# Patient Record
Sex: Male | Born: 1950
Health system: Southern US, Community
[De-identification: ages and names within clinical notes are randomized; demographics above are authoritative.]

## PROBLEM LIST (undated history)

## (undated) DIAGNOSIS — D72819 Decreased white blood cell count, unspecified: Secondary | ICD-10-CM

## (undated) DIAGNOSIS — K279 Peptic ulcer, site unspecified, unspecified as acute or chronic, without hemorrhage or perforation: Secondary | ICD-10-CM

## (undated) DIAGNOSIS — M1990 Unspecified osteoarthritis, unspecified site: Secondary | ICD-10-CM

## (undated) DIAGNOSIS — J40 Bronchitis, not specified as acute or chronic: Secondary | ICD-10-CM

## (undated) DIAGNOSIS — K589 Irritable bowel syndrome without diarrhea: Secondary | ICD-10-CM

## (undated) DIAGNOSIS — N200 Calculus of kidney: Secondary | ICD-10-CM

## (undated) DIAGNOSIS — G4730 Sleep apnea, unspecified: Secondary | ICD-10-CM

## (undated) DIAGNOSIS — Z87891 Personal history of nicotine dependence: Secondary | ICD-10-CM

## (undated) HISTORY — PX: CYSTOGRAPHY: SUR363

## (undated) HISTORY — DX: Personal history of nicotine dependence: Z87.891

## (undated) HISTORY — PX: ROTATOR CUFF REPAIR: SHX139

## (undated) HISTORY — DX: Calculus of kidney: N20.0

## (undated) HISTORY — PX: REFRACTIVE SURGERY: SHX103

## (undated) HISTORY — PX: APPENDECTOMY: SHX54

## (undated) HISTORY — DX: Decreased white blood cell count, unspecified: D72.819

## (undated) HISTORY — DX: Irritable bowel syndrome, unspecified: K58.9

---

## 2004-02-15 ENCOUNTER — Encounter: Admission: RE | Admit: 2004-02-15 | Discharge: 2004-02-15 | Payer: Self-pay | Admitting: Orthopedic Surgery

## 2004-03-08 ENCOUNTER — Encounter: Admission: RE | Admit: 2004-03-08 | Discharge: 2004-03-08 | Payer: Self-pay | Admitting: Orthopedic Surgery

## 2004-03-22 ENCOUNTER — Encounter: Admission: RE | Admit: 2004-03-22 | Discharge: 2004-03-22 | Payer: Self-pay | Admitting: Orthopedic Surgery

## 2004-10-15 ENCOUNTER — Encounter: Admission: RE | Admit: 2004-10-15 | Discharge: 2004-10-15 | Payer: Self-pay | Admitting: Neurosurgery

## 2004-12-04 ENCOUNTER — Ambulatory Visit: Payer: Self-pay | Admitting: Internal Medicine

## 2004-12-04 ENCOUNTER — Ambulatory Visit (HOSPITAL_COMMUNITY): Admission: RE | Admit: 2004-12-04 | Discharge: 2004-12-04 | Payer: Self-pay | Admitting: Internal Medicine

## 2005-06-02 ENCOUNTER — Ambulatory Visit (HOSPITAL_COMMUNITY): Admission: RE | Admit: 2005-06-02 | Discharge: 2005-06-02 | Payer: Self-pay | Admitting: Family Medicine

## 2006-01-30 ENCOUNTER — Emergency Department (HOSPITAL_COMMUNITY): Admission: EM | Admit: 2006-01-30 | Discharge: 2006-01-30 | Payer: Self-pay | Admitting: Emergency Medicine

## 2006-06-02 ENCOUNTER — Ambulatory Visit (HOSPITAL_COMMUNITY): Admission: RE | Admit: 2006-06-02 | Discharge: 2006-06-02 | Payer: Self-pay | Admitting: Family Medicine

## 2006-09-17 ENCOUNTER — Ambulatory Visit (HOSPITAL_COMMUNITY): Admission: RE | Admit: 2006-09-17 | Discharge: 2006-09-17 | Payer: Self-pay | Admitting: Family Medicine

## 2007-10-19 ENCOUNTER — Ambulatory Visit (HOSPITAL_COMMUNITY): Admission: RE | Admit: 2007-10-19 | Discharge: 2007-10-19 | Payer: Self-pay | Admitting: Family Medicine

## 2007-11-03 ENCOUNTER — Ambulatory Visit: Payer: Self-pay | Admitting: Cardiovascular Disease

## 2007-11-09 ENCOUNTER — Ambulatory Visit (HOSPITAL_COMMUNITY): Admission: RE | Admit: 2007-11-09 | Discharge: 2007-11-09 | Payer: Self-pay | Admitting: Cardiovascular Disease

## 2007-11-09 ENCOUNTER — Encounter: Payer: Self-pay | Admitting: Cardiovascular Disease

## 2007-11-09 ENCOUNTER — Ambulatory Visit: Payer: Self-pay | Admitting: Cardiology

## 2009-03-01 DIAGNOSIS — R079 Chest pain, unspecified: Secondary | ICD-10-CM | POA: Insufficient documentation

## 2009-09-07 ENCOUNTER — Encounter (HOSPITAL_COMMUNITY): Admission: RE | Admit: 2009-09-07 | Discharge: 2009-10-07 | Payer: Self-pay | Admitting: Oncology

## 2009-09-07 ENCOUNTER — Ambulatory Visit (HOSPITAL_COMMUNITY): Payer: Self-pay | Admitting: Oncology

## 2009-11-27 ENCOUNTER — Ambulatory Visit (HOSPITAL_COMMUNITY): Admission: RE | Admit: 2009-11-27 | Discharge: 2009-11-27 | Payer: Self-pay | Admitting: Internal Medicine

## 2009-12-06 ENCOUNTER — Ambulatory Visit (HOSPITAL_COMMUNITY): Payer: Self-pay | Admitting: Oncology

## 2009-12-06 ENCOUNTER — Encounter (HOSPITAL_COMMUNITY): Admission: RE | Admit: 2009-12-06 | Discharge: 2010-01-05 | Payer: Self-pay | Admitting: Oncology

## 2010-03-07 ENCOUNTER — Encounter (HOSPITAL_COMMUNITY): Admission: RE | Admit: 2010-03-07 | Discharge: 2010-03-29 | Payer: Self-pay | Admitting: Oncology

## 2010-03-07 ENCOUNTER — Ambulatory Visit (HOSPITAL_COMMUNITY): Payer: Self-pay | Admitting: Oncology

## 2010-09-12 LAB — DIFFERENTIAL
Basophils Relative: 1 % (ref 0–1)
Lymphs Abs: 0.8 10*3/uL (ref 0.7–4.0)
Monocytes Absolute: 0.4 10*3/uL (ref 0.1–1.0)
Monocytes Relative: 10 % (ref 3–12)
Neutro Abs: 2.4 10*3/uL (ref 1.7–7.7)

## 2010-09-12 LAB — CBC
HCT: 42.6 % (ref 39.0–52.0)
Hemoglobin: 14.9 g/dL (ref 13.0–17.0)
MCH: 33.8 pg (ref 26.0–34.0)
MCHC: 34.8 g/dL (ref 30.0–36.0)
RBC: 4.39 MIL/uL (ref 4.22–5.81)

## 2010-09-16 LAB — CBC
Hemoglobin: 15.9 g/dL (ref 13.0–17.0)
MCHC: 34.1 g/dL (ref 30.0–36.0)
MCV: 95.1 fL (ref 78.0–100.0)
RDW: 12.8 % (ref 11.5–15.5)

## 2010-09-16 LAB — DIFFERENTIAL
Basophils Absolute: 0.1 10*3/uL (ref 0.0–0.1)
Basophils Relative: 1 % (ref 0–1)
Eosinophils Absolute: 0.1 10*3/uL (ref 0.0–0.7)
Eosinophils Relative: 3 % (ref 0–5)
Monocytes Absolute: 0.3 10*3/uL (ref 0.1–1.0)
Monocytes Relative: 8 % (ref 3–12)
Neutro Abs: 2.8 10*3/uL (ref 1.7–7.7)

## 2010-09-22 LAB — CBC
MCHC: 34.6 g/dL (ref 30.0–36.0)
MCV: 98.4 fL (ref 78.0–100.0)
Platelets: 220 10*3/uL (ref 150–400)
RDW: 12.5 % (ref 11.5–15.5)

## 2010-09-22 LAB — DIFFERENTIAL
Neutro Abs: 3.7 10*3/uL (ref 1.7–7.7)
Neutrophils Relative %: 79 % — ABNORMAL HIGH (ref 43–77)

## 2010-09-22 LAB — HIV ANTIBODY (ROUTINE TESTING W REFLEX): HIV: NONREACTIVE

## 2010-10-18 ENCOUNTER — Other Ambulatory Visit (HOSPITAL_COMMUNITY): Payer: Self-pay

## 2010-10-21 ENCOUNTER — Ambulatory Visit (HOSPITAL_COMMUNITY): Payer: Self-pay

## 2010-10-22 ENCOUNTER — Encounter (HOSPITAL_COMMUNITY): Payer: Managed Care, Other (non HMO) | Attending: Oncology

## 2010-10-22 DIAGNOSIS — Z79899 Other long term (current) drug therapy: Secondary | ICD-10-CM | POA: Insufficient documentation

## 2010-10-22 DIAGNOSIS — D72819 Decreased white blood cell count, unspecified: Secondary | ICD-10-CM

## 2010-10-22 DIAGNOSIS — F411 Generalized anxiety disorder: Secondary | ICD-10-CM | POA: Insufficient documentation

## 2010-10-22 DIAGNOSIS — E291 Testicular hypofunction: Secondary | ICD-10-CM | POA: Insufficient documentation

## 2010-10-24 ENCOUNTER — Encounter (HOSPITAL_COMMUNITY): Payer: Managed Care, Other (non HMO)

## 2010-10-24 DIAGNOSIS — D72819 Decreased white blood cell count, unspecified: Secondary | ICD-10-CM

## 2010-11-06 ENCOUNTER — Emergency Department (HOSPITAL_COMMUNITY): Payer: Managed Care, Other (non HMO)

## 2010-11-06 ENCOUNTER — Emergency Department (HOSPITAL_COMMUNITY)
Admission: EM | Admit: 2010-11-06 | Discharge: 2010-11-06 | Disposition: A | Discharge status: Home / Self Care | Payer: Managed Care, Other (non HMO) | Attending: Emergency Medicine | Admitting: Emergency Medicine

## 2010-11-06 DIAGNOSIS — IMO0002 Reserved for concepts with insufficient information to code with codable children: Secondary | ICD-10-CM | POA: Insufficient documentation

## 2010-11-06 DIAGNOSIS — Y998 Other external cause status: Secondary | ICD-10-CM | POA: Insufficient documentation

## 2010-11-06 DIAGNOSIS — X58XXXA Exposure to other specified factors, initial encounter: Secondary | ICD-10-CM | POA: Insufficient documentation

## 2010-11-06 DIAGNOSIS — H409 Unspecified glaucoma: Secondary | ICD-10-CM | POA: Insufficient documentation

## 2010-11-06 DIAGNOSIS — Y9317 Activity, water skiing and wake boarding: Secondary | ICD-10-CM | POA: Insufficient documentation

## 2010-11-12 NOTE — Procedures (Signed)
Joel Greene, Joel Greene                 ACCOUNT NO.:  [id number]   MEDICAL RECORD NO.:  [id number]          PATIENT TYPE:  OUT   LOCATION:  RAD                           FACILITY:  [facility]   PHYSICIAN:  Gerrit Friends. Dietrich Pates, MD, FACCDATE OF BIRTH:  10/09/1950   DATE OF PROCEDURE:  11/09/2007  DATE OF DISCHARGE:  11/09/2007                                ECHOCARDIOGRAM   CLINICAL DATA:  This is a 60 year old gentleman with chest pain.   1. Treadmill exercise performed to a workload of 12.8 METS and a heart      rate of 171, 106% of age - predicted maximum.  Exercise      discontinued due to dyspnea and fatigue; no chest pain reported.  2. Blood pressure increased from a resting value of 140/85 to 180/80      during exercise and 190/80 in recovery, a normal response.  3. No arrhythmias noted.  4. EKG:  Normal sinus rhythm; slightly delayed R wave progression;      otherwise normal.  5. Stress EKG:  No significant change.  6. Baseline echocardiogram:  Normal left atrial size; normal right      ventricular size and function; normal left ventricular size and      function - no LVH.  Normal mitral valve with mild annular      calcification; normal aortic valve and aortic root.  7. Stress echocardiogram:  Marked decrease in cavity size with an      increase in contractility in all myocardial segments.   IMPRESSION:  Negative and adequate stress echocardiogram revealing  neither electrocardiographic nor echocardiographic evidence for  myocardial infarction or ischemia.  Other findings are as noted.      Gerrit Friends. Dietrich Pates, MD, [facility]  Electronically Signed     RMR/MEDQ  D:  11/10/2007  T:  11/11/2007  Job:  [id number]

## 2010-11-12 NOTE — Assessment & Plan Note (Signed)
[facility]                       [place] CARDIOLOGY OFFICE NOTE   Joel Greene, Joel Greene                        MRN:          [id number]  DATE:11/03/2007                            DOB:          08-12-1950    Joel Greene is a pleasant 60 year old patient referred by Joel Greene and Dr. Phillips Odor for chest pain.  The patient has no  previously-documented coronary history.  About a month ago he was  lifting about a 60-pound package over his head at [employer].  He  had severe chest pain.  He saw his physician.  He initially was treated  with over-the-counter medicines and about 2 weeks ago started Mobic.  The pain was clearly musculoskeletal.  It hurt almost continuously for 4  weeks.  The Mobic helped greatly and he is currently asymptomatic.   The patient has not had exertional chest pain outside of this.  He is a  fairly avid hunter and active and does not get exertional pain.  There  have been no palpitations, syncope.  He has not had a pleuritic  component.  There is been no history of PE or DVT.   He has not had a previous stress test but was referred here for one.   His cardiac risk factors are negative for smoking, diabetes, family  history.  I do not have a cholesterol status on him .  he is on no blood  pressure medicines.   The patient's review of systems is otherwise negative.   His past medical history is benign.  He has had bilateral rotator cuff  surgery by Dr. Thurston Hole.  He has had a previous appendectomy and  colonoscopy.   He has no known allergies.   He does not smoke or drink.   He takes no medicines outside of Xanax for sleep.   REVIEW OF SYSTEMS:  Otherwise remarkable for wearing glasses.  He has  seen Dr. Victorino Dike in the past for what sounds like some reflux but  has not seen him recently.   The patient is divorced.  He has an older daughter.  He is a grandfather  of a 18 year old.  He enjoys  hunting and water skiing.  He works at  [employer] as a Pensions consultant.   FAMILY HISTORY:  Remarkable for father dying of complications of knee  surgery, mother still being alive.   He denies any allergies.   EXAM:  Remarkable for a jovial, middle-aged white male in no distress.  He looks younger than his stated age.  Blood pressure is 130/80, weight 196, pulse 73 and regular, respiratory  rate 14.  HEENT:  Unremarkable.  Carotids normal without bruit.  No lymphadenopathy, thyromegaly or JVP  elevation.  LUNGS:  Clear with good diaphragmatic motion.  No wheezing.  S1 with a systolic ejection murmur.  PMI normal.  No pain to palpation  in the chest.  ABDOMEN:  Benign.  Bowel sounds positive.  No bruit.  No tenderness.  No  hepatosplenomegaly or hepatojugular reflux.  Distal pulses are intact.  No edema.  NEUROLOGIC: Nonfocal.  SKIN:  Warm and dry.  No muscular weakness.  He is status post bilateral rotator cuff surgery  and seems to have had some stitches in the left antecubital fossa.   IMPRESSION:  1. Chest pain, likely musculoskeletal.  Follow up stress echo.  I did      review his EKG today.  it was normal except for some minor lateral      T-wave changes.  2. Murmur.  Since the patient has had chest pain, I thought the best      test for him would be a stress echo.  Resting images will allow [place]      to interrogate the etiology of his systolic murmur.  3. Anxiety and sleep disorder.  Continue p.r.n. Xanax per Dr. Phillips Odor  4. Bilateral rotator cuff problems.  Follow up with Dr. Wyline Mood.      Continue range of motion exercises.   Overall, I think the patient will have a negative stress echo.  I  suspect his murmur is that of aortic sclerosis.  I will see him on a  p.r.n. basis if his stress echo is normal.     Theron Arista C. Eden Emms, MD, [facility]  Electronically Signed    PCN/MedQ  DD: 11/03/2007  DT: 11/03/2007  Job #: [id number]   cc:   [facility]

## 2010-11-15 NOTE — Op Note (Signed)
Joel Greene, Joel Greene                 ACCOUNT NO.:  [id number]   MEDICAL RECORD NO.:  [id number]          PATIENT TYPE:  AMB   LOCATION:  DAY                           FACILITY:  [facility]   PHYSICIAN:  Lionel December, M.D.    DATE OF BIRTH:  May 17, 1951   DATE OF PROCEDURE:  12/04/2004  DATE OF DISCHARGE:                                 OPERATIVE REPORT   PROCEDURE:  Colonoscopy with polypectomy.   INDICATION:  Brae is a 60 year old Caucasian male who is here for screening  colonoscopy. Family history is negative for colorectal carcinoma. Procedure  risks were reviewed with the patient, informed consent was obtained.   PREMEDICATION:  Demerol 50 milligrams IV, Versed 5 milligrams IV in divided  dose.   FINDINGS:  Procedure performed in endoscopy suite.  The patient's vital  signs and O2 sat were monitored during procedure and remained stable. The  patient was placed left lateral position. Rectal examination performed. No  abnormality noted on external or digital exam. Olympus videoscope was placed  in rectum and advanced under vision to sigmoid colon beyond. Preparation was  excellent except the ascending colon where he had thick stool coating the  mucosa but there did not appear to be any underlying lesion. Cecum was  identified by ileocecal valve. Blunt cecum was well seen although the mucosa  was coated with stool, it did not appear to have any polyp or mass.  As the  scope was withdrawn, colonic mucosa was carefully examined. There was a  small polyp at hepatic flexure which was cold snared. Another 7-8 mm polyp  was snared from sigmoid colon.  It had red hemorrhagic surface. This polyp  was retrieved for histologic examination. Mucosa rest of the sigmoid colon  and rectum was normal.  The scope was retroflexed to examine anorectal  junction and he had small hemorrhoids below the dentate line. Endoscope was  straightened and withdrawn. The patient tolerated the procedure well.   FINAL  DIAGNOSIS:  Small polyp was cold snared from hepatic flexure.  Another  7 to 8 mm polyp snared from sigmoid colon.  Small external hemorrhoids.   RECOMMENDATIONS:  Standard instructions given.  I will be contacting the  patient with biopsy results and further recommendations.       NR/MEDQ  D:  12/04/2004  T:  12/04/2004  Job:  [id number]   cc:   Ramon Dredge L. Juanetta Gosling, M.D.  [address]  [place]  [place] [address]  Fax: [phone]

## 2011-08-21 ENCOUNTER — Other Ambulatory Visit (HOSPITAL_COMMUNITY): Payer: Self-pay | Admitting: Urology

## 2011-08-21 DIAGNOSIS — R310 Gross hematuria: Secondary | ICD-10-CM

## 2011-08-26 ENCOUNTER — Ambulatory Visit (HOSPITAL_COMMUNITY)
Admission: RE | Admit: 2011-08-26 | Discharge: 2011-08-26 | Disposition: A | Discharge status: Home / Self Care | Payer: Managed Care, Other (non HMO) | Source: Ambulatory Visit | Attending: Urology | Admitting: Urology

## 2011-08-26 DIAGNOSIS — R310 Gross hematuria: Secondary | ICD-10-CM | POA: Insufficient documentation

## 2011-08-26 DIAGNOSIS — N200 Calculus of kidney: Secondary | ICD-10-CM | POA: Insufficient documentation

## 2011-08-26 MED ORDER — IOHEXOL 300 MG/ML  SOLN
100.0000 mL | Freq: Once | INTRAMUSCULAR | Status: AC | PRN
  Administered 2011-08-26: 100 mL via INTRAVENOUS

## 2011-10-20 ENCOUNTER — Encounter (HOSPITAL_COMMUNITY): Payer: Managed Care, Other (non HMO) | Attending: Oncology

## 2011-10-20 DIAGNOSIS — Z87442 Personal history of urinary calculi: Secondary | ICD-10-CM | POA: Insufficient documentation

## 2011-10-20 DIAGNOSIS — E291 Testicular hypofunction: Secondary | ICD-10-CM | POA: Insufficient documentation

## 2011-10-20 DIAGNOSIS — D72819 Decreased white blood cell count, unspecified: Secondary | ICD-10-CM

## 2011-10-20 DIAGNOSIS — H409 Unspecified glaucoma: Secondary | ICD-10-CM | POA: Insufficient documentation

## 2011-10-20 DIAGNOSIS — K589 Irritable bowel syndrome without diarrhea: Secondary | ICD-10-CM | POA: Insufficient documentation

## 2011-10-20 DIAGNOSIS — F411 Generalized anxiety disorder: Secondary | ICD-10-CM | POA: Insufficient documentation

## 2011-10-20 DIAGNOSIS — Z87891 Personal history of nicotine dependence: Secondary | ICD-10-CM | POA: Insufficient documentation

## 2011-10-20 LAB — DIFFERENTIAL
Eosinophils Absolute: 0.1 10*3/uL (ref 0.0–0.7)
Lymphs Abs: 0.7 10*3/uL (ref 0.7–4.0)
Monocytes Relative: 14 % — ABNORMAL HIGH (ref 3–12)
Neutrophils Relative %: 62 % (ref 43–77)

## 2011-10-20 LAB — CBC
Hemoglobin: 14.4 g/dL (ref 13.0–17.0)
MCH: 29.8 pg (ref 26.0–34.0)
MCV: 92.3 fL (ref 78.0–100.0)
RBC: 4.83 MIL/uL (ref 4.22–5.81)

## 2011-10-20 NOTE — Progress Notes (Signed)
Labs drawn today for cbc/diff 

## 2011-10-23 ENCOUNTER — Encounter (HOSPITAL_BASED_OUTPATIENT_CLINIC_OR_DEPARTMENT_OTHER): Payer: Managed Care, Other (non HMO) | Admitting: Oncology

## 2011-10-23 ENCOUNTER — Encounter (HOSPITAL_COMMUNITY): Payer: Self-pay | Admitting: Oncology

## 2011-10-23 VITALS — BP 139/80 | HR 87 | Temp 98.7°F | Wt 196.7 lb

## 2011-10-23 DIAGNOSIS — Z87891 Personal history of nicotine dependence: Secondary | ICD-10-CM

## 2011-10-23 DIAGNOSIS — D72819 Decreased white blood cell count, unspecified: Secondary | ICD-10-CM

## 2011-10-23 HISTORY — DX: Decreased white blood cell count, unspecified: D72.819

## 2011-10-23 HISTORY — DX: Personal history of nicotine dependence: Z87.891

## 2011-10-23 NOTE — Progress Notes (Signed)
[name]., MD, MD [address] [place] [address]  1. Leukopenia   2. History of tobacco abuse     CURRENT THERAPY: Observation  INTERVAL HISTORY: Joel Greene 60 y.o. male returns for  regular  visit for followup of leukopenia with a decrease in lymphocytes dating back to 1995. This is likely hereditary and will not require any intervention.  We've been following the patient's CBC for a few years. His white count remains very stable. His most recent laboratory work reveals a mild leukopenia with a normal differential. I personally reviewed and went over laboratory results with the patient.  The patient reports he underwent a cystoscopy and required antibiotics prophylactically for that procedure. Otherwise he has not required any antibiotics for any infection in a few years.  The patient denies any complaints. He denies any abdominal pain or early satiety. He denies any fevers, chills, night sweats, weight loss, appetite changes. He is doing very well.  The patient requests to be released from the clinic which I think is a reasonable option. We are simply will observing his laboratory work and no intervention has been required since we met the patient back in 2011. Thus, we'll release the patient from the clinic. He understands that we are available to him if he ever requires our services.   Past Medical History  Diagnosis Date  . IBS (irritable bowel syndrome)   . Renal calculi   . Glaucoma   . Leukopenia 10/23/2011    Decrease in lymphocytes dating back to 1995.  Marland Kitchen History of tobacco abuse 10/23/2011    History of smoking for 10 years, a pack per day, quitting at age 6.    has CHEST PAIN UNSPECIFIED; Leukopenia; and History of tobacco abuse on his problem list.     is allergic to tylenol.  Joel Greene does not currently have medications on file.  Past Surgical History  Procedure Date  . Cystography   . Appendectomy   . Rotator cuff repair       bilateral shoulders  . Refractive surgery     "to relieve glaucoma"    Denies any headaches, dizziness, double vision, fevers, chills, night sweats, nausea, vomiting, diarrhea, constipation, chest pain, heart palpitations, shortness of breath, blood in stool, black tarry stool, urinary pain, urinary burning, urinary frequency, hematuria.   PHYSICAL EXAMINATION  ECOG PERFORMANCE STATUS: 0 - Asymptomatic  Filed Vitals:   10/23/11 0935  BP: 139/80  Pulse: 87  Temp: 98.7 F (37.1 C)    GENERAL:alert, no distress, well nourished, well developed, comfortable, cooperative and smiling SKIN: skin color, texture, turgor are normal, no rashes or significant lesions HEAD: Normocephalic, No masses, lesions, tenderness or abnormalities EYES: normal, EOMI, Conjunctiva are pink and non-injected EARS: External ears normal OROPHARYNX:lips, buccal mucosa, and tongue normal and mucous membranes are moist  NECK: supple, trachea midline LYMPH:  no hepatosplenomegaly BREAST:not examined LUNGS: clear to auscultation and percussion HEART: regular rate & rhythm, no murmurs, no gallops, S1 normal and S2 normal ABDOMEN:abdomen soft, non-tender, normal bowel sounds, no masses or organomegaly and no hepatosplenomegaly BACK: Back symmetric, no curvature., No CVA tenderness EXTREMITIES:less then 2 second capillary refill, no joint deformities, effusion, or inflammation, no edema, no skin discoloration, no clubbing, no cyanosis  NEURO: alert & oriented x 3 with fluent speech, no focal motor/sensory deficits, gait normal  LABORATORY DATA: CBC    Component Value Date/Time   WBC 3.3* 10/20/2011 1057   RBC 4.83 10/20/2011 1057  HGB 14.4 10/20/2011 1057   HCT 44.6 10/20/2011 1057   PLT 265 10/20/2011 1057   MCV 92.3 10/20/2011 1057   MCH 29.8 10/20/2011 1057   MCHC 32.3 10/20/2011 1057   RDW 13.3 10/20/2011 1057   LYMPHSABS 0.7 10/20/2011 1057   MONOABS 0.5 10/20/2011 1057   EOSABS 0.1 10/20/2011 1057    BASOSABS 0.1 10/20/2011 1057    In August of 2011, the patient's white blood cell count was 3.7 with a normal differential. In April 2012, white count 3.1 with a normal differential and most recently, white count of 3.3 with again a normal differential.    ASSESSMENT:  1. Leukopenia with a decrease in lymphocytes dating back to 1995. 2. History of appendectomy in 1966. 3. Hypogonadism on testosterone therapy via implant by Dr. Milinda Cave in [place] 4. Colonoscopy by Dr. Karilyn Cota 2009. 5. Bilateral rotator cuff shoulder repairs by Dr. Hadassah Pais in 2004 and 2005 6. 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 Use of one to 2 glasses of wine 3 to time per week 8. Chronic anxiety on Xanax 9. History of smoking for 10 years, one pack per day, quitting at age 89.   PLAN:  1. I personally reviewed and went over laboratory results with the patient. 2. At this time, the patient be released from the clinic. His leukopenia is very stable and as noted back to at least 1995. He has not required any increased antibiotic usage. He denies any infections. The patient understands that if he ever requires our services in the future we be happy to see him again. I recommend the patient follow up with his primary care physician.   All questions were answered. The patient knows to call the clinic with any problems, questions or concerns. We can certainly see the patient much sooner if necessary.   Nikkol Pai

## 2011-10-23 NOTE — Patient Instructions (Signed)
Joel Greene  [id number] 1950-07-25 Dr. Glenford Peers  [facility]  Discharge Instructions  RECOMMENDATIONS MADE BY THE CONSULTANT AND ANY TEST RESULTS WILL BE SENT TO YOUR REFERRING DOCTOR.   Released from clinic. If you should ever need to see [place] again in the future have you primary care physician contact us for an appointment.   I acknowledge that I have been informed and understand all the instructions given to me and received a copy. I do not have any more questions at this time, but understand that I may call the Specialty Clinic at [facility] at [phone] during business hours should I have any further questions or need assistance in obtaining follow-up care.    __________________________________________  _____________  __________ Signature of Patient or Authorized Representative            Date                   Time    __________________________________________ Nurse's Signature

## 2011-12-27 ENCOUNTER — Encounter (HOSPITAL_COMMUNITY): Payer: Self-pay | Admitting: Emergency Medicine

## 2011-12-27 ENCOUNTER — Emergency Department (HOSPITAL_COMMUNITY)
Admission: EM | Admit: 2011-12-27 | Discharge: 2011-12-27 | Disposition: A | Discharge status: Home / Self Care | Payer: Managed Care, Other (non HMO) | Attending: Emergency Medicine | Admitting: Emergency Medicine

## 2011-12-27 DIAGNOSIS — J029 Acute pharyngitis, unspecified: Secondary | ICD-10-CM

## 2011-12-27 DIAGNOSIS — Z87891 Personal history of nicotine dependence: Secondary | ICD-10-CM | POA: Insufficient documentation

## 2011-12-27 DIAGNOSIS — K589 Irritable bowel syndrome without diarrhea: Secondary | ICD-10-CM | POA: Insufficient documentation

## 2011-12-27 DIAGNOSIS — H409 Unspecified glaucoma: Secondary | ICD-10-CM | POA: Insufficient documentation

## 2011-12-27 DIAGNOSIS — Z79899 Other long term (current) drug therapy: Secondary | ICD-10-CM | POA: Insufficient documentation

## 2011-12-27 DIAGNOSIS — J329 Chronic sinusitis, unspecified: Secondary | ICD-10-CM | POA: Insufficient documentation

## 2011-12-27 HISTORY — DX: Bronchitis, not specified as acute or chronic: J40

## 2011-12-27 MED ORDER — TAPENTADOL HCL 100 MG PO TABS: ORAL_TABLET | ORAL | Status: DC

## 2011-12-27 MED ORDER — PENICILLIN V POTASSIUM 500 MG PO TABS: ORAL_TABLET | ORAL | Status: DC

## 2011-12-27 MED ORDER — FIRST-DUKES MOUTHWASH MT SUSP: OROMUCOSAL | Status: DC

## 2011-12-27 NOTE — ED Provider Notes (Signed)
Medical screening examination/treatment/procedure(s) were performed by non-physician practitioner and as supervising physician I was immediately available for consultation/collaboration.  Devoria Albe, MD, Armando Gang   Ward Givens, MD 12/27/11 [phone]

## 2011-12-27 NOTE — ED Notes (Signed)
Patient c/o headache behind left eye, productive cough, and sore throat. Per patient blister roof of mouth that he "popped' by rubbing tongue across them, now he rep[orts a raw feeling. Patient used duke's magic mouth wash in which he reported some improvement. Patient now out of mouth wash. Sputum thick green per patient.

## 2011-12-27 NOTE — Discharge Instructions (Signed)
Please wash hands frequently. Please increase fluids. Penicillin daily. Please use Sudafed (over-the-counter) for congestion in the nasal passages and sinuses. Please use the mouthwash and pain medicine as prescribed. Please see your physician next week for recheck.

## 2011-12-27 NOTE — ED Notes (Signed)
Pt c/o mouth pain, head pain/pressure, and productive cough with green sputum. Pt states this has been going on a couple of days.

## 2011-12-27 NOTE — ED Provider Notes (Signed)
History     CSN: [id number]  Arrival date & time 12/27/11  0945   First MD Initiated Contact with Patient 12/27/11 1016      Chief Complaint  Patient presents with  . Headache  . Sore Throat  . Cough    (Consider location/radiation/quality/duration/timing/severity/associated sxs/prior treatment) HPI Comments: Patient is history of 4-5 days of sore throat, cough, and headache. The patient describes the headache is being behind his left eye. He can describes it as being a pressure type sensation. He's not had any injury to the head. This was no loss of consciousness. There no vision changes reported, nor any speech changes. The patient complains of sore throat. States that he has had some blisters in the roof of his mouth that popped out with aggravation or irritation from his tongue. He has been using dual is Magic mouthwash with some improvement but ran out of this medication and presents to the emergency department to get some of this medication today. The patient also complains of cough, with productive green phlegm. The patient has not measured her temperature but states he feels he has been feverish. He is not coughing up any blood.  Patient is a 61 y.o. male presenting with headaches, pharyngitis, and cough. The history is provided by the patient.  Headache  Pertinent negatives include no palpitations and no shortness of breath.  Sore Throat Associated symptoms include coughing, headaches and a sore throat. Pertinent negatives include no abdominal pain, arthralgias, chest pain or neck pain.  Cough Associated symptoms include headaches and sore throat. Pertinent negatives include no chest pain, no shortness of breath and no wheezing.    Past Medical History  Diagnosis Date  . IBS (irritable bowel syndrome)   . Renal calculi   . Glaucoma   . Leukopenia 10/23/2011    Decrease in lymphocytes dating back to 1995.  Marland Kitchen History of tobacco abuse 10/23/2011    History of smoking for 10  years, a pack per day, quitting at age 34.  . Bronchitis     Past Surgical History  Procedure Date  . Cystography   . Appendectomy   . Rotator cuff repair     bilateral shoulders  . Refractive surgery     "to relieve glaucoma"    Family History  Problem Relation Age of Onset  . Cancer Mother     History  Substance Use Topics  . Smoking status: Former Smoker -- 2.0 packs/day for 15 years    Types: Cigarettes    Quit date: 07/01/1983  . Smokeless tobacco: Never Used  . Alcohol Use: Yes     occasional      Review of Systems  Constitutional: Negative for activity change.       All ROS Neg except as noted in HPI  HENT: Positive for sore throat and postnasal drip. Negative for nosebleeds and neck pain.   Eyes: Negative for photophobia and discharge.  Respiratory: Positive for cough. Negative for shortness of breath and wheezing.   Cardiovascular: Negative for chest pain and palpitations.  Gastrointestinal: Negative for abdominal pain and blood in stool.  Genitourinary: Negative for dysuria, frequency and hematuria.  Musculoskeletal: Negative for back pain and arthralgias.  Skin: Negative.   Neurological: Positive for headaches. Negative for dizziness, seizures and speech difficulty.  Psychiatric/Behavioral: Negative for hallucinations and confusion.    Allergies  Clams and Tylenol  Home Medications   Current Outpatient Rx  Name Route Sig Dispense Refill  . ALPRAZOLAM 1 MG PO  TABS Oral Take 1 mg by mouth at bedtime as needed.    Marland Kitchen MAGIC MOUTHWASH Oral Take 5 mLs by mouth 4 (four) times daily as needed. For mouth pain    . GOODY HEADACHE PO Oral Take by mouth as needed.    . ADULT MULTIVITAMIN W/MINERALS CH Oral Take 1 tablet by mouth daily.    Marland Kitchen PROMETHAZINE HCL 25 MG PO TABS Oral Take 25 mg by mouth every 6 (six) hours as needed. For nausea    . TAPENTADOL HCL 100 MG PO TABS Oral Take 1 tablet by mouth every 4 (four) hours as needed. For mouth pain    . ZOLPIDEM  TARTRATE 10 MG PO TABS Oral Take 10 mg by mouth at bedtime as needed.    Marland Kitchen FIRST-DUKES MOUTHWASH MT SUSP  Swish and spit 10ml tid 200 mL 0  . PENICILLIN V POTASSIUM 500 MG PO TABS  2 po bid with food 28 tablet 0  . TAPENTADOL HCL 100 MG PO TABS  1 po q6h prn pain 15 tablet 0    BP 128/84  Pulse 121  Temp 99 F (37.2 C) (Oral)  Resp 16  Ht 5\' 10"  (1.778 m)  Wt 195 lb (88.451 kg)  BMI 27.98 kg/m2  SpO2 97%  Physical Exam  Nursing note and vitals reviewed. Constitutional: He is oriented to person, place, and time. He appears well-developed and well-nourished.  Non-toxic appearance.  HENT:  Head: Normocephalic.  Right Ear: Tympanic membrane and external ear normal.  Left Ear: Tympanic membrane and external ear normal.       Tympanic membranes within normal limits bilaterally. Increased redness of the posterior pharynx. Uvula is swollen. Airway is patent. Speech is clear.  Eyes: EOM and lids are normal. Pupils are equal, round, and reactive to light.  Neck: Normal range of motion. Neck supple. Carotid bruit is not present.  Cardiovascular: Regular rhythm, normal heart sounds, intact distal pulses and normal pulses.  Tachycardia present.   Pulmonary/Chest: Breath sounds normal. No respiratory distress.  Abdominal: Soft. Bowel sounds are normal. There is no tenderness. There is no guarding.  Musculoskeletal: Normal range of motion.  Lymphadenopathy:       Head (right side): No submandibular adenopathy present.       Head (left side): No submandibular adenopathy present.    He has no cervical adenopathy.  Neurological: He is alert and oriented to person, place, and time. He has normal strength. A cranial nerve deficit is present. No sensory deficit.  Skin: Skin is warm and dry. No rash noted.  Psychiatric: He has a normal mood and affect. His speech is normal.    ED Course  Procedures (including critical care time)  Labs Reviewed - No data to display No results found.   No  diagnosis found.    MDM  I have reviewed nursing notes, vital signs, and all appropriate lab and imaging results for this patient. Patient's temperature is measured at 99. Pulse rate elevated at 121. Patient has a sinusitis and a pharyngitis. Patient advised to continue his Duke's Magic mouth wash. Prescription for penicillin also given to the patient.Rx for Tapentadol q6h #15 given to the patient.   He is to follow up with his primary physician for recheck.       Kathie Dike, [place] 12/27/11 1114

## 2011-12-28 LAB — STREP A DNA PROBE: Group A Strep Probe: NEGATIVE

## 2012-07-05 ENCOUNTER — Encounter (INDEPENDENT_AMBULATORY_CARE_PROVIDER_SITE_OTHER): Payer: Self-pay | Admitting: *Deleted

## 2012-07-20 ENCOUNTER — Other Ambulatory Visit (INDEPENDENT_AMBULATORY_CARE_PROVIDER_SITE_OTHER): Payer: Self-pay | Admitting: *Deleted

## 2012-07-20 ENCOUNTER — Telehealth (INDEPENDENT_AMBULATORY_CARE_PROVIDER_SITE_OTHER): Payer: Self-pay | Admitting: *Deleted

## 2012-07-20 DIAGNOSIS — Z1211 Encounter for screening for malignant neoplasm of colon: Secondary | ICD-10-CM

## 2012-07-20 DIAGNOSIS — Z86010 Personal history of colonic polyps: Secondary | ICD-10-CM

## 2012-07-20 MED ORDER — PEG-KCL-NACL-NASULF-NA ASC-C 100 G PO SOLR: 1.0000 | Freq: Once | ORAL | Status: DC

## 2012-07-20 NOTE — Telephone Encounter (Signed)
Patient needs movi prep 

## 2012-08-04 ENCOUNTER — Telehealth (INDEPENDENT_AMBULATORY_CARE_PROVIDER_SITE_OTHER): Payer: Self-pay | Admitting: *Deleted

## 2012-08-04 NOTE — Telephone Encounter (Signed)
  Procedure: tcs  Reason/Indication:  Hx polyps  Has patient had this procedure before?  Yes, 2009 (scanned)  If so, when, by whom and where?    Is there a family history of colon cancer?  no  Who?  What age when diagnosed?    Is patient diabetic?   no      Does patient have prosthetic heart valve?  no  Do you have a pacemaker?  no  Has patient had joint replacement within last 12 months?  no  Is patient on Coumadin, Plavix and/or Aspirin? yes  Medications: goody powder prn, xanax 1 mg prn, ambien 10 mg prn, phenergan 25 mg prn, flexeril prn  Allergies: nkda  Medication Adjustment: goody powder 2 days  Procedure date & time: 09/01/12 at 1030

## 2012-08-04 NOTE — Telephone Encounter (Signed)
agree

## 2012-08-16 ENCOUNTER — Encounter (HOSPITAL_COMMUNITY): Payer: Self-pay | Admitting: Pharmacy Technician

## 2012-09-01 ENCOUNTER — Encounter (HOSPITAL_COMMUNITY)
Admission: RE | Disposition: A | Discharge status: Home / Self Care | Payer: Self-pay | Source: Ambulatory Visit | Attending: Internal Medicine

## 2012-09-01 ENCOUNTER — Ambulatory Visit (HOSPITAL_COMMUNITY)
Admission: RE | Admit: 2012-09-01 | Discharge: 2012-09-01 | Disposition: A | Discharge status: Home / Self Care | Payer: Managed Care, Other (non HMO) | Source: Ambulatory Visit | Attending: Internal Medicine | Admitting: Internal Medicine

## 2012-09-01 ENCOUNTER — Encounter (HOSPITAL_COMMUNITY): Payer: Self-pay | Admitting: *Deleted

## 2012-09-01 DIAGNOSIS — Z860100 Personal history of colon polyps, unspecified: Secondary | ICD-10-CM

## 2012-09-01 DIAGNOSIS — K5730 Diverticulosis of large intestine without perforation or abscess without bleeding: Secondary | ICD-10-CM

## 2012-09-01 DIAGNOSIS — D126 Benign neoplasm of colon, unspecified: Secondary | ICD-10-CM | POA: Insufficient documentation

## 2012-09-01 HISTORY — PX: COLONOSCOPY: SHX5424

## 2012-09-01 SURGERY — COLONOSCOPY
Anesthesia: Moderate Sedation

## 2012-09-01 MED ORDER — MIDAZOLAM HCL 5 MG/5ML IJ SOLN
INTRAMUSCULAR | Status: DC | PRN
  Administered 2012-09-01: 2 mg via INTRAVENOUS
  Administered 2012-09-01: 3 mg via INTRAVENOUS
  Administered 2012-09-01: 2 mg via INTRAVENOUS
  Administered 2012-09-01: 3 mg via INTRAVENOUS

## 2012-09-01 MED ORDER — MIDAZOLAM HCL 5 MG/5ML IJ SOLN
INTRAMUSCULAR | Status: AC
  Filled 2012-09-01: qty 10

## 2012-09-01 MED ORDER — STERILE WATER FOR IRRIGATION IR SOLN
Status: DC | PRN
  Administered 2012-09-01: 11:00:00

## 2012-09-01 MED ORDER — SODIUM CHLORIDE 0.45 % IV SOLN
INTRAVENOUS | Status: DC
  Administered 2012-09-01: 1000 mL via INTRAVENOUS

## 2012-09-01 MED ORDER — MEPERIDINE HCL 50 MG/ML IJ SOLN
INTRAMUSCULAR | Status: DC | PRN
  Administered 2012-09-01 (×2): 25 mg via INTRAVENOUS

## 2012-09-01 MED ORDER — MEPERIDINE HCL 50 MG/ML IJ SOLN
INTRAMUSCULAR | Status: AC
  Filled 2012-09-01: qty 1

## 2012-09-01 NOTE — H&P (Signed)
Joel Greene is an 62 y.o. male.   Chief Complaint: Patient is here for colonoscopy. HPI: Patient is 34-year-old Caucasian male with history of colonic adenomas who is here for surveillance colonoscopy. Patient denies abdominal pain rectal bleeding or change in his bowel habits. Family history is negative for colorectal carcinoma.  Past Medical History  Diagnosis Date  . IBS (irritable bowel syndrome)   . Renal calculi   . Glaucoma(365)   . Leukopenia 10/23/2011    Decrease in lymphocytes dating back to 1995.  Marland Kitchen History of tobacco abuse 10/23/2011    History of smoking for 10 years, a pack per day, quitting at age 56.  . Bronchitis     Past Surgical History  Procedure Laterality Date  . Cystography    . Appendectomy    . Rotator cuff repair      bilateral shoulders  . Refractive surgery      "to relieve glaucoma"    Family History  Problem Relation Age of Onset  . Breast cancer Mother   . Kidney Stones Brother    Social History:  reports that he quit smoking about 29 years ago. His smoking use included Cigarettes. He has a 30 pack-year smoking history. He has never used smokeless tobacco. He reports that  drinks alcohol. He reports that he does not use illicit drugs.  Allergies:  Allergies  Allergen Reactions  . Clams (Shellfish Allergy) Nausea And Vomiting  . Tetracyclines & Related Rash  . Tylenol (Acetaminophen) Rash    Medications Prior to Admission  Medication Sig Dispense Refill  . ALPRAZolam (XANAX) 1 MG tablet Take 1 mg by mouth at bedtime as needed for anxiety.       . Aspirin-Acetaminophen-Caffeine (GOODY HEADACHE PO) Take 1 Package by mouth as needed (headache).       . Multiple Vitamin (MULITIVITAMIN WITH MINERALS) TABS Take 1 tablet by mouth daily.      . peg 3350 powder (MOVIPREP) 100 G SOLR Take 1 kit (100 g total) by mouth once.  1 kit  0  . zolpidem (AMBIEN) 10 MG tablet Take 10 mg by mouth at bedtime as needed for sleep.       Marland Kitchen Alum & Mag  Hydroxide-Simeth (MAGIC MOUTHWASH) SOLN Take 5 mLs by mouth 4 (four) times daily as needed. For mouth pain        No results found for this or any previous visit (from the past 48 hour(s)). No results found.  ROS  Blood pressure 160/88, pulse 60, temperature 97.6 F (36.4 C), temperature source Oral, resp. rate 18, height 5\' 10"  (1.778 m), weight 190 lb (86.183 kg), SpO2 94.00%. Physical Exam  Constitutional: He appears well-developed and well-nourished.  HENT:  Mouth/Throat: Oropharynx is clear and moist.  Eyes: Conjunctivae are normal. No scleral icterus.  Neck: No thyromegaly present.  Cardiovascular: Normal rate, regular rhythm and normal heart sounds.   No murmur heard. Respiratory: Effort normal and breath sounds normal.  GI: He exhibits no distension and no mass. There is no tenderness.  Musculoskeletal: He exhibits no edema.  Lymphadenopathy:    He has no cervical adenopathy.  Neurological: He is alert.  Skin: Skin is warm and dry.     Assessment/Plan History of colonic adenomas. Surveillance colonoscopy.  REHMAN,NAJEEB U 09/01/2012, 10:36 AM

## 2012-09-01 NOTE — Op Note (Signed)
COLONOSCOPY PROCEDURE REPORT  PATIENT:  Joel Greene  MR#:  [id number] Birthdate:  [birth year], 62 y.o., male Endoscopist:  Dr. Malissa Hippo, MD Referred By:  Dr. Madelin Rear. Sherwood Gambler, MD Procedure Date: 09/01/2012  Procedure:   Colonoscopy  Indications: Patient is 62 year old Caucasian male with history of colonic adenomas who is undergoing surveillance colonoscopy.  Informed Consent:  The procedure and risks were reviewed with the patient and informed consent was obtained.  Medications:  Demerol 50 mg IV Versed 10 mg IV  Description of procedure:  After a digital rectal exam was performed, that colonoscope was advanced from the anus through the rectum and colon to the area of the cecum, ileocecal valve and appendiceal orifice. The cecum was deeply intubated. These structures were well-seen and photographed for the record. From the level of the cecum and ileocecal valve, the scope was slowly and cautiously withdrawn. The mucosal surfaces were carefully surveyed utilizing scope tip to flexion to facilitate fold flattening as needed. The scope was pulled down into the rectum where a thorough exam including retroflexion was performed.  Findings:   Prep satisfactory. 5 mm polyp cold snared from distal transverse colon. And a small polyp ablated via cold biopsy from distal transverse colon. Both of these polyps were submitted together. Two small diverticula at sigmoid colon. Normal rectal mucosa and unremarkable anorectal junction.  Therapeutic/Diagnostic Maneuvers Performed:  See above  Complications:  None  Cecal Withdrawal Time:  20 minutes  Impression:  Examination performed to cecum. Two small polyps removed from transverse colon and submitted together(one cold snared and the other one removed via  cold biopsy). two small diverticula at sigmoid colon.   Recommendations:  Standard instructions given. I will contact patient with biopsy results and further  recommendations.  REHMAN,NAJEEB U  09/01/2012 11:24 AM  CC: Dr. [name]., MD & Dr. Bonnetta Barry ref. provider found

## 2012-09-06 ENCOUNTER — Encounter (HOSPITAL_COMMUNITY): Payer: Self-pay | Admitting: Internal Medicine

## 2012-09-13 ENCOUNTER — Encounter (INDEPENDENT_AMBULATORY_CARE_PROVIDER_SITE_OTHER): Payer: Self-pay | Admitting: *Deleted

## 2013-08-22 ENCOUNTER — Encounter (INDEPENDENT_AMBULATORY_CARE_PROVIDER_SITE_OTHER): Payer: Self-pay

## 2013-08-22 ENCOUNTER — Encounter: Payer: Self-pay | Admitting: Neurology

## 2013-08-22 ENCOUNTER — Ambulatory Visit (INDEPENDENT_AMBULATORY_CARE_PROVIDER_SITE_OTHER): Payer: BC Managed Care – PPO | Admitting: Neurology

## 2013-08-22 VITALS — BP 156/101 | HR 100 | Temp 97.0°F | Ht 70.0 in | Wt 205.0 lb

## 2013-08-22 DIAGNOSIS — R351 Nocturia: Secondary | ICD-10-CM

## 2013-08-22 DIAGNOSIS — G4734 Idiopathic sleep related nonobstructive alveolar hypoventilation: Secondary | ICD-10-CM

## 2013-08-22 DIAGNOSIS — R0981 Nasal congestion: Secondary | ICD-10-CM

## 2013-08-22 DIAGNOSIS — G4733 Obstructive sleep apnea (adult) (pediatric): Secondary | ICD-10-CM

## 2013-08-22 DIAGNOSIS — R0902 Hypoxemia: Secondary | ICD-10-CM

## 2013-08-22 DIAGNOSIS — J3489 Other specified disorders of nose and nasal sinuses: Secondary | ICD-10-CM

## 2013-08-22 MED ORDER — FLUTICASONE PROPIONATE 50 MCG/ACT NA SUSP: 1.0000 | Freq: Every day | NASAL | Status: DC

## 2013-08-22 NOTE — Patient Instructions (Addendum)
Based on your symptoms and your exam I believe you are at risk for obstructive sleep apnea or OSA, and I think we should proceed with a sleep study to determine whether you do or do not have OSA and how severe it is. If you have more than mild OSA, I want you to consider treatment with CPAP. Please remember, the risks and ramifications of moderate to severe obstructive sleep apnea or OSA are: Cardiovascular disease, including congestive heart failure, stroke, difficult to control hypertension, arrhythmias, and even type 2 diabetes has been linked to untreated OSA. Sleep apnea causes disruption of sleep and sleep deprivation in most cases, which, in turn, can cause recurrent headaches, problems with memory, mood, concentration, focus, and vigilance. Most people with untreated sleep apnea report excessive daytime sleepiness, which can affect their ability to drive. Please do not drive if you feel sleepy.  I will see you back after your sleep study to go over the test results and where to go from there. We will call you after your sleep study and to set up an appointment at the time.   Please stop using Afrin or use it no more than 3 days in a row. We will try you on Flonase and use the [employer].   Please remember to try to maintain good sleep hygiene, which means: Keep a regular sleep and wake schedule, try not to exercise or have a meal within 2 hours of your bedtime, try to keep your bedroom conducive for sleep, that is, cool and dark, without light distractors such as an illuminated alarm clock, and refrain from watching TV right before sleep or in the middle of the night and do not keep the TV or radio on during the night. Also, try not to use or play on electronic devices at bedtime, such as your cell phone, tablet PC or laptop. If you like to read at bedtime on an electronic device, try to dim the background light as much as possible. Do not eat in the middle of the night.   You can bring your Ambien.

## 2013-08-22 NOTE — Progress Notes (Signed)
Subjective:    Patient ID: Joel Greene is a 63 y.o. male.  HPI    [name], MD, PhD [facility] [address] [place], [place] [address]  Dear Dr. Gerarda Fraction,   I saw your patient, Joel Greene, upon your kind request in my neurologic clinic today for initial consultation of a sleep disorder, in particular concern for obstructive sleep apnea. The patient is unaccompanied today. As you know, [name] is a very pleasant 63 year old right-handed gentleman with an underlying medical history of anxiety, recurrent UTIs, remote Hx of smoking, mild obesity, and nephrolithiasis, who recently had an abnormal overnight pulse oximetry test which showed periods of recurrent desaturations as low as 62%. He complains of snoring, apneic pauses while asleep and waking up with a choking or gasping sensation. He also is a restless sleeper with complaint of non-restorative sleep as well as difficulty with sleep maintenance. I reviewed his overnight oximetry test result from 08/05/2013. His mean oxygen saturation was 88.45%. His lowest oxygen saturation was 62%, time below 88% saturation was 5 hours and 53 minutes. He has been started on an oxygen concentrator some 3 days ago and has been sleeping much better since then. He has a Hx of prolonged bronchitis some 4 years ago (after he took the flu shot, he says), and was treated with ABx and steroids. He did not take the flu shot after that. He has been on Ambien or Xanax at night. His urologist has recommended a sleep study. He is retired.  He has nasal congestion, and uses OTC afrin.   His typical bedtime is reported to be around 10 PM and usual wake time is around 7 AM. Sleep onset typically occurs within a few minutes, but he has had trouble with sleep maintenance. He reports feeling marginally rested upon awakening. He wakes up on an average 4 to 5 times in the middle of the night and has to go to the bathroom 4 times on a  typical night. He reports occasional morning headaches.  He reports excessive daytime somnolence (EDS) and His Epworth Sleepiness Score (ESS) is 19/24 today. He has not fallen asleep while driving, but has come close. He has a CDL and drives busses part-time. The patient has been taking a planned nap, which is usually after lunch, and 2 hours long.  She has been known to snore for the past many years. Snoring is reportedly marked, and associated with choking sounds and witnessed apneas. The patient admits to a sense of choking or strangling feeling. There is no report of nighttime reflux, with rare nighttime cough experienced. There is no family history of RLS or OSA. His mother had a sleep study. He is a restless sleeper and in the morning, the bed is quite disheveled.   He denies cataplexy, sleep paralysis, hypnagogic or hypnopompic hallucinations, or sleep attacks. He does not report any vivid dreams, nightmares, dream enactments, or parasomnias, such as sleep talking or sleep walking. The patient has not had a sleep study or a home sleep test.  He consumes very little caffeine.   His bedroom is usually dark and cool. There is a TV in the bedroom and usually it is on a 45 min timer.   His Past Medical History Is Significant For: Past Medical History  Diagnosis Date  . IBS (irritable bowel syndrome)   . Renal calculi   . Glaucoma   . Leukopenia 10/23/2011    Decrease in lymphocytes dating back  to 1995.  Marland Kitchen History of tobacco abuse 10/23/2011    History of smoking for 10 years, a pack per day, quitting at age 44.  . Bronchitis     His Past Surgical History Is Significant For: Past Surgical History  Procedure Laterality Date  . Cystography    . Appendectomy    . Rotator cuff repair      bilateral shoulders  . Refractive surgery      "to relieve glaucoma"  . Colonoscopy N/A 09/01/2012    Procedure: COLONOSCOPY;  Surgeon: [name], MD;  Location: [facility];  Service: Endoscopy;   Laterality: N/A;  1030    His Family History Is Significant For: Family History  Problem Relation Age of Onset  . Breast cancer Mother   . Kidney Stones Brother     His Social History Is Significant For: History   Social History  . Marital Status: Divorced    Spouse Name: N/A    Number of Children: N/A  . Years of Education: N/A   Social History Main Topics  . Smoking status: Former Smoker -- 2.00 packs/day for 15 years    Types: Cigarettes    Quit date: 07/01/1983  . Smokeless tobacco: Never Used  . Alcohol Use: Yes     Comment: occasional  . Drug Use: No  . Sexual Activity: None   Other Topics Concern  . None   Social History Narrative  . None    His Allergies Are:  Allergies  Allergen Reactions  . Clams [Shellfish Allergy] Nausea And Vomiting  . Tetracyclines & Related Rash  . Tylenol [Acetaminophen] Rash  :   His Current Medications Are:  Outpatient Encounter Prescriptions as of 08/22/2013  Medication Sig  . ALPRAZolam (XANAX) 1 MG tablet Take 1 mg by mouth at bedtime as needed for anxiety.   . Alum & Mag Hydroxide-Simeth (MAGIC MOUTHWASH) SOLN Take 5 mLs by mouth 4 (four) times daily as needed. For mouth pain  . Aspirin-Acetaminophen-Caffeine (GOODY HEADACHE PO) Take 1 Package by mouth as needed (headache).   . Multiple Vitamin (MULITIVITAMIN WITH MINERALS) TABS Take 1 tablet by mouth daily.  . tamsulosin (FLOMAX) 0.4 MG CAPS capsule Take 1 capsule by mouth daily.  Marland Kitchen zolpidem (AMBIEN) 10 MG tablet Take 10 mg by mouth at bedtime as needed for sleep.    Review of Systems:  Out of a complete 14 point review of systems, all are reviewed and negative with the exception of these symptoms as listed below:  Review of Systems  Constitutional: Positive for activity change (disinterest) and fatigue.  Eyes: Negative.   Respiratory: Positive for cough.        Snoring  Cardiovascular: Negative.   Gastrointestinal: Negative.   Endocrine:       Flushing   Genitourinary: Positive for hematuria and difficulty urinating.  Musculoskeletal: Positive for myalgias.  Skin: Negative.   Allergic/Immunologic: Negative.   Neurological: Positive for headaches.  Hematological: Negative.   Psychiatric/Behavioral: Positive for sleep disturbance (insomnia, snoring, restless leg, e.d.s.), dysphoric mood and decreased concentration. The patient is nervous/anxious.     Objective:  Neurologic Exam  Physical Exam Physical Examination:   Filed Vitals:   08/22/13 1144  BP: 156/101  Pulse: 100  Temp: 97 F (36.1 C)   General Examination: The patient is a very pleasant 63 y.o. male in no acute distress. He appears well-developed and well-nourished and adequately groomed.   HEENT: Normocephalic, atraumatic, pupils are equal, round and reactive to light  and accommodation. Funduscopic exam is normal with sharp disc margins noted. Extraocular tracking is good without limitation to gaze excursion or nystagmus noted. Normal smooth pursuit is noted. Hearing is grossly intact. Tympanic membranes are clear bilaterally. Face is symmetric with normal facial animation and normal facial sensation. Speech is clear with no dysarthria noted. There is no hypophonia. There is no lip, neck/head, jaw or voice tremor. Neck is supple with full range of passive and active motion. There are no carotid bruits on auscultation. Oropharynx exam reveals: moderate mouth dryness, adequate dental hygiene and moderate airway crowding, due to thick soft palate and swollen uvula. Mallampati is class II. Tongue protrudes centrally and palate elevates symmetrically. Tonsils are 1+ in size. Neck size is 16 inches.   Chest: Clear to auscultation without wheezing, rhonchi or crackles noted.  Heart: S1+S2+0, regular and normal without murmurs, rubs or gallops noted.   Abdomen: Soft, non-tender and non-distended with normal bowel sounds appreciated on auscultation.  Extremities: There is no pitting  edema in the distal lower extremities bilaterally. Pedal pulses are intact.  Skin: Warm and dry without trophic changes noted. There are no varicose veins.  Musculoskeletal: exam reveals no obvious joint deformities, tenderness or joint swelling or erythema.   Neurologically:  Mental status: The patient is awake, alert and oriented in all 4 spheres. His immediate and remote memory, attention, language skills and fund of knowledge are appropriate. There is no evidence of aphasia, agnosia, apraxia or anomia. Speech is clear with normal prosody and enunciation. Thought process is linear. Mood is normal and affect is normal.  Cranial nerves II - XII are as described above under HEENT exam. In addition: shoulder shrug is normal with equal shoulder height noted. Motor exam: Normal bulk, strength and tone is noted. There is no drift, tremor or rebound. Romberg is negative. Reflexes are 2+ throughout. Babinski: Toes are flexor bilaterally. Fine motor skills and coordination: intact with normal finger taps, normal hand movements, normal rapid alternating patting, normal foot taps and normal foot agility.  Cerebellar testing: No dysmetria or intention tremor on finger to nose testing. Heel to shin is unremarkable bilaterally. There is no truncal or gait ataxia.  Sensory exam: intact to light touch, pinprick, vibration, temperature sense and proprioception in the upper and lower extremities.  Gait, station and balance: He stands easily. No veering to one side is noted. No leaning to one side is noted. Posture is age-appropriate and stance is narrow based. Gait shows normal stride length and normal pace. No problems turning are noted. He turns en bloc. Tandem walk is unremarkable. Intact toe and heel stance is noted.               Assessment and Plan:  In summary, ISHMAIL MCMANAMON is a very pleasant 63 y.o.-year old male with a history and physical exam concerning for obstructive sleep apnea (OSA). He has been on  supplemental O2 since his abnormal ONO recently. He has nocturia and nocturnal hypoxemia. He has had some elevated BP values.  I had a long chat with the patient about my findings and the diagnosis, its prognosis and treatment options. We talked about medical treatments and non-pharmacological approaches. I explained in particular the risks and ramifications of untreated moderate to severe OSA, especially with respect to developing cardiovascular disease down the Road, including congestive heart failure, difficult to treat hypertension, cardiac arrhythmias, or stroke. Even type 2 diabetes has in part been linked to untreated OSA. We talked about trying to  maintain a healthy lifestyle in general, as well as the importance of weight control. I encouraged the patient to eat healthy, exercise daily and keep well hydrated, to keep a scheduled bedtime and wake time routine, to not skip any meals and eat healthy snacks in between meals.  I recommended the following at this time: sleep study with potential positive airway pressure titration.  I explained the sleep test procedure to the patient and also outlined possible surgical and non-surgical treatment options of OSA, including the use of a custom-made dental device, upper airway surgical options, such as pillar implants, radiofrequency surgery, tongue base surgery, and UPPP. I also explained the CPAP treatment option to the patient, who indicated that he would be willing to try CPAP if the need arises. He may need supplemental O2. He has a Hx of bronchitis. I explained the importance of being compliant with PAP treatment, not only for insurance purposes but primarily to improve His symptoms, and for the patient's long term health benefit, including to reduce His cardiovascular risks. I answered all his questions today and the patient was in agreement. I would like to see him back after the sleep study is completed and encouraged him to call with any interim  questions, concerns, problems or updates.    Thank you very much for allowing me to participate in the care of this nice patient. If I can be of any further assistance to you please do not hesitate to call me at [phone].  Sincerely,   [name], MD, PhD

## 2013-08-29 ENCOUNTER — Ambulatory Visit (INDEPENDENT_AMBULATORY_CARE_PROVIDER_SITE_OTHER): Payer: BC Managed Care – PPO

## 2013-08-29 DIAGNOSIS — G4733 Obstructive sleep apnea (adult) (pediatric): Secondary | ICD-10-CM

## 2013-08-29 DIAGNOSIS — G479 Sleep disorder, unspecified: Secondary | ICD-10-CM

## 2013-08-29 DIAGNOSIS — G4730 Sleep apnea, unspecified: Secondary | ICD-10-CM

## 2013-08-29 DIAGNOSIS — G4710 Hypersomnia, unspecified: Secondary | ICD-10-CM

## 2013-09-02 ENCOUNTER — Telehealth: Payer: Self-pay | Admitting: Neurology

## 2013-09-02 DIAGNOSIS — G4733 Obstructive sleep apnea (adult) (pediatric): Secondary | ICD-10-CM

## 2013-09-02 NOTE — Telephone Encounter (Signed)
Please call and notify patient that the recent sleep study confirmed the diagnosis of OSA, which was indeed severe, as projected. He did very well with CPAP during the study with significant improvement of the respiratory events. Therefore, I would like start the patient on CPAP at home. I placed the order in the chart.   Arrange for CPAP set up at home through a DME company of patient's choice and fax/route report to PCP and referring MD (if other than PCP).   The patient will also need a follow up appointment with me in 6-8 weeks post set up that has to be scheduled; help the patient schedule this (in a follow-up slot).   Please re-enforce the importance of compliance with treatment and the need for [place] to monitor compliance data.   Once you have spoken to the patient and scheduled the return appointment, you may close this encounter, thanks,   [name], MD, PhD [facility] ([place]

## 2013-09-05 ENCOUNTER — Other Ambulatory Visit: Payer: Self-pay | Admitting: *Deleted

## 2013-09-05 ENCOUNTER — Encounter: Payer: Self-pay | Admitting: *Deleted

## 2013-09-05 DIAGNOSIS — R351 Nocturia: Secondary | ICD-10-CM

## 2013-09-05 DIAGNOSIS — G4733 Obstructive sleep apnea (adult) (pediatric): Secondary | ICD-10-CM

## 2013-09-05 DIAGNOSIS — G4734 Idiopathic sleep related nonobstructive alveolar hypoventilation: Secondary | ICD-10-CM

## 2013-09-05 DIAGNOSIS — R0981 Nasal congestion: Secondary | ICD-10-CM

## 2013-09-05 NOTE — Telephone Encounter (Signed)
I called and spoke with the patient about his recent sleep study results. I informed the patient that the study confirmed the diagnosis of severe obstructive sleep apnea and that he did well on CPAP during the night of his study with significant improvement of respiratory events. I also informed the patient that Dr. Rexene Alberts recommends CPAP at therapy at home. Patient stated he would like for his order to be sent to [facility] in [place]. I will fax a copy of the report to Dr. Purcell Nails Fusco's office and mail the report to the patient along with a follow up instruction letter.

## 2013-10-07 ENCOUNTER — Encounter: Payer: Self-pay | Admitting: Neurology

## 2013-10-12 ENCOUNTER — Encounter: Payer: Self-pay | Admitting: Neurology

## 2013-10-12 ENCOUNTER — Ambulatory Visit (INDEPENDENT_AMBULATORY_CARE_PROVIDER_SITE_OTHER): Payer: BC Managed Care – PPO | Admitting: Neurology

## 2013-10-12 ENCOUNTER — Encounter (INDEPENDENT_AMBULATORY_CARE_PROVIDER_SITE_OTHER): Payer: Self-pay

## 2013-10-12 VITALS — BP 133/75 | HR 66 | Temp 97.0°F | Ht 70.0 in | Wt 205.0 lb

## 2013-10-12 DIAGNOSIS — G4733 Obstructive sleep apnea (adult) (pediatric): Secondary | ICD-10-CM

## 2013-10-12 DIAGNOSIS — Z9989 Dependence on other enabling machines and devices: Principal | ICD-10-CM

## 2013-10-12 DIAGNOSIS — J3489 Other specified disorders of nose and nasal sinuses: Secondary | ICD-10-CM

## 2013-10-12 DIAGNOSIS — R0981 Nasal congestion: Secondary | ICD-10-CM

## 2013-10-12 MED ORDER — FLUTICASONE PROPIONATE 50 MCG/ACT NA SUSP: 1.0000 | Freq: Every day | NASAL | Status: DC

## 2013-10-12 NOTE — Patient Instructions (Signed)

## 2013-10-12 NOTE — Progress Notes (Addendum)
Subjective:    Patient ID: Joel Greene is a 63 y.o. male.  HPI     Interim history:   Joel Greene is a 63 year old right-handed gentleman with an underlying medical history of anxiety, recurrent UTIs, remote Hx of smoking, mild obesity, and nephrolithiasis, who presents for followup consultation of his obstructive sleep apnea. He is unaccompanied today. I first met him on 08/22/2013, at which time he reported a recent abnormal overnight pulse oximetry test, which showed periods of recurrent desaturations as low as 62%. He reported snoring and apneic pauses while asleep and waking up with a choking or gasping sensation. He was started on oxygen and reported sleeping much better since then. He has a Hx of prolonged bronchitis some 4 years ago (after he took the flu shot, he says), and was treated with ABx and steroids. He did not take the flu shot after that. He has been on Ambien or Xanax at night. His urologist has recommended a sleep study. He has nasal congestion, and uses OTC afrin. I requested that he return for a sleep study. He is a split-night sleep study in on 08/29/13 and I went over his test results with him in detail today. His baseline sleep efficiency was reduced at 80.6% with a latency to sleep of 16 minutes and wake after sleep onset of 14 minutes with moderate sleep fragmentation noted. He had an increased percentage of light stage sleep and absence of slow-wave and REM sleep. He had moderate to loud snoring. His total AHI was 60.7 per hour. Baseline oxygen saturation was only 89% with a nadir of 76%. Time below 88% saturation was 50 minutes and 24 seconds. He was then started on CPAP. Sleep efficiency was markedly increased at 96.7%. He had an improved arousal index. He had a normal percentage of light stage sleep, absence of slow-wave sleep and an increased percentage of REM sleep at 36.5% with a very low REM latency. He was titrated using a medium nasal pillows mask starting at 5 cm with  a final pressure of 8 cm and reduction of his AHI to 1.3 events per hour at a pressure with supine REM sleep achieved. The average oxygen saturation with CPAP was 92% with a nadir of 82%. Time below 88% saturation with CPAP was 2 minutes and 19 seconds. I placed him on CPAP therapy after that.  Today, I reviewed his compliance data from 09/06/2013 through 10/11/2013 which is the last 36 days during which time he uses CPAP every night with a percent used days greater than 4 hours of 94%, indicating excellent compliance, average usage was 8 hours and 7 minutes, residual AHI at 2.6 with a very low leak. Pressure is 8 with EPR of 1.   Today, he reports that he sleeps much better, is much better rested, less sleepy during the day. He had some soreness in the L nostril, but this is better. He has no or very little residual snoring, per girlfriend. His mother passed away in 08-04-22 and he has to take care of the estate. He has not been using his oxygen at night, which Dr. Gerarda Fraction had prescribed after the abnormal ONO. He has used the O2 on a few occasions, but did not notice a difference, except for maybe more mouth dryness. He has not used his afrin except for one time. He has no further nocturia.  His Past Medical History Is Significant For: Past Medical History  Diagnosis Date  . IBS (irritable bowel syndrome)   .  Renal calculi   . Glaucoma   . Leukopenia 10/23/2011    Decrease in lymphocytes dating back to 1995.  Marland Kitchen History of tobacco abuse 10/23/2011    History of smoking for 10 years, a pack per day, quitting at age 74.  . Bronchitis     His Past Surgical History Is Significant For: Past Surgical History  Procedure Laterality Date  . Cystography    . Appendectomy    . Rotator cuff repair      bilateral shoulders  . Refractive surgery      "to relieve glaucoma"  . Colonoscopy N/A 09/01/2012    Procedure: COLONOSCOPY;  Surgeon: [name], MD;  Location: [facility];  Service: Endoscopy;   Laterality: N/A;  1030    His Family History Is Significant For: Family History  Problem Relation Age of Onset  . Breast cancer Mother   . Kidney Stones Brother     His Social History Is Significant For: History   Social History  . Marital Status: Divorced    Spouse Name: N/A    Number of Children: N/A  . Years of Education: N/A   Social History Main Topics  . Smoking status: Former Smoker -- 2.00 packs/day for 15 years    Types: Cigarettes    Quit date: 07/01/1983  . Smokeless tobacco: Never Used  . Alcohol Use: Yes     Comment: occasional  . Drug Use: No  . Sexual Activity: None   Other Topics Concern  . None   Social History Narrative  . None    His Allergies Are:  Allergies  Allergen Reactions  . Clams [Shellfish Allergy] Nausea And Vomiting  . Tetracyclines & Related Rash  . Tylenol [Acetaminophen] Rash  :   His Current Medications Are:  Outpatient Encounter Prescriptions as of 10/12/2013  Medication Sig  . ALPRAZolam (XANAX) 1 MG tablet Take 1 mg by mouth at bedtime as needed for anxiety.   . Alum & Mag Hydroxide-Simeth (MAGIC MOUTHWASH) SOLN Take 5 mLs by mouth 4 (four) times daily as needed. For mouth pain  . aspirin 81 MG tablet Take 81 mg by mouth daily.  . Aspirin-Acetaminophen-Caffeine (GOODY HEADACHE PO) Take 1 Package by mouth as needed (headache).   . fluticasone (FLONASE) 50 MCG/ACT nasal spray Place 1 spray into both nostrils daily.  . Multiple Vitamin (MULITIVITAMIN WITH MINERALS) TABS Take 1 tablet by mouth daily.  . tamsulosin (FLOMAX) 0.4 MG CAPS capsule Take 1 capsule by mouth daily.  Marland Kitchen zolpidem (AMBIEN) 10 MG tablet Take 10 mg by mouth at bedtime as needed for sleep.   . meloxicam (MOBIC) 15 MG tablet Take 1 tablet by mouth daily.  :  Review of Systems:  Out of a complete 14 point review of systems, all are reviewed and negative with the exception of these symptoms as listed below:  Review of Systems  Constitutional: Negative.    HENT: Negative.   Eyes: Negative.   Respiratory: Negative.   Cardiovascular: Negative.   Gastrointestinal: Negative.   Endocrine: Positive for heat intolerance.       Flushing  Genitourinary: Positive for frequency.  Musculoskeletal: Positive for arthralgias.  Skin: Negative.   Allergic/Immunologic: Negative.   Neurological: Negative.   Hematological: Negative.   Psychiatric/Behavioral: Positive for sleep disturbance (e.d.s., sleep talking) and dysphoric mood. The patient is nervous/anxious.     Objective:  Neurologic Exam  Physical Exam Physical Examination:   Filed Vitals:   10/12/13 1026  BP: 133/75  Pulse: 66  Temp: 97 F (36.1 C)    General Examination: The patient is a very pleasant 63 y.o. male in no acute distress. He appears well-developed and well-nourished and adequately groomed.   HEENT: Normocephalic, atraumatic, pupils are equal, round and reactive to light and accommodation. Funduscopic exam is normal with sharp disc margins noted. Extraocular tracking is good without limitation to gaze excursion or nystagmus noted. Normal smooth pursuit is noted. Hearing is grossly intact. Tympanic membranes are clear bilaterally. Face is symmetric with normal facial animation and normal facial sensation. Speech is clear with no dysarthria noted. There is no hypophonia. There is no lip, neck/head, jaw or voice tremor. Neck is supple with full range of passive and active motion. There are no carotid bruits on auscultation. Oropharynx exam reveals: moderate mouth dryness, adequate dental hygiene and moderate airway crowding, due to thick soft palate and swollen uvula. Mallampati is class II. Tongue protrudes centrally and palate elevates symmetrically. Tonsils are 1+ in size. Neck size is 16 inches. He has mild redness in his R nostril.  Chest: Clear to auscultation without wheezing, rhonchi or crackles noted.  Heart: S1+S2+0, regular and normal without murmurs, rubs or gallops  noted.   Abdomen: Soft, non-tender and non-distended with normal bowel sounds appreciated on auscultation.  Extremities: There is no pitting edema in the distal lower extremities bilaterally. Pedal pulses are intact.  Skin: Warm and dry without trophic changes noted. There are no varicose veins.  Musculoskeletal: exam reveals no obvious joint deformities, tenderness or joint swelling or erythema.   Neurologically:  Mental status: The patient is awake, alert and oriented in all 4 spheres. His immediate and remote memory, attention, language skills and fund of knowledge are appropriate. There is no evidence of aphasia, agnosia, apraxia or anomia. Speech is clear with normal prosody and enunciation. Thought process is linear. Mood is normal and affect is normal.  Cranial nerves II - XII are as described above under HEENT exam. In addition: shoulder shrug is normal with equal shoulder height noted. Motor exam: Normal bulk, strength and tone is noted. There is no drift, tremor or rebound. Romberg is negative. Reflexes are 2+ throughout. Babinski: Toes are flexor bilaterally. Fine motor skills and coordination: intact with normal finger taps, normal hand movements, normal rapid alternating patting, normal foot taps and normal foot agility.  Cerebellar testing: No dysmetria or intention tremor on finger to nose testing. Heel to shin is unremarkable bilaterally. There is no truncal or gait ataxia.  Sensory exam: intact to light touch, pinprick, vibration, temperature sense in the upper and lower extremities.  Gait, station and balance: He stands easily. No veering to one side is noted. No leaning to one side is noted. Posture is age-appropriate and stance is narrow based. Gait shows normal stride length and normal pace. No problems turning are noted. He turns en bloc. Tandem walk is unremarkable. Intact toe and heel stance is noted.                       Assessment and Plan:   In summary, Joel Greene  is a very pleasant 63 y.o.-year old male with an underlying medical history of anxiety, recurrent UTIs, remote Hx of smoking, mild obesity, and nephrolithiasis, who has been confirmed to have severe OSA, now on treatment at a pressure of 8 cwp. His physical exam is stable and He indicates good results with the use of CPAP, and good tolerance of the pressure and  mask. I reviewed the split night sleep study results in detail as well as the compliance data with the patient. I commended him for his great treatment adherence, and that he was able to come off the Afrin. We will use the Flonase for a few months longer. I encouraged to continue to use CPAP regularly to help reduce cardiovascular risk. According to the sleep test results he did well without supplemental oxygen. We may be able to discontinue his oxygen. He is advised that the discontinuation order can come from his primary care physician. However, at this point to make completely sure that he is doing well with the oxygen level I would like to repeat his overnight pulse oximetry test while he is using CPAP in place an order for that and we will fax this to his DME company. He's willing to undergo another pulse oximetry test off supplemental O2, and we will call him with the test results.  We also talked about trying to maintaining a healthy lifestyle in general. I encouraged the patient to eat healthy, exercise daily and keep well hydrated, to keep a scheduled bedtime and wake time routine, to not skip any meals and eat healthy snacks in between meals and to have protein with every meal. I stressed the importance of regular exercise.   I answered all his questions today and the patient was in agreement with the above outlined plan. I would like to see the patient back in 6 months, sooner if the need arises and encouraged him to call with any interim questions, concerns, problems or updates and refill requests.  Most of my 25 minute visit today was spent in  counseling and coordination of care, reviewing test results and reviewing medication.

## 2013-10-28 ENCOUNTER — Encounter: Payer: Self-pay | Admitting: Neurology

## 2013-11-09 NOTE — Progress Notes (Signed)
Quick Note:  Please call and advise patient that his pulse oximetry test was fine. It said it was on room air but did not mention whether he was using his CPAP. I'm assuming he was using CPAP at night? No supplemental O2 is needed.  [name], MD, PhD [facility] ([facility]  ______

## 2013-11-18 ENCOUNTER — Emergency Department (HOSPITAL_COMMUNITY): Payer: BC Managed Care – PPO

## 2013-11-18 ENCOUNTER — Emergency Department (HOSPITAL_COMMUNITY)
Admission: EM | Admit: 2013-11-18 | Discharge: 2013-11-18 | Disposition: A | Discharge status: Home / Self Care | Payer: BC Managed Care – PPO | Attending: Emergency Medicine | Admitting: Emergency Medicine

## 2013-11-18 ENCOUNTER — Encounter (HOSPITAL_COMMUNITY): Payer: Self-pay | Admitting: Emergency Medicine

## 2013-11-18 DIAGNOSIS — Z8719 Personal history of other diseases of the digestive system: Secondary | ICD-10-CM | POA: Insufficient documentation

## 2013-11-18 DIAGNOSIS — Z79899 Other long term (current) drug therapy: Secondary | ICD-10-CM | POA: Insufficient documentation

## 2013-11-18 DIAGNOSIS — S3130XA Unspecified open wound of scrotum and testes, initial encounter: Secondary | ICD-10-CM | POA: Insufficient documentation

## 2013-11-18 DIAGNOSIS — S3131XA Laceration without foreign body of scrotum and testes, initial encounter: Secondary | ICD-10-CM

## 2013-11-18 DIAGNOSIS — Z8709 Personal history of other diseases of the respiratory system: Secondary | ICD-10-CM | POA: Insufficient documentation

## 2013-11-18 DIAGNOSIS — Y929 Unspecified place or not applicable: Secondary | ICD-10-CM | POA: Insufficient documentation

## 2013-11-18 DIAGNOSIS — Z8669 Personal history of other diseases of the nervous system and sense organs: Secondary | ICD-10-CM | POA: Insufficient documentation

## 2013-11-18 DIAGNOSIS — Y9389 Activity, other specified: Secondary | ICD-10-CM | POA: Insufficient documentation

## 2013-11-18 DIAGNOSIS — S20229A Contusion of unspecified back wall of thorax, initial encounter: Secondary | ICD-10-CM | POA: Insufficient documentation

## 2013-11-18 DIAGNOSIS — Z23 Encounter for immunization: Secondary | ICD-10-CM | POA: Insufficient documentation

## 2013-11-18 DIAGNOSIS — Z87891 Personal history of nicotine dependence: Secondary | ICD-10-CM | POA: Insufficient documentation

## 2013-11-18 DIAGNOSIS — W11XXXA Fall on and from ladder, initial encounter: Secondary | ICD-10-CM | POA: Insufficient documentation

## 2013-11-18 DIAGNOSIS — Z862 Personal history of diseases of the blood and blood-forming organs and certain disorders involving the immune mechanism: Secondary | ICD-10-CM | POA: Insufficient documentation

## 2013-11-18 DIAGNOSIS — Z7982 Long term (current) use of aspirin: Secondary | ICD-10-CM | POA: Insufficient documentation

## 2013-11-18 MED ORDER — CEPHALEXIN 500 MG PO CAPS: 500.0000 mg | ORAL_CAPSULE | Freq: Four times a day (QID) | ORAL | Status: DC

## 2013-11-18 MED ORDER — LIDOCAINE-EPINEPHRINE (PF) 1 %-1:200000 IJ SOLN: 10.0000 mL | Freq: Once | INTRAMUSCULAR | Status: DC

## 2013-11-18 MED ORDER — TETANUS-DIPHTH-ACELL PERTUSSIS 5-2.5-18.5 LF-MCG/0.5 IM SUSP
0.5000 mL | Freq: Once | INTRAMUSCULAR | Status: AC
  Administered 2013-11-18: 0.5 mL via INTRAMUSCULAR
  Filled 2013-11-18: qty 0.5

## 2013-11-18 MED ORDER — OXYCODONE HCL 5 MG PO TABS: 5.0000 mg | ORAL_TABLET | ORAL | Status: DC | PRN

## 2013-11-18 MED ORDER — CEPHALEXIN 500 MG PO CAPS
1000.0000 mg | ORAL_CAPSULE | Freq: Once | ORAL | Status: AC
  Administered 2013-11-18: 1000 mg via ORAL
  Filled 2013-11-18: qty 2

## 2013-11-18 MED ORDER — OXYCODONE HCL 5 MG PO TABS
5.0000 mg | ORAL_TABLET | Freq: Once | ORAL | Status: AC
  Administered 2013-11-18: 5 mg via ORAL
  Filled 2013-11-18: qty 1

## 2013-11-18 NOTE — ED Notes (Signed)
Patient reports was cutting trees and fell 8 feet off of ladder yesterday. Complaining of neck and back pain since yesterday. Also reports laceration to scrotum.

## 2013-11-18 NOTE — Discharge Instructions (Signed)
Absorbable Suture Repair Absorbable sutures (stitches) hold skin together so you can heal. Keep skin wounds clean and dry for the next 2 to 3 days. Then, you may gently wash your wound and dress it with an antibiotic ointment as recommended. As your wound begins to heal, the sutures are no longer needed, and they typically begin to fall off. This will take 7 to 10 days. After 10 days, if your sutures are loose, you can remove them by wiping with a clean gauze pad or a cotton ball. Do not pull your sutures out. They should wipe away easily. If after 10 days they do not easily wipe away, have your caregiver take them out. Absorbable sutures may be used deep in a wound to help hold it together. If these stitches are below the skin, the body will absorb them completely in 3 to 4 weeks.  You may need a tetanus shot if:  You cannot remember when you had your last tetanus shot.  You have never had a tetanus shot. If you get a tetanus shot, your arm may swell, get red, and feel warm to the touch. This is common and not a problem. If you need a tetanus shot and you choose not to have one, there is a rare chance of getting tetanus. Sickness from tetanus can be serious. SEEK IMMEDIATE MEDICAL CARE IF:  You have redness in the wound area.  The wound area feels hot to the touch.  You develop swelling in the wound area.  You develop pain.  There is fluid drainage from the wound. Document Released: 07/24/2004 Document Revised: 09/08/2011 Document Reviewed: 11/05/2010 [facility] Patient Information 2014 [place], [place].   Contusion A contusion is a deep bruise. Contusions are the result of an injury that caused bleeding under the skin. The contusion may turn blue, purple, or yellow. Minor injuries will give you a painless contusion, but more severe contusions may stay painful and swollen for a few weeks.  CAUSES  A contusion is usually caused by a blow, trauma, or direct force to an area of the  body. SYMPTOMS   Swelling and redness of the injured area.  Bruising of the injured area.  Tenderness and soreness of the injured area.  Pain. DIAGNOSIS  The diagnosis can be made by taking a history and physical exam. An X-ray, CT scan, or MRI may be needed to determine if there were any associated injuries, such as fractures. TREATMENT  Specific treatment will depend on what area of the body was injured. In general, the best treatment for a contusion is resting, icing, elevating, and applying cold compresses to the injured area. Over-the-counter medicines may also be recommended for pain control. Ask your caregiver what the best treatment is for your contusion. HOME CARE INSTRUCTIONS   Put ice on the injured area.  Put ice in a plastic bag.  Place a towel between your skin and the bag.  Leave the ice on for 15-20 minutes, 03-04 times a day.  Only take over-the-counter or prescription medicines for pain, discomfort, or fever as directed by your caregiver. Your caregiver may recommend avoiding anti-inflammatory medicines (aspirin, ibuprofen, and naproxen) for 48 hours because these medicines may increase bruising.  Rest the injured area.  If possible, elevate the injured area to reduce swelling. SEEK IMMEDIATE MEDICAL CARE IF:   You have increased bruising or swelling.  You have pain that is getting worse.  Your swelling or pain is not relieved with medicines. MAKE SURE YOU:  Understand these instructions.  Will watch your condition.  Will get help right away if you are not doing well or get worse. Document Released: 03/26/2005 Document Revised: 09/08/2011 Document Reviewed: 04/21/2011 [facility] Patient Information 2014 [place], [place].  Oxycodone tablets or capsules What is this medicine? OXYCODONE (ox i KOE done) is a pain reliever. It is used to treat moderate to severe pain. This medicine may be used for other purposes; ask your health care provider or pharmacist  if you have questions. COMMON BRAND NAME(S): Dazidox , Endocodone , OXECTA, OxyIR, Percolone, Roxicodone What should I tell my health care provider before I take this medicine? They need to know if you have any of these conditions: -Addison's disease -brain tumor -drug abuse or addiction -head injury -heart disease -if you frequently drink alcohol containing drinks -kidney disease or problems going to the bathroom -liver disease -lung disease, asthma, or breathing problems -mental problems -an unusual or allergic reaction to oxycodone, codeine, hydrocodone, morphine, other medicines, foods, dyes, or preservatives -pregnant or trying to get pregnant -breast-feeding How should I use this medicine? Take this medicine by mouth with a glass of water. Follow the directions on the prescription label. You can take it with or without food. If it upsets your stomach, take it with food. Take your medicine at regular intervals. Do not take it more often than directed. Do not stop taking except on your doctor's advice. Some brands of this medicine, like Oxecta, have special instructions. Ask your doctor or pharmacist if these directions are for you: Do not cut, crush or chew this medicine. Swallow only one tablet at a time. Do not wet, soak, or lick the tablet before you take it. Talk to your pediatrician regarding the use of this medicine in children. Special care may be needed. Overdosage: If you think you have taken too much of this medicine contact a poison control center or emergency room at once. NOTE: This medicine is only for you. Do not share this medicine with others. What if I miss a dose? If you miss a dose, take it as soon as you can. If it is almost time for your next dose, take only that dose. Do not take double or extra doses. What may interact with this medicine? -alcohol -antihistamines -certain medicines used for nausea like chlorpromazine,  droperidol -erythromycin -ketoconazole -medicines for depression, anxiety, or psychotic disturbances -medicines for sleep -muscle relaxants -naloxone -naltrexone -narcotic medicines (opiates) for pain -nilotinib -phenobarbital -phenytoin -rifampin -ritonavir -voriconazole This list may not describe all possible interactions. Give your health care provider a list of all the medicines, herbs, non-prescription drugs, or dietary supplements you use. Also tell them if you smoke, drink alcohol, or use illegal drugs. Some items may interact with your medicine. What should I watch for while using this medicine? Tell your doctor or health care professional if your pain does not go away, if it gets worse, or if you have new or a different type of pain. You may develop tolerance to the medicine. Tolerance means that you will need a higher dose of the medicine for pain relief. Tolerance is normal and is expected if you take this medicine for a long time. Do not suddenly stop taking your medicine because you may develop a severe reaction. Your body becomes used to the medicine. This does NOT mean you are addicted. Addiction is a behavior related to getting and using a drug for a non-medical reason. If you have pain, you have a medical reason to  take pain medicine. Your doctor will tell you how much medicine to take. If your doctor wants you to stop the medicine, the dose will be slowly lowered over time to avoid any side effects. You may get drowsy or dizzy when you first start taking this medicine or change doses. Do not drive, use machinery, or do anything that may be dangerous until you know how the medicine affects you. Stand or sit up slowly. There are different types of narcotic medicines (opiates) for pain. If you take more than one type at the same time, you may have more side effects. Give your health care provider a list of all medicines you use. Your doctor will tell you how much medicine to take.  Do not take more medicine than directed. Call emergency for help if you have problems breathing. This medicine will cause constipation. Try to have a bowel movement at least every 2 to 3 days. If you do not have a bowel movement for 3 days, call your doctor or health care professional. Your mouth may get dry. Drinking water, chewing sugarless gum, or sucking on hard candy may help. See your dentist every 6 months. What side effects may I notice from receiving this medicine? Side effects that you should report to your doctor or health care professional as soon as possible: -allergic reactions like skin rash, itching or hives, swelling of the face, lips, or tongue -breathing problems -confusion -feeling faint or lightheaded, falls -trouble passing urine or change in the amount of urine -unusually weak or tired Side effects that usually do not require medical attention (report to your doctor or health care professional if they continue or are bothersome): -constipation -dry mouth -itching -nausea, vomiting -upset stomach This list may not describe all possible side effects. Call your doctor for medical advice about side effects. You may report side effects to FDA at 1-800-FDA-1088. Where should I keep my medicine? Keep out of the reach of children. This medicine can be abused. Keep your medicine in a safe place to protect it from theft. Do not share this medicine with anyone. Selling or giving away this medicine is dangerous and against the law. Store at room temperature between 15 and 30 degrees C (59 and 86 degrees F). Protect from light. Keep container tightly closed. This medicine may cause accidental overdose and death if it is taken by other adults, children, or pets. Flush any unused medicine down the toilet to reduce the chance of harm. Do not use the medicine after the expiration date. NOTE: This sheet is a summary. It may not cover all possible information. If you have questions about  this medicine, talk to your doctor, pharmacist, or health care provider.  2014, [employer]. (2013-02-24 13:43:33)  Cephalexin tablets or capsules What is this medicine? CEPHALEXIN (sef a LEX in) is a cephalosporin antibiotic. It is used to treat certain kinds of bacterial infections It will not work for colds, flu, or other viral infections. This medicine may be used for other purposes; ask your health care provider or pharmacist if you have questions. COMMON BRAND NAME(S): Biocef, Keflex, Keftab What should I tell my health care provider before I take this medicine? They need to know if you have any of these conditions: -kidney disease -stomach or intestine problems, especially colitis -an unusual or allergic reaction to cephalexin, other cephalosporins, penicillins, other antibiotics, medicines, foods, dyes or preservatives -pregnant or trying to get pregnant -breast-feeding How should I use this medicine? Take this medicine by  mouth with a full glass of water. Follow the directions on the prescription label. This medicine can be taken with or without food. Take your medicine at regular intervals. Do not take your medicine more often than directed. Take all of your medicine as directed even if you think you are better. Do not skip doses or stop your medicine early. Talk to your pediatrician regarding the use of this medicine in children. While this drug may be prescribed for selected conditions, precautions do apply. Overdosage: If you think you have taken too much of this medicine contact a poison control center or emergency room at once. NOTE: This medicine is only for you. Do not share this medicine with others. What if I miss a dose? If you miss a dose, take it as soon as you can. If it is almost time for your next dose, take only that dose. Do not take double or extra doses. There should be at least 4 to 6 hours between doses. What may interact with this  medicine? -probenecid -some other antibiotics This list may not describe all possible interactions. Give your health care provider a list of all the medicines, herbs, non-prescription drugs, or dietary supplements you use. Also tell them if you smoke, drink alcohol, or use illegal drugs. Some items may interact with your medicine. What should I watch for while using this medicine? Tell your doctor or health care professional if your symptoms do not begin to improve in a few days. Do not treat diarrhea with over the counter products. Contact your doctor if you have diarrhea that lasts more than 2 days or if it is severe and watery. If you have diabetes, you may get a false-positive result for sugar in your urine. Check with your doctor or health care professional. What side effects may I notice from receiving this medicine? Side effects that you should report to your doctor or health care professional as soon as possible: -allergic reactions like skin rash, itching or hives, swelling of the face, lips, or tongue -breathing problems -pain or trouble passing urine -redness, blistering, peeling or loosening of the skin, including inside the mouth -severe or watery diarrhea -unusually weak or tired -yellowing of the eyes, skin Side effects that usually do not require medical attention (report to your doctor or health care professional if they continue or are bothersome): -gas or heartburn -genital or anal irritation -headache -joint or muscle pain -nausea, vomiting This list may not describe all possible side effects. Call your doctor for medical advice about side effects. You may report side effects to FDA at 1-800-FDA-1088. Where should I keep my medicine? Keep out of the reach of children. Store at room temperature between 59 and 86 degrees F (15 and 30 degrees C). Throw away any unused medicine after the expiration date. NOTE: This sheet is a summary. It may not cover all possible  information. If you have questions about this medicine, talk to your doctor, pharmacist, or health care provider.  2014, [employer]. (2007-09-20 17:09:13)

## 2013-11-18 NOTE — ED Provider Notes (Signed)
CSN: [id number]     Arrival date & time 11/18/13  0557 History   First MD Initiated Contact with Patient 11/18/13 [phone]     Chief Complaint  Patient presents with  . Fall  . Laceration     (Consider location/radiation/quality/duration/timing/severity/associated sxs/prior Treatment) Patient is a 63 y.o. male presenting with fall and skin laceration. The history is provided by the patient.  Fall  Laceration He was about 8 feet up a ladder when he fell. He states that he landed in some bushes. He has noted pain in his mid and lower back and has noted that it hurts to turn his head to the right and to the left. He thinks there may have been brief loss of consciousness and he was dazed. This occurred yesterday. Following the fall, he took a shower. This morning, when he woke up, he noticed that there was blood from an apparent laceration on his scrotum. He he was not aware of a laceration initially. He states his pain is severe and he rates it at 10/10. There is no nausea or vomiting and no weakness or numbness or tingling. He does not know when his last tetanus immunization was.  Past Medical History  Diagnosis Date  . IBS (irritable bowel syndrome)   . Renal calculi   . Glaucoma   . Leukopenia 10/23/2011    Decrease in lymphocytes dating back to 1995.  Marland Kitchen History of tobacco abuse 10/23/2011    History of smoking for 10 years, a pack per day, quitting at age 77.  . Bronchitis    Past Surgical History  Procedure Laterality Date  . Cystography    . Appendectomy    . Rotator cuff repair      bilateral shoulders  . Refractive surgery      "to relieve glaucoma"  . Colonoscopy N/A 09/01/2012    Procedure: COLONOSCOPY;  Surgeon: [name], MD;  Location: [facility];  Service: Endoscopy;  Laterality: N/A;  1030   Family History  Problem Relation Age of Onset  . Breast cancer Mother   . Kidney Stones Brother    History  Substance Use Topics  . Smoking status: Former Smoker -- 2.00  packs/day for 15 years    Types: Cigarettes    Quit date: 07/01/1983  . Smokeless tobacco: Never Used  . Alcohol Use: Yes     Comment: occasional    Review of Systems  All other systems reviewed and are negative.     Allergies  Clams; Tetracyclines & related; and Tylenol  Home Medications   Prior to Admission medications   Medication Sig Start Date End Date Taking? Authorizing Provider  ALPRAZolam Duanne Moron) 1 MG tablet Take 1 mg by mouth at bedtime as needed for anxiety.     Historical Provider, MD  Alum & Mag Hydroxide-Simeth (MAGIC MOUTHWASH) SOLN Take 5 mLs by mouth 4 (four) times daily as needed. For mouth pain    Historical Provider, MD  aspirin 81 MG tablet Take 81 mg by mouth daily.    Historical Provider, MD  Aspirin-Acetaminophen-Caffeine (GOODY HEADACHE PO) Take 1 Package by mouth as needed (headache).     Historical Provider, MD  fluticasone (FLONASE) 50 MCG/ACT nasal spray Place 1 spray into both nostrils daily. 10/12/13   [name], MD  meloxicam (MOBIC) 15 MG tablet Take 1 tablet by mouth daily. 10/06/13   Historical Provider, MD  Multiple Vitamin (MULITIVITAMIN WITH MINERALS) TABS Take 1 tablet by mouth daily.  Historical Provider, MD  tamsulosin (FLOMAX) 0.4 MG CAPS capsule Take 1 capsule by mouth daily. 07/13/13   Historical Provider, MD  zolpidem (AMBIEN) 10 MG tablet Take 10 mg by mouth at bedtime as needed for sleep.     Historical Provider, MD   BP 142/91  Pulse 96  Temp(Src) 98.6 F (37 C) (Oral)  Resp 18  Ht 5\' 10"  (1.778 m)  Wt 200 lb (90.719 kg)  BMI 28.70 kg/m2  SpO2 96% Physical Exam  Nursing note and vitals reviewed.  63 year old male, resting comfortably and in no acute distress. Vital signs are significant for mild hypertension with blood pressure 142/91. Oxygen saturation is 96%, which is normal. Head is normocephalic and atraumatic. PERRLA, EOMI. Oropharynx is clear. Neck is nontender without adenopathy or JVD. Back is moderately tender in  the mid and lower thoracic area and the entire lumbar area. Lungs are clear without rales, wheezes, or rhonchi. Chest is nontender. Heart has regular rate and rhythm without murmur. Abdomen is soft, flat, nontender without masses or hepatosplenomegaly and peristalsis is normoactive. Pelvis is nontender and stable. Genitalia: Uncircumcised penis, testes descended. There is a laceration of the left side of the scrotum with a flap, but it is not full thickness. Ecchymosis and tenderness over the symphysis pubis. Extremities have no cyanosis or edema, full range of motion is present. Skin is warm and dry without rash. Neurologic: Mental status is normal, cranial nerves are intact, there are no motor or sensory deficits. He does have perseveration of speech.  ED Course  Procedures (including critical care time) LACERATION REPAIR Performed by: Delora Fuel Authorized by: Delora Fuel Consent: Verbal consent obtained. Risks and benefits: risks, benefits and alternatives were discussed Consent given by: patient Patient identity confirmed: provided demographic data Prepped and Draped in normal sterile fashion Wound explored  Laceration Location: scrotum  Laceration Length: 10 cm  No Foreign Bodies seen or palpated  Anesthesia: local infiltration  Local anesthetic: lidocaine 1% with epinephrine  Anesthetic total: 4 ml  Amount of cleaning: standard  Skin closure: close  Technique: running suture with 4-0 chromic gut  Patient tolerance: Patient tolerated the procedure well with no immediate complications.  Imaging Review Dg Thoracic Spine W/swimmers  11/18/2013   CLINICAL DATA:  Fall, back pain  EXAM: THORACIC SPINE - 2 VIEW + SWIMMERS  COMPARISON:  CT UROGRAM dated 01/30/2013; DG CHEST PORTABLE dated 01/30/2013; DG CHEST 2 VIEW dated 10/19/2007; DG CHEST 2 VIEW dated 09/17/2006  FINDINGS: There is no evidence of thoracic spine fracture. Alignment is normal. No other significant bone  abnormalities are identified. Mild disk degenerative change noted at C5-C6.  IMPRESSION: Negative.   Electronically Signed   By: Conchita Paris M.D.   On: 11/18/2013 07:43   Dg Lumbar Spine Complete  11/18/2013   CLINICAL DATA:  Fall, back pain  EXAM: LUMBAR SPINE - COMPLETE 4+ VIEW  COMPARISON:  CT UROGRAM dated 01/30/2013  FINDINGS: 5 non rib-bearing lumbar type vertebral bodies are identified. Mild disk degenerative change noted most prominent at L5-S1. Minimal apparent superior endplate depression of L3 is stable. Vertebral body heights are otherwise maintained. Normal alignment. Right upper renal pole calculi reidentified.  IMPRESSION: No acute osseous abnormality of the lumbar spine.   Electronically Signed   By: Conchita Paris M.D.   On: 11/18/2013 07:39   Dg Pelvis 1-2 Views  11/18/2013   CLINICAL DATA:  Fall, pelvic pain  EXAM: PELVIS - 1-2 VIEW  COMPARISON:  DG LUMBAR  SPINE COMPLETE dated 11/18/2013  FINDINGS: There is no evidence of pelvic fracture or diastasis. No other pelvic bone lesions are seen. Mild multilevel disk degenerative change reidentified. Clips from presumed vasectomy noted.  IMPRESSION: Negative.   Electronically Signed   By: Conchita Paris M.D.   On: 11/18/2013 07:49   Ct Head Wo Contrast  11/18/2013   CLINICAL DATA:  8 foot Fall from a ladder.  Neck pain.  Laceration.  EXAM: CT HEAD WITHOUT CONTRAST  CT CERVICAL SPINE WITHOUT CONTRAST  TECHNIQUE: Multidetector CT imaging of the head and cervical spine was performed following the standard protocol without intravenous contrast. Multiplanar CT image reconstructions of the cervical spine were also generated.  COMPARISON:  08/13/2004 MRI cervical spine.  FINDINGS: CT HEAD FINDINGS  A small hypodensity along the right external capsule may represent a remote lacunar infarct. Otherwise, the brainstem, cerebellum, cerebral peduncles, thalamus, basal ganglia, basilar cisterns, and ventricular system appear within normal limits. No  intracranial hemorrhage, mass lesion, or acute CVA. Minimal chronic left maxillary and ethmoid sinusitis.  CT CERVICAL SPINE FINDINGS  There is degenerative fusion of the facet joints at C2-3 and on the right at C4-5. Multilevel facet and uncinate spurring is observed in the cervical spine.  No vertebral subluxation is observed. Mild loss of disc height at the C5-6 level with vacuum disc phenomenon at C5-6 and C6-7.  No cervical spine fracture identified.  IMPRESSION: 1. No acute intracranial or cervical spine findings. 2. Cervical spondylosis. 3. Small hypodensity in the right external capsule may represent a small remote lacunar infarct. 4. Minimal chronic left maxillary and ethmoid sinusitis.   Electronically Signed   By: Sherryl Barters M.D.   On: 11/18/2013 07:40   Ct Cervical Spine Wo Contrast  11/18/2013   CLINICAL DATA:  8 foot Fall from a ladder.  Neck pain.  Laceration.  EXAM: CT HEAD WITHOUT CONTRAST  CT CERVICAL SPINE WITHOUT CONTRAST  TECHNIQUE: Multidetector CT imaging of the head and cervical spine was performed following the standard protocol without intravenous contrast. Multiplanar CT image reconstructions of the cervical spine were also generated.  COMPARISON:  08/13/2004 MRI cervical spine.  FINDINGS: CT HEAD FINDINGS  A small hypodensity along the right external capsule may represent a remote lacunar infarct. Otherwise, the brainstem, cerebellum, cerebral peduncles, thalamus, basal ganglia, basilar cisterns, and ventricular system appear within normal limits. No intracranial hemorrhage, mass lesion, or acute CVA. Minimal chronic left maxillary and ethmoid sinusitis.  CT CERVICAL SPINE FINDINGS  There is degenerative fusion of the facet joints at C2-3 and on the right at C4-5. Multilevel facet and uncinate spurring is observed in the cervical spine.  No vertebral subluxation is observed. Mild loss of disc height at the C5-6 level with vacuum disc phenomenon at C5-6 and C6-7.  No cervical  spine fracture identified.  IMPRESSION: 1. No acute intracranial or cervical spine findings. 2. Cervical spondylosis. 3. Small hypodensity in the right external capsule may represent a small remote lacunar infarct. 4. Minimal chronic left maxillary and ethmoid sinusitis.   Electronically Signed   By: Sherryl Barters M.D.   On: 11/18/2013 07:40   MDM   Final diagnoses:  Fall from ladder  Contusion, back  Laceration of scrotum    Fall with back and neck pain and laceration of scrotum. He is given TDaP booster. He is sent for CT of the head and cervical spine as well as plain films of lumbar and thoracic spine. Laceration will need to be repaired.  Because of length of time from when the injury occurred, he will be placed on prophylactic antibiotics.  X-rays and CT scans and no evidence of acute injury. He is discharged with prescription for oxycodone for pain and also cephalexin for 5 days. He is to followup with his PCP for wound check in 3 days.  Delora Fuel, MD 97/98/92 [id number]

## 2014-01-16 NOTE — Progress Notes (Signed)
Quick Note:  I reviewed the patient's CPAP compliance data from 12/07/2013 to 01/05/2014, which is a total of 30 days, during which time the patient used CPAP every day. The average usage for all days was 8 hours and 50 minutes. The percent used days greater than 4 hours was 100 %, indicating superb compliance. The residual AHI was 2.3 per hour, indicating an adequate treatment pressure of 8 cwp with EPR of 1. Air leak from the mask was low at 13.4 L per minute at the 95th percentile. I will review this data with the patient at the next office visit, which is currently routinely scheduled for 04/11/2014 at 11:30 AM, provide feedback and additional troubleshooting if need be.  Joel Age, MD, PhD [facility] ([facility]   ______

## 2014-01-20 ENCOUNTER — Encounter: Payer: Self-pay | Admitting: Neurology

## 2014-04-11 ENCOUNTER — Encounter: Payer: Self-pay | Admitting: Neurology

## 2014-04-11 ENCOUNTER — Ambulatory Visit (INDEPENDENT_AMBULATORY_CARE_PROVIDER_SITE_OTHER): Payer: BC Managed Care – PPO | Admitting: Neurology

## 2014-04-11 ENCOUNTER — Encounter (INDEPENDENT_AMBULATORY_CARE_PROVIDER_SITE_OTHER): Payer: Self-pay

## 2014-04-11 VITALS — BP 131/82 | HR 77 | Ht 70.0 in | Wt 207.0 lb

## 2014-04-11 DIAGNOSIS — G4730 Sleep apnea, unspecified: Secondary | ICD-10-CM

## 2014-04-11 DIAGNOSIS — G4733 Obstructive sleep apnea (adult) (pediatric): Secondary | ICD-10-CM

## 2014-04-11 DIAGNOSIS — G4700 Insomnia, unspecified: Secondary | ICD-10-CM

## 2014-04-11 DIAGNOSIS — Z9989 Dependence on other enabling machines and devices: Principal | ICD-10-CM

## 2014-04-11 MED ORDER — ZALEPLON 5 MG PO CAPS: 5.0000 mg | ORAL_CAPSULE | Freq: Every evening | ORAL | Status: DC | PRN

## 2014-04-11 NOTE — Patient Instructions (Signed)

## 2014-04-11 NOTE — Progress Notes (Signed)
Subjective:    Patient ID: Joel Greene is a 63 y.o. male.  HPI    Interim history:   Joel Greene is a 63 year old right-handed gentleman with an underlying medical history of anxiety, recurrent UTIs, remote Hx of smoking, mild obesity, and nephrolithiasis, who presents for followup consultation of his obstructive sleep apnea, treated with CPAP. He is unaccompanied today. I last saw him in 10/12/2013, at which time we talked about his sleep study results from March. We also talked about his compliance data. He reported sleeping much better, feeling much less restless, and less sleepy during the day. Nocturia had improved. He did report some soreness in his left nostril due to the mask. He had been taking care of his mother's estate who passed away in August 18, 2013. He did not have to use his oxygen at night once he was placed on CPAP therapy. In the interim, he presented to the emergency room on 11/18/2013 after falling off of ladder. He fell while cutting limbs from a tree and fell about 8 feet. He hurt his back and also sustained a laceration of the scrotum. I reviewed the emergency room records and his imaging test results from 11/18/2013: Dg Thoracic Spine W/swimmers: Negative.  Dg Lumbar Spine Complete: No acute osseous abnormality of the lumbar spine. Dg Pelvis 1-2 Views: Negative.  Ct Head and cervical spine Wo Contrast: 1. No acute intracranial or cervical spine findings. 2. Cervical spondylosis. 3. Small hypodensity in the right external capsule may represent a small remote lacunar infarct. 4. Minimal chronic left maxillary and ethmoid sinusitis.  I reviewed the patient's CPAP compliance data from 12/07/2013 to 01/05/2014, which is a total of 30 days, during which time the patient used CPAP every day. The average usage for all days was 8 hours and 50 minutes. The percent used days greater than 4 hours was 100 %, indicating superb compliance. The residual AHI was 2.3 per hour, indicating an  adequate treatment pressure of 8 cwp with EPR of 1. Air leak from the mask was low at 13.4 L per minute at the 95th percentile.  Today, reviewed his compliance from 03/10/2014 to 04/08/2014 which is a total of 30 days during which time he used his machine every night with 100% compliance. Average usage of 10 hours and 34 minutes, pressure at 8 cm with EPR of 1. Residual AHI low at 1.6 per hour, leak low 8.6 L per minute for the 95th percentile.   Today, he reports, that going back to sleep can still be an issue. He has been using Ambien as needed but feels that it is not helping as much any longer. Overall, he feels he is doing well. He takes his machine even when he is on vacation. He's going to schedule a followup with his primary care physician. He is also up for an eye check. He healed well after his fall. He has no residual symptoms. He got recent supplies for his CPAP.  I first met him on 08/22/2013, at which time he reported a recent abnormal overnight pulse oximetry test, which showed periods of recurrent desaturations as low as 62%. He reported snoring and apneic pauses while asleep and waking up with a choking or gasping sensation. He was started on oxygen and reported sleeping much better since then. He has a Hx of prolonged bronchitis some 4 years ago (after he took the flu shot, he says), and was treated with ABx and steroids. He did not take the flu shot  after that. He has been on Ambien or Xanax at night. His urologist has recommended a sleep study. He has nasal congestion, and uses OTC afrin. I requested that he return for a sleep study. He had a split-night sleep study in on 08/29/13: His baseline sleep efficiency was reduced at 80.6% with a latency to sleep of 16 minutes and wake after sleep onset of 14 minutes with moderate sleep fragmentation noted. He had an increased percentage of light stage sleep and absence of slow-wave and REM sleep. He had moderate to loud snoring. His total AHI was  60.7 per hour. Baseline oxygen saturation was only 89% with a nadir of 76%. Time below 88% saturation was 50 minutes and 24 seconds. He was then started on CPAP. Sleep efficiency was markedly increased at 96.7%. He had an improved arousal index. He had a normal percentage of light stage sleep, absence of slow-wave sleep and an increased percentage of REM sleep at 36.5% with a very low REM latency. He was titrated using a medium nasal pillows mask starting at 5 cm with a final pressure of 8 cm and reduction of his AHI to 1.3 events per hour at a pressure with supine REM sleep achieved. The average oxygen saturation with CPAP was 92% with a nadir of 82%. Time below 88% saturation with CPAP was 2 minutes and 19 seconds. I placed him on CPAP therapy after that.  I reviewed his compliance data from 09/06/2013 through 10/11/2013 which is the last 36 days during which time he uses CPAP every night with a percent used days greater than 4 hours of 94%, indicating excellent compliance, average usage was 8 hours and 7 minutes, residual AHI at 2.6 with a very low leak. Pressure is 8 with EPR of 1.   His Past Medical History Is Significant For: Past Medical History  Diagnosis Date  . IBS (irritable bowel syndrome)   . Renal calculi   . Glaucoma   . Leukopenia 10/23/2011    Decrease in lymphocytes dating back to 1995.  Marland Kitchen History of tobacco abuse 10/23/2011    History of smoking for 10 years, a pack per day, quitting at age 10.  . Bronchitis     His Past Surgical History Is Significant For: Past Surgical History  Procedure Laterality Date  . Cystography    . Appendectomy    . Rotator cuff repair      bilateral shoulders  . Refractive surgery      "to relieve glaucoma"  . Colonoscopy N/A 09/01/2012    Procedure: COLONOSCOPY;  Surgeon: [name], MD;  Location: [facility];  Service: Endoscopy;  Laterality: N/A;  1030    His Family History Is Significant For: Family History  Problem Relation Age of  Onset  . Breast cancer Mother   . Kidney Stones Brother     His Social History Is Significant For: History   Social History  . Marital Status: Divorced    Spouse Name: N/A    Number of Children: 1  . Years of Education: college   Occupational History  . retired    Social History Main Topics  . Smoking status: Former Smoker -- 2.00 packs/day for 15 years    Types: Cigarettes    Quit date: 07/01/1983  . Smokeless tobacco: Never Used  . Alcohol Use: Yes     Comment: occasional  . Drug Use: No  . Sexual Activity: None   Other Topics Concern  . None   Social  History Narrative  . None    His Allergies Are:  Allergies  Allergen Reactions  . Clams [Shellfish Allergy] Nausea And Vomiting  . Tetracyclines & Related Rash  . Tylenol [Acetaminophen] Rash  :   His Current Medications Are:  Outpatient Encounter Prescriptions as of 04/11/2014  Medication Sig  . ALPRAZolam (XANAX) 1 MG tablet Take 1 mg by mouth at bedtime as needed for anxiety.   Marland Kitchen aspirin 81 MG tablet Take 81 mg by mouth daily.  . Aspirin-Acetaminophen-Caffeine (GOODY HEADACHE PO) Take 1 Package by mouth as needed (headache).   . fluticasone (FLONASE) 50 MCG/ACT nasal spray Place 1 spray into both nostrils daily.  . meloxicam (MOBIC) 15 MG tablet Take 1 tablet by mouth daily.  . Multiple Vitamin (MULITIVITAMIN WITH MINERALS) TABS Take 1 tablet by mouth daily.  . tamsulosin (FLOMAX) 0.4 MG CAPS capsule Take 1 capsule by mouth daily.  Marland Kitchen zolpidem (AMBIEN) 10 MG tablet Take 10 mg by mouth at bedtime as needed for sleep.   . [DISCONTINUED] Alum & Mag Hydroxide-Simeth (MAGIC MOUTHWASH) SOLN Take 5 mLs by mouth 4 (four) times daily as needed. For mouth pain  . [DISCONTINUED] cephALEXin (KEFLEX) 500 MG capsule Take 1 capsule (500 mg total) by mouth 4 (four) times daily.  . [DISCONTINUED] oxyCODONE (OXY IR/ROXICODONE) 5 MG immediate release tablet Take 1 tablet (5 mg total) by mouth every 4 (four) hours as needed for  severe pain.  :  Review of Systems:  Out of a complete 14 point review of systems, all are reviewed and negative with the exception of these symptoms as listed below:   Review of Systems  Genitourinary: Positive for urgency.       Frequency of urination  Neurological:       Restless leg Insomnia Apnea  Daytime sleepiness Snoring     Objective:  Neurologic Exam  Physical Exam Physical Examination:   Filed Vitals:   04/11/14 1109  BP: 131/82  Pulse: 77    General Examination: The patient is a very pleasant 63 y.o. male in no acute distress. He appears well-developed and well-nourished and adequately groomed.   HEENT: Normocephalic, atraumatic, pupils are equal, round and reactive to light and accommodation. He has bilateral cataracts. Funduscopic exam is normal with sharp disc margins noted. Extraocular tracking is good without limitation to gaze excursion or nystagmus noted. Normal smooth pursuit is noted. Hearing is grossly intact. Face is symmetric with normal facial animation and normal facial sensation. Speech is clear with no dysarthria noted. There is no hypophonia. There is no lip, neck/head, jaw or voice tremor. Neck is supple with full range of passive and active motion. There are no carotid bruits on auscultation. Oropharynx exam reveals: Mild mouth dryness, adequate dental hygiene and moderate airway crowding, due to thick soft palate and swollen uvula. He has mild pharyngeal erythema. Of note he had a recent upper respiratory infection he states. Mallampati is class II. Tongue protrudes centrally and palate elevates symmetrically. Tonsils are 1+ in size.   Chest: Clear to auscultation without wheezing, rhonchi or crackles noted.  Heart: S1+S2+0, regular and normal without murmurs, rubs or gallops noted.   Abdomen: Soft, non-tender and non-distended with normal bowel sounds appreciated on auscultation.  Extremities: There is no pitting edema in the distal lower  extremities bilaterally. Pedal pulses are intact.  Skin: Warm and dry without trophic changes noted. There are no varicose veins.  Musculoskeletal: exam reveals no obvious joint deformities, tenderness or joint swelling or erythema.  Neurologically:  Mental status: The patient is awake, alert and oriented in all 4 spheres. His immediate and remote memory, attention, language skills and fund of knowledge are appropriate. There is no evidence of aphasia, agnosia, apraxia or anomia. Speech is clear with normal prosody and enunciation. Thought process is linear. Mood is normal and affect is normal.  Cranial nerves II - XII are as described above under HEENT exam. In addition: shoulder shrug is normal with equal shoulder height noted. Motor exam: Normal bulk, strength and tone is noted. There is no drift, tremor or rebound. Romberg is negative. Reflexes are 2+ throughout. Babinski: Toes are flexor bilaterally. Fine motor skills and coordination: intact with normal finger taps, normal hand movements, normal rapid alternating patting, normal foot taps and normal foot agility.  Cerebellar testing: No dysmetria or intention tremor on finger to nose testing. Heel to shin is unremarkable bilaterally. There is no truncal or gait ataxia.  Sensory exam: intact to light touch, pinprick, vibration, temperature sense in the upper and lower extremities.  Gait, station and balance: He stands easily. No veering to one side is noted. No leaning to one side is noted. Posture is age-appropriate and stance is narrow based. Gait shows normal stride length and normal pace. No problems turning are noted. He turns en bloc. Tandem walk is unremarkable. Intact toe and heel stance is noted.                       Assessment and Plan:   In summary, ALYN JURNEY is a very pleasant 63 year old male with an underlying medical history of anxiety, recurrent UTIs, remote Hx of smoking, mild obesity, and nephrolithiasis, who presents for  followup consultation of his severe obstructive sleep apnea, treated well with CPAP at a pressure of 8 cwp with full compliance. We reviewed his sleep test results again today as well as his compliance data. He continues to endorse better sleep with CPAP, more restorative sleep and less daytime somnolence. He is congratulated on his superb compliance. Nevertheless, he still has issues with sleep maintenance especially around 1 or 2 AM when he goes to the bathroom and has trouble going back to sleep. He has been trying Ambien off and on her primary care provider but it does not help as much any longer. He has never been on [name] from what he recalls. I would like for him to try it at 5 mg strength in the middle of the night when he has typically trouble going back to sleep. He is advised not to combine it with Ambien and not to take it after 3 and to avoid daytime drowsiness or residual sleepiness from it. He is encouraged to continue using his CPAP regularly. We also talked about trying to maintaining a healthy lifestyle in general. I encouraged the patient to eat healthy, exercise daily and keep well hydrated, to keep a scheduled bedtime and wake time routine, to not skip any meals and eat healthy snacks in between meals and to have protein with every meal. I stressed the importance of regular exercise.   I answered all his questions today and the patient was in agreement with the above outlined plan. I would like to see the patient back in 6 months, sooner if the need arises and encouraged him to call with any interim questions, concerns, problems or updates and refill requests.  Most of my 25 minute visit today was spent in counseling and coordination of care, reviewing  test results and reviewing medication.

## 2014-04-13 ENCOUNTER — Encounter: Payer: Self-pay | Admitting: Neurology

## 2014-05-19 NOTE — Progress Notes (Signed)
Quick Note:  I reviewed the patient's CPAP compliance data from 03/14/2014 to 04/12/2014, which is a total of 30 days, during which time the patient used CPAP every day. The average usage for all days was 10 hours and 7 minutes. The percent used days greater than 4 hours was 93 %, indicating excellent compliance. The residual AHI was 1.5 per hour, indicating an adequate treatment pressure of 8 cwp. Air leak from the mask was low. I will review this data with the patient at the next office visit, which is currently routinely scheduled for 10/11/2014 at 9 AM, provide feedback and additional troubleshooting if need be.  [name], MD, PhD [facility] ([facility]   ______

## 2014-05-30 ENCOUNTER — Other Ambulatory Visit: Payer: Self-pay

## 2014-05-30 DIAGNOSIS — Z9989 Dependence on other enabling machines and devices: Secondary | ICD-10-CM

## 2014-05-30 DIAGNOSIS — N401 Enlarged prostate with lower urinary tract symptoms: Secondary | ICD-10-CM | POA: Diagnosis not present

## 2014-05-30 DIAGNOSIS — N138 Other obstructive and reflux uropathy: Secondary | ICD-10-CM | POA: Diagnosis not present

## 2014-05-30 DIAGNOSIS — R0981 Nasal congestion: Secondary | ICD-10-CM

## 2014-05-30 DIAGNOSIS — G4733 Obstructive sleep apnea (adult) (pediatric): Secondary | ICD-10-CM

## 2014-05-30 MED ORDER — FLUTICASONE PROPIONATE 50 MCG/ACT NA SUSP: 1.0000 | Freq: Every day | NASAL | Status: DC

## 2014-06-02 DIAGNOSIS — S46811A Strain of other muscles, fascia and tendons at shoulder and upper arm level, right arm, initial encounter: Secondary | ICD-10-CM | POA: Diagnosis not present

## 2014-06-02 DIAGNOSIS — G8918 Other acute postprocedural pain: Secondary | ICD-10-CM | POA: Diagnosis not present

## 2014-06-02 DIAGNOSIS — S46011A Strain of muscle(s) and tendon(s) of the rotator cuff of right shoulder, initial encounter: Secondary | ICD-10-CM | POA: Diagnosis not present

## 2014-06-02 DIAGNOSIS — M7541 Impingement syndrome of right shoulder: Secondary | ICD-10-CM | POA: Diagnosis not present

## 2014-06-02 DIAGNOSIS — Y929 Unspecified place or not applicable: Secondary | ICD-10-CM | POA: Diagnosis not present

## 2014-06-02 DIAGNOSIS — M7551 Bursitis of right shoulder: Secondary | ICD-10-CM | POA: Diagnosis not present

## 2014-06-02 DIAGNOSIS — X58XXXA Exposure to other specified factors, initial encounter: Secondary | ICD-10-CM | POA: Diagnosis not present

## 2014-06-06 DIAGNOSIS — M75101 Unspecified rotator cuff tear or rupture of right shoulder, not specified as traumatic: Secondary | ICD-10-CM | POA: Diagnosis not present

## 2014-06-06 DIAGNOSIS — M25511 Pain in right shoulder: Secondary | ICD-10-CM | POA: Diagnosis not present

## 2014-06-06 DIAGNOSIS — M6281 Muscle weakness (generalized): Secondary | ICD-10-CM | POA: Diagnosis not present

## 2014-06-09 DIAGNOSIS — M25511 Pain in right shoulder: Secondary | ICD-10-CM | POA: Diagnosis not present

## 2014-06-09 DIAGNOSIS — M6281 Muscle weakness (generalized): Secondary | ICD-10-CM | POA: Diagnosis not present

## 2014-06-09 DIAGNOSIS — M75101 Unspecified rotator cuff tear or rupture of right shoulder, not specified as traumatic: Secondary | ICD-10-CM | POA: Diagnosis not present

## 2014-06-13 DIAGNOSIS — M25511 Pain in right shoulder: Secondary | ICD-10-CM | POA: Diagnosis not present

## 2014-06-13 DIAGNOSIS — M75101 Unspecified rotator cuff tear or rupture of right shoulder, not specified as traumatic: Secondary | ICD-10-CM | POA: Diagnosis not present

## 2014-06-13 DIAGNOSIS — M6281 Muscle weakness (generalized): Secondary | ICD-10-CM | POA: Diagnosis not present

## 2014-06-15 DIAGNOSIS — M75101 Unspecified rotator cuff tear or rupture of right shoulder, not specified as traumatic: Secondary | ICD-10-CM | POA: Diagnosis not present

## 2014-06-15 DIAGNOSIS — M6281 Muscle weakness (generalized): Secondary | ICD-10-CM | POA: Diagnosis not present

## 2014-06-15 DIAGNOSIS — M25511 Pain in right shoulder: Secondary | ICD-10-CM | POA: Diagnosis not present

## 2014-06-20 DIAGNOSIS — M75101 Unspecified rotator cuff tear or rupture of right shoulder, not specified as traumatic: Secondary | ICD-10-CM | POA: Diagnosis not present

## 2014-06-20 DIAGNOSIS — M25511 Pain in right shoulder: Secondary | ICD-10-CM | POA: Diagnosis not present

## 2014-06-20 DIAGNOSIS — M6281 Muscle weakness (generalized): Secondary | ICD-10-CM | POA: Diagnosis not present

## 2014-06-27 DIAGNOSIS — M6281 Muscle weakness (generalized): Secondary | ICD-10-CM | POA: Diagnosis not present

## 2014-06-27 DIAGNOSIS — M75101 Unspecified rotator cuff tear or rupture of right shoulder, not specified as traumatic: Secondary | ICD-10-CM | POA: Diagnosis not present

## 2014-06-27 DIAGNOSIS — M25511 Pain in right shoulder: Secondary | ICD-10-CM | POA: Diagnosis not present

## 2014-06-29 DIAGNOSIS — M25511 Pain in right shoulder: Secondary | ICD-10-CM | POA: Diagnosis not present

## 2014-06-29 DIAGNOSIS — M75101 Unspecified rotator cuff tear or rupture of right shoulder, not specified as traumatic: Secondary | ICD-10-CM | POA: Diagnosis not present

## 2014-06-29 DIAGNOSIS — M6281 Muscle weakness (generalized): Secondary | ICD-10-CM | POA: Diagnosis not present

## 2014-07-04 DIAGNOSIS — M25511 Pain in right shoulder: Secondary | ICD-10-CM | POA: Diagnosis not present

## 2014-07-04 DIAGNOSIS — M75101 Unspecified rotator cuff tear or rupture of right shoulder, not specified as traumatic: Secondary | ICD-10-CM | POA: Diagnosis not present

## 2014-07-04 DIAGNOSIS — M6281 Muscle weakness (generalized): Secondary | ICD-10-CM | POA: Diagnosis not present

## 2014-07-06 DIAGNOSIS — M25511 Pain in right shoulder: Secondary | ICD-10-CM | POA: Diagnosis not present

## 2014-07-06 DIAGNOSIS — M75101 Unspecified rotator cuff tear or rupture of right shoulder, not specified as traumatic: Secondary | ICD-10-CM | POA: Diagnosis not present

## 2014-07-06 DIAGNOSIS — M6281 Muscle weakness (generalized): Secondary | ICD-10-CM | POA: Diagnosis not present

## 2014-07-11 ENCOUNTER — Other Ambulatory Visit: Payer: Self-pay | Admitting: Neurology

## 2014-07-11 DIAGNOSIS — M6281 Muscle weakness (generalized): Secondary | ICD-10-CM | POA: Diagnosis not present

## 2014-07-11 DIAGNOSIS — Z9989 Dependence on other enabling machines and devices: Principal | ICD-10-CM

## 2014-07-11 DIAGNOSIS — M25511 Pain in right shoulder: Secondary | ICD-10-CM | POA: Diagnosis not present

## 2014-07-11 DIAGNOSIS — M75101 Unspecified rotator cuff tear or rupture of right shoulder, not specified as traumatic: Secondary | ICD-10-CM | POA: Diagnosis not present

## 2014-07-11 DIAGNOSIS — G4733 Obstructive sleep apnea (adult) (pediatric): Secondary | ICD-10-CM

## 2014-07-11 NOTE — Progress Notes (Signed)
cpap supplies ordered. Pls fax to [facility]

## 2014-07-13 DIAGNOSIS — M25511 Pain in right shoulder: Secondary | ICD-10-CM | POA: Diagnosis not present

## 2014-07-13 DIAGNOSIS — M6281 Muscle weakness (generalized): Secondary | ICD-10-CM | POA: Diagnosis not present

## 2014-07-13 DIAGNOSIS — M75101 Unspecified rotator cuff tear or rupture of right shoulder, not specified as traumatic: Secondary | ICD-10-CM | POA: Diagnosis not present

## 2014-07-18 DIAGNOSIS — M6281 Muscle weakness (generalized): Secondary | ICD-10-CM | POA: Diagnosis not present

## 2014-07-18 DIAGNOSIS — M75101 Unspecified rotator cuff tear or rupture of right shoulder, not specified as traumatic: Secondary | ICD-10-CM | POA: Diagnosis not present

## 2014-07-18 DIAGNOSIS — M25511 Pain in right shoulder: Secondary | ICD-10-CM | POA: Diagnosis not present

## 2014-07-19 DIAGNOSIS — M6281 Muscle weakness (generalized): Secondary | ICD-10-CM | POA: Diagnosis not present

## 2014-07-19 DIAGNOSIS — M25511 Pain in right shoulder: Secondary | ICD-10-CM | POA: Diagnosis not present

## 2014-07-19 DIAGNOSIS — M75101 Unspecified rotator cuff tear or rupture of right shoulder, not specified as traumatic: Secondary | ICD-10-CM | POA: Diagnosis not present

## 2014-07-20 DIAGNOSIS — M75101 Unspecified rotator cuff tear or rupture of right shoulder, not specified as traumatic: Secondary | ICD-10-CM | POA: Diagnosis not present

## 2014-07-20 DIAGNOSIS — M6281 Muscle weakness (generalized): Secondary | ICD-10-CM | POA: Diagnosis not present

## 2014-07-20 DIAGNOSIS — M25511 Pain in right shoulder: Secondary | ICD-10-CM | POA: Diagnosis not present

## 2014-07-24 DIAGNOSIS — M75101 Unspecified rotator cuff tear or rupture of right shoulder, not specified as traumatic: Secondary | ICD-10-CM | POA: Diagnosis not present

## 2014-07-24 DIAGNOSIS — M6281 Muscle weakness (generalized): Secondary | ICD-10-CM | POA: Diagnosis not present

## 2014-07-24 DIAGNOSIS — M25511 Pain in right shoulder: Secondary | ICD-10-CM | POA: Diagnosis not present

## 2014-07-26 DIAGNOSIS — M75101 Unspecified rotator cuff tear or rupture of right shoulder, not specified as traumatic: Secondary | ICD-10-CM | POA: Diagnosis not present

## 2014-07-26 DIAGNOSIS — M6281 Muscle weakness (generalized): Secondary | ICD-10-CM | POA: Diagnosis not present

## 2014-07-26 DIAGNOSIS — M25511 Pain in right shoulder: Secondary | ICD-10-CM | POA: Diagnosis not present

## 2014-07-28 DIAGNOSIS — M25511 Pain in right shoulder: Secondary | ICD-10-CM | POA: Diagnosis not present

## 2014-07-28 DIAGNOSIS — M75101 Unspecified rotator cuff tear or rupture of right shoulder, not specified as traumatic: Secondary | ICD-10-CM | POA: Diagnosis not present

## 2014-07-28 DIAGNOSIS — M6281 Muscle weakness (generalized): Secondary | ICD-10-CM | POA: Diagnosis not present

## 2014-08-01 DIAGNOSIS — M25511 Pain in right shoulder: Secondary | ICD-10-CM | POA: Diagnosis not present

## 2014-08-01 DIAGNOSIS — M75101 Unspecified rotator cuff tear or rupture of right shoulder, not specified as traumatic: Secondary | ICD-10-CM | POA: Diagnosis not present

## 2014-08-01 DIAGNOSIS — M6281 Muscle weakness (generalized): Secondary | ICD-10-CM | POA: Diagnosis not present

## 2014-08-03 DIAGNOSIS — M6281 Muscle weakness (generalized): Secondary | ICD-10-CM | POA: Diagnosis not present

## 2014-08-03 DIAGNOSIS — M75101 Unspecified rotator cuff tear or rupture of right shoulder, not specified as traumatic: Secondary | ICD-10-CM | POA: Diagnosis not present

## 2014-08-03 DIAGNOSIS — M25511 Pain in right shoulder: Secondary | ICD-10-CM | POA: Diagnosis not present

## 2014-08-09 DIAGNOSIS — M75101 Unspecified rotator cuff tear or rupture of right shoulder, not specified as traumatic: Secondary | ICD-10-CM | POA: Diagnosis not present

## 2014-08-09 DIAGNOSIS — M6281 Muscle weakness (generalized): Secondary | ICD-10-CM | POA: Diagnosis not present

## 2014-08-09 DIAGNOSIS — M25511 Pain in right shoulder: Secondary | ICD-10-CM | POA: Diagnosis not present

## 2014-08-16 DIAGNOSIS — M6281 Muscle weakness (generalized): Secondary | ICD-10-CM | POA: Diagnosis not present

## 2014-08-16 DIAGNOSIS — M25511 Pain in right shoulder: Secondary | ICD-10-CM | POA: Diagnosis not present

## 2014-08-16 DIAGNOSIS — M75101 Unspecified rotator cuff tear or rupture of right shoulder, not specified as traumatic: Secondary | ICD-10-CM | POA: Diagnosis not present

## 2014-08-23 DIAGNOSIS — M75101 Unspecified rotator cuff tear or rupture of right shoulder, not specified as traumatic: Secondary | ICD-10-CM | POA: Diagnosis not present

## 2014-08-23 DIAGNOSIS — M25511 Pain in right shoulder: Secondary | ICD-10-CM | POA: Diagnosis not present

## 2014-08-23 DIAGNOSIS — M6281 Muscle weakness (generalized): Secondary | ICD-10-CM | POA: Diagnosis not present

## 2014-08-29 DIAGNOSIS — M6281 Muscle weakness (generalized): Secondary | ICD-10-CM | POA: Diagnosis not present

## 2014-08-29 DIAGNOSIS — M25511 Pain in right shoulder: Secondary | ICD-10-CM | POA: Diagnosis not present

## 2014-08-29 DIAGNOSIS — M75101 Unspecified rotator cuff tear or rupture of right shoulder, not specified as traumatic: Secondary | ICD-10-CM | POA: Diagnosis not present

## 2014-08-29 DIAGNOSIS — S46011D Strain of muscle(s) and tendon(s) of the rotator cuff of right shoulder, subsequent encounter: Secondary | ICD-10-CM | POA: Diagnosis not present

## 2014-09-15 DIAGNOSIS — M47812 Spondylosis without myelopathy or radiculopathy, cervical region: Secondary | ICD-10-CM | POA: Diagnosis not present

## 2014-09-26 DIAGNOSIS — M9981 Other biomechanical lesions of cervical region: Secondary | ICD-10-CM | POA: Diagnosis not present

## 2014-09-26 DIAGNOSIS — M5030 Other cervical disc degeneration, unspecified cervical region: Secondary | ICD-10-CM | POA: Diagnosis not present

## 2014-09-26 DIAGNOSIS — M47812 Spondylosis without myelopathy or radiculopathy, cervical region: Secondary | ICD-10-CM | POA: Diagnosis not present

## 2014-09-26 DIAGNOSIS — M720 Palmar fascial fibromatosis [Dupuytren]: Secondary | ICD-10-CM | POA: Diagnosis not present

## 2014-10-11 ENCOUNTER — Encounter: Payer: Self-pay | Admitting: Neurology

## 2014-10-11 ENCOUNTER — Ambulatory Visit (INDEPENDENT_AMBULATORY_CARE_PROVIDER_SITE_OTHER): Payer: Medicare Other | Admitting: Neurology

## 2014-10-11 VITALS — BP 137/81 | HR 62 | Resp 16 | Ht 70.0 in | Wt 210.0 lb

## 2014-10-11 DIAGNOSIS — M542 Cervicalgia: Secondary | ICD-10-CM | POA: Diagnosis not present

## 2014-10-11 DIAGNOSIS — Z9889 Other specified postprocedural states: Secondary | ICD-10-CM

## 2014-10-11 DIAGNOSIS — G4700 Insomnia, unspecified: Secondary | ICD-10-CM

## 2014-10-11 DIAGNOSIS — G4730 Sleep apnea, unspecified: Secondary | ICD-10-CM | POA: Diagnosis not present

## 2014-10-11 DIAGNOSIS — M720 Palmar fascial fibromatosis [Dupuytren]: Secondary | ICD-10-CM

## 2014-10-11 DIAGNOSIS — G8929 Other chronic pain: Secondary | ICD-10-CM | POA: Diagnosis not present

## 2014-10-11 DIAGNOSIS — Z9989 Dependence on other enabling machines and devices: Principal | ICD-10-CM

## 2014-10-11 DIAGNOSIS — G4733 Obstructive sleep apnea (adult) (pediatric): Secondary | ICD-10-CM

## 2014-10-11 MED ORDER — ZALEPLON 5 MG PO CAPS: 5.0000 mg | ORAL_CAPSULE | Freq: Every evening | ORAL | Status: DC | PRN

## 2014-10-11 NOTE — Progress Notes (Signed)
Subjective:    Patient ID: Joel Greene is a 64 y.o. male.  HPI     Interim history:   Joel Greene is a 64 year old right-handed gentleman with an underlying medical history of anxiety, recurrent UTIs, remote Hx of smoking, mild obesity, and nephrolithiasis, who presents for followup consultation of his obstructive sleep apnea, treated with CPAP. He is unaccompanied today. I last saw him on 04/11/2014, at which time he reported that sleep maintenance was still an issue from time to time. He was using Ambien as needed. He felt it was not as helpful any longer. He was compliant with treatment. He had no residual symptoms after his recent fall from a ladder.   Today, 10/10/2014: I reviewed his CPAP compliance data from 09/09/2014 through 10/08/2014 which is a total of 30 days during which time he used his machine every night with percent used days greater than 4 hours of 100%, indicating superb compliance with an average usage of 11 hours and 9 minutes and residual AHI low at 1.7 per hour, leak low with the 95th percentile at 1.4 L/m on a pressure of 8 cm without EPR.  Today, 10/10/2014: He reports having R shoulder rotator cuff surgery in December 2015. His scan of the shoulder showed lower cervical spine spondylosis and he saw Dr. Carloyn Manner for this and will see Dr. Francesco Runner for neck injections soon. He also was referred to a hand surgeon for right hand Dupuytren's contracture. As far as the CPAP he is completely compliant with treatment and continues to endorse great results. He says he cannot sleep without it. He still has some issues with sleep initiation and sleep maintenance. We had used [name] as needed and he would like to get a refill. He was using it approximately 4 times a week. He had a change in his insurance since he turned 71. He had to change his retail pharmacy but also his DME provider. He has occasional radiating neck pain to the right.   Previously:   I saw him on 10/12/2013, at which  time we talked about his sleep study results from March. We also talked about his compliance data. He reported sleeping much better, feeling much less restless, and less sleepy during the day. Nocturia had improved. He did report some soreness in his left nostril due to the mask. He had been taking care of his mother's estate who passed away in 09-Aug-2013. He did not have to use his oxygen at night once he was placed on CPAP therapy. In the interim, he presented to the emergency room on 11/18/2013 after falling off of ladder. He fell while cutting limbs from a tree and fell about 8 feet. He hurt his back and also sustained a laceration of the scrotum.  I reviewed the emergency room records and his imaging test results from 11/18/2013: Dg Thoracic Spine W/swimmers: Negative.   Dg Lumbar Spine Complete: No acute osseous abnormality of the lumbar spine. Dg Pelvis 1-2 Views: Negative.   Ct Head and cervical spine Wo Contrast: 1. No acute intracranial or cervical spine findings. 2. Cervical spondylosis. 3. Small hypodensity in the right external capsule may represent a small remote lacunar infarct. 4. Minimal chronic left maxillary and ethmoid sinusitis.    I reviewed the patient's CPAP compliance data from 12/07/2013 to 01/05/2014, which is a total of 30 days, during which time the patient used CPAP every day. The average usage for all days was 8 hours and 50 minutes. The percent used  days greater than 4 hours was 100 %, indicating superb compliance. The residual AHI was 2.3 per hour, indicating an adequate treatment pressure of 8 cwp with EPR of 1. Air leak from the mask was low at 13.4 L per minute at the 95th percentile.  I reviewed his compliance from 03/10/2014 to 04/08/2014 which is a total of 30 days during which time he used his machine every night with 100% compliance. Average usage of 10 hours and 34 minutes, pressure at 8 cm with EPR of 1. Residual AHI low at 1.6 per hour, leak low 8.6 L per minute  for the 95th percentile.    I first met him on 08/22/2013, at which time he reported a recent abnormal overnight pulse oximetry test, which showed periods of recurrent desaturations as low as 62%. He reported snoring and apneic pauses while asleep and waking up with a choking or gasping sensation. He was started on oxygen and reported sleeping much better since then. He has a Hx of prolonged bronchitis some 4 years ago (after he took the flu shot, he says), and was treated with ABx and steroids. He did not take the flu shot after that. He has been on Ambien or Xanax at night. His urologist has recommended a sleep study. He has nasal congestion, and uses OTC afrin. I requested that he return for a sleep study. He had a split-night sleep study in on 08/29/13: His baseline sleep efficiency was reduced at 80.6% with a latency to sleep of 16 minutes and wake after sleep onset of 14 minutes with moderate sleep fragmentation noted. He had an increased percentage of light stage sleep and absence of slow-wave and REM sleep. He had moderate to loud snoring. His total AHI was 60.7 per hour. Baseline oxygen saturation was only 89% with a nadir of 76%. Time below 88% saturation was 50 minutes and 24 seconds. He was then started on CPAP. Sleep efficiency was markedly increased at 96.7%. He had an improved arousal index. He had a normal percentage of light stage sleep, absence of slow-wave sleep and an increased percentage of REM sleep at 36.5% with a very low REM latency. He was titrated using a medium nasal pillows mask starting at 5 cm with a final pressure of 8 cm and reduction of his AHI to 1.3 events per hour at a pressure with supine REM sleep achieved. The average oxygen saturation with CPAP was 92% with a nadir of 82%. Time below 88% saturation with CPAP was 2 minutes and 19 seconds. I placed him on CPAP therapy after that.   I reviewed his compliance data from 09/06/2013 through 10/11/2013 which is the last 36 days  during which time he uses CPAP every night with a percent used days greater than 4 hours of 94%, indicating excellent compliance, average usage was 8 hours and 7 minutes, residual AHI at 2.6 with a very low leak. Pressure is 8 with EPR of 1.   His Past Medical History Is Significant For: Past Medical History  Diagnosis Date  . IBS (irritable bowel syndrome)   . Renal calculi   . Glaucoma   . Leukopenia 10/23/2011    Decrease in lymphocytes dating back to 1995.  Marland Kitchen History of tobacco abuse 10/23/2011    History of smoking for 10 years, a pack per day, quitting at age 69.  . Bronchitis     His Past Surgical History Is Significant For: Past Surgical History  Procedure Laterality Date  . Cystography    .  Appendectomy    . Rotator cuff repair      bilateral shoulders  . Refractive surgery      "to relieve glaucoma"  . Colonoscopy N/A 09/01/2012    Procedure: COLONOSCOPY;  Surgeon: [name], MD;  Location: [facility];  Service: Endoscopy;  Laterality: N/A;  1030    His Family History Is Significant For: Family History  Problem Relation Age of Onset  . Breast cancer Mother   . Kidney Stones Brother     His Social History Is Significant For: History   Social History  . Marital Status: Divorced    Spouse Name: N/A  . Number of Children: 1  . Years of Education: college   Occupational History  . retired    Social History Main Topics  . Smoking status: Former Smoker -- 2.00 packs/day for 15 years    Types: Cigarettes    Quit date: 07/01/1983  . Smokeless tobacco: Never Used  . Alcohol Use: 0.0 oz/week    0 Standard drinks or equivalent per week     Comment: occasional  . Drug Use: No  . Sexual Activity: Not on file   Other Topics Concern  . None   Social History Narrative    His Allergies Are:  Allergies  Allergen Reactions  . Clams [Shellfish Allergy] Nausea And Vomiting  . Tetracyclines & Related Rash  . Tylenol [Acetaminophen] Rash  :   His Current  Medications Are:  Outpatient Encounter Prescriptions as of 10/11/2014  Medication Sig  . ALPRAZolam (XANAX) 1 MG tablet Take 1 mg by mouth at bedtime as needed for anxiety.   Marland Kitchen aspirin 81 MG tablet Take 81 mg by mouth daily.  . Aspirin-Acetaminophen-Caffeine (GOODY HEADACHE PO) Take 1 Package by mouth as needed (headache).   . fluticasone (FLONASE) 50 MCG/ACT nasal spray Place 1 spray into both nostrils daily.  . Multiple Vitamin (MULITIVITAMIN WITH MINERALS) TABS Take 1 tablet by mouth daily.  . tamsulosin (FLOMAX) 0.4 MG CAPS capsule Take 1 capsule by mouth daily.  . zaleplon ([name]) 5 MG capsule Take 1 capsule (5 mg total) by mouth at bedtime as needed for sleep. Take no later than 3 AM to use for sleep maintenance. Do not combine with Ambien!  . zolpidem (AMBIEN) 10 MG tablet Take 10 mg by mouth at bedtime as needed for sleep.   . [DISCONTINUED] zaleplon ([name]) 5 MG capsule Take 1 capsule (5 mg total) by mouth at bedtime as needed for sleep. Take no later than 3 AM to use for sleep maintenance. Do not combine with Ambien!  . [DISCONTINUED] meloxicam (MOBIC) 15 MG tablet Take 1 tablet by mouth daily.  :  Review of Systems:  Out of a complete 14 point review of systems, all are reviewed and negative with the exception of these symptoms as listed below:   Review of Systems  All other systems reviewed and are negative.  Recent shoulder surgery, some radiating neck pain  Objective:  Neurologic Exam  Physical Exam Physical Examination:   Filed Vitals:   10/11/14 0843  BP: 137/81  Pulse: 62  Resp: 16    General Examination: The patient is a very pleasant 64 y.o. male in no acute distress. He appears well-developed and well-nourished and adequately groomed.   HEENT: Normocephalic, atraumatic, pupils are equal, round and reactive to light and accommodation. He has bilateral cataracts. Funduscopic exam is normal with sharp disc margins noted. Extraocular tracking is good without  limitation to gaze excursion  or nystagmus noted. Normal smooth pursuit is noted. Hearing is grossly intact. Face is symmetric with normal facial animation and normal facial sensation. Speech is clear with no dysarthria noted. There is no hypophonia. There is no lip, neck/head, jaw or voice tremor. Neck is supple with full range of passive and active motion. There are no carotid bruits on auscultation. Oropharynx exam reveals: Mild mouth dryness, mild posterior pharynx erythema, adequate dental hygiene and moderate airway crowding, due to thick soft palate and swollen uvula. Mallampati is class II. Tongue protrudes centrally and palate elevates symmetrically. Tonsils are 1+ in size.   Chest: Clear to auscultation without wheezing, rhonchi or crackles noted.  Heart: S1+S2+0, regular and normal without murmurs, rubs or gallops noted.   Abdomen: Soft, non-tender and non-distended with normal bowel sounds appreciated on auscultation.  Extremities: There is no pitting edema in the distal lower extremities bilaterally. Pedal pulses are intact.  Skin: Warm and dry without trophic changes noted. There are no varicose veins.  Musculoskeletal: exam reveals no obvious joint deformities, tenderness or joint swelling or erythema. He has very good range of motion in his right shoulder. He has evidence of bilateral Dupuytren's contractures, right more than left.    Neurologically:  Mental status: The patient is awake, alert and oriented in all 4 spheres. His immediate and remote memory, attention, language skills and fund of knowledge are appropriate. There is no evidence of aphasia, agnosia, apraxia or anomia. Speech is clear with normal prosody and enunciation. Thought process is linear. Mood is normal and affect is normal.  Cranial nerves II - XII are as described above under HEENT exam. In addition: shoulder shrug is normal with equal shoulder height noted. Motor exam: Normal bulk, strength and tone is noted.  There is no drift, tremor or rebound. Romberg is negative. Reflexes are 2+ throughout. Babinski: Toes are flexor bilaterally. Fine motor skills and coordination: intact with normal finger taps, normal hand movements, normal rapid alternating patting, normal foot taps and normal foot agility.  Cerebellar testing: No dysmetria or intention tremor on finger to nose testing. Heel to shin is unremarkable bilaterally. There is no truncal or gait ataxia.  Sensory exam: intact to light touch, pinprick, vibration, temperature sense in the upper and lower extremities.  Gait, station and balance: He stands easily. No veering to one side is noted. No leaning to one side is noted. Posture is age-appropriate and stance is narrow based. Gait shows normal stride length and normal pace. No problems turning are noted. He turns en bloc. Tandem walk is unremarkable.   Assessment and Plan:   In summary, KEGAN MCKEITHAN is a very pleasant 64 year old male with an underlying medical history of anxiety, recurrent UTIs, remote Hx of smoking, mild obesity, and nephrolithiasis, who presents for followup consultation of his severe obstructive sleep apnea, treated well with CPAP at a pressure of 8 cwp with superb compliance. In the interim, he has had right shoulder rotator cuff repair under Dr. Noemi Chapel. He has done excellent after that. He is going to get neck injections for neck degenerative spondylosis as detected on a shoulder MRI recently. He had seen neurosurgery, Dr. Carloyn Manner and will be seeing Dr. Francesco Runner for injections. He was also referred to a hand surgeon for his Dupuytren's contracture . As far as his sleep apnea treatment, he continues to endorse good sleep quality, less daytime somnolence, improved nocturia and better sleep consolidation with some residual sleep onset and sleep maintenance insomnia. We had tried [employer]  for this 6 months ago and he is requesting a refill. He is advised to use this as needed and not to combine it  with Ambien. He demonstrated understanding and agreement. He is also encouraged to not exceed [name] usage for more than 3 nights a week if possible. We again reviewed his sleep test results from before and his most recent compliance data today. He is encouraged to continue using his CPAP regularly. We also talked about trying to maintaining a healthy lifestyle in general. I encouraged the patient to eat healthy, exercise daily and keep well hydrated, to keep a scheduled bedtime and wake time routine, to not skip any meals and eat healthy snacks in between meals and to have protein with every meal. I stressed the importance of regular exercise and weight management. Since he is doing well from my end of things I would like to see him back on a yearly basis at this juncture. I answered all his questions today and he was in agreement. He is encouraged to call with any interim questions or concerns or refill requests.   Most of my 25 minute visit today was spent in counseling and coordination of care, reviewing test results and reviewing medications and the Dx of OSA.

## 2014-10-11 NOTE — Patient Instructions (Addendum)
Keep up the good work with your CPAP! I will see you back in a year!  Use sonata 5 mg as needed, try not to exceed 3 nights a week.

## 2014-11-02 DIAGNOSIS — G4700 Insomnia, unspecified: Secondary | ICD-10-CM | POA: Diagnosis not present

## 2014-11-02 DIAGNOSIS — F419 Anxiety disorder, unspecified: Secondary | ICD-10-CM | POA: Diagnosis not present

## 2014-11-02 DIAGNOSIS — E663 Overweight: Secondary | ICD-10-CM | POA: Diagnosis not present

## 2014-11-02 DIAGNOSIS — Z6829 Body mass index (BMI) 29.0-29.9, adult: Secondary | ICD-10-CM | POA: Diagnosis not present

## 2014-11-06 DIAGNOSIS — M720 Palmar fascial fibromatosis [Dupuytren]: Secondary | ICD-10-CM | POA: Diagnosis not present

## 2014-11-22 DIAGNOSIS — Z7951 Long term (current) use of inhaled steroids: Secondary | ICD-10-CM | POA: Diagnosis not present

## 2014-11-22 DIAGNOSIS — N400 Enlarged prostate without lower urinary tract symptoms: Secondary | ICD-10-CM | POA: Diagnosis not present

## 2014-11-22 DIAGNOSIS — M47812 Spondylosis without myelopathy or radiculopathy, cervical region: Secondary | ICD-10-CM | POA: Diagnosis not present

## 2014-11-22 DIAGNOSIS — M5030 Other cervical disc degeneration, unspecified cervical region: Secondary | ICD-10-CM | POA: Diagnosis not present

## 2014-11-22 DIAGNOSIS — M720 Palmar fascial fibromatosis [Dupuytren]: Secondary | ICD-10-CM | POA: Diagnosis not present

## 2014-11-22 DIAGNOSIS — G4700 Insomnia, unspecified: Secondary | ICD-10-CM | POA: Diagnosis not present

## 2014-11-22 DIAGNOSIS — Z886 Allergy status to analgesic agent status: Secondary | ICD-10-CM | POA: Diagnosis not present

## 2014-11-22 DIAGNOSIS — M9981 Other biomechanical lesions of cervical region: Secondary | ICD-10-CM | POA: Diagnosis not present

## 2014-11-22 DIAGNOSIS — Z7982 Long term (current) use of aspirin: Secondary | ICD-10-CM | POA: Diagnosis not present

## 2014-11-22 DIAGNOSIS — Z9889 Other specified postprocedural states: Secondary | ICD-10-CM | POA: Diagnosis not present

## 2014-11-22 DIAGNOSIS — G8929 Other chronic pain: Secondary | ICD-10-CM | POA: Diagnosis not present

## 2014-11-22 DIAGNOSIS — Z79899 Other long term (current) drug therapy: Secondary | ICD-10-CM | POA: Diagnosis not present

## 2014-11-22 DIAGNOSIS — F419 Anxiety disorder, unspecified: Secondary | ICD-10-CM | POA: Diagnosis not present

## 2014-12-20 DIAGNOSIS — M9981 Other biomechanical lesions of cervical region: Secondary | ICD-10-CM | POA: Diagnosis not present

## 2014-12-20 DIAGNOSIS — M5030 Other cervical disc degeneration, unspecified cervical region: Secondary | ICD-10-CM | POA: Diagnosis not present

## 2014-12-20 DIAGNOSIS — M720 Palmar fascial fibromatosis [Dupuytren]: Secondary | ICD-10-CM | POA: Diagnosis not present

## 2014-12-20 DIAGNOSIS — M47812 Spondylosis without myelopathy or radiculopathy, cervical region: Secondary | ICD-10-CM | POA: Diagnosis not present

## 2014-12-27 DIAGNOSIS — H4011X1 Primary open-angle glaucoma, mild stage: Secondary | ICD-10-CM | POA: Diagnosis not present

## 2014-12-27 DIAGNOSIS — H4011X2 Primary open-angle glaucoma, moderate stage: Secondary | ICD-10-CM | POA: Diagnosis not present

## 2015-01-30 ENCOUNTER — Ambulatory Visit (INDEPENDENT_AMBULATORY_CARE_PROVIDER_SITE_OTHER): Payer: Medicare Other | Admitting: Internal Medicine

## 2015-01-30 ENCOUNTER — Encounter (INDEPENDENT_AMBULATORY_CARE_PROVIDER_SITE_OTHER): Payer: Self-pay | Admitting: Internal Medicine

## 2015-01-30 VITALS — BP 128/70 | HR 72 | Temp 98.0°F | Ht 70.0 in | Wt 202.8 lb

## 2015-01-30 DIAGNOSIS — R197 Diarrhea, unspecified: Secondary | ICD-10-CM | POA: Diagnosis not present

## 2015-01-30 DIAGNOSIS — D72819 Decreased white blood cell count, unspecified: Secondary | ICD-10-CM | POA: Diagnosis not present

## 2015-01-30 DIAGNOSIS — K589 Irritable bowel syndrome without diarrhea: Secondary | ICD-10-CM | POA: Diagnosis not present

## 2015-01-30 MED ORDER — DICYCLOMINE HCL 10 MG PO CAPS: 10.0000 mg | ORAL_CAPSULE | Freq: Three times a day (TID) | ORAL | Status: DC

## 2015-01-30 NOTE — Patient Instructions (Signed)
Rx for Dicyclomine. Stool studies

## 2015-01-30 NOTE — Progress Notes (Addendum)
Subjective:    Patient ID: Joel Greene, male    DOB: [birth year], 64 y.o.   MRN: [id number]  HPI Presents today with c/o abdominal pain. He tells me he is alternating between diarrhea and constipation. If he takes an Imodium he will become constipated. He says he is passing mucous.  He says he has hx of this and has taken Dicyclomine in the past.  For the past 3 weeks he has been drinking water. If he eats solids he will have diarrhea. Normally he has 2-3 times a day. Now he will skip a day or two. Imodium will constipate him.  No antibiotics recently.     09/01/2012: Colonoscopy  Indications: Patient is 64 year old Caucasian male with history of colonic adenomas who is undergoing surveillance colonoscopy. Impression:  Examination performed to cecum. Two small polyps removed from transverse colon and submitted together(one cold snared and the other one removed via cold biopsy). two small diverticula at sigmoid colon.  Small polyps are tubular adenomas Next colonoscopy in 5 years   Review of Systems Past Medical History  Diagnosis Date  . IBS (irritable bowel syndrome)   . Renal calculi   . Glaucoma   . Leukopenia 10/23/2011    Decrease in lymphocytes dating back to 1995.  Marland Kitchen History of tobacco abuse 10/23/2011    History of smoking for 10 years, a pack per day, quitting at age 52.  . Bronchitis     Past Surgical History  Procedure Laterality Date  . Cystography    . Appendectomy    . Rotator cuff repair      bilateral shoulders  . Refractive surgery      "to relieve glaucoma"  . Colonoscopy N/A 09/01/2012    Procedure: COLONOSCOPY;  Surgeon: [name], MD;  Location: [facility];  Service: Endoscopy;  Laterality: N/A;  1030    Allergies  Allergen Reactions  . Clams [Shellfish Allergy] Nausea And Vomiting  . Tetracyclines & Related Rash  . Tylenol [Acetaminophen] Rash    Current Outpatient Prescriptions on File Prior to Visit  Medication Sig Dispense  Refill  . ALPRAZolam (XANAX) 1 MG tablet Take 1 mg by mouth at bedtime as needed for anxiety.     . Aspirin-Acetaminophen-Caffeine (GOODY HEADACHE PO) Take 1 Package by mouth as needed (headache).     . fluticasone (FLONASE) 50 MCG/ACT nasal spray Place 1 spray into both nostrils daily. 16 g 3  . zolpidem (AMBIEN) 10 MG tablet Take 10 mg by mouth at bedtime as needed for sleep.     Marland Kitchen aspirin 81 MG tablet Take 81 mg by mouth daily.    . Multiple Vitamin (MULITIVITAMIN WITH MINERALS) TABS Take 1 tablet by mouth daily.    . tamsulosin (FLOMAX) 0.4 MG CAPS capsule Take 1 capsule by mouth daily.    . zaleplon ([name]) 5 MG capsule Take 1 capsule (5 mg total) by mouth at bedtime as needed for sleep. Take no later than 3 AM to use for sleep maintenance. Do not combine with Ambien! (Patient not taking: Reported on 01/30/2015) 30 capsule 2   No current facility-administered medications on file prior to visit.        Objective:   Physical ExamBlood pressure 128/70, pulse 72, temperature 98 F (36.7 C), height 5\' 10"  (1.778 m), weight 202 lb 12.8 oz (91.989 kg). Alert and oriented. Skin warm and dry. Oral mucosa is moist.   . Sclera anicteric, conjunctivae is pink. Thyroid not enlarged. No cervical  lymphadenopathy. Lungs clear. Heart regular rate and rhythm.  Abdomen is soft. Bowel sounds are positive. No hepatomegaly. No abdominal masses felt. No tenderness.  No edema to lower extremities.          Assessment & Plan:   Probably IBS. I am going to get stool studies. I am going to start him on Dicyclomine and see how he does

## 2015-02-01 LAB — GASTROINTESTINAL PATHOGEN PANEL PCR
C. DIFFICILE TOX A/B, PCR: NEGATIVE
CAMPYLOBACTER, PCR: NEGATIVE
CRYPTOSPORIDIUM, PCR: NEGATIVE
E COLI (STEC) STX1/STX2, PCR: NEGATIVE
E coli (ETEC) LT/ST PCR: NEGATIVE
E coli 0157, PCR: NEGATIVE
Giardia lamblia, PCR: NEGATIVE
Norovirus, PCR: NEGATIVE
Rotavirus A, PCR: NEGATIVE
Salmonella, PCR: NEGATIVE
Shigella, PCR: NEGATIVE

## 2015-02-06 ENCOUNTER — Telehealth: Payer: Self-pay | Admitting: Neurology

## 2015-02-06 ENCOUNTER — Emergency Department (HOSPITAL_COMMUNITY): Payer: Medicare Other

## 2015-02-06 ENCOUNTER — Other Ambulatory Visit: Payer: Self-pay

## 2015-02-06 ENCOUNTER — Telehealth: Payer: Self-pay

## 2015-02-06 ENCOUNTER — Emergency Department (HOSPITAL_COMMUNITY)
Admission: EM | Admit: 2015-02-06 | Discharge: 2015-02-06 | Disposition: A | Discharge status: Home / Self Care | Payer: Medicare Other | Attending: Emergency Medicine | Admitting: Emergency Medicine

## 2015-02-06 ENCOUNTER — Encounter (HOSPITAL_COMMUNITY): Payer: Self-pay | Admitting: *Deleted

## 2015-02-06 DIAGNOSIS — Z87891 Personal history of nicotine dependence: Secondary | ICD-10-CM | POA: Insufficient documentation

## 2015-02-06 DIAGNOSIS — R197 Diarrhea, unspecified: Secondary | ICD-10-CM

## 2015-02-06 DIAGNOSIS — J019 Acute sinusitis, unspecified: Secondary | ICD-10-CM | POA: Diagnosis not present

## 2015-02-06 DIAGNOSIS — Z8669 Personal history of other diseases of the nervous system and sense organs: Secondary | ICD-10-CM | POA: Insufficient documentation

## 2015-02-06 DIAGNOSIS — Z87442 Personal history of urinary calculi: Secondary | ICD-10-CM | POA: Insufficient documentation

## 2015-02-06 DIAGNOSIS — R42 Dizziness and giddiness: Secondary | ICD-10-CM | POA: Diagnosis not present

## 2015-02-06 DIAGNOSIS — G4730 Sleep apnea, unspecified: Principal | ICD-10-CM

## 2015-02-06 DIAGNOSIS — Z8719 Personal history of other diseases of the digestive system: Secondary | ICD-10-CM | POA: Insufficient documentation

## 2015-02-06 DIAGNOSIS — Z79899 Other long term (current) drug therapy: Secondary | ICD-10-CM | POA: Insufficient documentation

## 2015-02-06 DIAGNOSIS — Z7982 Long term (current) use of aspirin: Secondary | ICD-10-CM | POA: Diagnosis not present

## 2015-02-06 DIAGNOSIS — Z862 Personal history of diseases of the blood and blood-forming organs and certain disorders involving the immune mechanism: Secondary | ICD-10-CM | POA: Insufficient documentation

## 2015-02-06 DIAGNOSIS — R110 Nausea: Secondary | ICD-10-CM | POA: Diagnosis present

## 2015-02-06 DIAGNOSIS — R55 Syncope and collapse: Secondary | ICD-10-CM | POA: Diagnosis not present

## 2015-02-06 DIAGNOSIS — G4700 Insomnia, unspecified: Secondary | ICD-10-CM

## 2015-02-06 LAB — URINE MICROSCOPIC-ADD ON

## 2015-02-06 LAB — CBC WITH DIFFERENTIAL/PLATELET
Basophils Absolute: 0 10*3/uL (ref 0.0–0.1)
Basophils Relative: 1 % (ref 0–1)
Eosinophils Absolute: 0 10*3/uL (ref 0.0–0.7)
Eosinophils Relative: 0 % (ref 0–5)
HEMATOCRIT: 45.8 % (ref 39.0–52.0)
HEMOGLOBIN: 16.6 g/dL (ref 13.0–17.0)
LYMPHS ABS: 0.7 10*3/uL (ref 0.7–4.0)
LYMPHS PCT: 16 % (ref 12–46)
MCH: 34.3 pg — ABNORMAL HIGH (ref 26.0–34.0)
MCHC: 36.2 g/dL — AB (ref 30.0–36.0)
MCV: 94.6 fL (ref 78.0–100.0)
Monocytes Absolute: 0.4 10*3/uL (ref 0.1–1.0)
Monocytes Relative: 9 % (ref 3–12)
NEUTROS ABS: 3 10*3/uL (ref 1.7–7.7)
Neutrophils Relative %: 74 % (ref 43–77)
PLATELETS: 183 10*3/uL (ref 150–400)
RBC: 4.84 MIL/uL (ref 4.22–5.81)
RDW: 12 % (ref 11.5–15.5)
WBC: 4.1 10*3/uL (ref 4.0–10.5)

## 2015-02-06 LAB — COMPREHENSIVE METABOLIC PANEL
ALT: 35 U/L (ref 17–63)
AST: 23 U/L (ref 15–41)
Albumin: 4.6 g/dL (ref 3.5–5.0)
Alkaline Phosphatase: 54 U/L (ref 38–126)
Anion gap: 8 (ref 5–15)
BUN: 14 mg/dL (ref 6–20)
CHLORIDE: 104 mmol/L (ref 101–111)
CO2: 27 mmol/L (ref 22–32)
Calcium: 9.4 mg/dL (ref 8.9–10.3)
Creatinine, Ser: 1.02 mg/dL (ref 0.61–1.24)
GFR calc non Af Amer: >60 mL/min (ref >60)
Glucose, Bld: 121 mg/dL — ABNORMAL HIGH (ref 65–99)
Potassium: 3.8 mmol/L (ref 3.5–5.1)
Sodium: 139 mmol/L (ref 135–145)
Total Bilirubin: 0.8 mg/dL (ref 0.3–1.2)
Total Protein: 7.1 g/dL (ref 6.5–8.1)

## 2015-02-06 LAB — URINALYSIS, ROUTINE W REFLEX MICROSCOPIC
Bilirubin Urine: NEGATIVE
Glucose, UA: NEGATIVE mg/dL
Ketones, ur: NEGATIVE mg/dL
LEUKOCYTES UA: NEGATIVE
Nitrite: NEGATIVE
Protein, ur: NEGATIVE mg/dL
Specific Gravity, Urine: >1.03 — ABNORMAL HIGH (ref 1.005–1.030)
UROBILINOGEN UA: 0.2 mg/dL (ref 0.0–1.0)
pH: 5.5 (ref 5.0–8.0)

## 2015-02-06 LAB — LIPASE, BLOOD: LIPASE: 28 U/L (ref 22–51)

## 2015-02-06 MED ORDER — AMOXICILLIN-POT CLAVULANATE 875-125 MG PO TABS
1.0000 | ORAL_TABLET | Freq: Once | ORAL | Status: AC
  Administered 2015-02-06: 1 via ORAL
  Filled 2015-02-06: qty 1

## 2015-02-06 MED ORDER — AMOXICILLIN-POT CLAVULANATE 875-125 MG PO TABS: 1.0000 | ORAL_TABLET | Freq: Two times a day (BID) | ORAL | Status: DC

## 2015-02-06 MED ORDER — ZALEPLON 5 MG PO CAPS: 5.0000 mg | ORAL_CAPSULE | Freq: Every evening | ORAL | Status: DC | PRN

## 2015-02-06 NOTE — Telephone Encounter (Signed)
I will fax it to [place]. Thank you.

## 2015-02-06 NOTE — ED Notes (Signed)
Patient reports "room feels like it is spinning".

## 2015-02-06 NOTE — Discharge Instructions (Signed)

## 2015-02-06 NOTE — Telephone Encounter (Signed)
Joel Greene called requesting for [name] to be sent to [name] vs. Picking up in our office. Currently in ED dept at [facility] but will soon be leaving

## 2015-02-06 NOTE — Telephone Encounter (Signed)
Left message stating that we have put his Rx (sonata) at the front desk for pick up.

## 2015-02-06 NOTE — ED Notes (Signed)
Patient reports nausea and sporadic diarrhea x 1 month. Denies vomiting or fevers. Seen Dr. Laural Golden 01/30/15 for same.

## 2015-02-06 NOTE — ED Provider Notes (Signed)
CSN: [id number]     Arrival date & time 02/06/15  1309 History   First MD Initiated Contact with Patient 02/06/15 1324     Chief Complaint  Patient presents with  . Nausea     (Consider location/radiation/quality/duration/timing/severity/associated sxs/prior Treatment) HPI Comments: The patient is a 64 year old male, he has a history of irritable bowel syndrome, leukopenia, recently evaluated by gastroenterology for diarrhea and constipation that up and going back and forth. He states that he has been dizzy for the last month, he feels that this is a feeling of being off balance, foggy, has difficulty ambulating because of his imbalance, this is also intermittent. It does seem to be worse when he is walking. He denies any syncopal episodes chest pain shortness of breath or back pain. He does not have any numbness or weakness, there is no double vision, he denies vertigo. When I ask him if he feels like the room is spinning he states now it is not spinning I just feel dizzy headed. He has not had any labs or imaging ordered for evaluation of these complaints. He was recently placed on dicyclomine to treat his irritable bowel syndrome. He denies blood in his stools. He does report a pain in his teeth as well as a fullness in the front of his face like his "head is heavy"  The history is provided by the patient.    Past Medical History  Diagnosis Date  . IBS (irritable bowel syndrome)   . Glaucoma   . Leukopenia 10/23/2011    Decrease in lymphocytes dating back to 1995.  Marland Kitchen History of tobacco abuse 10/23/2011    History of smoking for 10 years, a pack per day, quitting at age 81.  . Bronchitis   . Renal calculi    Past Surgical History  Procedure Laterality Date  . Cystography    . Appendectomy    . Rotator cuff repair      bilateral shoulders  . Refractive surgery      "to relieve glaucoma"  . Colonoscopy N/A 09/01/2012    Procedure: COLONOSCOPY;  Surgeon: [name], MD;  [facility]  [facility];  Service: Endoscopy;  Laterality: N/A;  1030   Family History  Problem Relation Age of Onset  . Breast cancer Mother   . Kidney Stones Brother    History  Substance Use Topics  . Smoking status: Former Smoker -- 2.00 packs/day for 15 years    Types: Cigarettes    Quit date: 07/01/1983  . Smokeless tobacco: Never Used  . Alcohol Use: 0.0 oz/week    0 Standard drinks or equivalent per week     Comment: occasional    Review of Systems  All other systems reviewed and are negative.     Allergies  Clams; Tetracyclines & related; and Tylenol  Home Medications   Prior to Admission medications   Medication Sig Start Date End Date Taking? Authorizing Provider  ALPRAZolam Duanne Moron) 1 MG tablet Take 1 mg by mouth at bedtime as needed for anxiety.    Yes Historical Provider, MD  aspirin 81 MG tablet Take 81 mg by mouth daily.   Yes Historical Provider, MD  Aspirin-Acetaminophen-Caffeine (GOODY HEADACHE PO) Take 1 Package by mouth as needed (headache).    Yes Historical Provider, MD  dicyclomine (BENTYL) 10 MG capsule Take 1 capsule (10 mg total) by mouth 3 (three) times daily. 01/30/15  Yes Butch Penny, NP  dimenhyDRINATE (DRAMAMINE) 50 MG tablet Take 50 mg by  mouth every 8 (eight) hours as needed.   Yes Historical Provider, MD  Multiple Vitamin (MULITIVITAMIN WITH MINERALS) TABS Take 1 tablet by mouth daily.   Yes Historical Provider, MD  tamsulosin (FLOMAX) 0.4 MG CAPS capsule Take 1 capsule by mouth daily. 07/13/13  Yes Historical Provider, MD  zaleplon ([name]) 5 MG capsule Take 1 capsule (5 mg total) by mouth at bedtime as needed for sleep. Take no later than 3 AM to use for sleep maintenance. Do not combine with Ambien! 02/06/15  Yes [name], MD  zolpidem (AMBIEN) 10 MG tablet Take 10 mg by mouth at bedtime as needed for sleep.    Yes Historical Provider, MD  amoxicillin-clavulanate (AUGMENTIN) 875-125 MG per tablet Take 1 tablet by mouth every 12 (twelve) hours.  02/06/15   Noemi Chapel, MD   BP 140/87 mmHg  Pulse 55  Temp(Src) 97.6 F (36.4 C) (Oral)  Resp 14  Ht 5\' 10"  (1.778 m)  Wt 200 lb (90.719 kg)  BMI 28.70 kg/m2  SpO2 99% Physical Exam  Constitutional: He appears well-developed and well-nourished. No distress.  HENT:  Head: Normocephalic and atraumatic.  Mouth/Throat: Oropharynx is clear and moist. No oropharyngeal exudate.  Normal tympanic membranes, clear oropharynx, nares are patent, turbinates are mildly swollen bilaterally, no discharge. Bilateral sinus tenderness over the frontal but more the maxillary sinuses bilaterally left greater than right  Eyes: Conjunctivae and EOM are normal. Pupils are equal, round, and reactive to light. Right eye exhibits no discharge. Left eye exhibits no discharge. No scleral icterus.  No nystagmus  Neck: Normal range of motion. Neck supple. No JVD present. No thyromegaly present.  No trismus or torticollis  Cardiovascular: Normal rate, regular rhythm, normal heart sounds and intact distal pulses.  Exam reveals no gallop and no friction rub.   No murmur heard. Pulmonary/Chest: Effort normal and breath sounds normal. No respiratory distress. He has no wheezes. He has no rales.  Abdominal: Soft. He exhibits no distension and no mass. There is no tenderness.  Increased bowel sounds, no abdominal tenderness  Musculoskeletal: Normal range of motion. He exhibits no edema or tenderness.  Lymphadenopathy:    He has no cervical adenopathy.  Neurological: He is alert. Coordination normal.  Speech is clear, cranial nerves III through XII are intact, memory is intact, strength is normal in all 4 extremities including grips, sensation is intact to light touch and pinprick in all 4 extremities. Coordination as tested by finger-nose-finger is normal, no limb ataxia. Normal gait, normal reflexes at the patellar tendons bilaterally  Skin: Skin is warm and dry. No rash noted. No erythema.  Psychiatric: He has a normal  mood and affect. His behavior is normal.  Nursing note and vitals reviewed.   ED Course  Procedures (including critical care time) Labs Review Labs Reviewed  CBC WITH DIFFERENTIAL/PLATELET - Abnormal; Notable for the following:    MCH 34.3 (*)    MCHC 36.2 (*)    All other components within normal limits  COMPREHENSIVE METABOLIC PANEL - Abnormal; Notable for the following:    Glucose, Bld 121 (*)    All other components within normal limits  URINALYSIS, ROUTINE W REFLEX MICROSCOPIC (NOT AT [facility]) - Abnormal; Notable for the following:    Specific Gravity, Urine >1.030 (*)    Hgb urine dipstick TRACE (*)    All other components within normal limits  LIPASE, BLOOD  URINE MICROSCOPIC-ADD ON    Imaging Review Mr Brain Wo Contrast  02/06/2015   CLINICAL  DATA:  64 year old male with syncope and weakness for 1 month. Initial encounter.  EXAM: MRI HEAD WITHOUT CONTRAST  TECHNIQUE: Multiplanar, multiecho pulse sequences of the brain and surrounding structures were obtained without intravenous contrast.  COMPARISON:  No comparison brain MR. Comparison cervical spine MR 05/13/2014 comparison head CT 11/18/2013.  FINDINGS: No acute infarct.  No intracranial hemorrhage.  No hydrocephalus.  No intracranial mass lesion noted on this unenhanced exam.  Minimal white matter changes probably related to result of small vessel disease.  Major intracranial vascular structures are patent. Slightly bulbous appearance of the basilar tip (series 9, image 16). Tiny aneurysm not excluded although it is possible this represents origin of a vessel.  Cervical medullary junction, pineal region and orbital structures unremarkable.  Small pituitary gland probably an incidental finding.  IMPRESSION: No acute infarct.  No intracranial hemorrhage.  No hydrocephalus.  No intracranial mass lesion noted on this unenhanced exam.  Minimal white matter changes probably related to result of small vessel disease.  Slightly bulbous  appearance of the basilar tip (series 9, image 16). Tiny aneurysm not excluded although it is possible this represents origin of a vessel.  Small pituitary gland probably an incidental finding.   Electronically Signed   By: Genia Del M.D.   On: 02/06/2015 14:58     EKG Interpretation   Date/Time:  Tuesday February 06 2015 13:49:08 EDT Ventricular Rate:  48 PR Interval:  203 QRS Duration: 98 QT Interval:  436 QTC Calculation: 389 R Axis:   63 Text Interpretation:  Sinus bradycardia ECG OTHERWISE WITHIN NORMAL LIMITS  No old tracing to compare Confirmed by Jacquelynne Guedes  MD, Mackey ([id number]) on  02/06/2015 2:41:21 PM      MDM   Final diagnoses:  Dizziness  Acute sinusitis, recurrence not specified, unspecified location  Diarrhea    Benign Neuro exam - possible sinusitis - would also consider brain pathology - the GI sx are more chronic in nature and will order labs but likely IBS - pt in agreement.  Filed Vitals:   02/06/15 1315  BP: 143/83  Pulse: 61  Temp: 97.6 F (36.4 C)  Resp: 16   MRI normal , labs unremarkalb e- pt given copy of labse - VS normal, stable for d/c.  tx for sinusitis - GI sx will have ongoing tx with probiotic and bentyl  Meds given in ED:  Medications  amoxicillin-clavulanate (AUGMENTIN) 875-125 MG per tablet 1 tablet (1 tablet Oral Given 02/06/15 1517)    New Prescriptions   AMOXICILLIN-CLAVULANATE (AUGMENTIN) 875-125 MG PER TABLET    Take 1 tablet by mouth every 12 (twelve) hours.        Noemi Chapel, MD 02/06/15 [phone]

## 2015-02-28 DIAGNOSIS — R361 Hematospermia: Secondary | ICD-10-CM | POA: Diagnosis not present

## 2015-02-28 DIAGNOSIS — N23 Unspecified renal colic: Secondary | ICD-10-CM | POA: Diagnosis not present

## 2015-02-28 DIAGNOSIS — R3914 Feeling of incomplete bladder emptying: Secondary | ICD-10-CM | POA: Diagnosis not present

## 2015-02-28 DIAGNOSIS — R350 Frequency of micturition: Secondary | ICD-10-CM | POA: Diagnosis not present

## 2015-02-28 DIAGNOSIS — R310 Gross hematuria: Secondary | ICD-10-CM | POA: Diagnosis not present

## 2015-02-28 DIAGNOSIS — R3915 Urgency of urination: Secondary | ICD-10-CM | POA: Diagnosis not present

## 2015-02-28 DIAGNOSIS — Z125 Encounter for screening for malignant neoplasm of prostate: Secondary | ICD-10-CM | POA: Diagnosis not present

## 2015-02-28 DIAGNOSIS — R351 Nocturia: Secondary | ICD-10-CM | POA: Diagnosis not present

## 2015-03-06 DIAGNOSIS — N200 Calculus of kidney: Secondary | ICD-10-CM | POA: Diagnosis not present

## 2015-03-15 DIAGNOSIS — Z0000 Encounter for general adult medical examination without abnormal findings: Secondary | ICD-10-CM | POA: Diagnosis not present

## 2015-03-15 DIAGNOSIS — G4730 Sleep apnea, unspecified: Secondary | ICD-10-CM | POA: Diagnosis not present

## 2015-03-15 DIAGNOSIS — F419 Anxiety disorder, unspecified: Secondary | ICD-10-CM | POA: Diagnosis not present

## 2015-03-15 DIAGNOSIS — E663 Overweight: Secondary | ICD-10-CM | POA: Diagnosis not present

## 2015-03-15 DIAGNOSIS — Z6828 Body mass index (BMI) 28.0-28.9, adult: Secondary | ICD-10-CM | POA: Diagnosis not present

## 2015-03-16 DIAGNOSIS — R310 Gross hematuria: Secondary | ICD-10-CM | POA: Diagnosis not present

## 2015-03-16 DIAGNOSIS — M5489 Other dorsalgia: Secondary | ICD-10-CM | POA: Diagnosis not present

## 2015-03-16 DIAGNOSIS — R319 Hematuria, unspecified: Secondary | ICD-10-CM | POA: Diagnosis not present

## 2015-03-19 ENCOUNTER — Other Ambulatory Visit: Payer: Self-pay | Admitting: Urology

## 2015-03-19 DIAGNOSIS — R310 Gross hematuria: Secondary | ICD-10-CM

## 2015-03-22 ENCOUNTER — Ambulatory Visit
Admission: RE | Admit: 2015-03-22 | Discharge: 2015-03-22 | Disposition: A | Discharge status: Home / Self Care | Payer: Medicare Other | Source: Ambulatory Visit | Attending: Urology | Admitting: Urology

## 2015-03-22 DIAGNOSIS — R310 Gross hematuria: Secondary | ICD-10-CM | POA: Diagnosis not present

## 2015-03-22 DIAGNOSIS — N200 Calculus of kidney: Secondary | ICD-10-CM | POA: Insufficient documentation

## 2015-03-22 MED ORDER — IOHEXOL 300 MG/ML  SOLN
125.0000 mL | Freq: Once | INTRAMUSCULAR | Status: AC | PRN
  Administered 2015-03-22: 125 mL via INTRAVENOUS

## 2015-03-26 DIAGNOSIS — R310 Gross hematuria: Secondary | ICD-10-CM | POA: Diagnosis not present

## 2015-03-26 DIAGNOSIS — N200 Calculus of kidney: Secondary | ICD-10-CM | POA: Diagnosis not present

## 2015-04-10 DIAGNOSIS — R310 Gross hematuria: Secondary | ICD-10-CM | POA: Diagnosis not present

## 2015-04-10 DIAGNOSIS — N200 Calculus of kidney: Secondary | ICD-10-CM | POA: Diagnosis not present

## 2015-04-18 ENCOUNTER — Other Ambulatory Visit: Payer: Self-pay | Admitting: Neurology

## 2015-05-14 DIAGNOSIS — Z125 Encounter for screening for malignant neoplasm of prostate: Secondary | ICD-10-CM | POA: Diagnosis not present

## 2015-05-14 DIAGNOSIS — Z6826 Body mass index (BMI) 26.0-26.9, adult: Secondary | ICD-10-CM | POA: Diagnosis not present

## 2015-05-14 DIAGNOSIS — E785 Hyperlipidemia, unspecified: Secondary | ICD-10-CM | POA: Diagnosis not present

## 2015-05-14 DIAGNOSIS — Z0000 Encounter for general adult medical examination without abnormal findings: Secondary | ICD-10-CM | POA: Diagnosis not present

## 2015-05-14 DIAGNOSIS — J3489 Other specified disorders of nose and nasal sinuses: Secondary | ICD-10-CM | POA: Diagnosis not present

## 2015-05-14 DIAGNOSIS — E663 Overweight: Secondary | ICD-10-CM | POA: Diagnosis not present

## 2015-05-14 DIAGNOSIS — Z1389 Encounter for screening for other disorder: Secondary | ICD-10-CM | POA: Diagnosis not present

## 2015-05-29 DIAGNOSIS — R5383 Other fatigue: Secondary | ICD-10-CM | POA: Diagnosis not present

## 2015-05-29 DIAGNOSIS — Z6826 Body mass index (BMI) 26.0-26.9, adult: Secondary | ICD-10-CM | POA: Diagnosis not present

## 2015-05-29 DIAGNOSIS — F419 Anxiety disorder, unspecified: Secondary | ICD-10-CM | POA: Diagnosis not present

## 2015-05-29 DIAGNOSIS — F329 Major depressive disorder, single episode, unspecified: Secondary | ICD-10-CM | POA: Diagnosis not present

## 2015-05-29 DIAGNOSIS — Z1389 Encounter for screening for other disorder: Secondary | ICD-10-CM | POA: Diagnosis not present

## 2015-05-29 DIAGNOSIS — E559 Vitamin D deficiency, unspecified: Secondary | ICD-10-CM | POA: Diagnosis not present

## 2015-06-06 ENCOUNTER — Emergency Department (HOSPITAL_COMMUNITY): Payer: Medicare Other

## 2015-06-06 ENCOUNTER — Encounter (HOSPITAL_COMMUNITY): Payer: Self-pay

## 2015-06-06 ENCOUNTER — Emergency Department (HOSPITAL_COMMUNITY)
Admission: EM | Admit: 2015-06-06 | Discharge: 2015-06-06 | Disposition: A | Discharge status: Home / Self Care | Payer: Medicare Other | Attending: Emergency Medicine | Admitting: Emergency Medicine

## 2015-06-06 DIAGNOSIS — K589 Irritable bowel syndrome without diarrhea: Secondary | ICD-10-CM | POA: Insufficient documentation

## 2015-06-06 DIAGNOSIS — Z7982 Long term (current) use of aspirin: Secondary | ICD-10-CM | POA: Insufficient documentation

## 2015-06-06 DIAGNOSIS — R1011 Right upper quadrant pain: Secondary | ICD-10-CM | POA: Diagnosis not present

## 2015-06-06 DIAGNOSIS — Z79899 Other long term (current) drug therapy: Secondary | ICD-10-CM | POA: Insufficient documentation

## 2015-06-06 DIAGNOSIS — F419 Anxiety disorder, unspecified: Secondary | ICD-10-CM | POA: Insufficient documentation

## 2015-06-06 DIAGNOSIS — Z7951 Long term (current) use of inhaled steroids: Secondary | ICD-10-CM | POA: Insufficient documentation

## 2015-06-06 DIAGNOSIS — Z8669 Personal history of other diseases of the nervous system and sense organs: Secondary | ICD-10-CM | POA: Diagnosis not present

## 2015-06-06 DIAGNOSIS — N200 Calculus of kidney: Secondary | ICD-10-CM | POA: Diagnosis not present

## 2015-06-06 DIAGNOSIS — R109 Unspecified abdominal pain: Secondary | ICD-10-CM

## 2015-06-06 DIAGNOSIS — R197 Diarrhea, unspecified: Secondary | ICD-10-CM | POA: Diagnosis not present

## 2015-06-06 DIAGNOSIS — Z87442 Personal history of urinary calculi: Secondary | ICD-10-CM | POA: Insufficient documentation

## 2015-06-06 DIAGNOSIS — R110 Nausea: Secondary | ICD-10-CM | POA: Diagnosis not present

## 2015-06-06 DIAGNOSIS — Z8709 Personal history of other diseases of the respiratory system: Secondary | ICD-10-CM | POA: Insufficient documentation

## 2015-06-06 LAB — LIPASE, BLOOD: Lipase: 41 U/L (ref 11–51)

## 2015-06-06 LAB — URINALYSIS, ROUTINE W REFLEX MICROSCOPIC
Bilirubin Urine: NEGATIVE
Glucose, UA: NEGATIVE mg/dL
Hgb urine dipstick: NEGATIVE
Leukocytes, UA: NEGATIVE
NITRITE: NEGATIVE
Protein, ur: NEGATIVE mg/dL
Specific Gravity, Urine: >1.03 — ABNORMAL HIGH (ref 1.005–1.030)
pH: 6 (ref 5.0–8.0)

## 2015-06-06 LAB — COMPREHENSIVE METABOLIC PANEL
ALBUMIN: 4.5 g/dL (ref 3.5–5.0)
ALK PHOS: 63 U/L (ref 38–126)
ALT: 32 U/L (ref 17–63)
ANION GAP: 11 (ref 5–15)
AST: 19 U/L (ref 15–41)
BILIRUBIN TOTAL: 0.9 mg/dL (ref 0.3–1.2)
BUN: 14 mg/dL (ref 6–20)
CALCIUM: 9.8 mg/dL (ref 8.9–10.3)
CO2: 25 mmol/L (ref 22–32)
Chloride: 104 mmol/L (ref 101–111)
Creatinine, Ser: 1.01 mg/dL (ref 0.61–1.24)
GFR calc non Af Amer: >60 mL/min (ref >60)
Glucose, Bld: 110 mg/dL — ABNORMAL HIGH (ref 65–99)
Potassium: 3.6 mmol/L (ref 3.5–5.1)
Sodium: 140 mmol/L (ref 135–145)
TOTAL PROTEIN: 7 g/dL (ref 6.5–8.1)

## 2015-06-06 LAB — CBC WITH DIFFERENTIAL/PLATELET
Basophils Absolute: 0 10*3/uL (ref 0.0–0.1)
Basophils Relative: 1 %
EOS PCT: 1 %
Eosinophils Absolute: 0 10*3/uL (ref 0.0–0.7)
HEMATOCRIT: 44.9 % (ref 39.0–52.0)
HEMOGLOBIN: 16.5 g/dL (ref 13.0–17.0)
LYMPHS ABS: 0.7 10*3/uL (ref 0.7–4.0)
Lymphocytes Relative: 16 %
MCH: 33.4 pg (ref 26.0–34.0)
MCHC: 36.7 g/dL — ABNORMAL HIGH (ref 30.0–36.0)
MCV: 90.9 fL (ref 78.0–100.0)
Monocytes Absolute: 0.4 10*3/uL (ref 0.1–1.0)
Monocytes Relative: 9 %
NEUTROS ABS: 3.1 10*3/uL (ref 1.7–7.7)
NEUTROS PCT: 74 %
Platelets: 199 10*3/uL (ref 150–400)
RBC: 4.94 MIL/uL (ref 4.22–5.81)
RDW: 11.9 % (ref 11.5–15.5)
WBC: 4.3 10*3/uL (ref 4.0–10.5)

## 2015-06-06 MED ORDER — DICYCLOMINE HCL 20 MG PO TABS: 20.0000 mg | ORAL_TABLET | Freq: Four times a day (QID) | ORAL | Status: DC | PRN

## 2015-06-06 MED ORDER — ONDANSETRON HCL 4 MG/2ML IJ SOLN
4.0000 mg | INTRAMUSCULAR | Status: AC | PRN
  Administered 2015-06-06 (×2): 4 mg via INTRAVENOUS
  Filled 2015-06-06 (×2): qty 2

## 2015-06-06 MED ORDER — MORPHINE SULFATE (PF) 4 MG/ML IV SOLN
4.0000 mg | INTRAVENOUS | Status: AC | PRN
  Administered 2015-06-06 (×2): 4 mg via INTRAVENOUS
  Filled 2015-06-06 (×2): qty 1

## 2015-06-06 MED ORDER — DICYCLOMINE HCL 10 MG/ML IM SOLN
20.0000 mg | Freq: Once | INTRAMUSCULAR | Status: AC
  Administered 2015-06-06: 20 mg via INTRAMUSCULAR
  Filled 2015-06-06: qty 2

## 2015-06-06 MED ORDER — SODIUM CHLORIDE 0.9 % IV SOLN
INTRAVENOUS | Status: DC
  Administered 2015-06-06: 18:00:00 via INTRAVENOUS

## 2015-06-06 MED ORDER — ONDANSETRON 4 MG PO TBDP: 4.0000 mg | ORAL_TABLET | Freq: Three times a day (TID) | ORAL | Status: DC | PRN

## 2015-06-06 NOTE — Discharge Instructions (Signed)
°Emergency Department Resource Guide °1) Find a Doctor and Pay Out of Pocket °Although you won't have to find out who is covered by your insurance plan, it is a good idea to ask around and get recommendations. You will then need to call the office and see if the doctor you have chosen will accept you as a new patient and what types of options they offer for patients who are self-pay. Some doctors offer discounts or will set up payment plans for their patients who do not have insurance, but you will need to ask so you aren't surprised when you get to your appointment. ° °2) Contact Your Local Health Department °Not all health departments have doctors that can see patients for sick visits, but many do, so it is worth a call to see if yours does. If you don't know where your local health department is, you can check in your phone book. The CDC also has a tool to help you locate your state's health department, and many state websites also have listings of all of their local health departments. ° °3) Find a Walk-in Clinic °If your illness is not likely to be very severe or complicated, you may want to try a walk in clinic. These are popping up all over the country in pharmacies, drugstores, and shopping centers. They're usually staffed by nurse practitioners or physician assistants that have been trained to treat common illnesses and complaints. They're usually fairly quick and inexpensive. However, if you have serious medical issues or chronic medical problems, these are probably not your best option. ° °No Primary Care Doctor: °- Call [facility] Connect at  832-8000 - they can help you locate a primary care doctor that  accepts your insurance, provides certain services, etc. °- Physician Referral Service- 1-800-533-3463 ° °Chronic Pain Problems: °Organization         Address  Phone   Notes  °[facility]  ([phone]) [phone] Patients need to be referred by their primary care doctor.  ° °Medication  Assistance: °Organization         Address  Phone   Notes  °[place] [facility] [address] °[place], [place] [address] (336) 641-8030 --Must be a resident of [place] °-- Must have NO insurance coverage whatsoever (no Medicaid/ Medicare, etc.) °-- The pt. MUST have a primary care doctor that directs their care regularly and follows them in the community °  °[facility]  (866) 331-1348   °[employer]  (888) 892-1162   ° °Agencies that provide inexpensive medical care: °Organization         Address  Phone   Notes  °[facility]  (336) 832-8035   °[facility]    (336) 832-7272   °[facility] [address] °[place], [place] [address] (336) 832-4777   °[facility] [address], °[place] (336) 271-4999   °[facility]    (336) 373-0678   °[facility]    (336) 272-1050   °[facility] ° [address], [place] Phone:  (336) 832-4444, Fax:  (336) 832-4440 Hours of Operation:  9 am - 6 pm, M-F.  Also accepts Medicaid/Medicare and self-pay.  °[facility] ° [address], [place] Phone: (336) 832-3150, Fax: (336) [phone]. Hours of Operation:  8:30 am - 5:30 pm, M-F.  Also accepts Medicaid and self-pay.  °[facility] [place] [address], [place] Phone: (336) 878-6027   °[facility] [address], [place], [place] (336)723-1848, Ext. 123 Mondays & Thursdays: 7-9 AM.  First 15 patients are seen on a first come, first serve basis. °  ° °Medicaid-accepting [place] Providers: ° °Organization         Address  Phone   Notes  °[facility] [address], [place] (336) 641-2100 Also accepts self-pay patients.  °[facility] [address], [place] ° (336) 856-9996   °[facility] [address], [place]  (336) 288-8857   °[facility] [address] [place] [address], [place] (336) 299-7000   °Veita Bland [address], [place]  ° (336) 373-1557 Only accepts [place] Access Medicaid patients after they have their name applied to their card.  ° °Self-Pay (no insurance) in [place]: ° °Organization         Address  Phone   Notes  °Sickle Cell Patients, [facility] [address], [place] (336) 832-1970   °[facility] [address], [place] (336) 832-4400   °[facility] [place] ° [address], [place] (336) 992-4800   °[facility]/Dr. Osei-Bonsu ° [address] [place] [address], [place] or [address], [place] (336) [phone] Phone number for both [place] and [place] locations is the same.  °[facility] [address], [place] (336) 299-0000   °[facility] [address] [place] [address], [place] or [address] (336) 852-7530 °(336) 878-2260   °[facility] [address], [place] (336) 350-1642, phone; (336) 294-5005, fax Sees patients 1st and 3rd Saturday of every month.  Must not qualify for public or private insurance (i.e. Medicaid, Medicare, [place] Health Choice, Veterans' Benefits) • Household income should be no more than 200% of the poverty level •The clinic cannot treat you if you are pregnant or think you are pregnant • Sexually transmitted diseases are not treated at the clinic.  ° ° °Dental Care: °Organization         Address  Phone  Notes  °[place] [facility] [address], [place] (336) 641-6152 Accepts children up to age 21 who are enrolled in Medicaid or [place] Health Choice; pregnant women with a Medicaid card; and children who have applied for Medicaid or [place] Health Choice, but were declined, whose parents can pay a reduced fee at time of service.  °[place]  [facility] [place]  [address], [place] (336) 641-7733 Accepts children up to age 21 who are enrolled in Medicaid or [place] Health Choice; pregnant women with a Medicaid card; and children who have applied for Medicaid or [place] Health Choice, but were declined, whose parents can pay a reduced fee at time of service.  °[facility] ° [address],  (336) 641-4533 Patients are seen by appointment only. Walk-ins are not accepted. [facility] will see patients 18 years of age and older. °Monday - Tuesday (8am-5pm) °Most Wednesdays (8:30-5pm) °$30 per visit, cash only  °[facility] ° [address], [place] (336) 641-4533 Patients are seen by appointment only. Walk-ins are not accepted. [facility] will see patients 18 years of age and older. °One   Wednesday Evening (Monthly: Volunteer Based).  $30 per visit, cash only  °[facility]  (919) 537-3737 for adults; Children under age 4, call Graduate Pediatric Dentistry at ([phone]) [phone]. Children aged 4-14, please call ([phone]) [phone] to request a pediatric application. ° Dental services are provided in all areas of dental care including fillings, crowns and bridges, complete and partial dentures, implants, gum treatment, root canals, and extractions. Preventive care is also provided. Treatment is provided to both adults and children. °Patients are selected via a lottery and there is often a waiting list. °  °[facility] [address], °[place] ° (336) 763-8833 [email or web address] °  °[facility] [address], [place], [place] (336)723-1848, Ext. 123 Second and Fourth Thursday of each month, opens at 6:30 AM; Clinic ends at 9 AM.  Patients are seen on a first-come first-served basis, and a limited number are seen during each clinic.  ° °[facility] ° [address], [place], [place] (336) [phone]    Eligibility Requirements °You must have lived in [place], [place], or [place] counties for at least the last three months. °  You cannot be eligible for state or federal sponsored healthcare insurance, including Veterans Administration, Medicaid, or Medicare. °  You generally cannot be eligible for healthcare insurance through your employer.  °  How to apply: °Eligibility screenings are held every Tuesday and Wednesday afternoon from 1:00 pm until 4:00 pm. You do not need an appointment for the interview!  °[facility] [address], [place], [place] 336-631-2330   °[facility]  336-342-8273   °[place]  336-703-3100   °[facility]  336-570-6415   ° °Behavioral Health Resources in the Community: °Intensive Outpatient Programs °Organization         Address  Phone  Notes  °[facility] [address], [place], [place] 336-878-6098   °[facility] [address], [place], [place] 336-832-9800   °[facility] [address], [place], [place] ° 336-882-2125   °[facility] [address],  °[place], [place] 1-800-853-5163 or 336-641-4981   °Substance Abuse Resources °Organization         Address  Phone  Notes  °[facility]  336-882-2125   °[facility]  336-784-9470   °[facility]  336-285-9073   °[facility]  336-845-3988   °Residential & Outpatient Substance Abuse Program  1-800-659-3381   °Psychological Services °Organization         Address  Phone  Notes  °[facility]  336- 832-9600   °[facility]  336- 378-7881   °[facility] [address], [place] 1-800-853-5163 or 336-641-4981   ° °Mobile Crisis Teams °Organization         Address  Phone  Notes  °[facility], Mobile Crisis Care Unit  1-877-626-1772   °Assertive °[facility] ° [address]  [place], [place] 336-834-9664   °Sharon DeEsch [address] °[place]  336-554-5454   ° °Self-Help/Support Groups °Organization         Address  Phone             Notes  °[facility]  - variety of support groups  336- 373-1402 Call for more information  °Narcotics Anonymous (NA), [facility] [address], °[place]   2 meetings at this location  ° °  Residential Treatment Programs Organization         Address  Phone  Notes  [facility] [address],    [place]  1-(347)105-3271   [facility]  [address] [place] [id number], [place], [place]   [facility], [place] 678-732-9296 Admissions: 8am-3pm M-F  [facility] [place] [address]    [place], [place] X4321937   [facility] [address] [place], [place], [place]   [facility] [address],  [place], [place]   [facility] Intensive Outpatient [address], [place], [place]   [facility] ([place] [address]  [place], [place] 1-820-024-8784 or 331-751-1972   [facility] ([facility] [address] [place], [place] Accepts Medicaid  [facility] [address],  [place] 1-254 687 0414 Substance Abuse/Addiction Treatment   [facility] Organization         Address  Phone  Notes  [facility]  941-262-1616   Domenic Schwab, PhD [address] [place], [place]   (713)797-7197 or 854-139-5196   [facility] [place] [place], [place] [phone]   [facility] [address] [place], [place] [phone] Insurance/Medicaid/sponsorship through [facility] [address]                                    [place], [place] [phone] [place] [address] [place], [place] [phone]    Dr. Adele Schilder  [phone]   [facility] Dept. 1) [address] [place] 2) [place] 3)  [address], [place] 567-541-0849 [phone]  250-734-5818   [place] (250)351-9507 or (631)[phone] (After Hours)      Take the prescriptions as directed.  Increase your fluid intake (ie:  Gatoraide) for the next few days. Keep your appointment with your GI doctor tomorrow as previously scheduled. Return to the Emergency Department immediately if not improving (or even worsening) despite taking the medicines as prescribed, any black or bloody stool or vomit, if you develop a fever over "101," or for any other concerns.

## 2015-06-06 NOTE — ED Notes (Signed)
Pt reports thinks is having a sever gall bladder attack.  Reports severe pain in ruq.  Pt says has had RUQ pain since August but just got severe today.  Reports severe pain and nausea.  No vomiting.  Pt says he doesn't throw up.  Reports intermittent diarrhea.  Daughter says pt has had everything checked except for his gall bladder.

## 2015-06-06 NOTE — ED Provider Notes (Signed)
CSN: [id number]     Arrival date & time 06/06/15  1448 History   First MD Initiated Contact with Patient 06/06/15 1642     Chief Complaint  Patient presents with  . Abdominal Pain      HPI Pt was seen at 1655.   Per pt, c/o gradual onset and persistence of intermittent RUQ abd "pain" for the past 4+ months, worse over the past 4 days.  Has been associated with nausea and multiple intermittent episodes of diarrhea.  Describes the abd pain as "bloating." Symptoms worsen with eating. Denies vomiting, no fevers, no back pain, no rash, no CP/SOB, no black or blood in stools.      GI: Rehman Past Medical History  Diagnosis Date  . IBS (irritable bowel syndrome)   . Glaucoma   . Leukopenia 10/23/2011    Decrease in lymphocytes dating back to 1995.  Marland Kitchen History of tobacco abuse 10/23/2011    History of smoking for 10 years, a pack per day, quitting at age 45.  . Bronchitis   . Renal calculi    Past Surgical History  Procedure Laterality Date  . Cystography    . Appendectomy    . Rotator cuff repair      bilateral shoulders  . Refractive surgery      "to relieve glaucoma"  . Colonoscopy N/A 09/01/2012    Procedure: COLONOSCOPY;  Surgeon: [name], MD;  Location: [facility];  Service: Endoscopy;  Laterality: N/A;  1030   Family History  Problem Relation Age of Onset  . Breast cancer Mother   . Kidney Stones Brother    Social History  Substance Use Topics  . Smoking status: Former Smoker -- 2.00 packs/day for 15 years    Types: Cigarettes    Quit date: 07/01/1983  . Smokeless tobacco: Never Used  . Alcohol Use: 0.0 oz/week    0 Standard drinks or equivalent per week     Comment: occasional    Review of Systems ROS: Statement: All systems negative except as marked or noted in the HPI; Constitutional: Negative for fever and chills. ; ; Eyes: Negative for eye pain, redness and discharge. ; ; ENMT: Negative for ear pain, hoarseness, nasal congestion, sinus pressure and sore  throat. ; ; Cardiovascular: Negative for chest pain, palpitations, diaphoresis, dyspnea and peripheral edema. ; ; Respiratory: Negative for cough, wheezing and stridor. ; ; Gastrointestinal: +nausea, diarrhea, abd pain. Negative for vomiting, blood in stool, hematemesis, jaundice and rectal bleeding. . ; ; Genitourinary: Negative for dysuria, flank pain and hematuria. ; ; Musculoskeletal: Negative for back pain and neck pain. Negative for swelling and trauma.; ; Skin: Negative for pruritus, rash, abrasions, blisters, bruising and skin lesion.; ; Neuro: Negative for headache, lightheadedness and neck stiffness. Negative for weakness, altered level of consciousness , altered mental status, extremity weakness, paresthesias, involuntary movement, seizure and syncope.      Allergies  Clams; Tetracyclines & related; and Tylenol  Home Medications   Prior to Admission medications   Medication Sig Start Date End Date Taking? Authorizing Provider  ALPRAZolam Duanne Moron) 1 MG tablet Take 1 mg by mouth at bedtime as needed for anxiety.     Historical Provider, MD  amoxicillin-clavulanate (AUGMENTIN) 875-125 MG per tablet Take 1 tablet by mouth every 12 (twelve) hours. 02/06/15   Noemi Chapel, MD  aspirin 81 MG tablet Take 81 mg by mouth daily.    Historical Provider, MD  Aspirin-Acetaminophen-Caffeine (GOODY HEADACHE PO) Take 1 Package by  mouth as needed (headache).     Historical Provider, MD  dicyclomine (BENTYL) 10 MG capsule Take 1 capsule (10 mg total) by mouth 3 (three) times daily. 01/30/15   Butch Penny, NP  dimenhyDRINATE (DRAMAMINE) 50 MG tablet Take 50 mg by mouth every 8 (eight) hours as needed.    Historical Provider, MD  fluticasone (FLONASE) 50 MCG/ACT nasal spray INHALE 2 SPRAYS IN EACH NOSTRIL ONCE DAILY. 04/18/15   [name], MD  Multiple Vitamin (MULITIVITAMIN WITH MINERALS) TABS Take 1 tablet by mouth daily.    Historical Provider, MD  tamsulosin (FLOMAX) 0.4 MG CAPS capsule Take 1 capsule by  mouth daily. 07/13/13   Historical Provider, MD  zaleplon ([name]) 5 MG capsule Take 1 capsule (5 mg total) by mouth at bedtime as needed for sleep. Take no later than 3 AM to use for sleep maintenance. Do not combine with Ambien! 02/06/15   [name], MD  zolpidem (AMBIEN) 10 MG tablet Take 10 mg by mouth at bedtime as needed for sleep.     Historical Provider, MD   BP 140/79 mmHg  Pulse 65  Temp(Src) 97.6 F (36.4 C) (Oral)  Resp 22  Ht 5\' 10"  (1.778 m)  Wt 185 lb (83.915 kg)  BMI 26.54 kg/m2  SpO2 100% Physical Exam  1700: Physical examination:  Nursing notes reviewed; Vital signs and O2 SAT reviewed;  Constitutional: Well developed, Well nourished, Well hydrated, In no acute distress; Head:  Normocephalic, atraumatic; Eyes: EOMI, PERRL, No scleral icterus; ENMT: Mouth and pharynx normal, Mucous membranes moist; Neck: Supple, Full range of motion, No lymphadenopathy; Cardiovascular: Regular rate and rhythm, No gallop; Respiratory: Breath sounds clear & equal bilaterally, No wheezes.  Speaking full sentences with ease, Normal respiratory effort/excursion; Chest: Nontender, Movement normal; Abdomen: Soft, +RUQ tenderness to palp. Nondistended, Normal bowel sounds; Genitourinary: No CVA tenderness; Extremities: Pulses normal, No tenderness, No edema, No calf edema or asymmetry.; Neuro: AA&Ox3, Major CN grossly intact.  Speech clear. No gross focal motor or sensory deficits in extremities.; Skin: Color normal, Warm, Dry.; Psych:  Anxious.    ED Course  Procedures (including critical care time) Labs Review   Imaging Review  I have personally reviewed and evaluated these images and lab results as part of my medical decision-making.   EKG Interpretation None      MDM  MDM Reviewed: previous chart, nursing note and vitals Reviewed previous: CT scan and labs Interpretation: labs, x-ray and ultrasound   Results for orders placed or performed during the hospital encounter of 06/06/15   CBC with Differential  Result Value Ref Range   WBC 4.3 4.0 - 10.5 K/uL   RBC 4.94 4.22 - 5.81 MIL/uL   Hemoglobin 16.5 13.0 - 17.0 g/dL   HCT 44.9 39.0 - 52.0 %   MCV 90.9 78.0 - 100.0 fL   MCH 33.4 26.0 - 34.0 pg   MCHC 36.7 (H) 30.0 - 36.0 g/dL   RDW 11.9 11.5 - 15.5 %   Platelets 199 150 - 400 K/uL   Neutrophils Relative % 74 %   Neutro Abs 3.1 1.7 - 7.7 K/uL   Lymphocytes Relative 16 %   Lymphs Abs 0.7 0.7 - 4.0 K/uL   Monocytes Relative 9 %   Monocytes Absolute 0.4 0.1 - 1.0 K/uL   Eosinophils Relative 1 %   Eosinophils Absolute 0.0 0.0 - 0.7 K/uL   Basophils Relative 1 %   Basophils Absolute 0.0 0.0 - 0.1 K/uL   RBC Morphology  SPHEROCYTES   Comprehensive metabolic panel  Result Value Ref Range   Sodium 140 135 - 145 mmol/L   Potassium 3.6 3.5 - 5.1 mmol/L   Chloride 104 101 - 111 mmol/L   CO2 25 22 - 32 mmol/L   Glucose, Bld 110 (H) 65 - 99 mg/dL   BUN 14 6 - 20 mg/dL   Creatinine, Ser 1.01 0.61 - 1.24 mg/dL   Calcium 9.8 8.9 - 10.3 mg/dL   Total Protein 7.0 6.5 - 8.1 g/dL   Albumin 4.5 3.5 - 5.0 g/dL   AST 19 15 - 41 U/L   ALT 32 17 - 63 U/L   Alkaline Phosphatase 63 38 - 126 U/L   Total Bilirubin 0.9 0.3 - 1.2 mg/dL   GFR calc non Af Amer >60 >60 mL/min   GFR calc Af Amer >60 >60 mL/min   Anion gap 11 5 - 15  Lipase, blood  Result Value Ref Range   Lipase 41 11 - 51 U/L  Urinalysis, Routine w reflex microscopic (not at [facility])  Result Value Ref Range   Color, Urine YELLOW YELLOW   APPearance CLEAR CLEAR   Specific Gravity, Urine >1.030 (H) 1.005 - 1.030   pH 6.0 5.0 - 8.0   Glucose, UA NEGATIVE NEGATIVE mg/dL   Hgb urine dipstick NEGATIVE NEGATIVE   Bilirubin Urine NEGATIVE NEGATIVE   Ketones, ur TRACE (A) NEGATIVE mg/dL   Protein, ur NEGATIVE NEGATIVE mg/dL   Nitrite NEGATIVE NEGATIVE   Leukocytes, UA NEGATIVE NEGATIVE   US Abdomen Limited 06/06/2015  CLINICAL DATA:  Right upper quadrant abdominal pain and nausea. EXAM: US ABDOMEN LIMITED - RIGHT  UPPER QUADRANT COMPARISON:  CT scan 03/22/2015 FINDINGS: Gallbladder: Mildly contracted gallbladder. No wall thickening. No gallstones or pericholecystic fluid. Equivocal sonographic Murphy sign. Common bile duct: Diameter: 3 mm Liver: There is diffuse increased echogenicity of the liver and decreased through transmission consistent with fatty infiltration. No focal lesions or biliary dilatation. IMPRESSION: 1. No gallstones or findings for acute cholecystitis. Mild gallbladder contraction. 2. Normal caliber common bile duct. 3. Mild diffuse fatty infiltration of the liver. Electronically Signed   By: Marijo Sanes M.D.   On: 06/06/2015 17:40   Dg Abd Acute W/chest 06/06/2015  CLINICAL DATA:  Abdominal pain with nausea and diarrhea. Present for several months but more severe past 2 weeks EXAM: DG ABDOMEN ACUTE W/ 1V CHEST COMPARISON:  Chest radiograph January 30, 2013; CT abdomen and pelvis March 22, 2015 FINDINGS: PA chest: There is no edema or consolidation. The heart size and pulmonary vascularity are normal. No adenopathy. Supine and upright abdomen: There is moderate stool throughout colon. There is no bowel dilatation or air-fluid level suggesting obstruction. No free air. There is a 4 mm calculus in the upper to mid right kidney. There are apparent phleboliths in the pelvis. IMPRESSION: No obstruction or free air. Moderate stool throughout colon. Small calculus right kidney. No lung edema or consolidation. Electronically Signed   By: Lowella Grip III M.D.   On: 06/06/2015 18:40    CLINICAL DATA: Gross hematuria for 6 weeks. Right flank pain. Nephrolithiasis. EXAM:  CT ABDOMEN AND PELVIS WITHOUT AND WITH CONTRAST TECHNIQUE: Multidetector CT imaging of the abdomen and pelvis was performed following the standard protocol before and following the bolus administration of intravenous contrast. CONTRAST: 184mL OMNIPAQUE IOHEXOL 300 MG/ML SOLN COMPARISON: 01/30/2013 and  08/26/2011 FINDINGS: Lower chest: No acute findings.  Hepatobiliary: No masses or other significant abnormality.  Pancreas: No mass, inflammatory  changes, or other significant abnormality.  Spleen: Within normal limits in size and appearance.  Adrenals/Urinary Tract: Normal adrenal glands. Small less than 5 mm calculi are seen in both kidneys, right side greater than left. No evidence of ureteral calculi or hydronephrosis.  Several tiny sub-cm renal cysts are noted bilaterally, however there is no evidence of complex cystic or solid renal masses. No masses or calculi seen involving the lower urinary tract.  Stomach/Bowel: No evidence of obstruction, inflammatory process, or abnormal fluid collections.  Vascular/Lymphatic: No pathologically enlarged lymph nodes. No evidence of abdominal aortic aneurysm. Calcificied atherosclerotic plaque noted involving the aorta.  Reproductive: No mass or other significant abnormality.  Other: None.  Musculoskeletal: No suspicious bone lesions identified.  IMPRESSION: Bilateral nonobstructive nephrolithiasis. No evidence of ureteral calculi or hydronephrosis.  No radiographic evidence of urinary tract neoplasm.  Electronically Signed  By: Earle Gell M.D.  On: 03/22/2015 10:08   1930:  Workup reassuring. No clear indication for admission or surgical intervention. Pt's daughter insistent she "wants more gallbladder tests." Informed HIDA scan is not a test within ED scope; f/u GI MD. Pt states he has an appt tomorrow with his GI MD and will ask him about it tomorrow. Pt states he feels better after meds and wants to go home now. Dx and testing d/w pt and family.  Questions answered.  Verb understanding, agreeable to d/c home with outpt f/u.    Francine Graven, DO 06/12/15 [phone]

## 2015-06-07 ENCOUNTER — Ambulatory Visit (INDEPENDENT_AMBULATORY_CARE_PROVIDER_SITE_OTHER): Payer: Medicare Other | Admitting: Internal Medicine

## 2015-06-07 ENCOUNTER — Encounter (INDEPENDENT_AMBULATORY_CARE_PROVIDER_SITE_OTHER): Payer: Self-pay | Admitting: Internal Medicine

## 2015-06-07 VITALS — BP 120/78 | HR 72 | Temp 97.7°F | Ht 70.0 in | Wt 185.0 lb

## 2015-06-07 DIAGNOSIS — R1011 Right upper quadrant pain: Secondary | ICD-10-CM

## 2015-06-07 DIAGNOSIS — R109 Unspecified abdominal pain: Secondary | ICD-10-CM | POA: Insufficient documentation

## 2015-06-07 NOTE — Progress Notes (Signed)
Subjective:    Patient ID: Joel Greene, male    DOB: Nov 24, 1950, 64 y.o.   MRN: [id number]  HPI He was last seen in August with abdominal pain. He was alternating between diarrhea and constipation. He told me if he took Imodium he would become constipated. He requested Dicyclmine at that [place] visit. He did not call with a PR report.  He tells me as soon as he eats, his rt upper quadrant will throb. It hurts to touch his rt upper quadrant. He thinks it may be his GB. Greasy foods bother him. He says he has been eating chicken and potato soup. He denies any acid reflux. He tells me he is hungry, but he is scared to eat. He has lost 17 pounds since his visit in August.  He is alternating between constipation and diarrhea. He denies any rectal bleeding.  Today he states he went to the ED yesterday. He underwent an [place] which revealed IMPRESSION: 1. No gallstones or findings for acute cholecystitis. Mild gallbladder contraction. 2. Normal caliber common bile duct. 3. Mild diffuse fatty infiltration of the liver. CBC 56mm.  06/06/2015 DG abdomen: Rt upper quadrant pain, diarrhea: IMPRESSION: No obstruction or free air. Moderate stool throughout colon. Small calculus right kidney. No lung edema or consolidation.   06/06/2015 Lipase 41 Hepatic Function Panel     Component Value Date/Time   PROT 7.0 06/06/2015 1704   ALBUMIN 4.5 06/06/2015 1704   AST 19 06/06/2015 1704   ALT 32 06/06/2015 1704   ALKPHOS 63 06/06/2015 1704   BILITOT 0.9 06/06/2015 1704   CBC    Component Value Date/Time   WBC 4.3 06/06/2015 1704   RBC 4.94 06/06/2015 1704   HGB 16.5 06/06/2015 1704   HCT 44.9 06/06/2015 1704   PLT 199 06/06/2015 1704   MCV 90.9 06/06/2015 1704   MCH 33.4 06/06/2015 1704   MCHC 36.7* 06/06/2015 1704   RDW 11.9 06/06/2015 1704   LYMPHSABS 0.7 06/06/2015 1704   MONOABS 0.4 06/06/2015 1704   EOSABS 0.0 06/06/2015 1704   BASOSABS 0.0 06/06/2015 1704     03/22/2015 CT abdomen/pelvis  with CM:  Rt flank pain, hematuria. IMPRESSION: Bilateral nonobstructive nephrolithiasis. No evidence of ureteral calculi or hydronephrosis.  No radiographic evidence of urinary tract neoplasm.    09/01/2012: Colonoscopy  Indications: Patient is 64 year old Caucasian male with history of colonic adenomas who is undergoing surveillance colonoscopy. Impression:  Examination performed to cecum. Two small polyps removed from transverse colon and submitted together(one cold snared and the other one removed via cold biopsy). two small diverticula at sigmoid colon.  Small polyps are tubular adenomas Next colonoscopy in 5 years   Review of Systems Past Medical History  Diagnosis Date  . IBS (irritable bowel syndrome)   . Glaucoma   . Leukopenia 10/23/2011    Decrease in lymphocytes dating back to 1995.  Marland Kitchen History of tobacco abuse 10/23/2011    History of smoking for 10 years, a pack per day, quitting at age 29.  . Bronchitis   . Renal calculi     Past Surgical History  Procedure Laterality Date  . Cystography    . Appendectomy    . Rotator cuff repair      bilateral shoulders  . Refractive surgery      "to relieve glaucoma"  . Colonoscopy N/A 09/01/2012    Procedure: COLONOSCOPY;  Surgeon: [name], MD;  Location: [facility];  Service: Endoscopy;  Laterality: N/A;  1030    Allergies  Allergen Reactions  . Clams [Shellfish Allergy] Nausea And Vomiting  . Tetracyclines & Related Rash  . Tylenol [Acetaminophen] Rash    Current Outpatient Prescriptions on File Prior to Visit  Medication Sig Dispense Refill  . ALPRAZolam (XANAX) 1 MG tablet Take 1 mg by mouth at bedtime as needed for anxiety.     Marland Kitchen aspirin 81 MG tablet Take 81 mg by mouth daily.    . Aspirin-Acetaminophen-Caffeine (GOODY HEADACHE PO) Take 1 Package by mouth as needed (headache).     . dicyclomine (BENTYL) 20 MG tablet Take 1 tablet (20 mg total) by mouth every 6 (six) hours as needed for spasms  (abdominal cramping). 15 tablet 0  . fluticasone (FLONASE) 50 MCG/ACT nasal spray INHALE 2 SPRAYS IN EACH NOSTRIL ONCE DAILY. 16 g 3  . Multiple Vitamin (MULITIVITAMIN WITH MINERALS) TABS Take 1 tablet by mouth daily.    . tamsulosin (FLOMAX) 0.4 MG CAPS capsule Take 1 capsule by mouth daily.    Marland Kitchen zolpidem (AMBIEN) 10 MG tablet Take 10 mg by mouth at bedtime as needed for sleep.      No current facility-administered medications on file prior to visit.        Objective:   Physical Exam Blood pressure 120/78, pulse 72, temperature 97.7 F (36.5 C), height 5\' 10"  (1.778 m), weight 185 lb (83.915 kg). Alert and oriented. Skin warm and dry. Oral mucosa is moist.   . Sclera anicteric, conjunctivae is pink. Thyroid not enlarged. No cervical lymphadenopathy. Lungs clear. Heart regular rate and rhythm.  Abdomen is soft. Bowel sounds are positive. No hepatomegaly. No abdominal masses felt.Rt upper quadrant tenderness.  No edema to lower extremities.         Assessment & Plan:  RT upper quadrant pain. HIDA scan.  I am also going to cover him with Dexilant samples just in case this is PUD. If HIDA scan is normal, may need an EGD. Further recommendation to follow.

## 2015-06-07 NOTE — Patient Instructions (Signed)
HIDA scan. Dexilant samples x 3 boxes given to patient.

## 2015-06-12 ENCOUNTER — Encounter (HOSPITAL_COMMUNITY): Payer: Self-pay

## 2015-06-12 ENCOUNTER — Encounter (HOSPITAL_COMMUNITY)
Admission: RE | Admit: 2015-06-12 | Discharge: 2015-06-12 | Disposition: A | Discharge status: Home / Self Care | Payer: Medicare Other | Source: Ambulatory Visit | Attending: Internal Medicine | Admitting: Internal Medicine

## 2015-06-12 ENCOUNTER — Other Ambulatory Visit (INDEPENDENT_AMBULATORY_CARE_PROVIDER_SITE_OTHER): Payer: Self-pay | Admitting: Internal Medicine

## 2015-06-12 DIAGNOSIS — R1011 Right upper quadrant pain: Secondary | ICD-10-CM | POA: Insufficient documentation

## 2015-06-12 DIAGNOSIS — R1013 Epigastric pain: Secondary | ICD-10-CM

## 2015-06-12 DIAGNOSIS — G8929 Other chronic pain: Secondary | ICD-10-CM

## 2015-06-12 MED ORDER — SINCALIDE 5 MCG IJ SOLR
INTRAMUSCULAR | Status: AC
  Administered 2015-06-12: 1.68 ug
  Filled 2015-06-12: qty 5

## 2015-06-12 MED ORDER — STERILE WATER FOR INJECTION IJ SOLN
INTRAMUSCULAR | Status: AC
  Administered 2015-06-12: 5 mL
  Filled 2015-06-12: qty 10

## 2015-06-12 MED ORDER — TECHNETIUM TC 99M MEBROFENIN IV KIT
5.0000 | PACK | Freq: Once | INTRAVENOUS | Status: AC | PRN
  Administered 2015-06-12: 5.5 via INTRAVENOUS

## 2015-06-12 MED ORDER — SODIUM CHLORIDE 0.9 % IJ SOLN
INTRAMUSCULAR | Status: AC
  Filled 2015-06-12: qty 48

## 2015-06-13 ENCOUNTER — Encounter (INDEPENDENT_AMBULATORY_CARE_PROVIDER_SITE_OTHER): Payer: Self-pay | Admitting: *Deleted

## 2015-07-01 DIAGNOSIS — K279 Peptic ulcer, site unspecified, unspecified as acute or chronic, without hemorrhage or perforation: Secondary | ICD-10-CM

## 2015-07-01 HISTORY — DX: Peptic ulcer, site unspecified, unspecified as acute or chronic, without hemorrhage or perforation: K27.9

## 2015-07-12 ENCOUNTER — Encounter (HOSPITAL_COMMUNITY)
Admission: RE | Disposition: A | Discharge status: Home / Self Care | Payer: Self-pay | Source: Ambulatory Visit | Attending: Internal Medicine

## 2015-07-12 ENCOUNTER — Ambulatory Visit (HOSPITAL_COMMUNITY)
Admission: RE | Admit: 2015-07-12 | Discharge: 2015-07-12 | Disposition: A | Discharge status: Home / Self Care | Payer: Medicare Other | Source: Ambulatory Visit | Attending: Internal Medicine | Admitting: Internal Medicine

## 2015-07-12 ENCOUNTER — Encounter (HOSPITAL_COMMUNITY): Payer: Self-pay | Admitting: *Deleted

## 2015-07-12 DIAGNOSIS — K3189 Other diseases of stomach and duodenum: Secondary | ICD-10-CM | POA: Diagnosis not present

## 2015-07-12 DIAGNOSIS — Z79899 Other long term (current) drug therapy: Secondary | ICD-10-CM | POA: Diagnosis not present

## 2015-07-12 DIAGNOSIS — K5900 Constipation, unspecified: Secondary | ICD-10-CM | POA: Insufficient documentation

## 2015-07-12 DIAGNOSIS — K269 Duodenal ulcer, unspecified as acute or chronic, without hemorrhage or perforation: Secondary | ICD-10-CM | POA: Diagnosis not present

## 2015-07-12 DIAGNOSIS — K319 Disease of stomach and duodenum, unspecified: Secondary | ICD-10-CM | POA: Diagnosis not present

## 2015-07-12 DIAGNOSIS — K259 Gastric ulcer, unspecified as acute or chronic, without hemorrhage or perforation: Secondary | ICD-10-CM | POA: Diagnosis not present

## 2015-07-12 DIAGNOSIS — R197 Diarrhea, unspecified: Secondary | ICD-10-CM | POA: Insufficient documentation

## 2015-07-12 DIAGNOSIS — R1013 Epigastric pain: Secondary | ICD-10-CM | POA: Diagnosis not present

## 2015-07-12 DIAGNOSIS — Z7982 Long term (current) use of aspirin: Secondary | ICD-10-CM | POA: Insufficient documentation

## 2015-07-12 DIAGNOSIS — F419 Anxiety disorder, unspecified: Secondary | ICD-10-CM | POA: Diagnosis not present

## 2015-07-12 DIAGNOSIS — G8929 Other chronic pain: Secondary | ICD-10-CM

## 2015-07-12 DIAGNOSIS — Z87891 Personal history of nicotine dependence: Secondary | ICD-10-CM | POA: Diagnosis not present

## 2015-07-12 DIAGNOSIS — R110 Nausea: Secondary | ICD-10-CM | POA: Diagnosis not present

## 2015-07-12 DIAGNOSIS — Z7951 Long term (current) use of inhaled steroids: Secondary | ICD-10-CM | POA: Diagnosis not present

## 2015-07-12 DIAGNOSIS — K219 Gastro-esophageal reflux disease without esophagitis: Secondary | ICD-10-CM | POA: Diagnosis not present

## 2015-07-12 DIAGNOSIS — K2960 Other gastritis without bleeding: Secondary | ICD-10-CM | POA: Diagnosis not present

## 2015-07-12 DIAGNOSIS — R1011 Right upper quadrant pain: Secondary | ICD-10-CM | POA: Insufficient documentation

## 2015-07-12 DIAGNOSIS — K2990 Gastroduodenitis, unspecified, without bleeding: Secondary | ICD-10-CM | POA: Diagnosis not present

## 2015-07-12 DIAGNOSIS — K208 Other esophagitis: Secondary | ICD-10-CM | POA: Diagnosis not present

## 2015-07-12 DIAGNOSIS — K449 Diaphragmatic hernia without obstruction or gangrene: Secondary | ICD-10-CM | POA: Diagnosis not present

## 2015-07-12 DIAGNOSIS — R634 Abnormal weight loss: Secondary | ICD-10-CM

## 2015-07-12 HISTORY — PX: ESOPHAGOGASTRODUODENOSCOPY: SHX5428

## 2015-07-12 SURGERY — EGD (ESOPHAGOGASTRODUODENOSCOPY)
Anesthesia: Moderate Sedation

## 2015-07-12 MED ORDER — MEPERIDINE HCL 50 MG/ML IJ SOLN
INTRAMUSCULAR | Status: AC
  Filled 2015-07-12: qty 1

## 2015-07-12 MED ORDER — BUTAMBEN-TETRACAINE-BENZOCAINE 2-2-14 % EX AERO
INHALATION_SPRAY | CUTANEOUS | Status: AC
  Filled 2015-07-12: qty 20

## 2015-07-12 MED ORDER — MEPERIDINE HCL 50 MG/ML IJ SOLN
INTRAMUSCULAR | Status: DC | PRN
  Administered 2015-07-12 (×4): 25 mg via INTRAVENOUS

## 2015-07-12 MED ORDER — MIDAZOLAM HCL 5 MG/5ML IJ SOLN
INTRAMUSCULAR | Status: DC | PRN
  Administered 2015-07-12: 3 mg via INTRAVENOUS
  Administered 2015-07-12: 2 mg via INTRAVENOUS
  Administered 2015-07-12: 3 mg via INTRAVENOUS
  Administered 2015-07-12: 2 mg via INTRAVENOUS

## 2015-07-12 MED ORDER — BUTAMBEN-TETRACAINE-BENZOCAINE 2-2-14 % EX AERO
INHALATION_SPRAY | CUTANEOUS | Status: DC | PRN
  Administered 2015-07-12: 2 via TOPICAL

## 2015-07-12 MED ORDER — MIDAZOLAM HCL 5 MG/5ML IJ SOLN
INTRAMUSCULAR | Status: AC
  Filled 2015-07-12: qty 10

## 2015-07-12 MED ORDER — SODIUM CHLORIDE 0.9 % IV SOLN
INTRAVENOUS | Status: DC
  Administered 2015-07-12: 12:00:00 via INTRAVENOUS

## 2015-07-12 MED ORDER — PANTOPRAZOLE SODIUM 40 MG PO TBEC: 40.0000 mg | DELAYED_RELEASE_TABLET | Freq: Two times a day (BID) | ORAL | Status: DC

## 2015-07-12 MED ORDER — MEPERIDINE HCL 50 MG/ML IJ SOLN
INTRAMUSCULAR | Status: DC
Start: 2015-07-12 — End: 2015-07-12
  Filled 2015-07-12: qty 1

## 2015-07-12 MED ORDER — STERILE WATER FOR IRRIGATION IR SOLN
Status: DC | PRN
  Administered 2015-07-12: 13:00:00

## 2015-07-12 MED ORDER — PANTOPRAZOLE SODIUM 40 MG PO TBEC: 40.0000 mg | DELAYED_RELEASE_TABLET | Freq: Every day | ORAL | Status: DC

## 2015-07-12 NOTE — Discharge Instructions (Signed)
Resume usual medications and diet. Anti-reflux measures. Pantoprazole 40 mg by mouth 30 minutes before breakfast and evening meal daily. No driving for 24 hours. Patient will call with biopsy results. Gastrointestinal Endoscopy, Care After Refer to this sheet in the next few weeks. These instructions provide you with information on caring for yourself after your procedure. Your caregiver may also give you more specific instructions. Your treatment has been planned according to current medical practices, but problems sometimes occur. Call your caregiver if you have any problems or questions after your procedure. HOME CARE INSTRUCTIONS  If you were given medicine to help you relax (sedative), do not drive, operate machinery, or sign important documents for 24 hours.  Avoid alcohol and hot or warm beverages for the first 24 hours after the procedure.  Only take over-the-counter or prescription medicines for pain, discomfort, or fever as directed by your caregiver. You may resume taking your normal medicines unless your caregiver tells you otherwise. Ask your caregiver when you may resume taking medicines that may cause bleeding, such as aspirin, clopidogrel, or warfarin.  You may return to your normal diet and activities on the day after your procedure, or as directed by your caregiver. Walking may help to reduce any bloated feeling in your abdomen.  Drink enough fluids to keep your urine clear or pale yellow.  You may gargle with salt water if you have a sore throat. SEEK IMMEDIATE MEDICAL CARE IF:  You have severe nausea or vomiting.  You have severe abdominal pain, abdominal cramps that last longer than 6 hours, or abdominal swelling (distention).  You have severe shoulder or back pain.  You have trouble swallowing.  You have shortness of breath, your breathing is shallow, or you are breathing faster than normal.  You have a fever or a rapid heartbeat.  You vomit blood or material  that looks like coffee grounds.  You have bloody, black, or tarry stools. MAKE SURE YOU:  Understand these instructions.  Will watch your condition.  Will get help right away if you are not doing well or get worse.   This information is not intended to replace advice given to you by your health care provider. Make sure you discuss any questions you have with your health care provider.   Document Released: 01/29/2004 Document Revised: 07/07/2014 Document Reviewed: 09/16/2011 Elsevier Interactive Patient Education 2016 [employer].   Gastroesophageal Reflux Disease, Adult Normally, food travels down the esophagus and stays in the stomach to be digested. However, when a person has gastroesophageal reflux disease (GERD), food and stomach acid move back up into the esophagus. When this happens, the esophagus becomes sore and inflamed. Over time, GERD can create small holes (ulcers) in the lining of the esophagus.  CAUSES This condition is caused by a problem with the muscle between the esophagus and the stomach (lower esophageal sphincter, or LES). Normally, the LES muscle closes after food passes through the esophagus to the stomach. When the LES is weakened or abnormal, it does not close properly, and that allows food and stomach acid to go back up into the esophagus. The LES can be weakened by certain dietary substances, medicines, and medical conditions, including:  Tobacco use.  Pregnancy.  Having a hiatal hernia.  Heavy alcohol use.  Certain foods and beverages, such as coffee, chocolate, onions, and peppermint. RISK FACTORS This condition is more likely to develop in:  People who have an increased body weight.  People who have connective tissue disorders.  People who  use NSAID medicines. SYMPTOMS Symptoms of this condition include:  Heartburn.  Difficult or painful swallowing.  The feeling of having a lump in the throat.  Abitter taste in the mouth.  Bad  breath.  Having a large amount of saliva.  Having an upset or bloated stomach.  Belching.  Chest pain.  Shortness of breath or wheezing.  Ongoing (chronic) cough or a night-time cough.  Wearing away of tooth enamel.  Weight loss. Different conditions can cause chest pain. Make sure to see your health care provider if you experience chest pain. DIAGNOSIS Your health care provider will take a medical history and perform a physical exam. To determine if you have mild or severe GERD, your health care provider may also monitor how you respond to treatment. You may also have other tests, including:  An endoscopy toexamine your stomach and esophagus with a small camera.  A test thatmeasures the acidity level in your esophagus.  A test thatmeasures how much pressure is on your esophagus.  A barium swallow or modified barium swallow to show the shape, size, and functioning of your esophagus. TREATMENT The goal of treatment is to help relieve your symptoms and to prevent complications. Treatment for this condition may vary depending on how severe your symptoms are. Your health care provider may recommend:  Changes to your diet.  Medicine.  Surgery. HOME CARE INSTRUCTIONS Diet  Follow a diet as recommended by your health care provider. This may involve avoiding foods and drinks such as:  Coffee and tea (with or without caffeine).  Drinks that containalcohol.  Energy drinks and sports drinks.  Carbonated drinks or sodas.  Chocolate and cocoa.  Peppermint and mint flavorings.  Garlic and onions.  Horseradish.  Spicy and acidic foods, including peppers, chili powder, curry powder, vinegar, hot sauces, and barbecue sauce.  Citrus fruit juices and citrus fruits, such as oranges, lemons, and limes.  Tomato-based foods, such as red sauce, chili, salsa, and pizza with red sauce.  Fried and fatty foods, such as donuts, french fries, potato chips, and high-fat  dressings.  High-fat meats, such as hot dogs and fatty cuts of red and white meats, such as rib eye steak, sausage, ham, and bacon.  High-fat dairy items, such as whole milk, butter, and cream cheese.  Eat small, frequent meals instead of large meals.  Avoid drinking large amounts of liquid with your meals.  Avoid eating meals during the 2-3 hours before bedtime.  Avoid lying down right after you eat.  Do not exercise right after you eat. General Instructions  Pay attention to any changes in your symptoms.  Take over-the-counter and prescription medicines only as told by your health care provider. Do not take aspirin, ibuprofen, or other NSAIDs unless your health care provider told you to do so.  Do not use any tobacco products, including cigarettes, chewing tobacco, and e-cigarettes. If you need help quitting, ask your health care provider.  Wear loose-fitting clothing. Do not wear anything tight around your waist that causes pressure on your abdomen.  Raise (elevate) the head of your bed 6 inches (15cm).  Try to reduce your stress, such as with yoga or meditation. If you need help reducing stress, ask your health care provider.  If you are overweight, reduce your weight to an amount that is healthy for you. Ask your health care provider for guidance about a safe weight loss goal.  Keep all follow-up visits as told by your health care provider. This is important. SEEK  MEDICAL CARE IF:  You have new symptoms.  You have unexplained weight loss.  You have difficulty swallowing, or it hurts to swallow.  You have wheezing or a persistent cough.  Your symptoms do not improve with treatment.  You have a hoarse voice. SEEK IMMEDIATE MEDICAL CARE IF:  You have pain in your arms, neck, jaw, teeth, or back.  You feel sweaty, dizzy, or light-headed.  You have chest pain or shortness of breath.  You vomit and your vomit looks like blood or coffee grounds.  You  faint.  Your stool is bloody or black.  You cannot swallow, drink, or eat.   This information is not intended to replace advice given to you by your health care provider. Make sure you discuss any questions you have with your health care provider.   Document Released: 03/26/2005 Document Revised: 03/07/2015 Document Reviewed: 10/11/2014 Elsevier Interactive Patient Education [employer].

## 2015-07-12 NOTE — Op Note (Addendum)
EGD PROCEDURE REPORT  PATIENT:  Joel Greene  MR#:  [id number] Birthdate:  1951-02-02, 65 y.o., male Endoscopist:  Dr. [name], MD Referred By:  Dr. Glo Herring, MD  Procedure Date: 07/12/2015  Procedure:   EGD  Indications:  Patient is 65 year old Caucasian male who presents with 5 month history of epigastric and right upper quadrant pain described as throbbing pain after each meal associated with nausea and he has lost 30 pounds disease afraid to eat. He also has intermittent diarrhea and constipation and the symptoms have improved with dicyclomine. He does not take OTC NSAIDs. CBC and comprehensive chemistry panel have been normal. Ultrasound is negative for cholelithiasis and HIDA scan revealed EF of 92%. Abdominal pelvic CTs been unremarkable. He is undergoing diagnostic EGD.           Informed Consent:  The risks, benefits, alternatives & imponderables which include, but are not limited to, bleeding, infection, perforation, drug reaction and potential missed lesion have been reviewed.  The potential for biopsy, lesion removal, esophageal dilation, etc. have also been discussed.  Questions have been answered.  All parties agreeable.  Please see history & physical in medical record for more information.  Medications:  Demerol 100 mg IV Versed 10 mg IV Cetacaine spray topically for oropharyngeal anesthesia  First dose administered at 1:13 PM Last dose administered at 1: 22 PM, Scope Out 1:35 PM  Description of procedure:  The endoscope was introduced through the mouth and advanced to the second portion of the duodenum without difficulty or limitations. The mucosal surfaces were surveyed very carefully during advancement of the scope and upon withdrawal.  Findings:  Esophagus:  Mucosa of the esophagus was normal. Erythema and edema noted to mucosa at GE junction along with single erosion. GE junction is wavy. GEJ:  38 cm Hiatus:  40 cm Stomach:  Stomach was empty and  distended very well with insufflation. Folds in the proximal stomach were normal. Examination of mucosa at gastric body was normal. Few small prepyloric erosions noted. Pyloric channel was patent. Angularis fundus and cardia were examined by retroflex of the scope and were unremarkable. Duodenum:  Patchy erythema and edema noted to bulbar mucosa along with two erosions.  Therapeutic/Diagnostic Maneuvers Performed:  Multiple biopsies taken from GE junction along with biopsy from antral mucosa for routine histology.  Complications:  None  EBL: Minimal  Impression: Single erosion noted at GE junction along with erythema to mucosa and wavy GE junction. Biopsies taken from GE junction. Erosive gastroduodenitis. Antral biopsy taken for routine histology.  Recommendations:  Standard instructions given. Anti-reflux measures. Pantoprazole 40 mg by mouth twice a day. I will be contacting patient with biopsy results and further recommendations.  [name] U  07/12/2015  1:42 PM  CC: Dr. [name], MD & Dr. Rayne Du ref. provider found

## 2015-07-12 NOTE — H&P (Signed)
Joel Greene is an 65 y.o. male.   Chief Complaint: Patient is here for EGD HPI:  Patient is 52 old Caucasian male who presents with 5 month history of epigastric and right upper quadrant pain which gets worse after meals and described as throbbing associated with nausea. He has been emergency room on 2 occasions. He's undergone multiple studies including ultrasound HIDA scan as well as abdominal pelvic CT and routine blood work. He also gives history of diarrhea alternating constipation but denies fever chills melena or rectal bleeding. He says he has lost 30 pounds since his symptoms began. He has good appetite up but afraid to eat. He does not take OTC NSAIDs. He feels some better with dicyclomine. He does not smoke cigarettes or drink alcohol.  Past Medical History  Diagnosis Date  . IBS (irritable bowel syndrome)   . Glaucoma   . Leukopenia 10/23/2011    Decrease in lymphocytes dating back to 1995.  Marland Kitchen History of tobacco abuse 10/23/2011    History of smoking for 10 years, a pack per day, quitting at age 19.  . Bronchitis   . Renal calculi     Past Surgical History  Procedure Laterality Date  . Cystography    . Appendectomy    . Rotator cuff repair      bilateral shoulders  . Refractive surgery      "to relieve glaucoma"  . Colonoscopy N/A 09/01/2012    Procedure: COLONOSCOPY;  Surgeon: Joel Houston, MD;  Location: [facility];  Service: Endoscopy;  Laterality: N/A;  1030    Family History  Problem Relation Age of Onset  . Breast cancer Mother   . Kidney Stones Brother    Social History:  reports that he quit smoking about 32 years ago. His smoking use included Cigarettes. He has a 30 pack-year smoking history. He has never used smokeless tobacco. He reports that he drinks alcohol. He reports that he does not use illicit drugs.  Allergies:  Allergies  Allergen Reactions  . Clams [Shellfish Allergy] Nausea And Vomiting  . Tetracyclines & Related Rash  . Tylenol  [Acetaminophen] Rash    Medications Prior to Admission  Medication Sig Dispense Refill  . ALPRAZolam (XANAX) 1 MG tablet Take 1 mg by mouth at bedtime as needed for anxiety.     Marland Kitchen aspirin 81 MG tablet Take 81 mg by mouth daily.    Marland Kitchen dicyclomine (BENTYL) 20 MG tablet Take 1 tablet (20 mg total) by mouth every 6 (six) hours as needed for spasms (abdominal cramping). 15 tablet 0  . escitalopram (LEXAPRO) 10 MG tablet Take 10 mg by mouth daily.     . fluticasone (FLONASE) 50 MCG/ACT nasal spray INHALE 2 SPRAYS IN EACH NOSTRIL ONCE DAILY. 16 g 3  . Multiple Vitamin (MULITIVITAMIN WITH MINERALS) TABS Take 1 tablet by mouth daily.    . tamsulosin (FLOMAX) 0.4 MG CAPS capsule Take 1 capsule by mouth daily.    . Vitamin D, Ergocalciferol, (DRISDOL) 50000 UNITS CAPS capsule Take 50,000 Units by mouth every 7 (seven) days.    Marland Kitchen zolpidem (AMBIEN) 10 MG tablet Take 10 mg by mouth at bedtime as needed for sleep.       No results found for this or any previous visit (from the past 48 hour(s)). No results found.  ROS  Blood pressure 131/70, pulse 60, temperature 98.1 F (36.7 C), temperature source Oral, resp. rate 18, SpO2 97 %. Physical Exam  Constitutional: He appears well-developed  and well-nourished.  HENT:  Mouth/Throat: Oropharynx is clear and moist.  Eyes: Conjunctivae are normal. No scleral icterus.  Neck: No thyromegaly present.  Cardiovascular: Normal rate, regular rhythm and normal heart sounds.   No murmur heard. Respiratory: Effort normal and breath sounds normal.  GI: Soft. Tenderness:  mild to moderate tenderness at epigastrium and right subcostal region.  Musculoskeletal: He exhibits no edema.  Lymphadenopathy:    He has no cervical adenopathy.  Neurological: He is alert.  Skin: Skin is warm and dry.     Assessment/Plan Epigastric pain nausea and weight loss. Negative workup for gallbladder disease. Negative abdominopelvic CT. Diagnostic EGD.  Joel Greene  U 07/12/2015, 1:05 PM

## 2015-07-16 ENCOUNTER — Encounter (HOSPITAL_COMMUNITY): Payer: Self-pay | Admitting: Internal Medicine

## 2015-07-16 DIAGNOSIS — H401121 Primary open-angle glaucoma, left eye, mild stage: Secondary | ICD-10-CM | POA: Diagnosis not present

## 2015-07-16 DIAGNOSIS — H401112 Primary open-angle glaucoma, right eye, moderate stage: Secondary | ICD-10-CM | POA: Diagnosis not present

## 2015-07-27 ENCOUNTER — Other Ambulatory Visit (INDEPENDENT_AMBULATORY_CARE_PROVIDER_SITE_OTHER): Payer: Self-pay | Admitting: Internal Medicine

## 2015-07-31 ENCOUNTER — Encounter (INDEPENDENT_AMBULATORY_CARE_PROVIDER_SITE_OTHER): Payer: Self-pay | Admitting: Internal Medicine

## 2015-07-31 ENCOUNTER — Ambulatory Visit (INDEPENDENT_AMBULATORY_CARE_PROVIDER_SITE_OTHER): Payer: Medicare Other | Admitting: Internal Medicine

## 2015-07-31 VITALS — BP 126/70 | HR 64 | Temp 97.6°F | Ht 70.0 in | Wt 180.7 lb

## 2015-07-31 DIAGNOSIS — R1031 Right lower quadrant pain: Secondary | ICD-10-CM

## 2015-07-31 DIAGNOSIS — K219 Gastro-esophageal reflux disease without esophagitis: Secondary | ICD-10-CM | POA: Diagnosis not present

## 2015-07-31 NOTE — Progress Notes (Addendum)
Subjective:    Patient ID: Joel Greene, male    DOB: 1950/10/17, 65 y.o.   MRN: [id number]  HPI  Here today for f/u. He tells me he has epigastric pain. He says as soon as he eats he has epigastric pain radiating into his rt abdomen up into his shoulders. Symptoms off and on for 6 months. Worse in the last few weeks.  H has nausea x 3 days.  He rates the pain at a 10.   He says he has had some diarrhea. He has lost 4 pounds since his visit in December. His work up so far has been negative. Appetite is not good.       07/12/2015 EGD   Indications: Patient is 65 year old Caucasian male who presents with 5 month history of epigastric and right upper quadrant pain described as throbbing pain after each meal associated with nausea and he has lost 30 pounds disease afraid to eat. He also has intermittent diarrhea and constipation and the symptoms have improved with dicyclomine. He does not take OTC NSAIDs. CBC and comprehensive chemistry panel have been normal. Ultrasound is negative for cholelithiasis and HIDA scan revealed EF of 92%. Abdominal pelvic CTs been unremarkable. He is undergoing diagnostic EGD. Impression: Single erosion noted at GE junction along with erythema to mucosa and wavy GE junction. Biopsies taken from GE junction. Erosive gastroduodenitis. Antral biopsy taken for routine histology.     06/12/2015 HIDA scan: Rt upper quadrant pain:  IMPRESSION: Study within normal limits.  06/06/2015 US abdomen: abdominal pain, nausea. IMPRESSION: 1. No gallstones or findings for acute cholecystitis. Mild gallbladder contraction. 2. Normal caliber common bile duct. 3. Mild diffuse fatty infiltration of the liver.  09/01/2012: Colonoscopy  Indications: Patient is 65 year old Caucasian male with history of colonic adenomas who is undergoing surveillance colonoscopy. Impression:   Examination performed to cecum. Two small polyps removed from transverse colon and submitted together(one cold snared and the other one removed via cold biopsy). two small diverticula at sigmoid colon.  Small polyps are tubular adenomas Next colonoscopy in 5 years     CBC    Component Value Date/Time   WBC 4.3 06/06/2015 1704   RBC 4.94 06/06/2015 1704   HGB 16.5 06/06/2015 1704   HCT 44.9 06/06/2015 1704   PLT 199 06/06/2015 1704   MCV 90.9 06/06/2015 1704   MCH 33.4 06/06/2015 1704   MCHC 36.7* 06/06/2015 1704   RDW 11.9 06/06/2015 1704   LYMPHSABS 0.7 06/06/2015 1704   MONOABS 0.4 06/06/2015 1704   EOSABS 0.0 06/06/2015 1704   BASOSABS 0.0 06/06/2015 1704   Hepatic Function Panel     Component Value Date/Time   PROT 7.0 06/06/2015 1704   ALBUMIN 4.5 06/06/2015 1704   AST 19 06/06/2015 1704   ALT 32 06/06/2015 1704   ALKPHOS 63 06/06/2015 1704   BILITOT 0.9 06/06/2015 1704     Review of Systems Past Medical History  Diagnosis Date  . IBS (irritable bowel syndrome)   . Glaucoma   . Leukopenia 10/23/2011    Decrease in lymphocytes dating back to 1995.  Marland Kitchen History of tobacco abuse 10/23/2011    History of smoking for 10 years, a pack per day, quitting at age 68.  . Bronchitis   . Renal calculi     Past Surgical History  Procedure Laterality Date  . Cystography    . Appendectomy    . Rotator cuff repair      bilateral shoulders  . Refractive  surgery      "to relieve glaucoma"  . Colonoscopy N/A 09/01/2012    Procedure: COLONOSCOPY;  Surgeon: [name], MD;  Location: [facility];  Service: Endoscopy;  Laterality: N/A;  1030  . Esophagogastroduodenoscopy N/A 07/12/2015    Procedure: ESOPHAGOGASTRODUODENOSCOPY (EGD);  Surgeon: [name], MD;  Location: [facility];  Service: Endoscopy;  Laterality: N/A;  2:50 - moved to 1:00 - Ann to notify    Allergies  Allergen Reactions  . Clams [Shellfish Allergy] Nausea And Vomiting  . Tetracyclines  & Related Rash  . Tylenol [Acetaminophen] Rash    Current Outpatient Prescriptions on File Prior to Visit  Medication Sig Dispense Refill  . ALPRAZolam (XANAX) 1 MG tablet Take 1 mg by mouth at bedtime as needed for anxiety.     Marland Kitchen aspirin 81 MG tablet Take 81 mg by mouth daily.    Marland Kitchen dicyclomine (BENTYL) 10 MG capsule TAKE 1 CAPSULE 3 TIMES DAILY. 90 capsule 0  . dicyclomine (BENTYL) 20 MG tablet Take 1 tablet (20 mg total) by mouth every 6 (six) hours as needed for spasms (abdominal cramping). 15 tablet 0  . escitalopram (LEXAPRO) 10 MG tablet Take 10 mg by mouth daily.     . fluticasone (FLONASE) 50 MCG/ACT nasal spray INHALE 2 SPRAYS IN EACH NOSTRIL ONCE DAILY. 16 g 3  . Multiple Vitamin (MULITIVITAMIN WITH MINERALS) TABS Take 1 tablet by mouth daily.    . pantoprazole (PROTONIX) 40 MG tablet Take 1 tablet (40 mg total) by mouth 2 (two) times daily before a meal. 60 tablet 2  . tamsulosin (FLOMAX) 0.4 MG CAPS capsule Take 1 capsule by mouth daily.    . Vitamin D, Ergocalciferol, (DRISDOL) 50000 UNITS CAPS capsule Take 50,000 Units by mouth every 7 (seven) days.    Marland Kitchen zolpidem (AMBIEN) 10 MG tablet Take 10 mg by mouth at bedtime as needed for sleep.      No current facility-administered medications on file prior to visit.        Objective:   Physical Exam Blood pressure 126/70, pulse 64, temperature 97.6 F (36.4 C), height 5\' 10"  (1.778 m), weight 180 lb 11.2 oz (81.965 kg). Alert and oriented. Skin warm and dry. Oral mucosa is moist.   . Sclera anicteric, conjunctivae is pink. Thyroid not enlarged. No cervical lymphadenopathy. Lungs clear. Heart regular rate and rhythm.  Abdomen is soft. Bowel sounds are positive. No hepatomegaly. No abdominal masses felt. Tenderness to epigastric region and rt upper quadrant.  No edema to lower extremities         Assessment & Plan:  Refractory GERD.  Recent EGD revealed a single erosion.  Rt upper quadrant pain with negative work up. I discussed  with Dr. Laural Golden.  Referral to [facility]: GI.

## 2015-07-31 NOTE — Patient Instructions (Signed)
Referral to [facility]. If pain worsens, go to the ED.

## 2015-08-27 DIAGNOSIS — M5414 Radiculopathy, thoracic region: Secondary | ICD-10-CM | POA: Diagnosis not present

## 2015-08-27 DIAGNOSIS — M9902 Segmental and somatic dysfunction of thoracic region: Secondary | ICD-10-CM | POA: Diagnosis not present

## 2015-08-29 DIAGNOSIS — M9902 Segmental and somatic dysfunction of thoracic region: Secondary | ICD-10-CM | POA: Diagnosis not present

## 2015-08-29 DIAGNOSIS — M5414 Radiculopathy, thoracic region: Secondary | ICD-10-CM | POA: Diagnosis not present

## 2015-08-31 DIAGNOSIS — M5414 Radiculopathy, thoracic region: Secondary | ICD-10-CM | POA: Diagnosis not present

## 2015-08-31 DIAGNOSIS — M9902 Segmental and somatic dysfunction of thoracic region: Secondary | ICD-10-CM | POA: Diagnosis not present

## 2015-09-03 DIAGNOSIS — M5414 Radiculopathy, thoracic region: Secondary | ICD-10-CM | POA: Diagnosis not present

## 2015-09-03 DIAGNOSIS — M9902 Segmental and somatic dysfunction of thoracic region: Secondary | ICD-10-CM | POA: Diagnosis not present

## 2015-09-05 DIAGNOSIS — M5414 Radiculopathy, thoracic region: Secondary | ICD-10-CM | POA: Diagnosis not present

## 2015-09-05 DIAGNOSIS — M9902 Segmental and somatic dysfunction of thoracic region: Secondary | ICD-10-CM | POA: Diagnosis not present

## 2015-09-06 DIAGNOSIS — M5414 Radiculopathy, thoracic region: Secondary | ICD-10-CM | POA: Diagnosis not present

## 2015-09-06 DIAGNOSIS — M9902 Segmental and somatic dysfunction of thoracic region: Secondary | ICD-10-CM | POA: Diagnosis not present

## 2015-09-25 ENCOUNTER — Other Ambulatory Visit (INDEPENDENT_AMBULATORY_CARE_PROVIDER_SITE_OTHER): Payer: Self-pay | Admitting: Internal Medicine

## 2015-10-02 DIAGNOSIS — M5414 Radiculopathy, thoracic region: Secondary | ICD-10-CM | POA: Diagnosis not present

## 2015-10-02 DIAGNOSIS — M47812 Spondylosis without myelopathy or radiculopathy, cervical region: Secondary | ICD-10-CM | POA: Diagnosis not present

## 2015-10-09 DIAGNOSIS — M5124 Other intervertebral disc displacement, thoracic region: Secondary | ICD-10-CM | POA: Diagnosis not present

## 2015-10-09 DIAGNOSIS — M47812 Spondylosis without myelopathy or radiculopathy, cervical region: Secondary | ICD-10-CM | POA: Diagnosis not present

## 2015-10-09 DIAGNOSIS — M419 Scoliosis, unspecified: Secondary | ICD-10-CM | POA: Diagnosis not present

## 2015-10-09 DIAGNOSIS — M47816 Spondylosis without myelopathy or radiculopathy, lumbar region: Secondary | ICD-10-CM | POA: Diagnosis not present

## 2015-10-09 DIAGNOSIS — N200 Calculus of kidney: Secondary | ICD-10-CM | POA: Diagnosis not present

## 2015-10-09 DIAGNOSIS — I700 Atherosclerosis of aorta: Secondary | ICD-10-CM | POA: Diagnosis not present

## 2015-10-09 DIAGNOSIS — M47814 Spondylosis without myelopathy or radiculopathy, thoracic region: Secondary | ICD-10-CM | POA: Diagnosis not present

## 2015-10-09 DIAGNOSIS — M5414 Radiculopathy, thoracic region: Secondary | ICD-10-CM | POA: Diagnosis not present

## 2015-10-09 DIAGNOSIS — M5125 Other intervertebral disc displacement, thoracolumbar region: Secondary | ICD-10-CM | POA: Diagnosis not present

## 2015-10-10 DIAGNOSIS — R3914 Feeling of incomplete bladder emptying: Secondary | ICD-10-CM | POA: Diagnosis not present

## 2015-10-10 DIAGNOSIS — N401 Enlarged prostate with lower urinary tract symptoms: Secondary | ICD-10-CM | POA: Diagnosis not present

## 2015-10-10 DIAGNOSIS — N200 Calculus of kidney: Secondary | ICD-10-CM | POA: Diagnosis not present

## 2015-10-10 DIAGNOSIS — R350 Frequency of micturition: Secondary | ICD-10-CM | POA: Diagnosis not present

## 2015-10-17 ENCOUNTER — Encounter: Payer: Self-pay | Admitting: Neurology

## 2015-10-17 ENCOUNTER — Ambulatory Visit (INDEPENDENT_AMBULATORY_CARE_PROVIDER_SITE_OTHER): Payer: Medicare Other | Admitting: Neurology

## 2015-10-17 VITALS — BP 139/70 | HR 55 | Resp 16 | Ht 70.0 in | Wt 192.0 lb

## 2015-10-17 DIAGNOSIS — G4730 Sleep apnea, unspecified: Secondary | ICD-10-CM | POA: Diagnosis not present

## 2015-10-17 DIAGNOSIS — F329 Major depressive disorder, single episode, unspecified: Secondary | ICD-10-CM

## 2015-10-17 DIAGNOSIS — F32A Depression, unspecified: Secondary | ICD-10-CM

## 2015-10-17 DIAGNOSIS — G4700 Insomnia, unspecified: Secondary | ICD-10-CM

## 2015-10-17 DIAGNOSIS — F419 Anxiety disorder, unspecified: Secondary | ICD-10-CM | POA: Diagnosis not present

## 2015-10-17 DIAGNOSIS — G4733 Obstructive sleep apnea (adult) (pediatric): Secondary | ICD-10-CM | POA: Diagnosis not present

## 2015-10-17 DIAGNOSIS — Z9989 Dependence on other enabling machines and devices: Secondary | ICD-10-CM

## 2015-10-17 MED ORDER — ZOLPIDEM TARTRATE ER 12.5 MG PO TBCR: 12.5000 mg | EXTENDED_RELEASE_TABLET | Freq: Every evening | ORAL | Status: DC | PRN

## 2015-10-17 MED ORDER — VENLAFAXINE HCL ER 37.5 MG PO CP24: 37.5000 mg | ORAL_CAPSULE | Freq: Every day | ORAL | Status: DC

## 2015-10-17 MED ORDER — FLUTICASONE PROPIONATE 50 MCG/ACT NA SUSP: NASAL | Status: DC

## 2015-10-17 NOTE — Progress Notes (Signed)
Subjective:    Patient ID: Joel Greene is a 65 y.o. male.  HPI     Interim history:   Joel Greene is a 65 year old right-handed gentleman with an underlying medical history of anxiety, recurrent UTIs, remote Hx of smoking, mild obesity, and nephrolithiasis, who presents for followup consultation of his obstructive sleep apnea, treated with CPAP. He is accompanied by his friend, Joel Greene, today. I last saw him on 10/10/2014, at which time he reported that he recently had rotator cuff surgery on the right, December 2015. He also saw Joel Greene for cervical spine disease. He was supposed to see Joel Greene for neck injections. He was also referred to a hand surgeon for right hand Dupuytren's contracture. As far as his sleep is concerned he was compliant with treatment with CPAP and continued to endorse good results in fact felt he could not sleep without it. For sleep maintenance and sleep initiation issues we had tried sonata and he requested a refill. He was using it about 4 times a week. He had to change his DME company after his insurance changed after he turned 42. He had some neck pain with radiation to the right.   Today, 10/17/2015: I reviewed his CPAP compliance data from 09/16/2015 through 10/15/2015 which is a total of 30 days during which time he used his machine 29 days with percent used days greater than 4 hours at 93%, indicating excellent compliance with an average usage of 8 hours and 45 minutes, residual AHI 2.8 per hour, leak low for the 95th percentile at 1.8 L/m on a pressure of 8 cm.    Today, 10/17/2015: He reports still having issues with sleep maintenance. He is compliant with CPAP. [name] does not help and is no longer covered by his insurance. He has been on generic Ambien 10 mg per primary care physician. He has in the past tried Ambien CR but then it lost formulary status on his insurance. His insurance would cover temazepam of trazodone neither one of which I would recommend.  He has also been on Ativan. He takes the Ambien currently about 3-4 nights per week. He would like to try CR again and would be willing to pain out of pocket. He has had more stress and depression and anxiety, things have been difficult after his mom died. His friend endorses that he is depressed. He denies suicidal ideations. He tried Lexapro but this caused severe insomnia. He is requesting to try something else for his mood disorder.  Previously:   I saw him on 04/11/2014, at which time he reported that sleep maintenance was still an issue from time to time. He was using Ambien as needed. He felt it was not as helpful any longer. He was compliant with treatment. He had no residual symptoms after his recent fall from a ladder.   I reviewed his CPAP compliance data from 09/09/2014 through 10/08/2014 which is a total of 30 days during which time he used his machine every night with percent used days greater than 4 hours of 100%, indicating superb compliance with an average usage of 11 hours and 9 minutes and residual AHI low at 1.7 per hour, leak low with the 95th percentile at 1.4 L/m on a pressure of 8 cm without EPR.  I saw him on 10/12/2013, at which time we talked about his sleep study results from March. We also talked about his compliance data. He reported sleeping much better, feeling much less restless, and less sleepy  during the day. Nocturia had improved. He did report some soreness in his left nostril due to the mask. He had been taking care of his mother's estate who passed away in July 19, 2013. He did not have to use his oxygen at night once he was placed on CPAP therapy. In the interim, he presented to the emergency room on 11/18/2013 after falling off of ladder. He fell while cutting limbs from a tree and fell about 8 feet. He hurt his back and also sustained a laceration of the scrotum.   I reviewed the emergency room records and his imaging test results from 11/18/2013: Dg Thoracic Spine  W/swimmers: Negative.   Dg Lumbar Spine Complete: No acute osseous abnormality of the lumbar spine. Dg Pelvis 1-2 Views: Negative.   Ct Head and cervical spine Wo Contrast: 1. No acute intracranial or cervical spine findings. 2. Cervical spondylosis. 3. Small hypodensity in the right external capsule may represent a small remote lacunar infarct. 4. Minimal chronic left maxillary and ethmoid sinusitis.    I reviewed the patient's CPAP compliance data from 12/07/2013 to 01/05/2014, which is a total of 30 days, during which time the patient used CPAP every day. The average usage for all days was 8 hours and 50 minutes. The percent used days greater than 4 hours was 100 %, indicating superb compliance. The residual AHI was 2.3 per hour, indicating an adequate treatment pressure of 8 cwp with EPR of 1. Air leak from the mask was low at 13.4 L per minute at the 95th percentile.  I reviewed his compliance from 03/10/2014 to 04/08/2014 which is a total of 30 days during which time he used his machine every night with 100% compliance. Average usage of 10 hours and 34 minutes, pressure at 8 cm with EPR of 1. Residual AHI low at 1.6 per hour, leak low 8.6 L per minute for the 95th percentile.    I first met him on 08/22/2013, at which time he reported a recent abnormal overnight pulse oximetry test, which showed periods of recurrent desaturations as low as 62%. He reported snoring and apneic pauses while asleep and waking up with a choking or gasping sensation. He was started on oxygen and reported sleeping much better since then. He has a Hx of prolonged bronchitis some 4 years ago (after he took the flu shot, he says), and was treated with ABx and steroids. He did not take the flu shot after that. He has been on Ambien or Xanax at night. His urologist has recommended a sleep study. He has nasal congestion, and uses OTC afrin. I requested that he return for a sleep study. He had a split-night sleep study in on 08/29/13:  His baseline sleep efficiency was reduced at 80.6% with a latency to sleep of 16 minutes and wake after sleep onset of 14 minutes with moderate sleep fragmentation noted. He had an increased percentage of light stage sleep and absence of slow-wave and REM sleep. He had moderate to loud snoring. His total AHI was 60.7 per hour. Baseline oxygen saturation was only 89% with a nadir of 76%. Time below 88% saturation was 50 minutes and 24 seconds. He was then started on CPAP. Sleep efficiency was markedly increased at 96.7%. He had an improved arousal index. He had a normal percentage of light stage sleep, absence of slow-wave sleep and an increased percentage of REM sleep at 36.5% with a very low REM latency. He was titrated using a medium nasal pillows mask  starting at 5 cm with a final pressure of 8 cm and reduction of his AHI to 1.3 events per hour at a pressure with supine REM sleep achieved. The average oxygen saturation with CPAP was 92% with a nadir of 82%. Time below 88% saturation with CPAP was 2 minutes and 19 seconds. I placed him on CPAP therapy after that.   I reviewed his compliance data from 09/06/2013 through 10/11/2013 which is the last 36 days during which time he uses CPAP every night with a percent used days greater than 4 hours of 94%, indicating excellent compliance, average usage was 8 hours and 7 minutes, residual AHI at 2.6 with a very low leak. Pressure is 8 with EPR of 1.   His Past Medical History Is Significant For: Past Medical History  Diagnosis Date  . IBS (irritable bowel syndrome)   . Glaucoma   . Leukopenia 10/23/2011    Decrease in lymphocytes dating back to 1995.  Marland Kitchen History of tobacco abuse 10/23/2011    History of smoking for 10 years, a pack per day, quitting at age 6.  . Bronchitis   . Renal calculi     His Past Surgical History Is Significant For: Past Surgical History  Procedure Laterality Date  . Cystography    . Appendectomy    . Rotator cuff repair       bilateral shoulders  . Refractive surgery      "to relieve glaucoma"  . Colonoscopy N/A 09/01/2012    Procedure: COLONOSCOPY;  Surgeon: [name], MD;  Location: [facility];  Service: Endoscopy;  Laterality: N/A;  1030  . Esophagogastroduodenoscopy N/A 07/12/2015    Procedure: ESOPHAGOGASTRODUODENOSCOPY (EGD);  Surgeon: [name], MD;  Location: [facility];  Service: Endoscopy;  Laterality: N/A;  2:50 - moved to 1:00 - Ann to notify    His Family History Is Significant For: Family History  Problem Relation Age of Onset  . Breast cancer Mother   . Kidney Stones Brother     His Social History Is Significant For: Social History   Social History  . Marital Status: Divorced    Spouse Name: N/A  . Number of Children: 1  . Years of Education: college   Occupational History  . retired    Social History Main Topics  . Smoking status: Former Smoker -- 2.00 packs/day for 15 years    Types: Cigarettes    Quit date: 07/01/1983  . Smokeless tobacco: Never Used  . Alcohol Use: 0.0 oz/week    0 Standard drinks or equivalent per week     Comment: occasional  . Drug Use: No  . Sexual Activity: Not Asked   Other Topics Concern  . None   Social History Narrative    His Allergies Are:  Allergies  Allergen Reactions  . Clams [Shellfish Allergy] Nausea And Vomiting  . Tetracyclines & Related Rash  . Tylenol [Acetaminophen] Rash  :   His Current Medications Are:  Outpatient Encounter Prescriptions as of 10/17/2015  Medication Sig  . ALPRAZolam (XANAX) 1 MG tablet Take 1 mg by mouth at bedtime as needed for anxiety.   Marland Kitchen aspirin 81 MG tablet Take 81 mg by mouth daily.  Marland Kitchen dicyclomine (BENTYL) 10 MG capsule TAKE 1 CAPSULE 3 TIMES DAILY.  . fluticasone (FLONASE) 50 MCG/ACT nasal spray INHALE 1 SPRAYS IN EACH NOSTRIL ONCE DAILY.  . Multiple Vitamin (MULITIVITAMIN WITH MINERALS) TABS Take 1 tablet by mouth daily.  . pantoprazole (PROTONIX) 40  MG tablet TAKE (1) TABLET BY  MOUTH TWICE A DAY BEFORE MEALS. (BREAKFAST AND SUPPER).  . tamsulosin (FLOMAX) 0.4 MG CAPS capsule Take 1 capsule by mouth daily.  . Vitamin D, Ergocalciferol, (DRISDOL) 50000 UNITS CAPS capsule Take 50,000 Units by mouth every 7 (seven) days.  . [DISCONTINUED] fluticasone (FLONASE) 50 MCG/ACT nasal spray INHALE 2 SPRAYS IN EACH NOSTRIL ONCE DAILY.  . [DISCONTINUED] zolpidem (AMBIEN) 10 MG tablet Take 10 mg by mouth at bedtime as needed for sleep.  Marland Kitchen venlafaxine XR (EFFEXOR XR) 37.5 MG 24 hr capsule Take 1 capsule (37.5 mg total) by mouth daily with breakfast.  . zolpidem (AMBIEN CR) 12.5 MG CR tablet Take 1 tablet (12.5 mg total) by mouth at bedtime as needed for sleep.  . [DISCONTINUED] dicyclomine (BENTYL) 20 MG tablet Take 1 tablet (20 mg total) by mouth every 6 (six) hours as needed for spasms (abdominal cramping).  . [DISCONTINUED] escitalopram (LEXAPRO) 10 MG tablet Take 10 mg by mouth daily.   . [DISCONTINUED] zolpidem (AMBIEN) 10 MG tablet Take 10 mg by mouth at bedtime as needed for sleep.    No facility-administered encounter medications on file as of 10/17/2015.  :  Review of Systems:  Out of a complete 14 point review of systems, all are reviewed and negative with the exception of these symptoms as listed below:   Review of Systems  Neurological:       Patient is here for f/u. Patient feels like she is doing well on CPAP. States that he has had some recent trouble with mouth dryness. He needs a refill of Flonase. Also received a letter in the mail stating that insurance will no longer pay for Zaleplon but recommend trazodone.     Objective:  Neurologic Exam  Physical Exam Physical Examination:   Filed Vitals:   10/17/15 1125  BP: 139/70  Pulse: 55  Resp: 16    General Examination: The patient is a very pleasant 65 y.o. male in no acute distress. He appears well-developed and well-nourished and adequately groomed.   HEENT: Normocephalic, atraumatic, pupils are equal,  round and reactive to light and accommodation. He has bilateral cataracts. Funduscopic exam is normal with sharp disc margins noted. Extraocular tracking is good without limitation to gaze excursion or nystagmus noted. Normal smooth pursuit is noted. Hearing is grossly intact. Face is symmetric with normal facial animation and normal facial sensation. Speech is clear with no dysarthria noted. There is no hypophonia. There is no lip, neck/head, jaw or voice tremor. Neck is supple with full range of passive and active motion. There are no carotid bruits on auscultation. Oropharynx exam reveals: Mild mouth dryness, mild posterior pharynx erythema, adequate dental hygiene and moderate airway crowding, due to thick soft palate and swollen uvula. Mallampati is class II. Tongue protrudes centrally and palate elevates symmetrically. Tonsils are 1+ in size.   Chest: Clear to auscultation without wheezing, rhonchi or crackles noted.  Heart: S1+S2+0, regular and normal without murmurs, rubs or gallops noted.   Abdomen: Soft, non-tender and non-distended with normal bowel sounds appreciated on auscultation.  Extremities: There is no pitting edema in the distal lower extremities bilaterally. Pedal pulses are intact.  Skin: Warm and dry without trophic changes noted. There are no varicose veins.  Musculoskeletal: exam reveals no obvious joint deformities, tenderness or joint swelling or erythema.   Neurologically:  Mental status: The patient is awake, alert and oriented in all 4 spheres. His immediate and remote memory, attention, language skills and  fund of knowledge are appropriate. There is no evidence of aphasia, agnosia, apraxia or anomia. Speech is clear with normal prosody and enunciation. Thought process is linear. Mood is normal and affect is normal.  Cranial nerves II - XII are as described above under HEENT exam. In addition: shoulder shrug is normal with equal shoulder height noted. Motor exam: Normal  bulk, strength and tone is noted. There is no drift, tremor or rebound. Romberg is negative. Reflexes are 2+ throughout. Fine motor skills and coordination: intact.  Sensory exam: intact to light touch in the upper and lower extremities.  Gait, station and balance: He stands easily. No veering to one side is noted. No leaning to one side is noted. Posture is age-appropriate and stance is narrow based. Gait shows normal stride length and normal pace. No problems turning are noted. He turns en bloc. Tandem walk is unremarkable.   Assessment and Plan:   In summary, NELVIN TOMB is a very pleasant 65 year old male with an underlying medical history of anxiety, recurrent UTIs, remote Hx of smoking, mild obesity, and nephrolithiasis, who presents for followup consultation of his severe obstructive sleep apnea, treated well with CPAP at a pressure of 8 cwp with superb compliance. In the interim, he has had right shoulder rotator cuff repair under Dr. Noemi Chapel. He has done excellent after that. He has had neck pain. He has an ongoing issues with sleep maintenance problems. He is currently on Ambien 10 mg as needed per primary care physician. We mutually agreed to switch this to Ambien CR 12.5 mg as needed at night. He is discouraged from using this chronically and every night. He's also advised not to combine it with generic regular Ambien. He is advised to continue with CPAP therapy regularly and commended for his compliance. Furthermore, for his depression and anxiety I suggested we try him on Effexor XR 37.5 mg once daily. He did not do well with Lexapro. I provided him with new prescriptions for Ambien CR, and renewed his prescription for Flonase, and Effexor XR. He is advised to be cautious with daily use of Flonase and he stated that he does not use it every day. Since we are making some changes, I suggested a 6 month follow-up, sooner if needed. He is advised about potential side effects with the antidepressant  in particular he is advised that there can be worsening depression and even suicidal thoughts on an antidepressant and he is advised to be very mindful of this. He is encouraged to call with any interim questions or concerns. I will see him back in 6 months, sooner if needed. I answered all his questions today and the patient was in agreement.  Most of my 25 minute visit today was spent in counseling and coordination of care, reviewing test results and reviewing medications and the Dx of OSA.

## 2015-10-17 NOTE — Patient Instructions (Signed)
We can try Ambien CR 12.5 mg as needed for sleep. You cannot combine it with regular ambien, which is provided by Dr. Gerarda Fraction. For your mood, we will try Effexor XR 37.5 mg once daily in AM. Side effects include mouth dryness, drowsiness, confusion, dizziness. Please remember, any new antidepressant can cause worsening depression, even suicidal thoughts.

## 2015-10-29 HISTORY — PX: SP FL GUIDE SPINAL INJ: HXRAD450

## 2015-10-31 DIAGNOSIS — M47812 Spondylosis without myelopathy or radiculopathy, cervical region: Secondary | ICD-10-CM | POA: Diagnosis not present

## 2015-10-31 DIAGNOSIS — M5414 Radiculopathy, thoracic region: Secondary | ICD-10-CM | POA: Diagnosis not present

## 2015-11-01 ENCOUNTER — Other Ambulatory Visit: Payer: Self-pay | Admitting: Neurosurgery

## 2015-11-01 DIAGNOSIS — M5414 Radiculopathy, thoracic region: Secondary | ICD-10-CM

## 2015-11-07 ENCOUNTER — Ambulatory Visit
Admission: RE | Admit: 2015-11-07 | Discharge: 2015-11-07 | Disposition: A | Discharge status: Home / Self Care | Payer: Medicare Other | Source: Ambulatory Visit | Attending: Neurosurgery | Admitting: Neurosurgery

## 2015-11-07 DIAGNOSIS — M5414 Radiculopathy, thoracic region: Secondary | ICD-10-CM

## 2015-11-07 DIAGNOSIS — M5184 Other intervertebral disc disorders, thoracic region: Secondary | ICD-10-CM | POA: Diagnosis not present

## 2015-11-07 MED ORDER — METHYLPREDNISOLONE ACETATE 40 MG/ML INJ SUSP (RADIOLOG
120.0000 mg | Freq: Once | INTRAMUSCULAR | Status: AC
  Administered 2015-11-07: 120 mg via EPIDURAL

## 2015-11-07 MED ORDER — IOPAMIDOL (ISOVUE-M 200) INJECTION 41%
1.0000 mL | Freq: Once | INTRAMUSCULAR | Status: AC
  Administered 2015-11-07: 1 mL via EPIDURAL

## 2015-11-07 NOTE — Discharge Instructions (Signed)

## 2015-11-12 ENCOUNTER — Emergency Department (HOSPITAL_COMMUNITY): Payer: Medicare Other

## 2015-11-12 ENCOUNTER — Encounter (HOSPITAL_COMMUNITY): Payer: Self-pay

## 2015-11-12 ENCOUNTER — Emergency Department (HOSPITAL_COMMUNITY)
Admission: EM | Admit: 2015-11-12 | Discharge: 2015-11-12 | Disposition: A | Discharge status: Home / Self Care | Payer: Medicare Other | Attending: Emergency Medicine | Admitting: Emergency Medicine

## 2015-11-12 DIAGNOSIS — M542 Cervicalgia: Secondary | ICD-10-CM | POA: Diagnosis not present

## 2015-11-12 DIAGNOSIS — M546 Pain in thoracic spine: Secondary | ICD-10-CM | POA: Diagnosis not present

## 2015-11-12 DIAGNOSIS — Z87891 Personal history of nicotine dependence: Secondary | ICD-10-CM | POA: Diagnosis not present

## 2015-11-12 DIAGNOSIS — Z7982 Long term (current) use of aspirin: Secondary | ICD-10-CM | POA: Insufficient documentation

## 2015-11-12 DIAGNOSIS — M549 Dorsalgia, unspecified: Secondary | ICD-10-CM | POA: Diagnosis not present

## 2015-11-12 LAB — COMPREHENSIVE METABOLIC PANEL
ALK PHOS: 58 U/L (ref 38–126)
ALT: 21 U/L (ref 17–63)
AST: 19 U/L (ref 15–41)
Albumin: 4.7 g/dL (ref 3.5–5.0)
Anion gap: 11 (ref 5–15)
BILIRUBIN TOTAL: 0.9 mg/dL (ref 0.3–1.2)
BUN: 13 mg/dL (ref 6–20)
CALCIUM: 9.2 mg/dL (ref 8.9–10.3)
CO2: 21 mmol/L — ABNORMAL LOW (ref 22–32)
CREATININE: 0.75 mg/dL (ref 0.61–1.24)
Chloride: 101 mmol/L (ref 101–111)
GFR calc Af Amer: >60 mL/min (ref >60)
GLUCOSE: 115 mg/dL — AB (ref 65–99)
POTASSIUM: 3.3 mmol/L — AB (ref 3.5–5.1)
Sodium: 133 mmol/L — ABNORMAL LOW (ref 135–145)
TOTAL PROTEIN: 7.3 g/dL (ref 6.5–8.1)

## 2015-11-12 LAB — CBC
HCT: 45.7 % (ref 39.0–52.0)
Hemoglobin: 16.6 g/dL (ref 13.0–17.0)
MCH: 33.1 pg (ref 26.0–34.0)
MCHC: 36.3 g/dL — AB (ref 30.0–36.0)
MCV: 91.2 fL (ref 78.0–100.0)
PLATELETS: 197 10*3/uL (ref 150–400)
RBC: 5.01 MIL/uL (ref 4.22–5.81)
RDW: 11.7 % (ref 11.5–15.5)
WBC: 5 10*3/uL (ref 4.0–10.5)

## 2015-11-12 LAB — URINALYSIS, ROUTINE W REFLEX MICROSCOPIC
BILIRUBIN URINE: NEGATIVE
Glucose, UA: NEGATIVE mg/dL
Hgb urine dipstick: NEGATIVE
KETONES UR: 40 mg/dL — AB
LEUKOCYTES UA: NEGATIVE
NITRITE: NEGATIVE
PH: 5.5 (ref 5.0–8.0)
PROTEIN: NEGATIVE mg/dL
Specific Gravity, Urine: 1.015 (ref 1.005–1.030)

## 2015-11-12 LAB — CBG MONITORING, ED: Glucose-Capillary: 117 mg/dL — ABNORMAL HIGH (ref 65–99)

## 2015-11-12 MED ORDER — METHYLPREDNISOLONE SODIUM SUCC 125 MG IJ SOLR
125.0000 mg | Freq: Once | INTRAMUSCULAR | Status: AC
  Administered 2015-11-12: 125 mg via INTRAVENOUS
  Filled 2015-11-12: qty 2

## 2015-11-12 MED ORDER — CYCLOBENZAPRINE HCL 10 MG PO TABS: 10.0000 mg | ORAL_TABLET | Freq: Three times a day (TID) | ORAL | Status: DC | PRN

## 2015-11-12 NOTE — Discharge Instructions (Signed)
Follow up with Dr. Carloyn Manner your neurosurgeon

## 2015-11-12 NOTE — ED Provider Notes (Signed)
CSN: [id number]     Arrival date & time 11/12/15  1343 History   First MD Initiated Contact with Patient 11/12/15 1457     Chief Complaint  Patient presents with  . Back Pain     (Consider location/radiation/quality/duration/timing/severity/associated sxs/prior Treatment) Patient is a 65 y.o. male presenting with back pain. The history is provided by the patient (Patient complains of neck and back pain. He recently had an injection in his thoracic spine from the neurosurgeon).  Back Pain Location:  Thoracic spine (Posterior neck pain) Quality:  Aching Pain severity:  Moderate Pain is:  Same all the time Onset quality:  Gradual Timing:  Constant Progression:  Waxing and waning Associated symptoms: no abdominal pain, no chest pain and no headaches     Past Medical History  Diagnosis Date  . IBS (irritable bowel syndrome)   . Glaucoma   . Leukopenia 10/23/2011    Decrease in lymphocytes dating back to 1995.  Marland Kitchen History of tobacco abuse 10/23/2011    History of smoking for 10 years, a pack per day, quitting at age 15.  . Bronchitis   . Renal calculi    Past Surgical History  Procedure Laterality Date  . Cystography    . Appendectomy    . Rotator cuff repair      bilateral shoulders  . Refractive surgery      "to relieve glaucoma"  . Colonoscopy N/A 09/01/2012    Procedure: COLONOSCOPY;  Surgeon: [name], MD;  [facility]: [facility];  Service: Endoscopy;  Laterality: N/A;  1030  . Esophagogastroduodenoscopy N/A 07/12/2015    Procedure: ESOPHAGOGASTRODUODENOSCOPY (EGD);  Surgeon: [name], MD;  Location: [facility];  Service: Endoscopy;  Laterality: N/A;  2:50 - moved to 1:00 - Ann to notify   Family History  Problem Relation Age of Onset  . Breast cancer Mother   . Kidney Stones Brother    Social History  Substance Use Topics  . Smoking status: Former Smoker -- 2.00 packs/day for 15 years    Types: Cigarettes    Quit date: 07/01/1983  . Smokeless  tobacco: Never Used  . Alcohol Use: 0.0 oz/week    0 Standard drinks or equivalent per week     Comment: occasional    Review of Systems  Constitutional: Negative for appetite change and fatigue.  HENT: Negative for congestion, ear discharge and sinus pressure.   Eyes: Negative for discharge.  Respiratory: Negative for cough.   Cardiovascular: Negative for chest pain.  Gastrointestinal: Negative for abdominal pain and diarrhea.  Genitourinary: Negative for frequency and hematuria.  Musculoskeletal: Positive for back pain.       Neck and back pain  Skin: Negative for rash.  Neurological: Negative for seizures and headaches.  Psychiatric/Behavioral: Negative for hallucinations.      Allergies  Clams; Tetracyclines & related; and Tylenol  Home Medications   Prior to Admission medications   Medication Sig Start Date End Date Taking? Authorizing Provider  ALPRAZolam Duanne Moron) 1 MG tablet Take 1 mg by mouth at bedtime as needed for anxiety.     Historical Provider, MD  aspirin 81 MG tablet Take 81 mg by mouth daily.    Historical Provider, MD  cyclobenzaprine (FLEXERIL) 10 MG tablet Take 1 tablet (10 mg total) by mouth 3 (three) times daily as needed for muscle spasms. 11/12/15   [name], MD  dicyclomine (BENTYL) 10 MG capsule TAKE 1 CAPSULE 3 TIMES DAILY. 07/30/15   Butch Penny,  NP  fluticasone (FLONASE) 50 MCG/ACT nasal spray INHALE 1 SPRAYS IN EACH NOSTRIL ONCE DAILY. 10/17/15   [name], MD  Multiple Vitamin (MULITIVITAMIN WITH MINERALS) TABS Take 1 tablet by mouth daily.    Historical Provider, MD  pantoprazole (PROTONIX) 40 MG tablet TAKE (1) TABLET BY MOUTH TWICE A DAY BEFORE MEALS. (BREAKFAST AND SUPPER). 09/27/15   [name], MD  tamsulosin (FLOMAX) 0.4 MG CAPS capsule Take 1 capsule by mouth daily. 07/13/13   Historical Provider, MD  venlafaxine XR (EFFEXOR XR) 37.5 MG 24 hr capsule Take 1 capsule (37.5 mg total) by mouth daily with breakfast. 10/17/15   [name], MD  Vitamin D, Ergocalciferol, (DRISDOL) 50000 UNITS CAPS capsule Take 50,000 Units by mouth every 7 (seven) days.    Historical Provider, MD  zolpidem (AMBIEN CR) 12.5 MG CR tablet Take 1 tablet (12.5 mg total) by mouth at bedtime as needed for sleep. 10/17/15   [name], MD   BP 170/95 mmHg  Pulse 65  Temp(Src) 97.7 F (36.5 C) (Oral)  Resp 16  Ht 5\' 10"  (1.778 m)  Wt 180 lb (81.647 kg)  BMI 25.83 kg/m2  SpO2 98% Physical Exam  Constitutional: He is oriented to person, place, and time. He appears well-developed.  HENT:  Head: Normocephalic.  Eyes: Conjunctivae and EOM are normal. No scleral icterus.  Neck: Neck supple. No thyromegaly present.  Cardiovascular: Normal rate and regular rhythm.  Exam reveals no gallop and no friction rub.   No murmur heard. Pulmonary/Chest: No stridor. He has no wheezes. He has no rales. He exhibits no tenderness.  Abdominal: He exhibits no distension. There is no tenderness. There is no rebound.  Musculoskeletal: Normal range of motion. He exhibits no edema.  Patient tender in posterior neck and upper thoracic spine  Lymphadenopathy:    He has no cervical adenopathy.  Neurological: He is oriented to person, place, and time. He exhibits normal muscle tone. Coordination normal.  Skin: No rash noted. No erythema.  Psychiatric: He has a normal mood and affect. His behavior is normal.    ED Course  Procedures (including critical care time) Labs Review Labs Reviewed  COMPREHENSIVE METABOLIC PANEL - Abnormal; Notable for the following:    Sodium 133 (*)    Potassium 3.3 (*)    CO2 21 (*)    Glucose, Bld 115 (*)    All other components within normal limits  CBC - Abnormal; Notable for the following:    MCHC 36.3 (*)    All other components within normal limits  URINALYSIS, ROUTINE W REFLEX MICROSCOPIC (NOT AT [facility]) - Abnormal; Notable for the following:    Ketones, ur 40 (*)    All other components within normal limits  CBG MONITORING,  ED - Abnormal; Notable for the following:    Glucose-Capillary 117 (*)    All other components within normal limits    Imaging Review Dg Chest 2 View  11/12/2015  CLINICAL DATA:  Back pain. EXAM: CHEST  2 VIEW COMPARISON:  January 30, 2013. FINDINGS: The heart size and mediastinal contours are within normal limits. Both lungs are clear. No pneumothorax or pleural effusion is noted. The visualized skeletal structures are unremarkable. IMPRESSION: No active cardiopulmonary disease. Electronically Signed   By: Marijo Conception, M.D.   On: 11/12/2015 16:23   Dg Cervical Spine Complete  11/12/2015  CLINICAL DATA:  Intermittent cervicalgia for 1 year EXAM: CERVICAL SPINE - COMPLETE 4+ VIEW COMPARISON:  Cervical MRI May 13, 2014 FINDINGS: Frontal, lateral, open-mouth odontoid, and bilateral oblique views were obtained. There is no fracture or spondylolisthesis. Prevertebral soft tissues and predental space regions are normal. There is a moderately severe disc space narrowing at C5-6. There is moderate disc space narrowing at C4-5 and C6-7. There is facet hypertrophy with exit foraminal narrowing at C5-6 bilaterally and at C6-7 on the right. There is calcification in the nuchal ligament posterior to C6. IMPRESSION: Multilevel osteoarthritic change.  No fracture or spondylolisthesis. Electronically Signed   By: Lowella Grip III M.D.   On: 11/12/2015 16:29   I have personally reviewed and evaluated these images and lab results as part of my medical decision-making.   EKG Interpretation None      MDM   Final diagnoses:  Neck pain, acute    Patient with severe osteoarthritis in his neck. He has been prescribed oxycodone from his neurosurgeon. He does not take it because it keeps him awake. Patient given prescription of Flexeril and told to follow back up with the neurosurgeon    [name], MD 11/12/15 [phone]

## 2015-11-12 NOTE — ED Notes (Signed)
Pt reports that he has been experiencing lower back pain that has worsened over the last month . Pt reports that "its my kidneys".  Reports passing some clots in urine. Reports steroid injection in back on 5/10. Reports left side of lip drawing  up and left eye closing for a month

## 2015-11-25 ENCOUNTER — Encounter (HOSPITAL_COMMUNITY): Payer: Self-pay | Admitting: *Deleted

## 2015-11-25 ENCOUNTER — Inpatient Hospital Stay (HOSPITAL_COMMUNITY)
Admission: EM | Admit: 2015-11-25 | Discharge: 2015-11-28 | DRG: 282 | Disposition: A | Discharge status: Home / Self Care | Payer: Medicare Other | Attending: Cardiovascular Disease | Admitting: Cardiovascular Disease

## 2015-11-25 ENCOUNTER — Inpatient Hospital Stay (HOSPITAL_COMMUNITY): Payer: Medicare Other

## 2015-11-25 DIAGNOSIS — Z87891 Personal history of nicotine dependence: Secondary | ICD-10-CM | POA: Diagnosis not present

## 2015-11-25 DIAGNOSIS — IMO0001 Reserved for inherently not codable concepts without codable children: Secondary | ICD-10-CM

## 2015-11-25 DIAGNOSIS — Z886 Allergy status to analgesic agent status: Secondary | ICD-10-CM | POA: Diagnosis not present

## 2015-11-25 DIAGNOSIS — R079 Chest pain, unspecified: Secondary | ICD-10-CM | POA: Diagnosis present

## 2015-11-25 DIAGNOSIS — K219 Gastro-esophageal reflux disease without esophagitis: Secondary | ICD-10-CM | POA: Diagnosis not present

## 2015-11-25 DIAGNOSIS — H409 Unspecified glaucoma: Secondary | ICD-10-CM | POA: Diagnosis present

## 2015-11-25 DIAGNOSIS — R0781 Pleurodynia: Secondary | ICD-10-CM

## 2015-11-25 DIAGNOSIS — Z7982 Long term (current) use of aspirin: Secondary | ICD-10-CM | POA: Diagnosis not present

## 2015-11-25 DIAGNOSIS — F419 Anxiety disorder, unspecified: Secondary | ICD-10-CM | POA: Diagnosis present

## 2015-11-25 DIAGNOSIS — Z91013 Allergy to seafood: Secondary | ICD-10-CM

## 2015-11-25 DIAGNOSIS — R030 Elevated blood-pressure reading, without diagnosis of hypertension: Secondary | ICD-10-CM | POA: Diagnosis not present

## 2015-11-25 DIAGNOSIS — R109 Unspecified abdominal pain: Secondary | ICD-10-CM | POA: Insufficient documentation

## 2015-11-25 DIAGNOSIS — R39859 Costovertebral (angle) tenderness, unspecified side: Secondary | ICD-10-CM

## 2015-11-25 DIAGNOSIS — Z881 Allergy status to other antibiotic agents status: Secondary | ICD-10-CM

## 2015-11-25 DIAGNOSIS — I214 Non-ST elevation (NSTEMI) myocardial infarction: Principal | ICD-10-CM

## 2015-11-25 DIAGNOSIS — R0602 Shortness of breath: Secondary | ICD-10-CM | POA: Diagnosis present

## 2015-11-25 DIAGNOSIS — I119 Hypertensive heart disease without heart failure: Secondary | ICD-10-CM | POA: Diagnosis present

## 2015-11-25 DIAGNOSIS — K279 Peptic ulcer, site unspecified, unspecified as acute or chronic, without hemorrhage or perforation: Secondary | ICD-10-CM | POA: Diagnosis present

## 2015-11-25 DIAGNOSIS — R778 Other specified abnormalities of plasma proteins: Secondary | ICD-10-CM | POA: Diagnosis present

## 2015-11-25 DIAGNOSIS — K589 Irritable bowel syndrome without diarrhea: Secondary | ICD-10-CM | POA: Diagnosis present

## 2015-11-25 DIAGNOSIS — N200 Calculus of kidney: Secondary | ICD-10-CM | POA: Diagnosis not present

## 2015-11-25 DIAGNOSIS — M549 Dorsalgia, unspecified: Secondary | ICD-10-CM

## 2015-11-25 DIAGNOSIS — E876 Hypokalemia: Secondary | ICD-10-CM | POA: Diagnosis present

## 2015-11-25 DIAGNOSIS — I10 Essential (primary) hypertension: Secondary | ICD-10-CM | POA: Diagnosis present

## 2015-11-25 DIAGNOSIS — I249 Acute ischemic heart disease, unspecified: Secondary | ICD-10-CM | POA: Insufficient documentation

## 2015-11-25 DIAGNOSIS — Z79899 Other long term (current) drug therapy: Secondary | ICD-10-CM | POA: Diagnosis not present

## 2015-11-25 DIAGNOSIS — Z23 Encounter for immunization: Secondary | ICD-10-CM

## 2015-11-25 DIAGNOSIS — R0789 Other chest pain: Secondary | ICD-10-CM

## 2015-11-25 DIAGNOSIS — M1990 Unspecified osteoarthritis, unspecified site: Secondary | ICD-10-CM | POA: Diagnosis present

## 2015-11-25 DIAGNOSIS — R0600 Dyspnea, unspecified: Secondary | ICD-10-CM | POA: Diagnosis not present

## 2015-11-25 DIAGNOSIS — R7989 Other specified abnormal findings of blood chemistry: Secondary | ICD-10-CM | POA: Diagnosis present

## 2015-11-25 HISTORY — DX: Unspecified osteoarthritis, unspecified site: M19.90

## 2015-11-25 HISTORY — DX: Sleep apnea, unspecified: G47.30

## 2015-11-25 HISTORY — DX: Peptic ulcer, site unspecified, unspecified as acute or chronic, without hemorrhage or perforation: K27.9

## 2015-11-25 LAB — URINALYSIS, ROUTINE W REFLEX MICROSCOPIC
BILIRUBIN URINE: NEGATIVE
GLUCOSE, UA: NEGATIVE mg/dL
HGB URINE DIPSTICK: NEGATIVE
Leukocytes, UA: NEGATIVE
Nitrite: NEGATIVE
PROTEIN: NEGATIVE mg/dL
Specific Gravity, Urine: <1.005 — ABNORMAL LOW (ref 1.005–1.030)
pH: 6 (ref 5.0–8.0)

## 2015-11-25 LAB — COMPREHENSIVE METABOLIC PANEL
ALBUMIN: 4.4 g/dL (ref 3.5–5.0)
ALT: 80 U/L — ABNORMAL HIGH (ref 17–63)
AST: 95 U/L — AB (ref 15–41)
Alkaline Phosphatase: 57 U/L (ref 38–126)
Anion gap: 9 (ref 5–15)
BILIRUBIN TOTAL: 1.1 mg/dL (ref 0.3–1.2)
BUN: 9 mg/dL (ref 6–20)
CHLORIDE: 98 mmol/L — AB (ref 101–111)
CO2: 28 mmol/L (ref 22–32)
Calcium: 9.5 mg/dL (ref 8.9–10.3)
Creatinine, Ser: 0.85 mg/dL (ref 0.61–1.24)
GFR calc Af Amer: >60 mL/min (ref >60)
GFR calc non Af Amer: >60 mL/min (ref >60)
GLUCOSE: 127 mg/dL — AB (ref 65–99)
POTASSIUM: 3.2 mmol/L — AB (ref 3.5–5.1)
Sodium: 135 mmol/L (ref 135–145)
Total Protein: 7.1 g/dL (ref 6.5–8.1)

## 2015-11-25 LAB — CBC WITH DIFFERENTIAL/PLATELET
Basophils Absolute: 0 10*3/uL (ref 0.0–0.1)
Basophils Relative: 0 %
EOS ABS: 0 10*3/uL (ref 0.0–0.7)
EOS PCT: 0 %
HCT: 44 % (ref 39.0–52.0)
Hemoglobin: 15.8 g/dL (ref 13.0–17.0)
LYMPHS ABS: 0.7 10*3/uL (ref 0.7–4.0)
Lymphocytes Relative: 14 %
MCH: 32.7 pg (ref 26.0–34.0)
MCHC: 35.9 g/dL (ref 30.0–36.0)
MCV: 91.1 fL (ref 78.0–100.0)
MONO ABS: 0.6 10*3/uL (ref 0.1–1.0)
Monocytes Relative: 13 %
Neutro Abs: 3.6 10*3/uL (ref 1.7–7.7)
Neutrophils Relative %: 73 %
PLATELETS: 208 10*3/uL (ref 150–400)
RBC: 4.83 MIL/uL (ref 4.22–5.81)
RDW: 11.5 % (ref 11.5–15.5)
WBC: 5 10*3/uL (ref 4.0–10.5)

## 2015-11-25 LAB — APTT: aPTT: >200 seconds (ref 24–37)

## 2015-11-25 LAB — HEPARIN LEVEL (UNFRACTIONATED)
HEPARIN UNFRACTIONATED: 0.34 [IU]/mL (ref 0.30–0.70)
Heparin Unfractionated: 0.32 IU/mL (ref 0.30–0.70)

## 2015-11-25 LAB — TROPONIN I
TROPONIN I: 0.41 ng/mL — AB (ref <0.031)
Troponin I: 0.41 ng/mL — ABNORMAL HIGH (ref <0.031)
Troponin I: 0.33 ng/mL — ABNORMAL HIGH (ref <0.031)
Troponin I: 0.29 ng/mL — ABNORMAL HIGH (ref <0.031)

## 2015-11-25 LAB — PROTIME-INR
INR: 1.14 (ref 0.00–1.49)
Prothrombin Time: 14.8 seconds (ref 11.6–15.2)

## 2015-11-25 MED ORDER — ASPIRIN 81 MG PO CHEW
324.0000 mg | CHEWABLE_TABLET | Freq: Once | ORAL | Status: AC
  Administered 2015-11-25: 324 mg via ORAL
  Filled 2015-11-25: qty 4

## 2015-11-25 MED ORDER — POTASSIUM CHLORIDE CRYS ER 20 MEQ PO TBCR
40.0000 meq | EXTENDED_RELEASE_TABLET | Freq: Once | ORAL | Status: AC
  Administered 2015-11-25: 40 meq via ORAL
  Filled 2015-11-25: qty 2

## 2015-11-25 MED ORDER — FLUTICASONE PROPIONATE 50 MCG/ACT NA SUSP: 1.0000 | Freq: Every day | NASAL | Status: DC | PRN

## 2015-11-25 MED ORDER — VENLAFAXINE HCL ER 37.5 MG PO CP24
37.5000 mg | ORAL_CAPSULE | Freq: Every day | ORAL | Status: DC
  Administered 2015-11-26 – 2015-11-28 (×2): 37.5 mg via ORAL
  Filled 2015-11-25 (×3): qty 1

## 2015-11-25 MED ORDER — METOPROLOL TARTRATE 12.5 MG HALF TABLET
12.5000 mg | ORAL_TABLET | Freq: Two times a day (BID) | ORAL | Status: DC
  Administered 2015-11-25 – 2015-11-28 (×7): 12.5 mg via ORAL
  Filled 2015-11-25 (×7): qty 1

## 2015-11-25 MED ORDER — NITROGLYCERIN 0.4 MG SL SUBL: 0.4000 mg | SUBLINGUAL_TABLET | SUBLINGUAL | Status: DC | PRN

## 2015-11-25 MED ORDER — ASPIRIN EC 81 MG PO TBEC
81.0000 mg | DELAYED_RELEASE_TABLET | Freq: Every day | ORAL | Status: DC
  Administered 2015-11-26 – 2015-11-28 (×3): 81 mg via ORAL
  Filled 2015-11-25 (×3): qty 1

## 2015-11-25 MED ORDER — PANTOPRAZOLE SODIUM 40 MG PO TBEC
40.0000 mg | DELAYED_RELEASE_TABLET | Freq: Two times a day (BID) | ORAL | Status: DC
  Administered 2015-11-26 – 2015-11-28 (×5): 40 mg via ORAL
  Filled 2015-11-25 (×6): qty 1

## 2015-11-25 MED ORDER — CYCLOBENZAPRINE HCL 10 MG PO TABS: 10.0000 mg | ORAL_TABLET | Freq: Three times a day (TID) | ORAL | Status: DC | PRN

## 2015-11-25 MED ORDER — ATORVASTATIN CALCIUM 40 MG PO TABS
40.0000 mg | ORAL_TABLET | Freq: Every day | ORAL | Status: DC
Start: 2015-11-25 — End: 2015-11-28
  Administered 2015-11-26 – 2015-11-27 (×2): 40 mg via ORAL
  Filled 2015-11-25 (×3): qty 1

## 2015-11-25 MED ORDER — ALPRAZOLAM 0.5 MG PO TABS
1.0000 mg | ORAL_TABLET | Freq: Every evening | ORAL | Status: DC | PRN
  Administered 2015-11-26 – 2015-11-27 (×3): 1 mg via ORAL
  Filled 2015-11-25 (×3): qty 2

## 2015-11-25 MED ORDER — HEPARIN BOLUS VIA INFUSION
4000.0000 [IU] | Freq: Once | INTRAVENOUS | Status: AC
  Administered 2015-11-25: 4000 [IU] via INTRAVENOUS

## 2015-11-25 MED ORDER — PNEUMOCOCCAL VAC POLYVALENT 25 MCG/0.5ML IJ INJ
0.5000 mL | INJECTION | INTRAMUSCULAR | Status: AC
  Administered 2015-11-26: 0.5 mL via INTRAMUSCULAR
  Filled 2015-11-25: qty 0.5

## 2015-11-25 MED ORDER — HEPARIN (PORCINE) IN NACL 100-0.45 UNIT/ML-% IJ SOLN
1100.0000 [IU]/h | INTRAMUSCULAR | Status: DC
  Administered 2015-11-25: 1000 [IU]/h via INTRAVENOUS
  Administered 2015-11-25: 950 [IU]/h via INTRAVENOUS
  Filled 2015-11-25 (×3): qty 250

## 2015-11-25 MED ORDER — ADULT MULTIVITAMIN W/MINERALS CH
1.0000 | ORAL_TABLET | Freq: Every day | ORAL | Status: DC
  Administered 2015-11-26 – 2015-11-28 (×3): 1 via ORAL
  Filled 2015-11-25 (×4): qty 1

## 2015-11-25 MED ORDER — ASPIRIN 81 MG PO TABS: 81.0000 mg | ORAL_TABLET | Freq: Every day | ORAL | Status: DC

## 2015-11-25 MED ORDER — ONDANSETRON HCL 4 MG/2ML IJ SOLN: 4.0000 mg | Freq: Four times a day (QID) | INTRAMUSCULAR | Status: DC | PRN

## 2015-11-25 MED ORDER — TAMSULOSIN HCL 0.4 MG PO CAPS
0.4000 mg | ORAL_CAPSULE | Freq: Every day | ORAL | Status: DC
  Administered 2015-11-26 – 2015-11-28 (×3): 0.4 mg via ORAL
  Filled 2015-11-25 (×3): qty 1

## 2015-11-25 NOTE — Progress Notes (Signed)
ANTICOAGULATION CONSULT NOTE - Follow Up Consult  Pharmacy Consult for Heparin Indication: chest pain/ACS  Allergies  Allergen Reactions  . Clams [Shellfish Allergy] Nausea And Vomiting  . Tetracyclines & Related Rash  . Tylenol [Acetaminophen] Rash    Patient Measurements: Height: 5\' 10"  (177.8 cm) Weight: 173 lb 11.6 oz (78.8 kg) (bed scale ) IBW/kg (Calculated) : 73 Heparin Dosing Weight: 78.8 kg  Vital Signs: Temp: 98.6 F (37 C) (05/28 1247) Temp Source: Oral (05/28 1247) BP: 128/82 mmHg (05/28 1247) Pulse Rate: 71 (05/28 1247)  Labs:  Recent Labs  11/25/15 0045 11/25/15 0423 11/25/15 1015 11/25/15 1228 11/25/15 1637 11/25/15 1954  HGB 15.8  --   --   --   --   --   HCT 44.0  --   --   --   --   --   PLT 208  --   --   --   --   --   APTT  --  >200*  --   --   --   --   LABPROT  --  14.8  --   --   --   --   INR  --  1.14  --   --   --   --   HEPARINUNFRC  --   --   --  0.32  --  0.34  CREATININE 0.85  --   --   --   --   --   TROPONINI 0.41* 0.41* 0.33*  --  0.29*  --     Estimated Creatinine Clearance: 88.3 mL/min (by C-G formula based on Cr of 0.85).    Assessment: 65 yo male presented to [facility] ED with elevated BP over past 2 days. Transferred to [facility], plan for Coronary angiography to be performed 5/30 Tuesday.  - HL 0.32, 0.34 remains in goal range.  Goal of Therapy:  Heparin level 0.3-0.7 units/ml Monitor platelets by anticoagulation protocol: Yes   Plan:  Continue IV heparin at 1000 units/hr Daily HL and CBC   Lacora Folmer S. Alford Highland, PharmD, BCPS Clinical Staff Pharmacist Pager [phone]  Eilene Ghazi Stillinger 11/25/2015,8:48 PM

## 2015-11-25 NOTE — Progress Notes (Signed)
Pt. s/u on CPAP with small nasal mask for h/s on room air, humidity filled, tolerating well, aware to notify if needed, RT to monitor.

## 2015-11-25 NOTE — H&P (Signed)
Patient ID: Joel Greene MRN: [id number], DOB/AGE: [birth year]   Admit date: 11/25/2015   Primary Physician: [name], MD Primary Cardiologist: [name] (new)  HPI: 65 y/o male with a history of remote smoking, PUD by endoscopy in Jan 2017, anxiety, sleep apnea, and DJD. He was seen in 2009 for chest pain and had an echo which presumably was normal.  He has had some back pain recently and had a spinal injection 11/07/15. He tells me Thursday he had some DOE but it passed. Sat morning while walking he had an episode of SOB, chest tightness, palpitations, and became diaphoretic. He still felt SOB when he got back to his house. His sym[ptoms were present "all day". Sat PM a neighbor checked his B/P and told him it was "sky high" and suggested he go to the ED. In the ED his B/P was 150/100. His Troponin was elevated 0.41 and 0.41. Heparin was started and he was transferred to [facility] this am. He feels better currently but has a multitude of various complaints including bilateral flank pain, numbness around his mouth, as well as vague SOB.    Problem List: Past Medical History  Diagnosis Date  . IBS (irritable bowel syndrome)   . Glaucoma   . Leukopenia 10/23/2011    Decrease in lymphocytes dating back to 1995.  Marland Kitchen History of tobacco abuse 10/23/2011    History of smoking for 10 years, a pack per day, quitting at age 65.  . Bronchitis   . Renal calculi   . PUD (peptic ulcer disease) Jan 2017    endoscopy  . Sleep apnea     on C-pap  . DJD (degenerative joint disease)     Past Surgical History  Procedure Laterality Date  . Cystography    . Appendectomy    . Rotator cuff repair      bilateral shoulders  . Refractive surgery      "to relieve glaucoma"  . Colonoscopy N/A 09/01/2012    Procedure: COLONOSCOPY;  Surgeon: [name], MD;  Location: [facility];  Service: Endoscopy;  Laterality: N/A;  1030  . Esophagogastroduodenoscopy N/A 07/12/2015    Procedure:  ESOPHAGOGASTRODUODENOSCOPY (EGD);  Surgeon: [name], MD;  [facility];  Service: Endoscopy;  Laterality: N/A;  2:50 - moved to 1:00 - Ann to notify  . Sp fl guide spinal inj  May 2017     Allergies:  Allergies  Allergen Reactions  . Clams [Shellfish Allergy] Nausea And Vomiting  . Tetracyclines & Related Rash  . Tylenol [Acetaminophen] Rash     Home Medications Prior to Admission medications   Medication Sig Start Date End Date Taking? Authorizing Provider  ALPRAZolam Duanne Moron) 1 MG tablet Take 1 mg by mouth at bedtime as needed for anxiety.    Yes Historical Provider, MD  aspirin 81 MG tablet Take 81 mg by mouth daily.   Yes Historical Provider, MD  cyclobenzaprine (FLEXERIL) 10 MG tablet Take 1 tablet (10 mg total) by mouth 3 (three) times daily as needed for muscle spasms. 11/12/15  Yes [name], MD  dicyclomine (BENTYL) 10 MG capsule TAKE 1 CAPSULE 3 TIMES DAILY. 07/30/15  Yes [name], NP  fluticasone (FLONASE) 50 MCG/ACT nasal spray INHALE 1 SPRAYS IN EACH NOSTRIL ONCE DAILY. 10/17/15  Yes [name], MD  Multiple Vitamin (MULITIVITAMIN WITH MINERALS) TABS Take 1 tablet by mouth daily.   Yes Historical Provider, MD  pantoprazole (PROTONIX) 40 MG tablet  TAKE (1) TABLET BY MOUTH TWICE A DAY BEFORE MEALS. (BREAKFAST AND SUPPER). 09/27/15  Yes [name], MD  tamsulosin (FLOMAX) 0.4 MG CAPS capsule Take 1 capsule by mouth daily. 07/13/13  Yes Historical Provider, MD  venlafaxine XR (EFFEXOR XR) 37.5 MG 24 hr capsule Take 1 capsule (37.5 mg total) by mouth daily with breakfast. 10/17/15  Yes [name], MD  Vitamin D, Ergocalciferol, (DRISDOL) 50000 UNITS CAPS capsule Take 50,000 Units by mouth every 7 (seven) days.   Yes Historical Provider, MD  zolpidem (AMBIEN CR) 12.5 MG CR tablet Take 1 tablet (12.5 mg total) by mouth at bedtime as needed for sleep. 10/17/15  Yes [name], MD     Family History  Problem Relation Age of Onset  . Breast cancer  Mother   . Kidney Stones Brother      Social History   Social History  . Marital Status: Divorced    Spouse Name: N/A  . Number of Children: 1  . Years of Education: college   Occupational History  . retired    Social History Main Topics  . Smoking status: Former Smoker -- 2.00 packs/day for 15 years    Types: Cigarettes    Quit date: 07/01/1983  . Smokeless tobacco: Never Used  . Alcohol Use: 0.0 oz/week    0 Standard drinks or equivalent per week     Comment: occasional  . Drug Use: No  . Sexual Activity: Not on file   Other Topics Concern  . Not on file   Social History Narrative     Review of Systems: General: negative for chills, fever, night sweats or weight changes.  Cardiovascular: negative for edema HEENT: negative for any visual disturbances, blindness, glaucoma Dermatological: negative for rash Respiratory: negative for cough, hemoptysis, or wheezing Urologic: negative for hematuria or dysuria, positive bilateral flank pain Abdominal: negative for nausea, vomiting, diarrhea, bright red blood per rectum, melena, or hematemesis Neurologic: negative for visual changes, syncope, or dizziness Musculoskeletal: negative for back pain, joint pain, or swelling Psych: cooperative and appropriate All other systems reviewed and are otherwise negative except as noted above.  Physical Exam: Blood pressure 139/79, pulse 94, temperature 98.8 F (37.1 C), temperature source Oral, resp. rate 20, height 5\' 10"  (1.778 m), weight 173 lb 11.6 oz (78.8 kg), SpO2 97 %.  General appearance: alert, cooperative, no distress and anxious Neck: no carotid bruit and no JVD Lungs: few crackles Lt base Heart: regular rate and rhythm Abdomen: soft, non-tender; bowel sounds normal; no masses,  no organomegaly and bilateral flank pain, tender to palpation Extremities: extremities normal, atraumatic, no cyanosis or edema Pulses: 2+ and symmetric Skin: Skin color, texture, turgor normal.  No rashes or lesions Neurologic: Grossly normal    Labs:   Results for orders placed or performed during the hospital encounter of 11/25/15 (from the past 24 hour(s))  Comprehensive metabolic panel     Status: Abnormal   Collection Time: 11/25/15 12:45 AM  Result Value Ref Range   Sodium 135 135 - 145 mmol/L   Potassium 3.2 (L) 3.5 - 5.1 mmol/L   Chloride 98 (L) 101 - 111 mmol/L   CO2 28 22 - 32 mmol/L   Glucose, Bld 127 (H) 65 - 99 mg/dL   BUN 9 6 - 20 mg/dL   Creatinine, Ser 0.85 0.61 - 1.24 mg/dL   Calcium 9.5 8.9 - 10.3 mg/dL   Total Protein 7.1 6.5 - 8.1 g/dL   Albumin 4.4 3.5 - 5.0  g/dL   AST 95 (H) 15 - 41 U/L   ALT 80 (H) 17 - 63 U/L   Alkaline Phosphatase 57 38 - 126 U/L   Total Bilirubin 1.1 0.3 - 1.2 mg/dL   GFR calc non Af Amer >60 >60 mL/min   GFR calc Af Amer >60 >60 mL/min   Anion gap 9 5 - 15  CBC with Differential     Status: None   Collection Time: 11/25/15 12:45 AM  Result Value Ref Range   WBC 5.0 4.0 - 10.5 K/uL   RBC 4.83 4.22 - 5.81 MIL/uL   Hemoglobin 15.8 13.0 - 17.0 g/dL   HCT 44.0 39.0 - 52.0 %   MCV 91.1 78.0 - 100.0 fL   MCH 32.7 26.0 - 34.0 pg   MCHC 35.9 30.0 - 36.0 g/dL   RDW 11.5 11.5 - 15.5 %   Platelets 208 150 - 400 K/uL   Neutrophils Relative % 73 %   Neutro Abs 3.6 1.7 - 7.7 K/uL   Lymphocytes Relative 14 %   Lymphs Abs 0.7 0.7 - 4.0 K/uL   Monocytes Relative 13 %   Monocytes Absolute 0.6 0.1 - 1.0 K/uL   Eosinophils Relative 0 %   Eosinophils Absolute 0.0 0.0 - 0.7 K/uL   Basophils Relative 0 %   Basophils Absolute 0.0 0.0 - 0.1 K/uL  Troponin I     Status: Abnormal   Collection Time: 11/25/15 12:45 AM  Result Value Ref Range   Troponin I 0.41 (H) <0.031 ng/mL  Urinalysis, Routine w reflex microscopic     Status: Abnormal   Collection Time: 11/25/15  1:36 AM  Result Value Ref Range   Color, Urine YELLOW YELLOW   APPearance CLEAR CLEAR   Specific Gravity, Urine <1.005 (L) 1.005 - 1.030   pH 6.0 5.0 - 8.0   Glucose, UA  NEGATIVE NEGATIVE mg/dL   Hgb urine dipstick NEGATIVE NEGATIVE   Bilirubin Urine NEGATIVE NEGATIVE   Ketones, ur TRACE (A) NEGATIVE mg/dL   Protein, ur NEGATIVE NEGATIVE mg/dL   Nitrite NEGATIVE NEGATIVE   Leukocytes, UA NEGATIVE NEGATIVE  Troponin I     Status: Abnormal   Collection Time: 11/25/15  4:23 AM  Result Value Ref Range   Troponin I 0.41 (H) <0.031 ng/mL  APTT     Status: Abnormal   Collection Time: 11/25/15  4:23 AM  Result Value Ref Range   aPTT >200 (HH) 24 - 37 seconds  Protime-INR     Status: None   Collection Time: 11/25/15  4:23 AM  Result Value Ref Range   Prothrombin Time 14.8 11.6 - 15.2 seconds   INR 1.14 0.00 - 1.49     Radiology/Studies: Dg Chest 2 View  11/12/2015  CLINICAL DATA:  Back pain. EXAM: CHEST  2 VIEW COMPARISON:  January 30, 2013. FINDINGS: The heart size and mediastinal contours are within normal limits. Both lungs are clear. No pneumothorax or pleural effusion is noted. The visualized skeletal structures are unremarkable. IMPRESSION: No active cardiopulmonary disease. Electronically Signed   By: Marijo Conception, M.D.   On: 11/12/2015 16:23   Dg Cervical Spine Complete  11/12/2015  CLINICAL DATA:  Intermittent cervicalgia for 1 year EXAM: CERVICAL SPINE - COMPLETE 4+ VIEW COMPARISON:  Cervical MRI May 13, 2014 FINDINGS: Frontal, lateral, open-mouth odontoid, and bilateral oblique views were obtained. There is no fracture or spondylolisthesis. Prevertebral soft tissues and predental space regions are normal. There is a moderately severe disc space narrowing  at C5-6. There is moderate disc space narrowing at C4-5 and C6-7. There is facet hypertrophy with exit foraminal narrowing at C5-6 bilaterally and at C6-7 on the right. There is calcification in the nuchal ligament posterior to C6. IMPRESSION: Multilevel osteoarthritic change.  No fracture or spondylolisthesis. Electronically Signed   By: Lowella Grip III M.D.   On: 11/12/2015 16:29   Dg  Epidurography  11/07/2015  CLINICAL DATA:  Thoracic disc disease T7-8, T8-9 and T9-10. Pain in the thoracic spine radiating around the right side of the chest and upper abdomen. EXAM: Thoracic EPIDURAL INJECTION DIAGNOSTIC EPIDURAL INJECTION THERAPEUTIC EPIDURAL INJECTION COMPARISON:  MRI 10/09/2015 PROCEDURE: Thoracic EPIDURAL INJECTION The overlying skin was scrubbed with Betadine and draped in sterile fashion. Skin anesthesia was carried out using 1% Lidocaine. An interlaminar approach was performed on the right at T8-9.A 20 gauge spinal epidural needle was advanced using loss-of-resistance technique. DIAGNOSTIC EPIDURAL INJECTION Injection of Isovue-300 shows a good epidural pattern with spread above and below the level of needle placement, spreading to both sides. No vascular opacification is seen. THERAPEUTIC EPIDURAL INJECTION 1.5 cc of Kenalog 40 mixed with 1 cc of 1% Lidocaine and 2 cc of normal saline were then instilled. The procedure was well-tolerated, and the patient was discharged thirty minutes following the injection in good condition. FLUOROSCOPY TIME:  2 minutes 47 seconds. 365.93 micro gray meter squared IMPRESSION: Technically successful initial thoracic epidural injection on the right at T8-9. Electronically Signed   By: Nelson Chimes M.D.   On: 11/07/2015 13:26   Dg Chest Port 1 View  11/25/2015  CLINICAL DATA:  Dyspnea. EXAM: PORTABLE CHEST 1 VIEW COMPARISON:  11/12/2015 FINDINGS: Lower lung volumes compared to prior exam. Heart at the upper limits normal in size likely accentuated by technique. Minimal left basilar atelectasis. No pulmonary edema, confluent airspace disease, large pleural effusion or pneumothorax. IMPRESSION: Lower lung volumes from exam 2 weeks prior. Accentuation of the cardiac size is likely secondary to technique. Left basilar atelectasis. Electronically Signed   By: Jeb Levering M.D.   On: 11/25/2015 05:41    EKG:NSR  ASSESSMENT AND PLAN:  Principal  Problem:   Chest pain with moderate risk of acute coronary syndrome Active Problems:   Elevated troponin   SOB (shortness of breath)   Uncontrolled hypertension   Bilateral flank pain   History of tobacco abuse   GERD (gastroesophageal reflux disease)   PUD (peptic ulcer disease)   DJD- spine injection 11/07/15   Nephrolithiasis   Anxiety   PLAN: Continue IV Heparin, cycle enzymes, check echo, K+ordered, he will probably need cath Tuesday- MD to see.   Consider urology referral after cardiac work up for bilateral flank pain with a history of non obstructive nephrolithiasis by CT in Sept 2016.    Angelena Form, PA-C 11/25/2015, 9:56 AM [phone]  The patient was seen and examined, and I agree with the history, physical exam, assessment and plan as documented above which has been discussed with L, Kilroy PA-C, with modifications as noted below. Pt developed sudden onset of chest pressure and shortness of breath last night. Normally able to walk 1 mile (to and from mailbox). Asked neighbor (nurse) to check BP, reportedly 165/125. Also c/o back pain and says he has bilateral kidney stones. Had some dysuria earlier but ok if he hydrates. Currently free of chest pain/SOB. Trops 0.41 x 2. ECG unremarkable.  Will continue ASA and add metoprolol 12.5 mg bid. Check lipid panel.  I will order  a 2-D echocardiogram with Doppler to evaluate cardiac structure, function, and regional wall motion. Have recommended coronary angiography to be performed 5/30. If he has CAD, he will f/u with me in our [place] office as per his preference.  Kate Sable, MD, [facility]  11/25/2015 10:09 AM

## 2015-11-25 NOTE — Progress Notes (Signed)
ANTICOAGULATION CONSULT NOTE - Preliminary  Pharmacy Consult for heparin Indication: chest pain/ACS  Allergies  Allergen Reactions  . Clams [Shellfish Allergy] Nausea And Vomiting  . Tetracyclines & Related Rash  . Tylenol [Acetaminophen] Rash    Patient Measurements: Height: 5\' 10"  (177.8 cm) Weight: 175 lb (79.379 kg) IBW/kg (Calculated) : 73 HEPARIN DW (KG): 79.4   Vital Signs: Temp: 98.2 F (36.8 C) (05/28 0033) Temp Source: Oral (05/28 0033) BP: 149/94 mmHg (05/28 0230) Pulse Rate: 96 (05/28 0230)  Labs:  Recent Labs  11/25/15 0045  HGB 15.8  HCT 44.0  PLT 208  CREATININE 0.85  TROPONINI 0.41*   Estimated Creatinine Clearance: 88.3 mL/min (by C-G formula based on Cr of 0.85).  Medical History: Past Medical History  Diagnosis Date  . IBS (irritable bowel syndrome)   . Glaucoma   . Leukopenia 10/23/2011    Decrease in lymphocytes dating back to 1995.  Marland Kitchen History of tobacco abuse 10/23/2011    History of smoking for 10 years, a pack per day, quitting at age 67.  . Bronchitis   . Renal calculi     Medications:   (Not in a hospital admission) Scheduled:  . potassium chloride SA  40 mEq Oral Once   Infusions:   PRN:  Anti-infectives    None      Assessment: 65 yo male presented to ED with elevated BP over past 2 days. No bleeding per RN. Troponin elevated 0.41.   Goal of Therapy:  Heparin level 0.3-0.7 units/ml   Plan:  Give 4000 units bolus x 1 Start heparin infusion at 950 units/hr Check anti-Xa level in 6 hours and daily while on heparin Continue to monitor H&H and platelets Preliminary review of pertinent patient information completed.  Forestine Na clinical pharmacist will complete review during morning rounds to assess the patient and finalize treatment regimen.  Nyra Capes, [place] 11/25/2015,4:03 AM

## 2015-11-25 NOTE — ED Provider Notes (Signed)
CSN: [id number]     Arrival date & time 11/25/15  0013 History   First MD Initiated Contact with Patient 11/25/15 0112     Chief Complaint  Patient presents with  . Hypertension     (Consider location/radiation/quality/duration/timing/severity/associated sxs/prior Treatment) Patient is a 65 y.o. male presenting with hypertension. The history is provided by the patient.  Hypertension  He states that he went for a brisk walk, 2 days ago and noted that his head was pounding and could intermittently hear his heartbeat in his years. He checked his blood pressure and it was elevated to-155/110. Since then, he continues having intermittent headaches and has checked his blood pressure multiple times. It has never gone down to normal and has been as high as 165/116. He denies visual change, nausea, vomiting. He denies chest pain but does have some dyspnea if he lays flat. Also, for the last 2 weeks, he has been having pain in the flank areas bilaterally. He had been seen in the ED for neck pain and prescribed cyclobenzaprine. He states he was having the flank pain at that time but was not addressed. He rates his pain at 5/10. He does not have a history of hypertension.  Past Medical History  Diagnosis Date  . IBS (irritable bowel syndrome)   . Glaucoma   . Leukopenia 10/23/2011    Decrease in lymphocytes dating back to 1995.  Marland Kitchen History of tobacco abuse 10/23/2011    History of smoking for 10 years, a pack per day, quitting at age 57.  . Bronchitis   . Renal calculi    Past Surgical History  Procedure Laterality Date  . Cystography    . Appendectomy    . Rotator cuff repair      bilateral shoulders  . Refractive surgery      "to relieve glaucoma"  . Colonoscopy N/A 09/01/2012    Procedure: COLONOSCOPY;  Surgeon: [name], MD;  Location: [facility];  Service: Endoscopy;  Laterality: N/A;  1030  . Esophagogastroduodenoscopy N/A 07/12/2015    Procedure: ESOPHAGOGASTRODUODENOSCOPY (EGD);   Surgeon: [name], MD;  Location: [facility];  Service: Endoscopy;  Laterality: N/A;  2:50 - moved to 1:00 - Ann to notify   Family History  Problem Relation Age of Onset  . Breast cancer Mother   . Kidney Stones Brother    Social History  Substance Use Topics  . Smoking status: Former Smoker -- 2.00 packs/day for 15 years    Types: Cigarettes    Quit date: 07/01/1983  . Smokeless tobacco: Never Used  . Alcohol Use: 0.0 oz/week    0 Standard drinks or equivalent per week     Comment: occasional    Review of Systems  All other systems reviewed and are negative.     Allergies  Clams; Tetracyclines & related; and Tylenol  Home Medications   Prior to Admission medications   Medication Sig Start Date End Date Taking? Authorizing Provider  ALPRAZolam Duanne Moron) 1 MG tablet Take 1 mg by mouth at bedtime as needed for anxiety.    Yes Historical Provider, MD  aspirin 81 MG tablet Take 81 mg by mouth daily.   Yes Historical Provider, MD  cyclobenzaprine (FLEXERIL) 10 MG tablet Take 1 tablet (10 mg total) by mouth 3 (three) times daily as needed for muscle spasms. 11/12/15  Yes [name], MD  dicyclomine (BENTYL) 10 MG capsule TAKE 1 CAPSULE 3 TIMES DAILY. 07/30/15  Yes Butch Penny, NP  fluticasone (FLONASE) 50 MCG/ACT nasal spray INHALE 1 SPRAYS IN EACH NOSTRIL ONCE DAILY. 10/17/15  Yes [name], MD  Multiple Vitamin (MULITIVITAMIN WITH MINERALS) TABS Take 1 tablet by mouth daily.   Yes Historical Provider, MD  pantoprazole (PROTONIX) 40 MG tablet TAKE (1) TABLET BY MOUTH TWICE A DAY BEFORE MEALS. (BREAKFAST AND SUPPER). 09/27/15  Yes [name], MD  tamsulosin (FLOMAX) 0.4 MG CAPS capsule Take 1 capsule by mouth daily. 07/13/13  Yes Historical Provider, MD  venlafaxine XR (EFFEXOR XR) 37.5 MG 24 hr capsule Take 1 capsule (37.5 mg total) by mouth daily with breakfast. 10/17/15  Yes [name], MD  Vitamin D, Ergocalciferol, (DRISDOL) 50000 UNITS CAPS capsule Take 50,000  Units by mouth every 7 (seven) days.   Yes Historical Provider, MD  zolpidem (AMBIEN CR) 12.5 MG CR tablet Take 1 tablet (12.5 mg total) by mouth at bedtime as needed for sleep. 10/17/15  Yes [name], MD   BP 152/105 mmHg  Pulse 102  Temp(Src) 98.2 F (36.8 C) (Oral)  Resp 18  Ht 5\' 10"  (1.778 m)  Wt 175 lb (79.379 kg)  BMI 25.11 kg/m2  SpO2 96% Physical Exam  Nursing note and vitals reviewed.  65 year old male, resting comfortably and in no acute distress. Vital signs are significant for hypertension and tachycardia. Oxygen saturation is 97%, which is normal. Head is normocephalic and atraumatic. PERRLA, EOMI. Oropharynx is clear. Neck is nontender and supple without adenopathy or JVD. Back is nontender in the midline. There is bilateral CVA tenderness, worse on the right. Lungs are clear without rales, wheezes, or rhonchi. Chest is nontender. Heart has regular rate and rhythm without murmur. Abdomen is soft, flat, nontender without masses or hepatosplenomegaly and peristalsis is normoactive. Extremities have no cyanosis or edema, full range of motion is present. Skin is warm and dry without rash. Neurologic: Mental status is normal, cranial nerves are intact, there are no motor or sensory deficits.  ED Course  Procedures (including critical care time) Labs Review Results for orders placed or performed during the hospital encounter of 11/25/15  Comprehensive metabolic panel  Result Value Ref Range   Sodium 135 135 - 145 mmol/L   Potassium 3.2 (L) 3.5 - 5.1 mmol/L   Chloride 98 (L) 101 - 111 mmol/L   CO2 28 22 - 32 mmol/L   Glucose, Bld 127 (H) 65 - 99 mg/dL   BUN 9 6 - 20 mg/dL   Creatinine, Ser 0.85 0.61 - 1.24 mg/dL   Calcium 9.5 8.9 - 10.3 mg/dL   Total Protein 7.1 6.5 - 8.1 g/dL   Albumin 4.4 3.5 - 5.0 g/dL   AST 95 (H) 15 - 41 U/L   ALT 80 (H) 17 - 63 U/L   Alkaline Phosphatase 57 38 - 126 U/L   Total Bilirubin 1.1 0.3 - 1.2 mg/dL   GFR calc non Af Amer >60 >60  mL/min   GFR calc Af Amer >60 >60 mL/min   Anion gap 9 5 - 15  CBC with Differential  Result Value Ref Range   WBC 5.0 4.0 - 10.5 K/uL   RBC 4.83 4.22 - 5.81 MIL/uL   Hemoglobin 15.8 13.0 - 17.0 g/dL   HCT 44.0 39.0 - 52.0 %   MCV 91.1 78.0 - 100.0 fL   MCH 32.7 26.0 - 34.0 pg   MCHC 35.9 30.0 - 36.0 g/dL   RDW 11.5 11.5 - 15.5 %   Platelets 208 150 - 400 K/uL  Neutrophils Relative % 73 %   Neutro Abs 3.6 1.7 - 7.7 K/uL   Lymphocytes Relative 14 %   Lymphs Abs 0.7 0.7 - 4.0 K/uL   Monocytes Relative 13 %   Monocytes Absolute 0.6 0.1 - 1.0 K/uL   Eosinophils Relative 0 %   Eosinophils Absolute 0.0 0.0 - 0.7 K/uL   Basophils Relative 0 %   Basophils Absolute 0.0 0.0 - 0.1 K/uL  Urinalysis, Routine w reflex microscopic  Result Value Ref Range   Color, Urine YELLOW YELLOW   APPearance CLEAR CLEAR   Specific Gravity, Urine <1.005 (L) 1.005 - 1.030   pH 6.0 5.0 - 8.0   Glucose, UA NEGATIVE NEGATIVE mg/dL   Hgb urine dipstick NEGATIVE NEGATIVE   Bilirubin Urine NEGATIVE NEGATIVE   Ketones, ur TRACE (A) NEGATIVE mg/dL   Protein, ur NEGATIVE NEGATIVE mg/dL   Nitrite NEGATIVE NEGATIVE   Leukocytes, UA NEGATIVE NEGATIVE  Troponin I  Result Value Ref Range   Troponin I 0.41 (H) <0.031 ng/mL  Troponin I  Result Value Ref Range   Troponin I 0.41 (H) <0.031 ng/mL  APTT  Result Value Ref Range   aPTT >200 (HH) 24 - 37 seconds  Protime-INR  Result Value Ref Range   Prothrombin Time 14.8 11.6 - 15.2 seconds   INR 1.14 0.00 - 1.49   Imaging Review Dg Chest Port 1 View  11/25/2015  CLINICAL DATA:  Dyspnea. EXAM: PORTABLE CHEST 1 VIEW COMPARISON:  11/12/2015 FINDINGS: Lower lung volumes compared to prior exam. Heart at the upper limits normal in size likely accentuated by technique. Minimal left basilar atelectasis. No pulmonary edema, confluent airspace disease, large pleural effusion or pneumothorax. IMPRESSION: Lower lung volumes from exam 2 weeks prior. Accentuation of the  cardiac size is likely secondary to technique. Left basilar atelectasis. Electronically Signed   By: Jeb Levering M.D.   On: 11/25/2015 05:41   I have personally reviewed and evaluated these images and lab results as part of my medical decision-making.   EKG Interpretation   Date/Time:  Sunday Nov 25 2015 01:34:28 EDT Ventricular Rate:  99 PR Interval:  185 QRS Duration: 89 QT Interval:  342 QTC Calculation: 439 R Axis:   58 Text Interpretation:  Sinus rhythm Normal ECG When compared with ECG of  06/06/2015, No significant change was found Confirmed by [facility]  MD, Sanela Evola  ([id number]) on 11/25/2015 1:42:01 AM      MDM   Final diagnoses:  Acute coronary syndrome (HCC)  Hypokalemia  Elevated blood pressure    Hyperetention. Old records are reviewed and he does have an ED visit on May 15 for neck pain and blood pressure is mildly elevated at time. However, majority of office visits and ED visits had normal blood pressure. He has had occasional mild blood pressure elevations recorded. Blood pressure is not high enough to account for his other symptoms. Will check ECG and troponin given the history of dyspnea. Will check urinalysis and metabolic panel. I do not see an indication for initiating antihypertensive treatment today - I anticipate referral back to PCP for blood pressure management.  ECG does not show any acute changes, but troponin has come back significantly elevated. He is given aspirin and started on heparin. Remainder of workup is significant only for a mild hypokalemia and is given oral potassium. Case is discussed with Dr. Lamona Curl of cardiology service who agrees to accept the patient and transferred to [facility]     Delora Fuel, MD  11/25/15 0808 

## 2015-11-25 NOTE — ED Notes (Signed)
Pt reports blood pressure being elevated. Pt states he was out walking today & got hot. Pt states head was hurting the reason he checked his blood pressure.

## 2015-11-25 NOTE — ED Notes (Signed)
[facility] called for truck to transport to [facility],

## 2015-11-25 NOTE — Progress Notes (Signed)
ANTICOAGULATION CONSULT NOTE - Preliminary  Pharmacy Consult for heparin Indication: chest pain/ACS  Allergies  Allergen Reactions  . Clams [Shellfish Allergy] Nausea And Vomiting  . Tetracyclines & Related Rash  . Tylenol [Acetaminophen] Rash   Patient Measurements: Height: 5\' 10"  (177.8 cm) Weight: 173 lb 11.6 oz (78.8 kg) (bed scale ) IBW/kg (Calculated) : 73 HEPARIN DW (KG): 78.8  Labs:  Recent Labs  11/25/15 0045 11/25/15 0423 11/25/15 1015 11/25/15 1228  HGB 15.8  --   --   --   HCT 44.0  --   --   --   PLT 208  --   --   --   APTT  --  >200*  --   --   LABPROT  --  14.8  --   --   INR  --  1.14  --   --   HEPARINUNFRC  --   --   --  0.32  CREATININE 0.85  --   --   --   TROPONINI 0.41* 0.41* 0.33*  --    Estimated Creatinine Clearance: 88.3 mL/min (by C-G formula based on Cr of 0.85).  Scheduled:  . [START ON 11/26/2015] aspirin EC  81 mg Oral Daily  . atorvastatin  40 mg Oral q1800  . [START ON 11/26/2015] fluticasone  1 spray Each Nare Daily  . metoprolol tartrate  12.5 mg Oral BID  . [START ON 11/26/2015] multivitamin with minerals  1 tablet Oral Daily  . pantoprazole  40 mg Oral BID AC  . [START ON 11/26/2015] pneumococcal 23 valent vaccine  0.5 mL Intramuscular Tomorrow-1000  . [START ON 11/26/2015] tamsulosin  0.4 mg Oral Daily  . [START ON 11/26/2015] venlafaxine XR  37.5 mg Oral Q breakfast   Infusions:  . heparin 950 Units/hr (11/25/15 0417)   Assessment: 65 yo male presented to [facility] ED with elevated BP over past 2 days. Transferred to [facility], plan for Coronary angiography to be performed 5/30 Tuesday. Troponin elevated 0.41.   HL 0.32 (therapeutice), Hgb 15.8, plt wnl  Goal of Therapy:  Heparin level 0.3-0.7 units/ml   Plan:  Increase heparin slightly to 1000 units/hr, to ensure pt remains in therapeutic range Confirm anti-Xa level in 6 hours and daily while on heparin Continue to monitor H&H and platelets  Darl Pikes, PharmD Clinical  Pharmacist- Resident Pager: [place], [facility] 11/25/2015,1:22 PM

## 2015-11-25 NOTE — ED Notes (Signed)
CRITICAL VALUE ALERT  Critical value received:  PTT >200  Date of notification:  11/25/15  Time of notification:  [id number]  Critical value read back:Yes.    Nurse who received alert:  Derek Mound, RN  MD notified (1st page):  Roxanne Mins  Time of first page:  [phone]  MD notified (2nd page):  Time of second page:  Responding MD:  Roxanne Mins  Time MD responded:  [phone]

## 2015-11-26 DIAGNOSIS — I214 Non-ST elevation (NSTEMI) myocardial infarction: Secondary | ICD-10-CM | POA: Diagnosis not present

## 2015-11-26 LAB — HEPARIN LEVEL (UNFRACTIONATED): Heparin Unfractionated: 0.34 IU/mL (ref 0.30–0.70)

## 2015-11-26 LAB — LIPID PANEL
Cholesterol: 146 mg/dL (ref 0–200)
HDL: 49 mg/dL (ref >40)
LDL Cholesterol: 81 mg/dL (ref 0–99)
Total CHOL/HDL Ratio: 3 RATIO
Triglycerides: 82 mg/dL (ref <150)
VLDL: 16 mg/dL (ref 0–40)

## 2015-11-26 LAB — BASIC METABOLIC PANEL
Anion gap: 7 (ref 5–15)
BUN: 16 mg/dL (ref 6–20)
CO2: 27 mmol/L (ref 22–32)
Calcium: 9.2 mg/dL (ref 8.9–10.3)
Chloride: 106 mmol/L (ref 101–111)
Creatinine, Ser: 1.25 mg/dL — ABNORMAL HIGH (ref 0.61–1.24)
GFR calc Af Amer: >60 mL/min (ref >60)
GFR calc non Af Amer: 58 mL/min — ABNORMAL LOW (ref >60)
Glucose, Bld: 109 mg/dL — ABNORMAL HIGH (ref 65–99)
Potassium: 4.3 mmol/L (ref 3.5–5.1)
Sodium: 140 mmol/L (ref 135–145)

## 2015-11-26 LAB — CBC
HCT: 42 % (ref 39.0–52.0)
HEMOGLOBIN: 14.1 g/dL (ref 13.0–17.0)
MCH: 31.3 pg (ref 26.0–34.0)
MCHC: 33.6 g/dL (ref 30.0–36.0)
MCV: 93.3 fL (ref 78.0–100.0)
Platelets: 184 10*3/uL (ref 150–400)
RBC: 4.5 MIL/uL (ref 4.22–5.81)
RDW: 12.2 % (ref 11.5–15.5)
WBC: 5 10*3/uL (ref 4.0–10.5)

## 2015-11-26 LAB — TSH: TSH: 1.291 u[IU]/mL (ref 0.350–4.500)

## 2015-11-26 MED ORDER — SODIUM CHLORIDE 0.9 % WEIGHT BASED INFUSION
3.0000 mL/kg/h | INTRAVENOUS | Status: DC
  Administered 2015-11-27: 3 mL/kg/h via INTRAVENOUS

## 2015-11-26 MED ORDER — SODIUM CHLORIDE 0.9 % IV SOLN
250.0000 mL | INTRAVENOUS | Status: DC | PRN
Start: 2015-11-26 — End: 2015-11-27

## 2015-11-26 MED ORDER — SODIUM CHLORIDE 0.9% FLUSH: 3.0000 mL | INTRAVENOUS | Status: DC | PRN

## 2015-11-26 MED ORDER — ASPIRIN 81 MG PO CHEW
81.0000 mg | CHEWABLE_TABLET | ORAL | Status: AC
  Administered 2015-11-27: 81 mg via ORAL
  Filled 2015-11-26: qty 1

## 2015-11-26 MED ORDER — SODIUM CHLORIDE 0.9% FLUSH
3.0000 mL | Freq: Two times a day (BID) | INTRAVENOUS | Status: DC
  Administered 2015-11-26 – 2015-11-27 (×3): 3 mL via INTRAVENOUS

## 2015-11-26 MED ORDER — SODIUM CHLORIDE 0.9 % WEIGHT BASED INFUSION: 1.0000 mL/kg/h | INTRAVENOUS | Status: DC

## 2015-11-26 NOTE — Progress Notes (Signed)
Patient Name: Joel Greene      SUBJECTIVE: Admitted 5/ 28 with chest pain significantly elevated blood pressure and positive troponins (0.4)  scheduled for catheterization tomorrow . Echocardiogram pending  Great night   Past Medical History  Diagnosis Date  . IBS (irritable bowel syndrome)   . Glaucoma   . Leukopenia 10/23/2011    Decrease in lymphocytes dating back to 1995.  Marland Kitchen History of tobacco abuse 10/23/2011    History of smoking for 10 years, a pack per day, quitting at age 36.  . Bronchitis   . Renal calculi   . PUD (peptic ulcer disease) Jan 2017    endoscopy  . Sleep apnea     on C-pap  . DJD (degenerative joint disease)     Scheduled Meds:  Scheduled Meds: . aspirin EC  81 mg Oral Daily  . atorvastatin  40 mg Oral q1800  . metoprolol tartrate  12.5 mg Oral BID  . multivitamin with minerals  1 tablet Oral Daily  . pantoprazole  40 mg Oral BID AC  . pneumococcal 23 valent vaccine  0.5 mL Intramuscular Tomorrow-1000  . tamsulosin  0.4 mg Oral Daily  . venlafaxine XR  37.5 mg Oral Q breakfast   Continuous Infusions: . heparin 1,000 Units/hr (11/25/15 2325)   ALPRAZolam, cyclobenzaprine, fluticasone, nitroGLYCERIN, ondansetron (ZOFRAN) IV    PHYSICAL EXAM Filed Vitals:   11/25/15 2100 11/25/15 2216 11/26/15 0000 11/26/15 0658  BP: 107/65  110/73 96/67  Pulse: 91  82 101  Temp: 100.3 F (37.9 C) 99.6 F (37.6 C) 99 F (37.2 C) 98.2 F (36.8 C)  TempSrc: Oral Oral Oral Oral  Resp: 20  18 18   Height:      Weight:    175 lb 4.8 oz (79.516 kg)  SpO2: 97%  95% 96%    Well developed and nourished in no acute distress HENT normal Neck supple with JVP-flat Clear Regular rate and rhythm, no murmurs or gallops Abd-soft with active BS No Clubbing cyanosis edema Skin-warm and dry A & Oriented  Grossly normal sensory and motor function   TELEMETRY: Reviewed telemetry pt in sinus with rates 70--120:    Intake/Output Summary (Last 24  hours) at 11/26/15 0938 Last data filed at 11/26/15 0600  Gross per 24 hour  Intake 911.89 ml  Output   1100 ml  Net -188.11 ml    LABS: Basic Metabolic Panel:  Recent Labs Lab 11/25/15 0045 11/26/15 0330  NA 135 140  K 3.2* 4.3  CL 98* 106  CO2 28 27  GLUCOSE 127* 109*  BUN 9 16  CREATININE 0.85 1.25*  CALCIUM 9.5 9.2   Cardiac Enzymes:  Recent Labs  11/25/15 0423 11/25/15 1015 11/25/15 1637  TROPONINI 0.41* 0.33* 0.29*   CBC:  Recent Labs Lab 11/25/15 0045 11/26/15 0330  WBC 5.0 5.0  NEUTROABS 3.6  --   HGB 15.8 14.1  HCT 44.0 42.0  MCV 91.1 93.3  PLT 208 184   PROTIME:  Recent Labs  11/25/15 0423  LABPROT 14.8  INR 1.14   Liver Function Tests:  Recent Labs  11/25/15 0045  AST 95*  ALT 80*  ALKPHOS 57  BILITOT 1.1  PROT 7.1  ALBUMIN 4.4   No results for input(s): LIPASE, AMYLASE in the last 72 hours. BNP: BNP (last 3 results) No results for input(s): BNP in the last 8760 hours.  ProBNP (last 3 results) No results for input(s): PROBNP in  the last 8760 hours.  D-Dimer: No results for input(s): DDIMER in the last 72 hours. Hemoglobin A1C: No results for input(s): HGBA1C in the last 72 hours. Fasting Lipid Panel:  Recent Labs  11/26/15 0330  CHOL 146  HDL 49  LDLCALC 81  TRIG 82  CHOLHDL 3.0       ASSESSMENT AND PLAN:  Principal Problem:   Chest pain with moderate risk of acute coronary syndrome Active Problems:   History of tobacco abuse   GERD (gastroesophageal reflux disease)   Elevated troponin   SOB (shortness of breath)   PUD (peptic ulcer disease)   Uncontrolled hypertension   DJD- spine injection 11/07/15   Bilateral flank pain   Nephrolithiasis   Anxiety  For cath in am HR varialbe and fast will check TSH Bump in Cr  iwll recheck in am  Signed, Virl Axe MD  11/26/2015

## 2015-11-26 NOTE — Progress Notes (Signed)
Pt saw the video regarding cardiac cath, and stent.

## 2015-11-26 NOTE — Progress Notes (Signed)
ANTICOAGULATION CONSULT NOTE - Follow Up Consult  Pharmacy Consult for Heparin Indication: chest pain/ACS  Allergies  Allergen Reactions  . Clams [Shellfish Allergy] Nausea And Vomiting  . Tetracyclines & Related Rash  . Tylenol [Acetaminophen] Rash    Patient Measurements: Height: 5\' 10"  (177.8 cm) Weight: 175 lb 4.8 oz (79.516 kg) IBW/kg (Calculated) : 73 Heparin Dosing Weight: 78.8 kg  Vital Signs: Temp: 98.2 F (36.8 C) (05/29 0658) Temp Source: Oral (05/29 0658) BP: 96/67 mmHg (05/29 0658) Pulse Rate: 101 (05/29 0658)  Labs:  Recent Labs  11/25/15 0045 11/25/15 0423 11/25/15 1015 11/25/15 1228 11/25/15 1637 11/25/15 1954 11/26/15 0330  HGB 15.8  --   --   --   --   --  14.1  HCT 44.0  --   --   --   --   --  42.0  PLT 208  --   --   --   --   --  184  APTT  --  >200*  --   --   --   --   --   LABPROT  --  14.8  --   --   --   --   --   INR  --  1.14  --   --   --   --   --   HEPARINUNFRC  --   --   --  0.32  --  0.34 0.34  CREATININE 0.85  --   --   --   --   --  1.25*  TROPONINI 0.41* 0.41* 0.33*  --  0.29*  --   --     Estimated Creatinine Clearance: 60 mL/min (by C-G formula based on Cr of 1.25).    Assessment: 65 yo male presented to [facility] ED with elevated BP over past 2 days. Transferred to [facility], plan for coronary angiography to be performed 5/30 Tuesday.   - HL 0.34 (therapeutic), hgb 14.1, plt wnl  Goal of Therapy:  Heparin level 0.3-0.7 units/ml Monitor platelets by anticoagulation protocol: Yes   Plan:  Continue IV heparin at 1000 units/hr Daily HL and CBC   Darl Pikes, PharmD Clinical Pharmacist- Resident Pager: [phone]  Darl Pikes 11/26/2015,7:35 AM

## 2015-11-27 ENCOUNTER — Other Ambulatory Visit (HOSPITAL_COMMUNITY): Payer: Medicare Other

## 2015-11-27 ENCOUNTER — Encounter (HOSPITAL_COMMUNITY)
Admission: EM | Disposition: A | Discharge status: Home / Self Care | Payer: Self-pay | Source: Home / Self Care | Attending: Cardiovascular Disease

## 2015-11-27 ENCOUNTER — Encounter (HOSPITAL_COMMUNITY): Payer: Self-pay | Admitting: Interventional Cardiology

## 2015-11-27 DIAGNOSIS — N200 Calculus of kidney: Secondary | ICD-10-CM

## 2015-11-27 DIAGNOSIS — M549 Dorsalgia, unspecified: Secondary | ICD-10-CM

## 2015-11-27 DIAGNOSIS — I214 Non-ST elevation (NSTEMI) myocardial infarction: Secondary | ICD-10-CM

## 2015-11-27 DIAGNOSIS — I249 Acute ischemic heart disease, unspecified: Secondary | ICD-10-CM | POA: Insufficient documentation

## 2015-11-27 HISTORY — PX: CARDIAC CATHETERIZATION: SHX172

## 2015-11-27 LAB — BASIC METABOLIC PANEL
ANION GAP: 5 (ref 5–15)
BUN: 18 mg/dL (ref 6–20)
CALCIUM: 9.3 mg/dL (ref 8.9–10.3)
CO2: 26 mmol/L (ref 22–32)
CREATININE: 1.05 mg/dL (ref 0.61–1.24)
Chloride: 107 mmol/L (ref 101–111)
GFR calc Af Amer: >60 mL/min (ref >60)
GFR calc non Af Amer: >60 mL/min (ref >60)
GLUCOSE: 106 mg/dL — AB (ref 65–99)
Potassium: 4.2 mmol/L (ref 3.5–5.1)
Sodium: 138 mmol/L (ref 135–145)

## 2015-11-27 LAB — CBC
HCT: 39.1 % (ref 39.0–52.0)
Hemoglobin: 13.3 g/dL (ref 13.0–17.0)
MCH: 32 pg (ref 26.0–34.0)
MCHC: 34 g/dL (ref 30.0–36.0)
MCV: 94.2 fL (ref 78.0–100.0)
PLATELETS: 177 10*3/uL (ref 150–400)
RBC: 4.15 MIL/uL — ABNORMAL LOW (ref 4.22–5.81)
RDW: 12 % (ref 11.5–15.5)
WBC: 4.7 10*3/uL (ref 4.0–10.5)

## 2015-11-27 LAB — HEPARIN LEVEL (UNFRACTIONATED)
Heparin Unfractionated: 0.29 IU/mL — ABNORMAL LOW (ref 0.30–0.70)
Heparin Unfractionated: 0.54 IU/mL (ref 0.30–0.70)

## 2015-11-27 SURGERY — LEFT HEART CATH AND CORONARY ANGIOGRAPHY
Anesthesia: LOCAL

## 2015-11-27 MED ORDER — SODIUM CHLORIDE 0.9 % WEIGHT BASED INFUSION
3.0000 mL/kg/h | INTRAVENOUS | Status: DC
  Administered 2015-11-27: 3 mL/kg/h via INTRAVENOUS

## 2015-11-27 MED ORDER — SODIUM CHLORIDE 0.9 % IV SOLN: 250.0000 mL | INTRAVENOUS | Status: DC | PRN

## 2015-11-27 MED ORDER — MIDAZOLAM HCL 2 MG/2ML IJ SOLN
INTRAMUSCULAR | Status: DC | PRN
  Administered 2015-11-27 (×2): 1 mg via INTRAVENOUS

## 2015-11-27 MED ORDER — ACETAMINOPHEN 325 MG PO TABS: 650.0000 mg | ORAL_TABLET | ORAL | Status: DC | PRN

## 2015-11-27 MED ORDER — FENTANYL CITRATE (PF) 100 MCG/2ML IJ SOLN
INTRAMUSCULAR | Status: DC | PRN
  Administered 2015-11-27 (×2): 50 ug via INTRAVENOUS

## 2015-11-27 MED ORDER — LIDOCAINE HCL (PF) 1 % IJ SOLN
INTRAMUSCULAR | Status: AC
  Filled 2015-11-27: qty 30

## 2015-11-27 MED ORDER — HEPARIN (PORCINE) IN NACL 2-0.9 UNIT/ML-% IJ SOLN
INTRAMUSCULAR | Status: DC | PRN
  Administered 2015-11-27: 10 mL via INTRA_ARTERIAL

## 2015-11-27 MED ORDER — IOPAMIDOL (ISOVUE-370) INJECTION 76%
INTRAVENOUS | Status: DC | PRN
  Administered 2015-11-27: 70 mL via INTRA_ARTERIAL

## 2015-11-27 MED ORDER — IOPAMIDOL (ISOVUE-370) INJECTION 76%
INTRAVENOUS | Status: AC
  Filled 2015-11-27: qty 100

## 2015-11-27 MED ORDER — HEPARIN SODIUM (PORCINE) 1000 UNIT/ML IJ SOLN
INTRAMUSCULAR | Status: AC
  Filled 2015-11-27: qty 1

## 2015-11-27 MED ORDER — FENTANYL CITRATE (PF) 100 MCG/2ML IJ SOLN
INTRAMUSCULAR | Status: AC
  Filled 2015-11-27: qty 2

## 2015-11-27 MED ORDER — HEPARIN (PORCINE) IN NACL 2-0.9 UNIT/ML-% IJ SOLN
INTRAMUSCULAR | Status: DC | PRN
  Administered 2015-11-27: 1000 mL

## 2015-11-27 MED ORDER — NITROGLYCERIN 1 MG/10 ML FOR IR/CATH LAB
INTRA_ARTERIAL | Status: AC
  Filled 2015-11-27: qty 10

## 2015-11-27 MED ORDER — MIDAZOLAM HCL 2 MG/2ML IJ SOLN
INTRAMUSCULAR | Status: AC
  Filled 2015-11-27: qty 2

## 2015-11-27 MED ORDER — ONDANSETRON HCL 4 MG/2ML IJ SOLN: 4.0000 mg | Freq: Four times a day (QID) | INTRAMUSCULAR | Status: DC | PRN

## 2015-11-27 MED ORDER — VERAPAMIL HCL 2.5 MG/ML IV SOLN
INTRAVENOUS | Status: AC
  Filled 2015-11-27: qty 2

## 2015-11-27 MED ORDER — HEPARIN (PORCINE) IN NACL 2-0.9 UNIT/ML-% IJ SOLN
INTRAMUSCULAR | Status: AC
  Filled 2015-11-27: qty 1000

## 2015-11-27 MED ORDER — LIDOCAINE HCL (PF) 1 % IJ SOLN
INTRAMUSCULAR | Status: DC | PRN
  Administered 2015-11-27: 5 mL

## 2015-11-27 MED ORDER — NITROGLYCERIN 1 MG/10 ML FOR IR/CATH LAB
INTRA_ARTERIAL | Status: DC | PRN
  Administered 2015-11-27: 200 ug via INTRACORONARY

## 2015-11-27 MED ORDER — SODIUM CHLORIDE 0.9% FLUSH: 3.0000 mL | INTRAVENOUS | Status: DC | PRN

## 2015-11-27 MED ORDER — SODIUM CHLORIDE 0.9% FLUSH
3.0000 mL | Freq: Two times a day (BID) | INTRAVENOUS | Status: DC
  Administered 2015-11-27: 3 mL via INTRAVENOUS

## 2015-11-27 MED ORDER — HEPARIN SODIUM (PORCINE) 1000 UNIT/ML IJ SOLN
INTRAMUSCULAR | Status: DC | PRN
  Administered 2015-11-27: 4000 [IU] via INTRAVENOUS

## 2015-11-27 SURGICAL SUPPLY — 9 items
CATH INFINITI 5 FR JL3.5 (CATHETERS) ×2 IMPLANT
CATH INFINITI JR4 5F (CATHETERS) ×2 IMPLANT
DEVICE RAD COMP TR BAND LRG (VASCULAR PRODUCTS) ×1 IMPLANT
GLIDESHEATH SLEND A-KIT 6F 22G (SHEATH) ×1 IMPLANT
KIT HEART LEFT (KITS) ×2 IMPLANT
PACK CARDIAC CATHETERIZATION (CUSTOM PROCEDURE TRAY) ×2 IMPLANT
TRANSDUCER W/STOPCOCK (MISCELLANEOUS) ×2 IMPLANT
TUBING CIL FLEX 10 FLL-RA (TUBING) ×2 IMPLANT
WIRE SAFE-T 1.5MM-J .035X260CM (WIRE) ×2 IMPLANT

## 2015-11-27 NOTE — Progress Notes (Signed)
1752 TR band removed . Kept r arm elevated above heart

## 2015-11-27 NOTE — Progress Notes (Signed)
Patient Name: NORTHERN CORLETO Date of Encounter: 11/27/2015  Hospital Problem List     Principal Problem:   NSTEMI (non-ST elevated myocardial infarction) [facility]) Active Problems:   Chest pain with moderate risk of acute coronary syndrome   Elevated troponin   SOB (shortness of breath)   History of tobacco abuse   GERD (gastroesophageal reflux disease)   Uncontrolled hypertension   Nephrolithiasis   CVA tenderness   PUD (peptic ulcer disease)   DJD- spine injection 11/07/15   Anxiety    Subjective   No chest pain or sob overnight.  He is very concerned about bilat CVA tenderness, R>L.  This has been going on for at least 6 months and he is known to have bilateral renal stones (<61mm R>L - CT 03/2015).  He also says that a few wks ago, he had very dark urine, followed by the passing of clots with subsequent clearing of urine.  He hasn't seen blood or clots in his urine in about 2 wks.  Urine has been clear recently but says it was darker this AM. On for cath today - somewhat anxious.  Inpatient Medications    . aspirin EC  81 mg Oral Daily  . atorvastatin  40 mg Oral q1800  . metoprolol tartrate  12.5 mg Oral BID  . multivitamin with minerals  1 tablet Oral Daily  . pantoprazole  40 mg Oral BID AC  . sodium chloride flush  3 mL Intravenous Q12H  . tamsulosin  0.4 mg Oral Daily  . venlafaxine XR  37.5 mg Oral Q breakfast    Vital Signs    Filed Vitals:   11/26/15 1827 11/26/15 2106 11/27/15 0021 11/27/15 0712  BP:  126/70  102/65  Pulse:  89 90 54  Temp: 98.8 F (37.1 C) 98.2 F (36.8 C)  98.5 F (36.9 C)  TempSrc: Oral Oral  Oral  Resp: 18 17 17 16   Height:      Weight:    174 lb 9.6 oz (79.198 kg)  SpO2:  98% 98% 98%    Intake/Output Summary (Last 24 hours) at 11/27/15 0956 Last data filed at 11/27/15 0859  Gross per 24 hour  Intake    240 ml  Output   1920 ml  Net  -1680 ml   Filed Weights   11/25/15 0905 11/26/15 0658 11/27/15 0712  Weight: 173 lb 11.6 oz  (78.8 kg) 175 lb 4.8 oz (79.516 kg) 174 lb 9.6 oz (79.198 kg)    Physical Exam    General: Pleasant, NAD. Neuro: Alert and oriented X 3. Moves all extremities spontaneously. Psych: Normal affect. HEENT:  Normal  Neck: Supple without bruits or JVD. Lungs:  Resp regular and unlabored, CTA. Heart: RRR no s3, s4, or murmurs. Abdomen: Soft, non-tender, non-distended, BS + x 4. R>L CVA Tenderness. Extremities: No clubbing, cyanosis or edema. DP/PT/Radials 2+ and equal bilaterally.  Labs    CBC  Recent Labs  11/25/15 0045 11/26/15 0330 11/27/15 0325  WBC 5.0 5.0 4.7  NEUTROABS 3.6  --   --   HGB 15.8 14.1 13.3  HCT 44.0 42.0 39.1  MCV 91.1 93.3 94.2  PLT 208 184 [id number]   Basic Metabolic Panel  Recent Labs  11/25/15 0045 11/26/15 0330  NA 135 140  K 3.2* 4.3  CL 98* 106  CO2 28 27  GLUCOSE 127* 109*  BUN 9 16  CREATININE 0.85 1.25*  CALCIUM 9.5 9.2   Liver Function Tests  Recent  Labs  11/25/15 0045  AST 95*  ALT 80*  ALKPHOS 57  BILITOT 1.1  PROT 7.1  ALBUMIN 4.4   Cardiac Enzymes  Recent Labs  11/25/15 0423 11/25/15 1015 11/25/15 1637  TROPONINI 0.41* 0.33* 0.29*   Fasting Lipid Panel  Recent Labs  11/26/15 0330  CHOL 146  HDL 49  LDLCALC 81  TRIG 82  CHOLHDL 3.0   Thyroid Function Tests  Recent Labs  11/26/15 1044  TSH 1.291    Telemetry    RSR/Sinus Tachycardia.  Radiology    Dg Chest 2 View  11/12/2015  CLINICAL DATA:  Back pain. EXAM: CHEST  2 VIEW COMPARISON:  January 30, 2013. FINDINGS: The heart size and mediastinal contours are within normal limits. Both lungs are clear. No pneumothorax or pleural effusion is noted. The visualized skeletal structures are unremarkable. IMPRESSION: No active cardiopulmonary disease. Electronically Signed   By: Marijo Conception, M.D.   On: 11/12/2015 16:23   Dg Cervical Spine Complete  11/12/2015  CLINICAL DATA:  Intermittent cervicalgia for 1 year EXAM: CERVICAL SPINE - COMPLETE 4+ VIEW  COMPARISON:  Cervical MRI May 13, 2014 FINDINGS: Frontal, lateral, open-mouth odontoid, and bilateral oblique views were obtained. There is no fracture or spondylolisthesis. Prevertebral soft tissues and predental space regions are normal. There is a moderately severe disc space narrowing at C5-6. There is moderate disc space narrowing at C4-5 and C6-7. There is facet hypertrophy with exit foraminal narrowing at C5-6 bilaterally and at C6-7 on the right. There is calcification in the nuchal ligament posterior to C6. IMPRESSION: Multilevel osteoarthritic change.  No fracture or spondylolisthesis. Electronically Signed   By: Lowella Grip III M.D.   On: 11/12/2015 16:29   Dg Epidurography  11/07/2015  CLINICAL DATA:  Thoracic disc disease T7-8, T8-9 and T9-10. Pain in the thoracic spine radiating around the right side of the chest and upper abdomen. EXAM: Thoracic EPIDURAL INJECTION DIAGNOSTIC EPIDURAL INJECTION THERAPEUTIC EPIDURAL INJECTION COMPARISON:  MRI 10/09/2015 PROCEDURE: Thoracic EPIDURAL INJECTION The overlying skin was scrubbed with Betadine and draped in sterile fashion. Skin anesthesia was carried out using 1% Lidocaine. An interlaminar approach was performed on the right at T8-9.A 20 gauge spinal epidural needle was advanced using loss-of-resistance technique. DIAGNOSTIC EPIDURAL INJECTION Injection of Isovue-300 shows a good epidural pattern with spread above and below the level of needle placement, spreading to both sides. No vascular opacification is seen. THERAPEUTIC EPIDURAL INJECTION 1.5 cc of Kenalog 40 mixed with 1 cc of 1% Lidocaine and 2 cc of normal saline were then instilled. The procedure was well-tolerated, and the patient was discharged thirty minutes following the injection in good condition. FLUOROSCOPY TIME:  2 minutes 47 seconds. 365.93 micro gray meter squared IMPRESSION: Technically successful initial thoracic epidural injection on the right at T8-9. Electronically  Signed   By: Nelson Chimes M.D.   On: 11/07/2015 13:26   Dg Chest Port 1 View  11/25/2015  CLINICAL DATA:  Dyspnea. EXAM: PORTABLE CHEST 1 VIEW COMPARISON:  11/12/2015 FINDINGS: Lower lung volumes compared to prior exam. Heart at the upper limits normal in size likely accentuated by technique. Minimal left basilar atelectasis. No pulmonary edema, confluent airspace disease, large pleural effusion or pneumothorax. IMPRESSION: Lower lung volumes from exam 2 weeks prior. Accentuation of the cardiac size is likely secondary to technique. Left basilar atelectasis. Electronically Signed   By: Jeb Levering M.D.   On: 11/25/2015 05:41    Assessment & Plan    1.  NSTEMI:  pt presented to [facility] over the weekend after an episode of profound dizziness, dyspnea, and nausea on Thursday 5/25 followed by mild "indigestion" over the weekend.  Trop 0.41 on arrival and has since been trending down.  No further chest pain or dyspnea.  Still having some dizziness.  He is scheduled for diagnostic catheterization today.  The patient understands that risks include but are not limited to stroke (1 in 1000), death (1 in 59), kidney failure [usually temporary] (1 in 500), bleeding (1 in 200), allergic reaction [possibly serious] (1 in 200), and agrees to proceed.  Creatinine mildly elevated yesterday - f/u pending this AM.  Cont asa, statin,  blocker, heparin.  Though he has noted intermittent dark urine and blood clots over the past 6 mos, last ~ 2 wks ago, urine is currently clear, despite being heparinized.  H/H drifting down some but relatively stable.  He has a known h/o nephrolithiasis bilaterally and is followed by urology as an outpt.  If it is determined that he requires PCI, we may need to defer the procedure until after urology evaluation given recent hematuria/clots.  2.  Nephrolithiasis/hematuria:  As above, 6 month h/o chronic, moderate, R>L CVA tenderness and discomfort.  He is followed by urology as an outpt.   Last imaging that I can find in the [facility] system is a CT of the abd in 02/2014 - bilat, R>L, < 6mm renal stones.  About 2-3 wks ago, he noted dark urine followed by passing of clots and subsequent clearing of urine.  Says urine a little darker in the past 2 days.  Creat up slightly yesterday.  F/u this AM prior to proceeding with cath.  If higher, will need to defer cath and ask urology to see sooner rather than later - ? Obstruction.  If creat stable, would plan on diagnostic cath followed by urology eval as outlined in #1. UA on 5/28 negative.  3.  Hypertensive Heart Disease: BP stable on  blocker.  4.  HL:  LDL 81.  Previously statin naive.  Cont high potency statin therapy.  5.  GERD:  Stable on PPI.  Signed, Murray Hodgkins NP As above,  Patient seen and examined. He has not had chest pain or dyspnea overnight. He is scheduled for cardiac catheterization today for elevated troponin. Note he has a history of nephrolithiasis and has had recent gross hematuria. We will plan to recheck his renal function this morning. If stable proceed with cardiac catheterization. If he requires PCI  He may need urology consult prior to procedure to see if he will tolerate dual antiplatelet therapy long-term. If no significant coronary disease we will plan a urology consult after catheterization. His blood pressure is much improved. Continue present medications. Kirk Ruths

## 2015-11-27 NOTE — Progress Notes (Signed)
ANTICOAGULATION CONSULT NOTE - Follow Up Consult  Pharmacy Consult for Heparin Indication: chest pain/ACS  Allergies  Allergen Reactions  . Clams [Shellfish Allergy] Nausea And Vomiting  . Tetracyclines & Related Rash  . Tylenol [Acetaminophen] Rash    Patient Measurements: Height: 5\' 10"  (177.8 cm) Weight: 174 lb 9.6 oz (79.198 kg) (b scale) IBW/kg (Calculated) : 73 Heparin Dosing Weight: 78.8 kg  Vital Signs: Temp: 97.5 F (36.4 C) (05/30 1133) Temp Source: Oral (05/30 1133) BP: 119/65 mmHg (05/30 1133) Pulse Rate: 64 (05/30 1133)  Labs:  Recent Labs  11/25/15 0045 11/25/15 0423 11/25/15 1015  11/25/15 1637  11/26/15 0330 11/27/15 0325 11/27/15 1014  HGB 15.8  --   --   --   --   --  14.1 13.3  --   HCT 44.0  --   --   --   --   --  42.0 39.1  --   PLT 208  --   --   --   --   --  184 177  --   APTT  --  >200*  --   --   --   --   --   --   --   LABPROT  --  14.8  --   --   --   --   --   --   --   INR  --  1.14  --   --   --   --   --   --   --   HEPARINUNFRC  --   --   --   < >  --   < > 0.34 0.29* 0.54  CREATININE 0.85  --   --   --   --   --  1.25*  --  1.05  TROPONINI 0.41* 0.41* 0.33*  --  0.29*  --   --   --   --   < > = values in this interval not displayed.  Estimated Creatinine Clearance: 71.5 mL/min (by C-G formula based on Cr of 1.05).    Assessment: 65 yo male presented to [facility] with elevated BP over past 2 days. Transferred to [facility], plan for coronary angiography to be performed 5/30 Tuesday.   Heparin level is therapeutic at 0.54 on heparin 1100 units/hr. Nurse reports no issues with infusion or bleeding. Pt going to cath today.  Goal of Therapy:  Heparin level 0.3-0.7 units/ml Monitor platelets by anticoagulation protocol: Yes   Plan:  Continue heparin 1100 units/hr F/u post cath for plans to dc or continue Daily HL and CBC while on heparin gtt Monitor s/sx of bleeding   Thank you for allowing [place] to participate in this patients  care. Jens Som, PharmD Pager: [phone]  11/27/2015,12:05 PM

## 2015-11-27 NOTE — Progress Notes (Addendum)
Pt got mad over IV beeping once overnight, and other hospital stuff (lab + changing tele battery), according to him didn't get good sleep due to frequent interruption, although Xanax provided as per request and also patient found sleeping during the night rounds.

## 2015-11-27 NOTE — Interval H&P Note (Signed)
Cath Lab Visit (complete for each Cath Lab visit)  Clinical Evaluation Leading to the Procedure:   ACS: Yes.    Non-ACS:    Anginal Classification: CCS Greene  Anti-ischemic medical therapy: Maximal Therapy (2 or more classes of medications)  Non-Invasive Test Results: No non-invasive testing performed  Prior CABG: No previous CABG      History and Physical Interval Note:  11/27/2015 2:16 PM  FORNEY WESTMORELAND  has presented today for surgery, with the diagnosis of unstable angina  The various methods of treatment have been discussed with the patient and family. After consideration of risks, benefits and other options for treatment, the patient has consented to  Procedure(s): Left Heart Cath and Coronary Angiography (N/A) as a surgical intervention .  The patient's history has been reviewed, patient examined, no change in status, stable for surgery.  I have reviewed the patient's chart and labs.  Questions were answered to the patient's satisfaction.     Joel Greene

## 2015-11-27 NOTE — Care Management Important Message (Signed)
Important Message  Patient Details  Name: Joel Greene MRN: [id number] Date of Birth: Apr 11, 1951   Medicare Important Message Given:  Yes    Evaleigh Mccamy, Leroy Sea 11/27/2015, 7:59 AM

## 2015-11-27 NOTE — Progress Notes (Signed)
ANTICOAGULATION CONSULT NOTE - Follow Up Consult  Pharmacy Consult for Heparin Indication: chest pain/ACS  Allergies  Allergen Reactions  . Clams [Shellfish Allergy] Nausea And Vomiting  . Tetracyclines & Related Rash  . Tylenol [Acetaminophen] Rash    Patient Measurements: Height: 5\' 10"  (177.8 cm) Weight: 175 lb 4.8 oz (79.516 kg) IBW/kg (Calculated) : 73 Heparin Dosing Weight: 78.8 kg  Vital Signs: Temp: 98.2 F (36.8 C) (05/29 2106) Temp Source: Oral (05/29 2106) BP: 126/70 mmHg (05/29 2106) Pulse Rate: 90 (05/30 0021)  Labs:  Recent Labs  11/25/15 0045 11/25/15 0423 11/25/15 1015  11/25/15 1637 11/25/15 1954 11/26/15 0330 11/27/15 0325  HGB 15.8  --   --   --   --   --  14.1 13.3  HCT 44.0  --   --   --   --   --  42.0 39.1  PLT 208  --   --   --   --   --  184 177  APTT  --  >200*  --   --   --   --   --   --   LABPROT  --  14.8  --   --   --   --   --   --   INR  --  1.14  --   --   --   --   --   --   HEPARINUNFRC  --   --   --   < >  --  0.34 0.34 0.29*  CREATININE 0.85  --   --   --   --   --  1.25*  --   TROPONINI 0.41* 0.41* 0.33*  --  0.29*  --   --   --   < > = values in this interval not displayed.  Estimated Creatinine Clearance: 60 mL/min (by C-G formula based on Cr of 1.25).    Assessment: 65 yo male presented to [facility] ED with elevated BP over past 2 days. Transferred to [facility], plan for coronary angiography to be performed 5/30 Tuesday.   HL this AM is slightly subtherapeutic at 0.29 on heparin 1000 units/hr. Nurse reports no issues with infusion or bleeding.  Goal of Therapy:  Heparin level 0.3-0.7 units/ml Monitor platelets by anticoagulation protocol: Yes   Plan:  Increase heparin to 1100 units/hr 6h HL Daily HL and CBC Monitor s/sx of bleeding   Andrey Cota. Diona Foley, PharmD, [place] Clinical Pharmacist Pager [phone]  11/27/2015,5:06 AM

## 2015-11-27 NOTE — H&P (View-Only) (Signed)
Patient Name: Joel Greene Date of Encounter: 11/27/2015  Hospital Problem List     Principal Problem:   NSTEMI (non-ST elevated myocardial infarction) [facility]) Active Problems:   Chest pain with moderate risk of acute coronary syndrome   Elevated troponin   SOB (shortness of breath)   History of tobacco abuse   GERD (gastroesophageal reflux disease)   Uncontrolled hypertension   Nephrolithiasis   CVA tenderness   PUD (peptic ulcer disease)   DJD- spine injection 11/07/15   Anxiety    Subjective   No chest pain or sob overnight.  He is very concerned about bilat CVA tenderness, R>L.  This has been going on for at least 6 months and he is known to have bilateral renal stones (<8mm R>L - CT 03/2015).  He also says that a few wks ago, he had very dark urine, followed by the passing of clots with subsequent clearing of urine.  He hasn't seen blood or clots in his urine in about 2 wks.  Urine has been clear recently but says it was darker this AM. On for cath today - somewhat anxious.  Inpatient Medications    . aspirin EC  81 mg Oral Daily  . atorvastatin  40 mg Oral q1800  . metoprolol tartrate  12.5 mg Oral BID  . multivitamin with minerals  1 tablet Oral Daily  . pantoprazole  40 mg Oral BID AC  . sodium chloride flush  3 mL Intravenous Q12H  . tamsulosin  0.4 mg Oral Daily  . venlafaxine XR  37.5 mg Oral Q breakfast    Vital Signs    Filed Vitals:   11/26/15 1827 11/26/15 2106 11/27/15 0021 11/27/15 0712  BP:  126/70  102/65  Pulse:  89 90 54  Temp: 98.8 F (37.1 C) 98.2 F (36.8 C)  98.5 F (36.9 C)  TempSrc: Oral Oral  Oral  Resp: 18 17 17 16   Height:      Weight:    174 lb 9.6 oz (79.198 kg)  SpO2:  98% 98% 98%    Intake/Output Summary (Last 24 hours) at 11/27/15 0956 Last data filed at 11/27/15 0859  Gross per 24 hour  Intake    240 ml  Output   1920 ml  Net  -1680 ml   Filed Weights   11/25/15 0905 11/26/15 0658 11/27/15 0712  Weight: 173 lb 11.6 oz  (78.8 kg) 175 lb 4.8 oz (79.516 kg) 174 lb 9.6 oz (79.198 kg)    Physical Exam    General: Pleasant, NAD. Neuro: Alert and oriented X 3. Moves all extremities spontaneously. Psych: Normal affect. HEENT:  Normal  Neck: Supple without bruits or JVD. Lungs:  Resp regular and unlabored, CTA. Heart: RRR no s3, s4, or murmurs. Abdomen: Soft, non-tender, non-distended, BS + x 4. R>L CVA Tenderness. Extremities: No clubbing, cyanosis or edema. DP/PT/Radials 2+ and equal bilaterally.  Labs    CBC  Recent Labs  11/25/15 0045 11/26/15 0330 11/27/15 0325  WBC 5.0 5.0 4.7  NEUTROABS 3.6  --   --   HGB 15.8 14.1 13.3  HCT 44.0 42.0 39.1  MCV 91.1 93.3 94.2  PLT 208 184 [id number]   Basic Metabolic Panel  Recent Labs  11/25/15 0045 11/26/15 0330  NA 135 140  K 3.2* 4.3  CL 98* 106  CO2 28 27  GLUCOSE 127* 109*  BUN 9 16  CREATININE 0.85 1.25*  CALCIUM 9.5 9.2   Liver Function Tests  Recent  Labs  11/25/15 0045  AST 95*  ALT 80*  ALKPHOS 57  BILITOT 1.1  PROT 7.1  ALBUMIN 4.4   Cardiac Enzymes  Recent Labs  11/25/15 0423 11/25/15 1015 11/25/15 1637  TROPONINI 0.41* 0.33* 0.29*   Fasting Lipid Panel  Recent Labs  11/26/15 0330  CHOL 146  HDL 49  LDLCALC 81  TRIG 82  CHOLHDL 3.0   Thyroid Function Tests  Recent Labs  11/26/15 1044  TSH 1.291    Telemetry    RSR/Sinus Tachycardia.  Radiology    Dg Chest 2 View  11/12/2015  CLINICAL DATA:  Back pain. EXAM: CHEST  2 VIEW COMPARISON:  January 30, 2013. FINDINGS: The heart size and mediastinal contours are within normal limits. Both lungs are clear. No pneumothorax or pleural effusion is noted. The visualized skeletal structures are unremarkable. IMPRESSION: No active cardiopulmonary disease. Electronically Signed   By: Marijo Conception, M.D.   On: 11/12/2015 16:23   Dg Cervical Spine Complete  11/12/2015  CLINICAL DATA:  Intermittent cervicalgia for 1 year EXAM: CERVICAL SPINE - COMPLETE 4+ VIEW  COMPARISON:  Cervical MRI May 13, 2014 FINDINGS: Frontal, lateral, open-mouth odontoid, and bilateral oblique views were obtained. There is no fracture or spondylolisthesis. Prevertebral soft tissues and predental space regions are normal. There is a moderately severe disc space narrowing at C5-6. There is moderate disc space narrowing at C4-5 and C6-7. There is facet hypertrophy with exit foraminal narrowing at C5-6 bilaterally and at C6-7 on the right. There is calcification in the nuchal ligament posterior to C6. IMPRESSION: Multilevel osteoarthritic change.  No fracture or spondylolisthesis. Electronically Signed   By: Lowella Grip III M.D.   On: 11/12/2015 16:29   Dg Epidurography  11/07/2015  CLINICAL DATA:  Thoracic disc disease T7-8, T8-9 and T9-10. Pain in the thoracic spine radiating around the right side of the chest and upper abdomen. EXAM: Thoracic EPIDURAL INJECTION DIAGNOSTIC EPIDURAL INJECTION THERAPEUTIC EPIDURAL INJECTION COMPARISON:  MRI 10/09/2015 PROCEDURE: Thoracic EPIDURAL INJECTION The overlying skin was scrubbed with Betadine and draped in sterile fashion. Skin anesthesia was carried out using 1% Lidocaine. An interlaminar approach was performed on the right at T8-9.A 20 gauge spinal epidural needle was advanced using loss-of-resistance technique. DIAGNOSTIC EPIDURAL INJECTION Injection of Isovue-300 shows a good epidural pattern with spread above and below the level of needle placement, spreading to both sides. No vascular opacification is seen. THERAPEUTIC EPIDURAL INJECTION 1.5 cc of Kenalog 40 mixed with 1 cc of 1% Lidocaine and 2 cc of normal saline were then instilled. The procedure was well-tolerated, and the patient was discharged thirty minutes following the injection in good condition. FLUOROSCOPY TIME:  2 minutes 47 seconds. 365.93 micro gray meter squared IMPRESSION: Technically successful initial thoracic epidural injection on the right at T8-9. Electronically  Signed   By: Nelson Chimes M.D.   On: 11/07/2015 13:26   Dg Chest Port 1 View  11/25/2015  CLINICAL DATA:  Dyspnea. EXAM: PORTABLE CHEST 1 VIEW COMPARISON:  11/12/2015 FINDINGS: Lower lung volumes compared to prior exam. Heart at the upper limits normal in size likely accentuated by technique. Minimal left basilar atelectasis. No pulmonary edema, confluent airspace disease, large pleural effusion or pneumothorax. IMPRESSION: Lower lung volumes from exam 2 weeks prior. Accentuation of the cardiac size is likely secondary to technique. Left basilar atelectasis. Electronically Signed   By: Jeb Levering M.D.   On: 11/25/2015 05:41    Assessment & Plan    1.  NSTEMI:  pt presented to [facility] over the weekend after an episode of profound dizziness, dyspnea, and nausea on Thursday 5/25 followed by mild "indigestion" over the weekend.  Trop 0.41 on arrival and has since been trending down.  No further chest pain or dyspnea.  Still having some dizziness.  He is scheduled for diagnostic catheterization today.  The patient understands that risks include but are not limited to stroke (1 in 1000), death (1 in 60), kidney failure [usually temporary] (1 in 500), bleeding (1 in 200), allergic reaction [possibly serious] (1 in 200), and agrees to proceed.  Creatinine mildly elevated yesterday - f/u pending this AM.  Cont asa, statin,  blocker, heparin.  Though he has noted intermittent dark urine and blood clots over the past 6 mos, last ~ 2 wks ago, urine is currently clear, despite being heparinized.  H/H drifting down some but relatively stable.  He has a known h/o nephrolithiasis bilaterally and is followed by urology as an outpt.  If it is determined that he requires PCI, we may need to defer the procedure until after urology evaluation given recent hematuria/clots.  2.  Nephrolithiasis/hematuria:  As above, 6 month h/o chronic, moderate, R>L CVA tenderness and discomfort.  He is followed by urology as an outpt.   Last imaging that I can find in the [facility] system is a CT of the abd in 02/2014 - bilat, R>L, < 23mm renal stones.  About 2-3 wks ago, he noted dark urine followed by passing of clots and subsequent clearing of urine.  Says urine a little darker in the past 2 days.  Creat up slightly yesterday.  F/u this AM prior to proceeding with cath.  If higher, will need to defer cath and ask urology to see sooner rather than later - ? Obstruction.  If creat stable, would plan on diagnostic cath followed by urology eval as outlined in #1. UA on 5/28 negative.  3.  Hypertensive Heart Disease: BP stable on  blocker.  4.  HL:  LDL 81.  Previously statin naive.  Cont high potency statin therapy.  5.  GERD:  Stable on PPI.  Signed, Murray Hodgkins NP As above,  Patient seen and examined. He has not had chest pain or dyspnea overnight. He is scheduled for cardiac catheterization today for elevated troponin. Note he has a history of nephrolithiasis and has had recent gross hematuria. We will plan to recheck his renal function this morning. If stable proceed with cardiac catheterization. If he requires PCI  He may need urology consult prior to procedure to see if he will tolerate dual antiplatelet therapy long-term. If no significant coronary disease we will plan a urology consult after catheterization. His blood pressure is much improved. Continue present medications. Kirk Ruths

## 2015-11-28 ENCOUNTER — Inpatient Hospital Stay (HOSPITAL_COMMUNITY): Payer: Medicare Other

## 2015-11-28 DIAGNOSIS — R0789 Other chest pain: Secondary | ICD-10-CM

## 2015-11-28 DIAGNOSIS — R079 Chest pain, unspecified: Secondary | ICD-10-CM

## 2015-11-28 DIAGNOSIS — M549 Dorsalgia, unspecified: Secondary | ICD-10-CM

## 2015-11-28 LAB — ECHOCARDIOGRAM COMPLETE
Height: 70 in
Weight: 2816 oz

## 2015-11-28 LAB — CBC
HCT: 36.7 % — ABNORMAL LOW (ref 39.0–52.0)
Hemoglobin: 12.4 g/dL — ABNORMAL LOW (ref 13.0–17.0)
MCH: 31.6 pg (ref 26.0–34.0)
MCHC: 33.8 g/dL (ref 30.0–36.0)
MCV: 93.4 fL (ref 78.0–100.0)
Platelets: 172 10*3/uL (ref 150–400)
RBC: 3.93 MIL/uL — ABNORMAL LOW (ref 4.22–5.81)
RDW: 11.8 % (ref 11.5–15.5)
WBC: 4.2 10*3/uL (ref 4.0–10.5)

## 2015-11-28 MED ORDER — METOPROLOL TARTRATE 25 MG PO TABS: 12.5000 mg | ORAL_TABLET | Freq: Two times a day (BID) | ORAL | Status: DC

## 2015-11-28 NOTE — Discharge Summary (Signed)
Discharge Summary    Patient ID: Joel Greene,  MRN: [id number], DOB/AGE: [birth year] 65 y.o.  Admit date: 11/25/2015 Discharge date: 11/28/2015  Primary Care Provider: Glo Herring Primary Cardiologist: Dr. Bronson Ing   Discharge Diagnoses    Principal Problem:   Non-cardiac chest pain Active Problems:   History of tobacco abuse   GERD (gastroesophageal reflux disease)   Elevated troponin   SOB (shortness of breath)   PUD (peptic ulcer disease)   Uncontrolled hypertension   DJD- spine injection 11/07/15   Nephrolithiasis   Anxiety   CVA tenderness   Acute coronary syndrome (HCC)   Allergies Allergies  Allergen Reactions  . Clams [Shellfish Allergy] Nausea And Vomiting  . Tetracyclines & Related Rash  . Tylenol [Acetaminophen] Rash    Diagnostic Studies/Procedures    Procedures    Left Heart Cath and Coronary Angiography    Conclusion     Normal coronary arteries  Normal left ventricular function. LVEF is 60%. Normal filling pressures with EDP of 8 mmHg.  Elevated troponin not related to obstructive coronary disease.   2D Echo 11/28/15 Study Conclusions  - Left ventricle: The cavity size was normal. Systolic function was  normal. Probable hypokinesis of the entireinferolateral  myocardium. There was an increased relative contribution of  atrial contraction to ventricular filling. Doppler parameters are  consistent with abnormal left ventricular relaxation (grade 1  diastolic dysfunction). - Aortic valve: Trileaflet; mildly thickened, mildly calcified  leaflets.  DG of Ribs 11/28/15 FINDINGS: No fracture or other bone lesions are seen involving the ribs. No pneumothorax or pleural effusion is noted.  IMPRESSION: Normal right ribs.     History of Present Illness     65 y/o male with a history of remote smoking, PUD by endoscopy in Jan 2017, anxiety, sleep apnea, and DJD. He was seen in 2009 for chest pain and had an echo which  presumably was normal. He has had some back pain recently and had a spinal injection 11/07/15.  He presented to the ED on 11/25/15 with a complaint of chest pain and dyspnea. He had also noted RUQ pain and hematuria. He has a prior h/o nephrolithiasis. BP was elevated in the ED at 150/100. Troponin was elevated  at 0.41. He was started on IV heparin and cardiology was asked to admit for further evaluation.   Hospital Course     Patient was admitted to telemetry and continued on IV heparin. Cardiac enzymes were cycled x 3 and were abnormal. Troponin peaked at 0.41 and drifted downward to 0.33>>0.29. LHC was recommended. This was performed on 11/27/15. He was found to have normal coronaries. EF was 60% by cath. He was monitored post cath and had no post cath complications. 2D echo showed normal LVF and no significant valve abnormalities. He continued to have recurrent pain, more localized to his right rib cage. Also with CVA tenderness, but negative UA. Subsequently, plain film of his ribs was obtain. This was a normal study w/o evidence for fracture.  It was recommended that he f/u with his urologist and PCP for further outpatient w/u.  BP improved with addition of metoprolol. He had no other issues and no further inpatient w/u was indicated. He was last seen and examined by Dr. Stanford Breed, who determined he was stable for discharge home.    Consultants: none  Discharge Vitals Blood pressure 120/70, pulse 67, temperature 99.5 F (37.5 C), temperature source Oral, resp. rate 20, height 5\' 10"  (1.778 m), weight 176  lb (79.833 kg), SpO2 95 %.  Filed Weights   11/26/15 0658 11/27/15 0712 11/28/15 0504  Weight: 175 lb 4.8 oz (79.516 kg) 174 lb 9.6 oz (79.198 kg) 176 lb (79.833 kg)    Labs & Radiologic Studies    CBC  Recent Labs  11/27/15 0325 11/28/15 0451  WBC 4.7 4.2  HGB 13.3 12.4*  HCT 39.1 36.7*  MCV 94.2 93.4  PLT 177 Q000111Q   Basic Metabolic Panel  Recent Labs  11/26/15 0330  11/27/15 1014  NA 140 138  K 4.3 4.2  CL 106 107  CO2 27 26  GLUCOSE 109* 106*  BUN 16 18  CREATININE 1.25* 1.05  CALCIUM 9.2 9.3   Liver Function Tests No results for input(s): AST, ALT, ALKPHOS, BILITOT, PROT, ALBUMIN in the last 72 hours. No results for input(s): LIPASE, AMYLASE in the last 72 hours. Cardiac Enzymes  Recent Labs  11/25/15 1637  TROPONINI 0.29*   BNP Invalid input(s): POCBNP D-Dimer No results for input(s): DDIMER in the last 72 hours. Hemoglobin A1C No results for input(s): HGBA1C in the last 72 hours. Fasting Lipid Panel  Recent Labs  11/26/15 0330  CHOL 146  HDL 49  LDLCALC 81  TRIG 82  CHOLHDL 3.0   Thyroid Function Tests  Recent Labs  11/26/15 1044  TSH 1.291   _____________  Dg Chest 2 View  11/12/2015  CLINICAL DATA:  Back pain. EXAM: CHEST  2 VIEW COMPARISON:  January 30, 2013. FINDINGS: The heart size and mediastinal contours are within normal limits. Both lungs are clear. No pneumothorax or pleural effusion is noted. The visualized skeletal structures are unremarkable. IMPRESSION: No active cardiopulmonary disease. Electronically Signed   By: Marijo Conception, M.D.   On: 11/12/2015 16:23   Dg Ribs Unilateral Right  11/28/2015  CLINICAL DATA:  Chronic right rib pain without known injury. EXAM: RIGHT RIBS - 2 VIEW COMPARISON:  Nov 25, 2015. FINDINGS: No fracture or other bone lesions are seen involving the ribs. No pneumothorax or pleural effusion is noted. IMPRESSION: Normal right ribs. Electronically Signed   By: Marijo Conception, M.D.   On: 11/28/2015 10:16   Dg Cervical Spine Complete  11/12/2015  CLINICAL DATA:  Intermittent cervicalgia for 1 year EXAM: CERVICAL SPINE - COMPLETE 4+ VIEW COMPARISON:  Cervical MRI May 13, 2014 FINDINGS: Frontal, lateral, open-mouth odontoid, and bilateral oblique views were obtained. There is no fracture or spondylolisthesis. Prevertebral soft tissues and predental space regions are normal. There is  a moderately severe disc space narrowing at C5-6. There is moderate disc space narrowing at C4-5 and C6-7. There is facet hypertrophy with exit foraminal narrowing at C5-6 bilaterally and at C6-7 on the right. There is calcification in the nuchal ligament posterior to C6. IMPRESSION: Multilevel osteoarthritic change.  No fracture or spondylolisthesis. Electronically Signed   By: Lowella Grip III M.D.   On: 11/12/2015 16:29   Dg Epidurography  11/07/2015  CLINICAL DATA:  Thoracic disc disease T7-8, T8-9 and T9-10. Pain in the thoracic spine radiating around the right side of the chest and upper abdomen. EXAM: Thoracic EPIDURAL INJECTION DIAGNOSTIC EPIDURAL INJECTION THERAPEUTIC EPIDURAL INJECTION COMPARISON:  MRI 10/09/2015 PROCEDURE: Thoracic EPIDURAL INJECTION The overlying skin was scrubbed with Betadine and draped in sterile fashion. Skin anesthesia was carried out using 1% Lidocaine. An interlaminar approach was performed on the right at T8-9.A 20 gauge spinal epidural needle was advanced using loss-of-resistance technique. DIAGNOSTIC EPIDURAL INJECTION Injection of Isovue-300 shows a good  epidural pattern with spread above and below the level of needle placement, spreading to both sides. No vascular opacification is seen. THERAPEUTIC EPIDURAL INJECTION 1.5 cc of Kenalog 40 mixed with 1 cc of 1% Lidocaine and 2 cc of normal saline were then instilled. The procedure was well-tolerated, and the patient was discharged thirty minutes following the injection in good condition. FLUOROSCOPY TIME:  2 minutes 47 seconds. 365.93 micro gray meter squared IMPRESSION: Technically successful initial thoracic epidural injection on the right at T8-9. Electronically Signed   By: Nelson Chimes M.D.   On: 11/07/2015 13:26   Dg Chest Port 1 View  11/25/2015  CLINICAL DATA:  Dyspnea. EXAM: PORTABLE CHEST 1 VIEW COMPARISON:  11/12/2015 FINDINGS: Lower lung volumes compared to prior exam. Heart at the upper limits normal in  size likely accentuated by technique. Minimal left basilar atelectasis. No pulmonary edema, confluent airspace disease, large pleural effusion or pneumothorax. IMPRESSION: Lower lung volumes from exam 2 weeks prior. Accentuation of the cardiac size is likely secondary to technique. Left basilar atelectasis. Electronically Signed   By: Jeb Levering M.D.   On: 11/25/2015 05:41   Disposition   Pt is being discharged home today in good condition.  Follow-up Plans & Appointments    Follow-up Information    Follow up with [name], MD. Schedule an appointment as soon as possible for a visit in 1 week.   Specialty:  Internal Medicine   Contact information:   [address] [place] [id number] [phone]      Discharge Instructions    Diet - low sodium heart healthy    Complete by:  As directed      Increase activity slowly    Complete by:  As directed            Discharge Medications   Current Discharge Medication List    START taking these medications   Details  metoprolol tartrate (LOPRESSOR) 25 MG tablet Take 0.5 tablets (12.5 mg total) by mouth 2 (two) times daily. Qty: 60 tablet, Refills: 5      CONTINUE these medications which have NOT CHANGED   Details  ALPRAZolam (XANAX) 1 MG tablet Take 1 mg by mouth 2 (two) times daily as needed for anxiety or sleep.     aspirin EC 81 MG tablet Take 81 mg by mouth daily.    Cholecalciferol (VITAMIN D3) 5000 units TABS Take 5,000 Units by mouth daily.    cyclobenzaprine (FLEXERIL) 10 MG tablet Take 1 tablet (10 mg total) by mouth 3 (three) times daily as needed for muscle spasms. Qty: 30 tablet, Refills: 0    fluticasone (FLONASE) 50 MCG/ACT nasal spray INHALE 1 SPRAYS IN EACH NOSTRIL ONCE DAILY. Qty: 16 g, Refills: 3   Associated Diagnoses: Insomnia with sleep apnea; OSA on CPAP    ibuprofen (ADVIL,MOTRIN) 200 MG tablet Take 400 mg by mouth every 6 (six) hours as needed (pain).    Misc Natural Products  (COLON CLEANSE PO) Take 1 capsule by mouth daily as needed (constipation).    Multiple Vitamin (MULITIVITAMIN WITH MINERALS) TABS Take 1 tablet by mouth daily.    mupirocin ointment (BACTROBAN) 2 % Place 1 application into the nose at bedtime. For dryness from CPAP    Oxycodone HCl 10 MG TABS Take 10 mg by mouth every 4 (four) hours as needed (pain).     pantoprazole (PROTONIX) 40 MG tablet TAKE (1) TABLET BY MOUTH TWICE A DAY BEFORE MEALS. (BREAKFAST AND SUPPER). Qty: 60 tablet,  Refills: 5    Polyethyl Glycol-Propyl Glycol (SYSTANE OP) Place 1 drop into both eyes daily as needed (seasonal allergies/ dry eyes).    PRESCRIPTION MEDICATION Inhale into the lungs at bedtime. CPAP    Probiotic Product (PROBIOTIC PO) Take 1 tablet by mouth daily.    silver sulfADIAZINE (SILVADENE) 1 % cream Apply 1 application topically daily as needed (wound care).    tamsulosin (FLOMAX) 0.4 MG CAPS capsule Take 0.4 mg by mouth daily after breakfast.     venlafaxine XR (EFFEXOR XR) 37.5 MG 24 hr capsule Take 1 capsule (37.5 mg total) by mouth daily with breakfast. Qty: 30 capsule, Refills: 5   Associated Diagnoses: Depression; Anxiety    zolpidem (AMBIEN CR) 12.5 MG CR tablet Take 1 tablet (12.5 mg total) by mouth at bedtime as needed for sleep. Qty: 30 tablet, Refills: 3   Associated Diagnoses: Insomnia with sleep apnea; OSA on CPAP    dicyclomine (BENTYL) 10 MG capsule TAKE 1 CAPSULE 3 TIMES DAILY. Qty: 90 capsule, Refills: 0         Outstanding Labs/Studies   none  Duration of Discharge Encounter   Greater than 30 minutes including physician time.  Signed, Lyda Jester PA-C 11/28/2015, 1:45 PM

## 2015-11-28 NOTE — Progress Notes (Signed)
Patient is discharge to home accompanied by patient's daughter. Discharge instructions given . Patient verbalizes understanding. All personal belongings given. Telemetry box and IV removed prior to discharge and site in good condition.

## 2015-11-28 NOTE — Progress Notes (Signed)
Patient Name: Joel Greene Date of Encounter: 11/28/2015  Hospital Problem List     Principal Problem:   NSTEMI (non-ST elevated myocardial infarction) [facility]) Active Problems:   Chest pain with moderate risk of acute coronary syndrome   History of tobacco abuse   GERD (gastroesophageal reflux disease)   Elevated troponin   SOB (shortness of breath)   PUD (peptic ulcer disease)   Uncontrolled hypertension   DJD- spine injection 11/07/15   Nephrolithiasis   Anxiety   CVA tenderness   Acute coronary syndrome (HCC)    Subjective   No chest pain or dyspnea; continues to have pain in right rib area  Inpatient Medications    . aspirin EC  81 mg Oral Daily  . metoprolol tartrate  12.5 mg Oral BID  . multivitamin with minerals  1 tablet Oral Daily  . pantoprazole  40 mg Oral BID AC  . sodium chloride flush  3 mL Intravenous Q12H  . tamsulosin  0.4 mg Oral Daily  . venlafaxine XR  37.5 mg Oral Q breakfast    Vital Signs    Filed Vitals:   11/27/15 1755 11/27/15 2039 11/28/15 0028 11/28/15 0504  BP: 110/55 126/66 114/56 121/64  Pulse: 75 75 59 80  Temp:  98.2 F (36.8 C) 98.5 F (36.9 C) 98.6 F (37 C)  TempSrc:  Oral Oral Oral  Resp: 16 16 16 16   Height:      Weight:    176 lb (79.833 kg)  SpO2: 100% 100% 98% 96%    Intake/Output Summary (Last 24 hours) at 11/28/15 0859 Last data filed at 11/28/15 0600  Gross per 24 hour  Intake    960 ml  Output    750 ml  Net    210 ml   Filed Weights   11/26/15 0658 11/27/15 0712 11/28/15 0504  Weight: 175 lb 4.8 oz (79.516 kg) 174 lb 9.6 oz (79.198 kg) 176 lb (79.833 kg)    Physical Exam    General: Pleasant, NAD. Neuro: grossly intact HEENT:  Normal  Neck: Supple Lungs:  CTA. Heart: RRR  Abdomen: Soft, non-tender, non-distended, tender over right rib area Extremities: No edema.   Labs    CBC  Recent Labs  11/27/15 0325 11/28/15 0451  WBC 4.7 4.2  HGB 13.3 12.4*  HCT 39.1 36.7*  MCV 94.2 93.4  PLT  177 Q000111Q   Basic Metabolic Panel  Recent Labs  11/26/15 0330 11/27/15 1014  NA 140 138  K 4.3 4.2  CL 106 107  CO2 27 26  GLUCOSE 109* 106*  BUN 16 18  CREATININE 1.25* 1.05  CALCIUM 9.2 9.3   Cardiac Enzymes  Recent Labs  11/25/15 1015 11/25/15 1637  TROPONINI 0.33* 0.29*   Fasting Lipid Panel  Recent Labs  11/26/15 0330  CHOL 146  HDL 49  LDLCALC 81  TRIG 82  CHOLHDL 3.0   Thyroid Function Tests  Recent Labs  11/26/15 1044  TSH 1.291    Telemetry    Sinus  Radiology    Dg Chest 2 View  11/12/2015  CLINICAL DATA:  Back pain. EXAM: CHEST  2 VIEW COMPARISON:  January 30, 2013. FINDINGS: The heart size and mediastinal contours are within normal limits. Both lungs are clear. No pneumothorax or pleural effusion is noted. The visualized skeletal structures are unremarkable. IMPRESSION: No active cardiopulmonary disease. Electronically Signed   By: Marijo Conception, M.D.   On: 11/12/2015 16:23   Dg Cervical Spine  Complete  11/12/2015  CLINICAL DATA:  Intermittent cervicalgia for 1 year EXAM: CERVICAL SPINE - COMPLETE 4+ VIEW COMPARISON:  Cervical MRI May 13, 2014 FINDINGS: Frontal, lateral, open-mouth odontoid, and bilateral oblique views were obtained. There is no fracture or spondylolisthesis. Prevertebral soft tissues and predental space regions are normal. There is a moderately severe disc space narrowing at C5-6. There is moderate disc space narrowing at C4-5 and C6-7. There is facet hypertrophy with exit foraminal narrowing at C5-6 bilaterally and at C6-7 on the right. There is calcification in the nuchal ligament posterior to C6. IMPRESSION: Multilevel osteoarthritic change.  No fracture or spondylolisthesis. Electronically Signed   By: Lowella Grip III M.D.   On: 11/12/2015 16:29   Dg Epidurography  11/07/2015  CLINICAL DATA:  Thoracic disc disease T7-8, T8-9 and T9-10. Pain in the thoracic spine radiating around the right side of the chest and upper  abdomen. EXAM: Thoracic EPIDURAL INJECTION DIAGNOSTIC EPIDURAL INJECTION THERAPEUTIC EPIDURAL INJECTION COMPARISON:  MRI 10/09/2015 PROCEDURE: Thoracic EPIDURAL INJECTION The overlying skin was scrubbed with Betadine and draped in sterile fashion. Skin anesthesia was carried out using 1% Lidocaine. An interlaminar approach was performed on the right at T8-9.A 20 gauge spinal epidural needle was advanced using loss-of-resistance technique. DIAGNOSTIC EPIDURAL INJECTION Injection of Isovue-300 shows a good epidural pattern with spread above and below the level of needle placement, spreading to both sides. No vascular opacification is seen. THERAPEUTIC EPIDURAL INJECTION 1.5 cc of Kenalog 40 mixed with 1 cc of 1% Lidocaine and 2 cc of normal saline were then instilled. The procedure was well-tolerated, and the patient was discharged thirty minutes following the injection in good condition. FLUOROSCOPY TIME:  2 minutes 47 seconds. 365.93 micro gray meter squared IMPRESSION: Technically successful initial thoracic epidural injection on the right at T8-9. Electronically Signed   By: Nelson Chimes M.D.   On: 11/07/2015 13:26   Dg Chest Port 1 View  11/25/2015  CLINICAL DATA:  Dyspnea. EXAM: PORTABLE CHEST 1 VIEW COMPARISON:  11/12/2015 FINDINGS: Lower lung volumes compared to prior exam. Heart at the upper limits normal in size likely accentuated by technique. Minimal left basilar atelectasis. No pulmonary edema, confluent airspace disease, large pleural effusion or pneumothorax. IMPRESSION: Lower lung volumes from exam 2 weeks prior. Accentuation of the cardiac size is likely secondary to technique. Left basilar atelectasis. Electronically Signed   By: Jeb Levering M.D.   On: 11/25/2015 05:41    Assessment & Plan    1.  Elevated troponin: Cath reveals no significant CAD. No further eval. Echo pending.  2.  RUQ pain/Nephrolithiasis/hematuria:  6 month h/o chronic, moderate, R>L CVA tenderness and discomfort.   He is followed by urology as an outpt.  Pain on exam seems more localized to rib area. will obtain rib films. UA on 5/28 negative. If rib films negative, he would like to fu with urology and his primary care as outpt for further eval.  3.  Hypertensive Heart Disease: BP stable on  blocker.  4.  HL:  LDL 81.  DC statin.  5.  GERD:  Stable on PPI. > 30 min PA and physician time D2 Signed, Kirk Ruths MD

## 2015-11-28 NOTE — Progress Notes (Signed)
Echocardiogram 2D Echocardiogram has been performed.  Aggie Cosier 11/28/2015, 9:41 AM

## 2015-12-04 DIAGNOSIS — M47812 Spondylosis without myelopathy or radiculopathy, cervical region: Secondary | ICD-10-CM | POA: Diagnosis not present

## 2015-12-04 DIAGNOSIS — M47892 Other spondylosis, cervical region: Secondary | ICD-10-CM | POA: Diagnosis not present

## 2015-12-05 DIAGNOSIS — Z1389 Encounter for screening for other disorder: Secondary | ICD-10-CM | POA: Diagnosis not present

## 2015-12-05 DIAGNOSIS — I10 Essential (primary) hypertension: Secondary | ICD-10-CM | POA: Diagnosis not present

## 2015-12-05 DIAGNOSIS — F419 Anxiety disorder, unspecified: Secondary | ICD-10-CM | POA: Diagnosis not present

## 2015-12-05 DIAGNOSIS — R079 Chest pain, unspecified: Secondary | ICD-10-CM | POA: Diagnosis not present

## 2015-12-05 DIAGNOSIS — F329 Major depressive disorder, single episode, unspecified: Secondary | ICD-10-CM | POA: Diagnosis not present

## 2015-12-05 DIAGNOSIS — R0600 Dyspnea, unspecified: Secondary | ICD-10-CM | POA: Diagnosis not present

## 2015-12-05 DIAGNOSIS — Z6825 Body mass index (BMI) 25.0-25.9, adult: Secondary | ICD-10-CM | POA: Diagnosis not present

## 2015-12-10 ENCOUNTER — Other Ambulatory Visit: Payer: Self-pay | Admitting: Neurosurgery

## 2015-12-10 DIAGNOSIS — M47812 Spondylosis without myelopathy or radiculopathy, cervical region: Secondary | ICD-10-CM

## 2015-12-19 ENCOUNTER — Other Ambulatory Visit: Payer: Medicare Other

## 2015-12-26 ENCOUNTER — Other Ambulatory Visit: Payer: Self-pay | Admitting: Neurosurgery

## 2015-12-26 ENCOUNTER — Ambulatory Visit
Admission: RE | Admit: 2015-12-26 | Discharge: 2015-12-26 | Disposition: A | Discharge status: Home / Self Care | Payer: Medicare Other | Source: Ambulatory Visit | Attending: Neurosurgery | Admitting: Neurosurgery

## 2015-12-26 DIAGNOSIS — M47812 Spondylosis without myelopathy or radiculopathy, cervical region: Secondary | ICD-10-CM

## 2015-12-26 DIAGNOSIS — M542 Cervicalgia: Secondary | ICD-10-CM | POA: Diagnosis not present

## 2015-12-26 MED ORDER — DEXAMETHASONE SODIUM PHOSPHATE 4 MG/ML IJ SOLN: 6.0000 mg | Freq: Once | INTRAMUSCULAR | Status: DC

## 2015-12-26 NOTE — Discharge Instructions (Signed)

## 2016-01-17 DIAGNOSIS — M47812 Spondylosis without myelopathy or radiculopathy, cervical region: Secondary | ICD-10-CM | POA: Diagnosis not present

## 2016-01-17 DIAGNOSIS — M5414 Radiculopathy, thoracic region: Secondary | ICD-10-CM | POA: Diagnosis not present

## 2016-01-21 ENCOUNTER — Other Ambulatory Visit: Payer: Self-pay | Admitting: Neurosurgery

## 2016-01-21 DIAGNOSIS — M5414 Radiculopathy, thoracic region: Secondary | ICD-10-CM

## 2016-01-21 DIAGNOSIS — M47812 Spondylosis without myelopathy or radiculopathy, cervical region: Secondary | ICD-10-CM

## 2016-01-30 ENCOUNTER — Ambulatory Visit
Admission: RE | Admit: 2016-01-30 | Discharge: 2016-01-30 | Disposition: A | Discharge status: Home / Self Care | Payer: Medicare Other | Source: Ambulatory Visit | Attending: Neurosurgery | Admitting: Neurosurgery

## 2016-01-30 DIAGNOSIS — M5414 Radiculopathy, thoracic region: Secondary | ICD-10-CM

## 2016-01-30 DIAGNOSIS — M47812 Spondylosis without myelopathy or radiculopathy, cervical region: Secondary | ICD-10-CM

## 2016-01-30 DIAGNOSIS — M47814 Spondylosis without myelopathy or radiculopathy, thoracic region: Secondary | ICD-10-CM | POA: Diagnosis not present

## 2016-01-30 MED ORDER — IOPAMIDOL (ISOVUE-M 300) INJECTION 61%
1.0000 mL | Freq: Once | INTRAMUSCULAR | Status: AC | PRN
  Administered 2016-01-30: 1 mL via EPIDURAL

## 2016-01-30 MED ORDER — TRIAMCINOLONE ACETONIDE 40 MG/ML IJ SUSP (RADIOLOGY)
60.0000 mg | Freq: Once | INTRAMUSCULAR | Status: AC
  Administered 2016-01-30: 60 mg via EPIDURAL

## 2016-01-30 NOTE — Discharge Instructions (Signed)

## 2016-02-28 DIAGNOSIS — M47892 Other spondylosis, cervical region: Secondary | ICD-10-CM | POA: Diagnosis not present

## 2016-02-28 DIAGNOSIS — M5414 Radiculopathy, thoracic region: Secondary | ICD-10-CM | POA: Diagnosis not present

## 2016-03-12 ENCOUNTER — Other Ambulatory Visit: Payer: Self-pay | Admitting: Neurosurgery

## 2016-03-12 DIAGNOSIS — M5414 Radiculopathy, thoracic region: Secondary | ICD-10-CM

## 2016-03-20 ENCOUNTER — Ambulatory Visit
Admission: RE | Admit: 2016-03-20 | Discharge: 2016-03-20 | Disposition: A | Discharge status: Home / Self Care | Payer: Medicare Other | Source: Ambulatory Visit | Attending: Neurosurgery | Admitting: Neurosurgery

## 2016-03-20 DIAGNOSIS — M549 Dorsalgia, unspecified: Secondary | ICD-10-CM | POA: Diagnosis not present

## 2016-03-20 DIAGNOSIS — M5414 Radiculopathy, thoracic region: Secondary | ICD-10-CM

## 2016-03-20 MED ORDER — TRIAMCINOLONE ACETONIDE 40 MG/ML IJ SUSP (RADIOLOGY)
60.0000 mg | Freq: Once | INTRAMUSCULAR | Status: AC
  Administered 2016-03-20: 60 mg via EPIDURAL

## 2016-03-20 MED ORDER — IOHEXOL 300 MG/ML  SOLN
1.0000 mL | Freq: Once | INTRAMUSCULAR | Status: AC | PRN
  Administered 2016-03-20: 1 mL via EPIDURAL

## 2016-03-20 NOTE — Discharge Instructions (Signed)

## 2016-04-14 ENCOUNTER — Other Ambulatory Visit: Payer: Self-pay | Admitting: Neurology

## 2016-04-14 DIAGNOSIS — F329 Major depressive disorder, single episode, unspecified: Secondary | ICD-10-CM

## 2016-04-14 DIAGNOSIS — F419 Anxiety disorder, unspecified: Secondary | ICD-10-CM

## 2016-04-14 DIAGNOSIS — F32A Depression, unspecified: Secondary | ICD-10-CM

## 2016-04-15 ENCOUNTER — Telehealth: Payer: Self-pay

## 2016-04-15 ENCOUNTER — Telehealth: Payer: Self-pay | Admitting: Neurology

## 2016-04-15 DIAGNOSIS — G4700 Insomnia, unspecified: Secondary | ICD-10-CM

## 2016-04-15 DIAGNOSIS — G4733 Obstructive sleep apnea (adult) (pediatric): Secondary | ICD-10-CM

## 2016-04-15 DIAGNOSIS — Z9989 Dependence on other enabling machines and devices: Secondary | ICD-10-CM

## 2016-04-15 DIAGNOSIS — G4730 Sleep apnea, unspecified: Principal | ICD-10-CM

## 2016-04-15 MED ORDER — ZOLPIDEM TARTRATE ER 12.5 MG PO TBCR: 12.5000 mg | EXTENDED_RELEASE_TABLET | Freq: Every evening | ORAL | 3 refills | Status: DC | PRN

## 2016-04-15 NOTE — Telephone Encounter (Signed)
Margaretha Sheffield called on behalf of Pharmacist, [place] regarding status of refill request for zolpidem (AMBIEN CR) 12.5 MG CR tablet. Please call to advise [phone].

## 2016-04-15 NOTE — Telephone Encounter (Signed)
Refill request

## 2016-04-16 NOTE — Telephone Encounter (Signed)
Dr. Rexene Alberts signed Rx and I am faxing now.

## 2016-04-21 ENCOUNTER — Encounter: Payer: Self-pay | Admitting: Neurology

## 2016-04-23 ENCOUNTER — Ambulatory Visit (INDEPENDENT_AMBULATORY_CARE_PROVIDER_SITE_OTHER): Payer: Medicare Other | Admitting: Neurology

## 2016-04-23 ENCOUNTER — Encounter: Payer: Self-pay | Admitting: Neurology

## 2016-04-23 VITALS — BP 138/70 | HR 72 | Resp 16 | Ht 70.0 in | Wt 194.0 lb

## 2016-04-23 DIAGNOSIS — F418 Other specified anxiety disorders: Secondary | ICD-10-CM | POA: Diagnosis not present

## 2016-04-23 DIAGNOSIS — Z9989 Dependence on other enabling machines and devices: Secondary | ICD-10-CM

## 2016-04-23 DIAGNOSIS — G4730 Sleep apnea, unspecified: Secondary | ICD-10-CM | POA: Diagnosis not present

## 2016-04-23 DIAGNOSIS — G4733 Obstructive sleep apnea (adult) (pediatric): Secondary | ICD-10-CM | POA: Diagnosis not present

## 2016-04-23 DIAGNOSIS — G4700 Insomnia, unspecified: Secondary | ICD-10-CM | POA: Diagnosis not present

## 2016-04-23 DIAGNOSIS — I249 Acute ischemic heart disease, unspecified: Secondary | ICD-10-CM

## 2016-04-23 NOTE — Progress Notes (Signed)
Subjective:    Patient ID: Joel Greene is a 65 y.o. male.  HPI     Interim history:   Joel Greene is a 65 year old right-handed gentleman with an underlying medical history of anxiety, recurrent UTIs, remote Hx of smoking, mild obesity, and nephrolithiasis, who presents for followup consultation of his obstructive sleep apnea, treated with CPAP. He is unaccompanied today. I last saw him on 10/17/2015, at which time he reported ongoing issues with sleep maintenance. He was compliant with CPAP therapy. [name] did not help and was also no longer covered by his insurance. He was on generic Ambien per primary care physician and had tried Ambien CR in the past. I did not recommend trazodone or temazepam or Ambien. He requested to try Ambien CR again and I prescribed this in generic form. He was having some issues with depression. He had had side effects with Lexapro in the past including insomnia. I suggested low-dose long-acting Effexor at 37.5 mg daily.   Today, 04/23/2016: I reviewed his CPAP compliance data from 03/23/2016 through 04/21/2016 which is a total of 30 days, during which time he used his CPAP only 19 days with percent used days greater than 4 hours at 60% only, indicating suboptimal compliance with an average usage of 5 hours and 15 minutes, residual AHI 2.7 per hour, leaked low with the 95th percentile at 4.7 L/m on a pressure of 8 cm with EPR of 1.   Today, 04/23/2016: He reports doing much better after starting the Effexor XR 37.5 mg daily, but it took about 5 weeks for it to work. Stress level also better. He is taking Ambien CR 12.5 mg prn, up to 4/week. He had some problems with mouth breathing and tried a full mask which she could not tolerate. He also admits that he forgot his CPAP machine when he recently took a trip.  He was in the hospital in the interim in May 2017 for chest pain, workup was negative for heart attack. He requested a refill on the Ambien CR in the interim.    Previously:    I saw him on 10/10/2014, at which time he reported that he recently had rotator cuff surgery on the right, December 2015. He also saw Dr. Glenna Fellows for cervical spine disease. He was supposed to see Dr. Francesco Runner for neck injections. He was also referred to a hand surgeon for right hand Dupuytren's contracture. As far as his sleep is concerned he was compliant with treatment with CPAP and continued to endorse good results in fact felt he could not sleep without it. For sleep maintenance and sleep initiation issues we had tried sonata and he requested a refill. He was using it about 4 times a week. He had to change his DME company after his insurance changed after he turned 45. He had some neck pain with radiation to the right.    I reviewed his CPAP compliance data from 09/16/2015 through 10/15/2015 which is a total of 30 days during which time he used his machine 29 days with percent used days greater than 4 hours at 93%, indicating excellent compliance with an average usage of 8 hours and 45 minutes, residual AHI 2.8 per hour, leak low for the 95th percentile at 1.8 L/m on a pressure of 8 cm.    I saw him on 04/11/2014, at which time he reported that sleep maintenance was still an issue from time to time. He was using Ambien as needed. He felt it was  not as helpful any longer. He was compliant with treatment. He had no residual symptoms after his recent fall from a ladder.    I reviewed his CPAP compliance data from 09/09/2014 through 10/08/2014 which is a total of 30 days during which time he used his machine every night with percent used days greater than 4 hours of 100%, indicating superb compliance with an average usage of 11 hours and 9 minutes and residual AHI low at 1.7 per hour, leak low with the 95th percentile at 1.4 L/m on a pressure of 8 cm without EPR.   I saw him on 10/12/2013, at which time we talked about his sleep study results from March. We also talked about his  compliance data. He reported sleeping much better, feeling much less restless, and less sleepy during the day. Nocturia had improved. He did report some soreness in his left nostril due to the mask. He had been taking care of his mother's estate who passed away in 08/11/2013. He did not have to use his oxygen at night once he was placed on CPAP therapy. In the interim, he presented to the emergency room on 11/18/2013 after falling off of ladder. He fell while cutting limbs from a tree and fell about 8 feet. He hurt his back and also sustained a laceration of the scrotum.   I reviewed the emergency room records and his imaging test results from 11/18/2013: Dg Thoracic Spine W/swimmers: Negative.   Dg Lumbar Spine Complete: No acute osseous abnormality of the lumbar spine. Dg Pelvis 1-2 Views: Negative.   Ct Head and cervical spine Wo Contrast: 1. No acute intracranial or cervical spine findings. 2. Cervical spondylosis. 3. Small hypodensity in the right external capsule may represent a small remote lacunar infarct. 4. Minimal chronic left maxillary and ethmoid sinusitis.     I reviewed the patient's CPAP compliance data from 12/07/2013 to 01/05/2014, which is a total of 30 days, during which time the patient used CPAP every day. The average usage for all days was 8 hours and 50 minutes. The percent used days greater than 4 hours was 100 %, indicating superb compliance. The residual AHI was 2.3 per hour, indicating an adequate treatment pressure of 8 cwp with EPR of 1. Air leak from the mask was low at 13.4 L per minute at the 95th percentile.  I reviewed his compliance from 03/10/2014 to 04/08/2014 which is a total of 30 days during which time he used his machine every night with 100% compliance. Average usage of 10 hours and 34 minutes, pressure at 8 cm with EPR of 1. Residual AHI low at 1.6 per hour, leak low 8.6 L per minute for the 95th percentile.     I first met him on 08/22/2013, at which time he  reported a recent abnormal overnight pulse oximetry test, which showed periods of recurrent desaturations as low as 62%. He reported snoring and apneic pauses while asleep and waking up with a choking or gasping sensation. He was started on oxygen and reported sleeping much better since then. He has a Hx of prolonged bronchitis some 4 years ago (after he took the flu shot, he says), and was treated with ABx and steroids. He did not take the flu shot after that. He has been on Ambien or Xanax at night. His urologist has recommended a sleep study. He has nasal congestion, and uses OTC afrin. I requested that he return for a sleep study. He had a split-night sleep  study in on 08/29/13: His baseline sleep efficiency was reduced at 80.6% with a latency to sleep of 16 minutes and wake after sleep onset of 14 minutes with moderate sleep fragmentation noted. He had an increased percentage of light stage sleep and absence of slow-wave and REM sleep. He had moderate to loud snoring. His total AHI was 60.7 per hour. Baseline oxygen saturation was only 89% with a nadir of 76%. Time below 88% saturation was 50 minutes and 24 seconds. He was then started on CPAP. Sleep efficiency was markedly increased at 96.7%. He had an improved arousal index. He had a normal percentage of light stage sleep, absence of slow-wave sleep and an increased percentage of REM sleep at 36.5% with a very low REM latency. He was titrated using a medium nasal pillows mask starting at 5 cm with a final pressure of 8 cm and reduction of his AHI to 1.3 events per hour at a pressure with supine REM sleep achieved. The average oxygen saturation with CPAP was 92% with a nadir of 82%. Time below 88% saturation with CPAP was 2 minutes and 19 seconds. I placed him on CPAP therapy after that.   I reviewed his compliance data from 09/06/2013 through 10/11/2013 which is the last 36 days during which time he uses CPAP every night with a percent used days greater than 4  hours of 94%, indicating excellent compliance, average usage was 8 hours and 7 minutes, residual AHI at 2.6 with a very low leak. Pressure is 8 with EPR of 1.   His Past Medical History Is Significant For: Past Medical History:  Diagnosis Date  . Bronchitis   . DJD (degenerative joint disease)   . Glaucoma   . History of tobacco abuse 10/23/2011   History of smoking for 10 years, a pack per day, quitting at age 67.  . IBS (irritable bowel syndrome)   . Leukopenia 10/23/2011   Decrease in lymphocytes dating back to 1995.  . PUD (peptic ulcer disease) Jan 2017   endoscopy  . Renal calculi   . Sleep apnea    on C-pap    His Past Surgical History Is Significant For: Past Surgical History:  Procedure Laterality Date  . APPENDECTOMY    . CARDIAC CATHETERIZATION N/A 11/27/2015   Procedure: Left Heart Cath and Coronary Angiography;  Surgeon: Belva Crome, MD;  Location: [facility];  Service: Cardiovascular;  Laterality: N/A;  . COLONOSCOPY N/A 09/01/2012   Procedure: COLONOSCOPY;  Surgeon: [name], MD;  Location: [facility];  Service: Endoscopy;  Laterality: N/A;  1030  . CYSTOGRAPHY    . ESOPHAGOGASTRODUODENOSCOPY N/A 07/12/2015   Procedure: ESOPHAGOGASTRODUODENOSCOPY (EGD);  Surgeon: [name], MD;  Location: [facility];  Service: Endoscopy;  Laterality: N/A;  2:50 - moved to 1:00 - Ann to notify  . REFRACTIVE SURGERY     "to relieve glaucoma"  . ROTATOR CUFF REPAIR     bilateral shoulders  . SP FL GUIDE SPINAL INJ  May 2017    His Family History Is Significant For: Family History  Problem Relation Age of Onset  . Breast cancer Mother   . Kidney Stones Brother   . Hypertension Mother     His Social History Is Significant For: Social History   Social History  . Marital status: Divorced    Spouse name: N/A  . Number of children: 1  . Years of education: college   Occupational History  . retired  Social History Main Topics  . Smoking status:  Former Smoker    Packs/day: 2.00    Years: 15.00    Types: Cigarettes    Quit date: 07/01/1983  . Smokeless tobacco: Never Used  . Alcohol use 0.0 oz/week     Comment: occasional  . Drug use: No  . Sexual activity: Not Asked   Other Topics Concern  . None   Social History Narrative  . None    His Allergies Are:  Allergies  Allergen Reactions  . Clams [Shellfish Allergy] Nausea And Vomiting  . Tetracyclines & Related Rash  . Tylenol [Acetaminophen] Rash  :   His Current Medications Are:  Outpatient Encounter Prescriptions as of 04/23/2016  Medication Sig  . ALPRAZolam (XANAX) 1 MG tablet Take 1 mg by mouth 2 (two) times daily as needed for anxiety or sleep.   Marland Kitchen aspirin EC 81 MG tablet Take 81 mg by mouth daily.  . Cholecalciferol (VITAMIN D3) 5000 units TABS Take 5,000 Units by mouth daily.  . fluticasone (FLONASE) 50 MCG/ACT nasal spray INHALE 1 SPRAYS IN EACH NOSTRIL ONCE DAILY. (Patient taking differently: Place 1 spray into both nostrils daily as needed for allergies or rhinitis. INHALE 1 SPRAYS IN EACH NOSTRIL ONCE DAILY)  . ibuprofen (ADVIL,MOTRIN) 200 MG tablet Take 400 mg by mouth every 6 (six) hours as needed (pain).  . metoprolol tartrate (LOPRESSOR) 25 MG tablet Take 0.5 tablets (12.5 mg total) by mouth 2 (two) times daily.  . Misc Natural Products (COLON CLEANSE PO) Take 1 capsule by mouth daily as needed (constipation).  . Multiple Vitamin (MULITIVITAMIN WITH MINERALS) TABS Take 1 tablet by mouth daily.  . mupirocin ointment (BACTROBAN) 2 % Place 1 application into the nose at bedtime. For dryness from CPAP  . pantoprazole (PROTONIX) 40 MG tablet TAKE (1) TABLET BY MOUTH TWICE A DAY BEFORE MEALS. (BREAKFAST AND SUPPER).  Vladimir Faster Glycol-Propyl Glycol (SYSTANE OP) Place 1 drop into both eyes daily as needed (seasonal allergies/ dry eyes).  Marland Kitchen PRESCRIPTION MEDICATION Inhale into the lungs at bedtime. CPAP  . Probiotic Product (PROBIOTIC PO) Take 1 tablet by mouth  daily.  . tamsulosin (FLOMAX) 0.4 MG CAPS capsule Take 0.4 mg by mouth daily after breakfast.   . venlafaxine XR (EFFEXOR-XR) 37.5 MG 24 hr capsule TAKE 1 CAPSULE ONCE DAILY WITH BREAKFAST.  Marland Kitchen zolpidem (AMBIEN CR) 12.5 MG CR tablet Take 1 tablet (12.5 mg total) by mouth at bedtime as needed for sleep.  . [DISCONTINUED] silver sulfADIAZINE (SILVADENE) 1 % cream Apply 1 application topically daily as needed (wound care).  . [DISCONTINUED] cyclobenzaprine (FLEXERIL) 10 MG tablet Take 1 tablet (10 mg total) by mouth 3 (three) times daily as needed for muscle spasms. (Patient taking differently: Take 10 mg by mouth 3 (three) times daily. )  . [DISCONTINUED] dicyclomine (BENTYL) 10 MG capsule TAKE 1 CAPSULE 3 TIMES DAILY. (Patient not taking: Reported on 11/25/2015)  . [DISCONTINUED] Oxycodone HCl 10 MG TABS Take 10 mg by mouth every 4 (four) hours as needed (pain).    No facility-administered encounter medications on file as of 04/23/2016.   :  Review of Systems:  Out of a complete 14 point review of systems, all are reviewed and negative with the exception of these symptoms as listed below:  Review of Systems  Neurological:       Patient states that he was using nasal pillows with CPAP. He reports going through a period to where he was breathing out of his  mouth. Patient tried a different kind of mask but did not work well for him. He is back to nasal pillows and has stopped mouth breathing.     Objective:  Neurologic Exam  Physical Exam Physical Examination:   Vitals:   04/23/16 1143  BP: 138/70  Pulse: 72  Resp: 16    General Examination: The patient is a very pleasant 65 y.o. male in no acute distress. He appears well-developed and well-nourished and well groomed.He is in good spirits today.   HEENT: Normocephalic, atraumatic, pupils are equal, round and reactive to light and accommodation. He has bilateral cataracts. Extraocular tracking is good without limitation to gaze excursion  or nystagmus noted. Normal smooth pursuit is noted. Hearing is grossly intact. Face is symmetric with normal facial animation and normal facial sensation. Speech is clear with no dysarthria noted. There is no hypophonia. There is no lip, neck/head, jaw or voice tremor. Neck is supple with full range of passive and active motion. There are no carotid bruits on auscultation. Oropharynx exam reveals: Mild mouth dryness, mild posterior pharynx erythema, adequate dental hygiene and moderate airway crowding, due to thick soft palate and swollen uvula. Mallampati is class II. Tongue protrudes centrally and palate elevates symmetrically. Tonsils are 1+ in size.   Chest: Clear to auscultation without wheezing, rhonchi or crackles noted.  Heart: S1+S2+0, regular and normal without murmurs, rubs or gallops noted.   Abdomen: Soft, non-tender and non-distended with normal bowel sounds appreciated on auscultation.  Extremities: There is no pitting edema in the distal lower extremities bilaterally. Pedal pulses are intact.  Skin: Warm and dry without trophic changes noted. There are no varicose veins.  Musculoskeletal: exam reveals no obvious joint deformities, tenderness or joint swelling or erythema.   Neurologically:  Mental status: The patient is awake, alert and oriented in all 4 spheres. His immediate and remote memory, attention, language skills and fund of knowledge are appropriate. There is no evidence of aphasia, agnosia, apraxia or anomia. Speech is clear with normal prosody and enunciation. Thought process is linear. Mood is normal and affect is normal.  Cranial nerves II - XII are as described above under HEENT exam. In addition: shoulder shrug is normal with equal shoulder height noted. Motor exam: Normal bulk, strength and tone is noted. There is no drift, tremor or rebound. Romberg is negative. Reflexes are 2+ throughout. Fine motor skills and coordination: intact in the UEs and LEs.  Sensory  exam: intact to light touch in the upper and lower extremities.  Gait, station and balance: He stands easily. No veering to one side is noted. No leaning to one side is noted. Posture is age-appropriate and stance is narrow based. Gait shows normal stride length and normal pace. No problems turning are noted. Tandem walk is unremarkable.   Assessment and Plan:   In summary, Joel Greene is a very pleasant 65 year old male with an underlying medical history of depression and anxiety, recurrent UTIs, remote Hx of smoking, mild obesity, and nephrolithiasis, who presents for followup consultation of his severe obstructive sleep apnea, treated well with CPAP at a pressure of 8 cwp with typically full compliance. He had a recent lapse in treatment because of issues with mouth breathing which subsided and he also forgot his CPAP machine when he went on a recent vacation trip. He had right shoulder rotator cuff repair under Dr. Noemi Chapel. He has done excellent after that. He has had issues with sleep maintenance. He had increase in stress  lately. He had tried Lexapro in the past but this caused side effects including flare up of insomnia. He was on generic Ambien as needed through his primary care physician and we switched him to Ambien CR 12.5 mg as needed, generic. He has been using this more successfully. He is reminded not to use this every night. Furthermore, for his depression with anxiety I suggested Effexor XR 37.5 mg daily and he has been using this successfully and reports improved mood. Physical examination neurological exam are stable. He is encouraged to continue with CPAP at the current setting of 8 cm with full compliance. I recently renewed his prescriptions for Ambien CR and Effexor XR and he did not need a refill today. He is advised to follow-up in one year, sooner as needed. I answered all his questions today and he was in agreement. Most of my 25 minute visit today was spent in counseling and  coordination of care, reviewing test results and reviewing medications and the Dx of OSA and depression, the prognosis and Rx options.

## 2016-04-23 NOTE — Patient Instructions (Signed)
Please continue using your CPAP regularly. While your insurance requires that you use CPAP at least 4 hours each night on 70% of the nights, I recommend, that you not skip any nights and use it throughout the night if you can. Getting used to CPAP and staying with the treatment long term does take time and patience and discipline. Untreated obstructive sleep apnea when it is moderate to severe can have an adverse impact on cardiovascular health and raise her risk for heart disease, arrhythmias, hypertension, congestive heart failure, stroke and diabetes. Untreated obstructive sleep apnea causes sleep disruption, nonrestorative sleep, and sleep deprivation. This can have an impact on your day to day functioning and cause daytime sleepiness and impairment of cognitive function, memory loss, mood disturbance, and problems focussing. Using CPAP regularly can improve these symptoms.  Keep up the good work! I will see you back in 12 months for sleep apnea check up.   We will continue with as needed Ambien CR and daily Effexor XR 37.5 mg.

## 2016-05-08 ENCOUNTER — Other Ambulatory Visit (HOSPITAL_COMMUNITY): Payer: Self-pay | Admitting: Internal Medicine

## 2016-05-08 DIAGNOSIS — R32 Unspecified urinary incontinence: Secondary | ICD-10-CM | POA: Diagnosis not present

## 2016-05-08 DIAGNOSIS — R1011 Right upper quadrant pain: Secondary | ICD-10-CM | POA: Diagnosis not present

## 2016-05-08 DIAGNOSIS — R109 Unspecified abdominal pain: Secondary | ICD-10-CM | POA: Diagnosis not present

## 2016-05-08 DIAGNOSIS — Z1389 Encounter for screening for other disorder: Secondary | ICD-10-CM | POA: Diagnosis not present

## 2016-05-08 DIAGNOSIS — Z6828 Body mass index (BMI) 28.0-28.9, adult: Secondary | ICD-10-CM | POA: Diagnosis not present

## 2016-05-08 DIAGNOSIS — M5414 Radiculopathy, thoracic region: Secondary | ICD-10-CM | POA: Diagnosis not present

## 2016-05-08 DIAGNOSIS — N200 Calculus of kidney: Secondary | ICD-10-CM | POA: Diagnosis not present

## 2016-05-08 DIAGNOSIS — G4709 Other insomnia: Secondary | ICD-10-CM | POA: Diagnosis not present

## 2016-05-14 ENCOUNTER — Other Ambulatory Visit (INDEPENDENT_AMBULATORY_CARE_PROVIDER_SITE_OTHER): Payer: Self-pay | Admitting: Internal Medicine

## 2016-05-15 ENCOUNTER — Ambulatory Visit (HOSPITAL_COMMUNITY)
Admission: RE | Admit: 2016-05-15 | Discharge: 2016-05-15 | Disposition: A | Discharge status: Home / Self Care | Payer: Medicare Other | Source: Ambulatory Visit | Attending: Internal Medicine | Admitting: Internal Medicine

## 2016-05-15 DIAGNOSIS — N200 Calculus of kidney: Secondary | ICD-10-CM | POA: Diagnosis not present

## 2016-05-15 DIAGNOSIS — K760 Fatty (change of) liver, not elsewhere classified: Secondary | ICD-10-CM | POA: Insufficient documentation

## 2016-05-15 DIAGNOSIS — R1011 Right upper quadrant pain: Secondary | ICD-10-CM | POA: Diagnosis not present

## 2016-05-20 ENCOUNTER — Other Ambulatory Visit (HOSPITAL_COMMUNITY)
Admission: RE | Admit: 2016-05-20 | Discharge: 2016-05-20 | Disposition: A | Discharge status: Home / Self Care | Payer: Medicare Other | Source: Ambulatory Visit | Attending: Urology | Admitting: Urology

## 2016-05-20 ENCOUNTER — Ambulatory Visit (INDEPENDENT_AMBULATORY_CARE_PROVIDER_SITE_OTHER): Payer: Medicare Other | Admitting: Urology

## 2016-05-20 DIAGNOSIS — N3281 Overactive bladder: Secondary | ICD-10-CM

## 2016-05-20 DIAGNOSIS — N200 Calculus of kidney: Secondary | ICD-10-CM | POA: Insufficient documentation

## 2016-05-20 DIAGNOSIS — N401 Enlarged prostate with lower urinary tract symptoms: Secondary | ICD-10-CM

## 2016-05-30 LAB — STONE ANALYSIS
CA OXALATE, MONOHYDR.: 85 %
CA PHOS CRY STONE QL IR: 10 %
Ca Oxalate,Dihydrate: 5 %
Stone Weight KSTONE: 8 mg

## 2016-06-02 ENCOUNTER — Other Ambulatory Visit (HOSPITAL_COMMUNITY): Payer: Self-pay | Admitting: Internal Medicine

## 2016-06-02 DIAGNOSIS — R109 Unspecified abdominal pain: Secondary | ICD-10-CM

## 2016-06-04 DIAGNOSIS — N200 Calculus of kidney: Secondary | ICD-10-CM | POA: Diagnosis not present

## 2016-06-04 DIAGNOSIS — M5414 Radiculopathy, thoracic region: Secondary | ICD-10-CM | POA: Diagnosis not present

## 2016-06-04 DIAGNOSIS — M47812 Spondylosis without myelopathy or radiculopathy, cervical region: Secondary | ICD-10-CM | POA: Diagnosis not present

## 2016-06-05 ENCOUNTER — Encounter (HOSPITAL_COMMUNITY)
Admission: RE | Admit: 2016-06-05 | Discharge: 2016-06-05 | Disposition: A | Discharge status: Home / Self Care | Payer: Medicare Other | Source: Ambulatory Visit | Attending: Internal Medicine | Admitting: Internal Medicine

## 2016-06-05 ENCOUNTER — Encounter (HOSPITAL_COMMUNITY): Payer: Self-pay

## 2016-06-05 DIAGNOSIS — R109 Unspecified abdominal pain: Secondary | ICD-10-CM | POA: Insufficient documentation

## 2016-06-05 MED ORDER — TECHNETIUM TC 99M MEBROFENIN IV KIT
5.0000 | PACK | Freq: Once | INTRAVENOUS | Status: AC | PRN
  Administered 2016-06-05: 5.4 via INTRAVENOUS

## 2016-06-05 MED ORDER — SODIUM CHLORIDE 0.9% FLUSH
INTRAVENOUS | Status: AC
  Filled 2016-06-05: qty 240

## 2016-06-11 ENCOUNTER — Other Ambulatory Visit: Payer: Self-pay | Admitting: Urology

## 2016-06-11 ENCOUNTER — Ambulatory Visit (HOSPITAL_COMMUNITY)
Admission: RE | Admit: 2016-06-11 | Discharge: 2016-06-11 | Disposition: A | Discharge status: Home / Self Care | Payer: Medicare Other | Source: Ambulatory Visit | Attending: Urology | Admitting: Urology

## 2016-06-11 DIAGNOSIS — N200 Calculus of kidney: Secondary | ICD-10-CM | POA: Diagnosis not present

## 2016-06-11 DIAGNOSIS — H401121 Primary open-angle glaucoma, left eye, mild stage: Secondary | ICD-10-CM | POA: Diagnosis not present

## 2016-06-11 DIAGNOSIS — H401112 Primary open-angle glaucoma, right eye, moderate stage: Secondary | ICD-10-CM | POA: Diagnosis not present

## 2016-07-03 ENCOUNTER — Other Ambulatory Visit (HOSPITAL_COMMUNITY): Payer: Self-pay | Admitting: Internal Medicine

## 2016-07-03 DIAGNOSIS — N200 Calculus of kidney: Secondary | ICD-10-CM | POA: Diagnosis not present

## 2016-07-03 DIAGNOSIS — M47814 Spondylosis without myelopathy or radiculopathy, thoracic region: Secondary | ICD-10-CM | POA: Diagnosis not present

## 2016-07-03 DIAGNOSIS — Z6828 Body mass index (BMI) 28.0-28.9, adult: Secondary | ICD-10-CM | POA: Diagnosis not present

## 2016-07-03 DIAGNOSIS — M5414 Radiculopathy, thoracic region: Secondary | ICD-10-CM

## 2016-07-03 DIAGNOSIS — M546 Pain in thoracic spine: Secondary | ICD-10-CM | POA: Diagnosis not present

## 2016-07-03 DIAGNOSIS — Z1389 Encounter for screening for other disorder: Secondary | ICD-10-CM | POA: Diagnosis not present

## 2016-07-03 DIAGNOSIS — K219 Gastro-esophageal reflux disease without esophagitis: Secondary | ICD-10-CM | POA: Diagnosis not present

## 2016-07-03 DIAGNOSIS — E663 Overweight: Secondary | ICD-10-CM | POA: Diagnosis not present

## 2016-07-03 DIAGNOSIS — I2510 Atherosclerotic heart disease of native coronary artery without angina pectoris: Secondary | ICD-10-CM | POA: Diagnosis not present

## 2016-07-04 DIAGNOSIS — Z1389 Encounter for screening for other disorder: Secondary | ICD-10-CM | POA: Diagnosis not present

## 2016-07-04 DIAGNOSIS — E559 Vitamin D deficiency, unspecified: Secondary | ICD-10-CM | POA: Diagnosis not present

## 2016-07-04 DIAGNOSIS — R5383 Other fatigue: Secondary | ICD-10-CM | POA: Diagnosis not present

## 2016-07-04 DIAGNOSIS — E538 Deficiency of other specified B group vitamins: Secondary | ICD-10-CM | POA: Diagnosis not present

## 2016-07-08 ENCOUNTER — Ambulatory Visit (INDEPENDENT_AMBULATORY_CARE_PROVIDER_SITE_OTHER): Payer: Medicare Other | Admitting: Urology

## 2016-07-08 ENCOUNTER — Ambulatory Visit (HOSPITAL_COMMUNITY)
Admission: RE | Admit: 2016-07-08 | Discharge: 2016-07-08 | Disposition: A | Discharge status: Home / Self Care | Payer: Medicare Other | Source: Ambulatory Visit | Attending: Internal Medicine | Admitting: Internal Medicine

## 2016-07-08 DIAGNOSIS — R3915 Urgency of urination: Secondary | ICD-10-CM

## 2016-07-08 DIAGNOSIS — M5414 Radiculopathy, thoracic region: Secondary | ICD-10-CM | POA: Diagnosis present

## 2016-07-08 DIAGNOSIS — N200 Calculus of kidney: Secondary | ICD-10-CM | POA: Diagnosis not present

## 2016-07-08 DIAGNOSIS — M5124 Other intervertebral disc displacement, thoracic region: Secondary | ICD-10-CM | POA: Diagnosis not present

## 2016-07-08 DIAGNOSIS — N3281 Overactive bladder: Secondary | ICD-10-CM

## 2016-07-08 DIAGNOSIS — M5114 Intervertebral disc disorders with radiculopathy, thoracic region: Secondary | ICD-10-CM | POA: Insufficient documentation

## 2016-07-10 DIAGNOSIS — H401134 Primary open-angle glaucoma, bilateral, indeterminate stage: Secondary | ICD-10-CM | POA: Diagnosis not present

## 2016-07-31 ENCOUNTER — Other Ambulatory Visit: Payer: Self-pay | Admitting: Neurology

## 2016-07-31 DIAGNOSIS — G4733 Obstructive sleep apnea (adult) (pediatric): Secondary | ICD-10-CM

## 2016-07-31 DIAGNOSIS — Z9989 Dependence on other enabling machines and devices: Secondary | ICD-10-CM

## 2016-07-31 DIAGNOSIS — G4730 Sleep apnea, unspecified: Principal | ICD-10-CM

## 2016-07-31 DIAGNOSIS — G4700 Insomnia, unspecified: Secondary | ICD-10-CM

## 2016-08-12 ENCOUNTER — Telehealth: Payer: Self-pay

## 2016-08-12 NOTE — Telephone Encounter (Signed)
PA for Lorrin Mais has been completed and sent to [facility]. Should have a determination in 1-3 business days.

## 2016-08-12 NOTE — Telephone Encounter (Signed)
[employer] effective 06/30/16 ID# [id number] RX GRP# [id number] RX BIN# [id number]

## 2016-08-12 NOTE — Telephone Encounter (Signed)
I received a notice from [facility] that a pa is required for pt's ambien. I do not see that we have [place] insurance on file, and I need pt's [employer] ID. I called pt to discuss. No answer, left a message asking him to call me back.

## 2016-08-14 NOTE — Telephone Encounter (Signed)
[employer] approved ambien until 06/29/2017.  [facility] notified.

## 2016-08-26 DIAGNOSIS — M5414 Radiculopathy, thoracic region: Secondary | ICD-10-CM | POA: Diagnosis not present

## 2016-08-27 ENCOUNTER — Other Ambulatory Visit: Payer: Self-pay

## 2016-08-27 DIAGNOSIS — G4730 Sleep apnea, unspecified: Principal | ICD-10-CM

## 2016-08-27 DIAGNOSIS — G4700 Insomnia, unspecified: Secondary | ICD-10-CM

## 2016-08-27 DIAGNOSIS — Z9989 Dependence on other enabling machines and devices: Secondary | ICD-10-CM

## 2016-08-27 DIAGNOSIS — G4733 Obstructive sleep apnea (adult) (pediatric): Secondary | ICD-10-CM

## 2016-08-27 MED ORDER — ZOLPIDEM TARTRATE ER 12.5 MG PO TBCR: 12.5000 mg | EXTENDED_RELEASE_TABLET | Freq: Every evening | ORAL | 3 refills | Status: DC | PRN

## 2016-08-27 NOTE — Telephone Encounter (Signed)
RX for [facility] [facility] [employer] signed by Dr. Rexene Alberts, faxed to [facility]. Received a receipt of confirmation.

## 2016-08-27 NOTE — Telephone Encounter (Signed)
Pt is up to date on apps and is due for a refill on his ambien. Will print and give RX to Dr. Rexene Alberts to sign.

## 2016-09-04 DIAGNOSIS — H401134 Primary open-angle glaucoma, bilateral, indeterminate stage: Secondary | ICD-10-CM | POA: Diagnosis not present

## 2016-09-25 ENCOUNTER — Other Ambulatory Visit: Payer: Self-pay | Admitting: Neurology

## 2016-09-25 DIAGNOSIS — F329 Major depressive disorder, single episode, unspecified: Secondary | ICD-10-CM

## 2016-09-25 DIAGNOSIS — F32A Depression, unspecified: Secondary | ICD-10-CM

## 2016-09-25 DIAGNOSIS — F419 Anxiety disorder, unspecified: Secondary | ICD-10-CM

## 2016-10-07 DIAGNOSIS — M47892 Other spondylosis, cervical region: Secondary | ICD-10-CM | POA: Diagnosis not present

## 2016-10-07 DIAGNOSIS — M47812 Spondylosis without myelopathy or radiculopathy, cervical region: Secondary | ICD-10-CM | POA: Diagnosis not present

## 2016-10-07 DIAGNOSIS — M5414 Radiculopathy, thoracic region: Secondary | ICD-10-CM | POA: Diagnosis not present

## 2016-11-12 DIAGNOSIS — M25561 Pain in right knee: Secondary | ICD-10-CM | POA: Diagnosis not present

## 2016-11-12 DIAGNOSIS — M25562 Pain in left knee: Secondary | ICD-10-CM | POA: Diagnosis not present

## 2016-11-12 DIAGNOSIS — Z9229 Personal history of other drug therapy: Secondary | ICD-10-CM | POA: Diagnosis not present

## 2016-11-12 DIAGNOSIS — M170 Bilateral primary osteoarthritis of knee: Secondary | ICD-10-CM | POA: Diagnosis not present

## 2016-11-18 DIAGNOSIS — M1711 Unilateral primary osteoarthritis, right knee: Secondary | ICD-10-CM | POA: Diagnosis not present

## 2016-11-18 DIAGNOSIS — M25561 Pain in right knee: Secondary | ICD-10-CM | POA: Diagnosis not present

## 2016-12-02 DIAGNOSIS — M1712 Unilateral primary osteoarthritis, left knee: Secondary | ICD-10-CM | POA: Diagnosis not present

## 2016-12-02 DIAGNOSIS — M25562 Pain in left knee: Secondary | ICD-10-CM | POA: Diagnosis not present

## 2016-12-09 DIAGNOSIS — M170 Bilateral primary osteoarthritis of knee: Secondary | ICD-10-CM | POA: Diagnosis not present

## 2016-12-09 DIAGNOSIS — M25561 Pain in right knee: Secondary | ICD-10-CM | POA: Diagnosis not present

## 2016-12-09 DIAGNOSIS — M25562 Pain in left knee: Secondary | ICD-10-CM | POA: Diagnosis not present

## 2016-12-24 DIAGNOSIS — I10 Essential (primary) hypertension: Secondary | ICD-10-CM | POA: Diagnosis not present

## 2016-12-24 DIAGNOSIS — G894 Chronic pain syndrome: Secondary | ICD-10-CM | POA: Diagnosis not present

## 2016-12-24 DIAGNOSIS — F419 Anxiety disorder, unspecified: Secondary | ICD-10-CM | POA: Diagnosis not present

## 2016-12-24 DIAGNOSIS — Z6829 Body mass index (BMI) 29.0-29.9, adult: Secondary | ICD-10-CM | POA: Diagnosis not present

## 2016-12-24 DIAGNOSIS — Z1389 Encounter for screening for other disorder: Secondary | ICD-10-CM | POA: Diagnosis not present

## 2016-12-24 DIAGNOSIS — K219 Gastro-esophageal reflux disease without esophagitis: Secondary | ICD-10-CM | POA: Diagnosis not present

## 2017-01-08 ENCOUNTER — Other Ambulatory Visit: Payer: Self-pay

## 2017-01-08 DIAGNOSIS — G4730 Sleep apnea, unspecified: Principal | ICD-10-CM

## 2017-01-08 DIAGNOSIS — G4700 Insomnia, unspecified: Secondary | ICD-10-CM

## 2017-01-08 DIAGNOSIS — Z9989 Dependence on other enabling machines and devices: Secondary | ICD-10-CM

## 2017-01-08 DIAGNOSIS — G4733 Obstructive sleep apnea (adult) (pediatric): Secondary | ICD-10-CM

## 2017-01-08 MED ORDER — ZOLPIDEM TARTRATE ER 12.5 MG PO TBCR: 12.5000 mg | EXTENDED_RELEASE_TABLET | Freq: Every evening | ORAL | 3 refills | Status: DC | PRN

## 2017-01-08 NOTE — Telephone Encounter (Signed)
RX for [employer] signed by Dr. Rexene Alberts. Faxed RX for [employer] [facility] [employer] to [facility]. Received a receipt of confirmation.

## 2017-01-08 NOTE — Telephone Encounter (Signed)
Received a fax from [facility] requesting a refill on pt's ambien. Pt is due for a refill and up to date on his appts. Will print RX and give to Dr. Rexene Alberts for review.

## 2017-01-12 DIAGNOSIS — Z0000 Encounter for general adult medical examination without abnormal findings: Secondary | ICD-10-CM | POA: Diagnosis not present

## 2017-01-12 DIAGNOSIS — Z6830 Body mass index (BMI) 30.0-30.9, adult: Secondary | ICD-10-CM | POA: Diagnosis not present

## 2017-01-12 DIAGNOSIS — E6609 Other obesity due to excess calories: Secondary | ICD-10-CM | POA: Diagnosis not present

## 2017-01-19 DIAGNOSIS — M47812 Spondylosis without myelopathy or radiculopathy, cervical region: Secondary | ICD-10-CM | POA: Diagnosis not present

## 2017-01-19 DIAGNOSIS — M5414 Radiculopathy, thoracic region: Secondary | ICD-10-CM | POA: Diagnosis not present

## 2017-01-19 DIAGNOSIS — M47892 Other spondylosis, cervical region: Secondary | ICD-10-CM | POA: Diagnosis not present

## 2017-03-12 DIAGNOSIS — M25561 Pain in right knee: Secondary | ICD-10-CM | POA: Diagnosis not present

## 2017-03-12 DIAGNOSIS — M170 Bilateral primary osteoarthritis of knee: Secondary | ICD-10-CM | POA: Diagnosis not present

## 2017-03-12 DIAGNOSIS — R262 Difficulty in walking, not elsewhere classified: Secondary | ICD-10-CM | POA: Diagnosis not present

## 2017-03-12 DIAGNOSIS — M25562 Pain in left knee: Secondary | ICD-10-CM | POA: Diagnosis not present

## 2017-03-19 DIAGNOSIS — M25561 Pain in right knee: Secondary | ICD-10-CM | POA: Diagnosis not present

## 2017-03-19 DIAGNOSIS — M25562 Pain in left knee: Secondary | ICD-10-CM | POA: Diagnosis not present

## 2017-03-19 DIAGNOSIS — M170 Bilateral primary osteoarthritis of knee: Secondary | ICD-10-CM | POA: Diagnosis not present

## 2017-03-26 DIAGNOSIS — M170 Bilateral primary osteoarthritis of knee: Secondary | ICD-10-CM | POA: Diagnosis not present

## 2017-03-26 DIAGNOSIS — M25562 Pain in left knee: Secondary | ICD-10-CM | POA: Diagnosis not present

## 2017-03-26 DIAGNOSIS — M25561 Pain in right knee: Secondary | ICD-10-CM | POA: Diagnosis not present

## 2017-03-31 DIAGNOSIS — M25561 Pain in right knee: Secondary | ICD-10-CM | POA: Diagnosis not present

## 2017-03-31 DIAGNOSIS — M25562 Pain in left knee: Secondary | ICD-10-CM | POA: Diagnosis not present

## 2017-03-31 DIAGNOSIS — M170 Bilateral primary osteoarthritis of knee: Secondary | ICD-10-CM | POA: Diagnosis not present

## 2017-04-08 DIAGNOSIS — H401132 Primary open-angle glaucoma, bilateral, moderate stage: Secondary | ICD-10-CM | POA: Diagnosis not present

## 2017-04-09 DIAGNOSIS — M25561 Pain in right knee: Secondary | ICD-10-CM | POA: Diagnosis not present

## 2017-04-09 DIAGNOSIS — M25562 Pain in left knee: Secondary | ICD-10-CM | POA: Diagnosis not present

## 2017-04-09 DIAGNOSIS — M170 Bilateral primary osteoarthritis of knee: Secondary | ICD-10-CM | POA: Diagnosis not present

## 2017-04-21 DIAGNOSIS — M47812 Spondylosis without myelopathy or radiculopathy, cervical region: Secondary | ICD-10-CM | POA: Diagnosis not present

## 2017-04-21 DIAGNOSIS — M5414 Radiculopathy, thoracic region: Secondary | ICD-10-CM | POA: Diagnosis not present

## 2017-04-21 DIAGNOSIS — M47892 Other spondylosis, cervical region: Secondary | ICD-10-CM | POA: Diagnosis not present

## 2017-04-23 ENCOUNTER — Ambulatory Visit (INDEPENDENT_AMBULATORY_CARE_PROVIDER_SITE_OTHER): Payer: Medicare Other | Admitting: Neurology

## 2017-04-23 ENCOUNTER — Encounter: Payer: Self-pay | Admitting: Neurology

## 2017-04-23 VITALS — BP 146/78 | HR 62 | Ht 71.0 in | Wt 199.0 lb

## 2017-04-23 DIAGNOSIS — Z9989 Dependence on other enabling machines and devices: Secondary | ICD-10-CM

## 2017-04-23 DIAGNOSIS — F419 Anxiety disorder, unspecified: Secondary | ICD-10-CM | POA: Diagnosis not present

## 2017-04-23 DIAGNOSIS — G4733 Obstructive sleep apnea (adult) (pediatric): Secondary | ICD-10-CM

## 2017-04-23 DIAGNOSIS — G4730 Sleep apnea, unspecified: Secondary | ICD-10-CM | POA: Diagnosis not present

## 2017-04-23 DIAGNOSIS — G4700 Insomnia, unspecified: Secondary | ICD-10-CM | POA: Diagnosis not present

## 2017-04-23 MED ORDER — FLUTICASONE PROPIONATE 50 MCG/ACT NA SUSP: 2.0000 | Freq: Every day | NASAL | 3 refills | Status: DC

## 2017-04-23 MED ORDER — ZOLPIDEM TARTRATE 10 MG PO TABS: 10.0000 mg | ORAL_TABLET | Freq: Every evening | ORAL | 5 refills | Status: DC | PRN

## 2017-04-23 MED ORDER — VENLAFAXINE HCL ER 75 MG PO CP24: 75.0000 mg | ORAL_CAPSULE | Freq: Every day | ORAL | 5 refills | Status: DC

## 2017-04-23 NOTE — Patient Instructions (Addendum)
Please continue using your CPAP regularly. While your insurance requires that you use CPAP at least 4 hours each night on 70% of the nights, I recommend, that you not skip any nights and use it throughout the night if you can. Getting used to CPAP and staying with the treatment long term does take time and patience and discipline. Untreated obstructive sleep apnea when it is moderate to severe can have an adverse impact on cardiovascular health and raise her risk for heart disease, arrhythmias, hypertension, congestive heart failure, stroke and diabetes. Untreated obstructive sleep apnea causes sleep disruption, nonrestorative sleep, and sleep deprivation. This can have an impact on your day to day functioning and cause daytime sleepiness and impairment of cognitive function, memory loss, mood disturbance, and problems focussing. Using CPAP regularly can improve these symptoms. You have been doing well with your CPAP.  We will switch your Ambien CR to Ambien regular release 10 mg at night  as needed for sleep.  As discussed, we will increase your Effexor XR to 75 mg once daily from 37.5 mg.  I can see you routinely in one year.

## 2017-04-23 NOTE — Progress Notes (Signed)
Subjective:    Patient ID: Joel Greene is a 66 y.o. male.  HPI     Interim history:   Joel Greene is a 66 year old right-handed gentleman with an underlying medical history of anxiety, recurrent UTIs, remote Hx of smoking, mild obesity, and nephrolithiasis, who presents for followup consultation of his obstructive sleep apnea, treated with CPAP. He is unaccompanied today. I last saw him on 04/23/2016, at which time he reported doing well. He had started Effexor which had helped. Stress level was better. He was still taking Ambien CR as needed, up to 4 times a week. He had recently forgotten to take his CPAP machine on a trip but otherwise was compliant with treatment. He had a recent workup for chest pain, negative for heart attack. I suggested a one-year follow-up appointment for sleep apnea.  Today, 04/23/2017: I reviewed his CPAP compliance data from 03/23/2017 through 04/21/2017 which is a total of 30 days, during which time he used his CPAP 29 days with percent used days greater than 4 hours at 87%, indicating very good compliance with an average usage of 6 hours and 50 minutes, residual AHI 2.9 per hour, leak low with the 95th percentile at 4.6 L/m on a pressure of 8 cm. He reports doing okay. Needs to switch from Ambien CR to Ambien immediate release because of insurance change next year. He does not take it every night. Effexor XR has been working for him but he is wondering if this could be increased to the next dose as he does have some residual anxiety and stress at times. He has also been having more back pain. He is spine surgeon has prescribed gabapentin and Flexeril. He has been taking Flonase for nasal congestion. He needs refills on his medications. He has been compliant with CPAP. He has ongoing good results with it.  The patient's allergies, current medications, family history, past medical history, past social history, past surgical history and problem list were reviewed and  updated as appropriate.   Previously (copied from previous notes for reference):   I saw him on 10/17/2015, at which time he reported ongoing issues with sleep maintenance. He was compliant with CPAP therapy. [name] did not help and was also no longer covered by his insurance. He was on generic Ambien per primary care physician and had tried Ambien CR in the past. I did not recommend trazodone or temazepam or Ambien. He requested to try Ambien CR again and I prescribed this in generic form. He was having some issues with depression. He had had side effects with Lexapro in the past including insomnia. I suggested low-dose long-acting Effexor at 37.5 mg daily.    I reviewed his CPAP compliance data from 03/23/2016 through 04/21/2016 which is a total of 30 days, during which time he used his CPAP only 19 days with percent used days greater than 4 hours at 60% only, indicating suboptimal compliance with an average usage of 5 hours and 15 minutes, residual AHI 2.7 per hour, leaked low with the 95th percentile at 4.7 L/m on a pressure of 8 cm with EPR of 1.     I saw him on 10/10/2014, at which time he reported that he recently had rotator cuff surgery on the right, December 2015. He also saw Dr. Glenna Fellows for cervical spine disease. He was supposed to see Dr. Francesco Runner for neck injections. He was also referred to a hand surgeon for right hand Dupuytren's contracture. As far as his sleep is  concerned he was compliant with treatment with CPAP and continued to endorse good results in fact felt he could not sleep without it. For sleep maintenance and sleep initiation issues we had tried sonata and he requested a refill. He was using it about 4 times a week. He had to change his DME company after his insurance changed after he turned 63. He had some neck pain with radiation to the right.    I reviewed his CPAP compliance data from 09/16/2015 through 10/15/2015 which is a total of 30 days during which time he used his  machine 29 days with percent used days greater than 4 hours at 93%, indicating excellent compliance with an average usage of 8 hours and 45 minutes, residual AHI 2.8 per hour, leak low for the 95th percentile at 1.8 L/m on a pressure of 8 cm.    I saw him on 04/11/2014, at which time he reported that sleep maintenance was still an issue from time to time. He was using Ambien as needed. He felt it was not as helpful any longer. He was compliant with treatment. He had no residual symptoms after his recent fall from a ladder.    I reviewed his CPAP compliance data from 09/09/2014 through 10/08/2014 which is a total of 30 days during which time he used his machine every night with percent used days greater than 4 hours of 100%, indicating superb compliance with an average usage of 11 hours and 9 minutes and residual AHI low at 1.7 per hour, leak low with the 95th percentile at 1.4 L/m on a pressure of 8 cm without EPR.   I saw him on 10/12/2013, at which time we talked about his sleep study results from March. We also talked about his compliance data. He reported sleeping much better, feeling much less restless, and less sleepy during the day. Nocturia had improved. He did report some soreness in his left nostril due to the mask. He had been taking care of his mother's estate who passed away in 08/25/13. He did not have to use his oxygen at night once he was placed on CPAP therapy. In the interim, he presented to the emergency room on 11/18/2013 after falling off of ladder. He fell while cutting limbs from a tree and fell about 8 feet. He hurt his back and also sustained a laceration of the scrotum.   I reviewed the emergency room records and his imaging test results from 11/18/2013: Dg Thoracic Spine W/swimmers: Negative.   Dg Lumbar Spine Complete: No acute osseous abnormality of the lumbar spine. Dg Pelvis 1-2 Views: Negative.   Ct Head and cervical spine Wo Contrast: 1. No acute intracranial or cervical  spine findings. 2. Cervical spondylosis. 3. Small hypodensity in the right external capsule may represent a small remote lacunar infarct. 4. Minimal chronic left maxillary and ethmoid sinusitis.     I reviewed the patient's CPAP compliance data from 12/07/2013 to 01/05/2014, which is a total of 30 days, during which time the patient used CPAP every day. The average usage for all days was 8 hours and 50 minutes. The percent used days greater than 4 hours was 100 %, indicating superb compliance. The residual AHI was 2.3 per hour, indicating an adequate treatment pressure of 8 cwp with EPR of 1. Air leak from the mask was low at 13.4 L per minute at the 95th percentile.  I reviewed his compliance from 03/10/2014 to 04/08/2014 which is a total of 30 days  during which time he used his machine every night with 100% compliance. Average usage of 10 hours and 34 minutes, pressure at 8 cm with EPR of 1. Residual AHI low at 1.6 per hour, leak low 8.6 L per minute for the 95th percentile.     I first met him on 08/22/2013, at which time he reported a recent abnormal overnight pulse oximetry test, which showed periods of recurrent desaturations as low as 62%. He reported snoring and apneic pauses while asleep and waking up with a choking or gasping sensation. He was started on oxygen and reported sleeping much better since then. He has a Hx of prolonged bronchitis some 4 years ago (after he took the flu shot, he says), and was treated with ABx and steroids. He did not take the flu shot after that. He has been on Ambien or Xanax at night. His urologist has recommended a sleep study. He has nasal congestion, and uses OTC afrin. I requested that he return for a sleep study. He had a split-night sleep study in on 08/29/13: His baseline sleep efficiency was reduced at 80.6% with a latency to sleep of 16 minutes and wake after sleep onset of 14 minutes with moderate sleep fragmentation noted. He had an increased percentage of light  stage sleep and absence of slow-wave and REM sleep. He had moderate to loud snoring. His total AHI was 60.7 per hour. Baseline oxygen saturation was only 89% with a nadir of 76%. Time below 88% saturation was 50 minutes and 24 seconds. He was then started on CPAP. Sleep efficiency was markedly increased at 96.7%. He had an improved arousal index. He had a normal percentage of light stage sleep, absence of slow-wave sleep and an increased percentage of REM sleep at 36.5% with a very low REM latency. He was titrated using a medium nasal pillows mask starting at 5 cm with a final pressure of 8 cm and reduction of his AHI to 1.3 events per hour at a pressure with supine REM sleep achieved. The average oxygen saturation with CPAP was 92% with a nadir of 82%. Time below 88% saturation with CPAP was 2 minutes and 19 seconds. I placed him on CPAP therapy after that.   I reviewed his compliance data from 09/06/2013 through 10/11/2013 which is the last 36 days during which time he uses CPAP every night with a percent used days greater than 4 hours of 94%, indicating excellent compliance, average usage was 8 hours and 7 minutes, residual AHI at 2.6 with a very low leak. Pressure is 8 with EPR of 1.    His Past Medical History Is Significant For: Past Medical History:  Diagnosis Date  . Bronchitis   . DJD (degenerative joint disease)   . Glaucoma   . History of tobacco abuse 10/23/2011   History of smoking for 10 years, a pack per day, quitting at age 24.  . IBS (irritable bowel syndrome)   . Leukopenia 10/23/2011   Decrease in lymphocytes dating back to 1995.  . PUD (peptic ulcer disease) Jan 2017   endoscopy  . Renal calculi   . Sleep apnea    on C-pap    His Past Surgical History Is Significant For: Past Surgical History:  Procedure Laterality Date  . APPENDECTOMY    . CARDIAC CATHETERIZATION N/A 11/27/2015   Procedure: Left Heart Cath and Coronary Angiography;  Surgeon: Belva Crome, MD;  Location:  [facility];  Service: Cardiovascular;  Laterality: N/A;  .  COLONOSCOPY N/A 09/01/2012   Procedure: COLONOSCOPY;  Surgeon: [name], MD;  Location: [facility];  Service: Endoscopy;  Laterality: N/A;  1030  . CYSTOGRAPHY    . ESOPHAGOGASTRODUODENOSCOPY N/A 07/12/2015   Procedure: ESOPHAGOGASTRODUODENOSCOPY (EGD);  Surgeon: [name], MD;  Location: [facility];  Service: Endoscopy;  Laterality: N/A;  2:50 - moved to 1:00 - Ann to notify  . REFRACTIVE SURGERY     "to relieve glaucoma"  . ROTATOR CUFF REPAIR     bilateral shoulders  . SP FL GUIDE SPINAL INJ  May 2017    His Family History Is Significant For: Family History  Problem Relation Age of Onset  . Breast cancer Mother   . Kidney Stones Brother   . Hypertension Mother     His Social History Is Significant For: Social History   Social History  . Marital status: Divorced    Spouse name: N/A  . Number of children: 1  . Years of education: college   Occupational History  . retired    Social History Main Topics  . Smoking status: Former Smoker    Packs/day: 2.00    Years: 15.00    Types: Cigarettes    Quit date: 07/01/1983  . Smokeless tobacco: Never Used  . Alcohol use 0.0 oz/week     Comment: occasional  . Drug use: No  . Sexual activity: Not Asked   Other Topics Concern  . None   Social History Narrative  . None    His Allergies Are:  Allergies  Allergen Reactions  . Clams [Shellfish Allergy] Nausea And Vomiting  . Demeclocycline Rash  . Tetracyclines & Related Rash  . Tylenol [Acetaminophen] Rash  :   His Current Medications Are:  Outpatient Encounter Prescriptions as of 04/23/2017  Medication Sig  . ALPRAZolam (XANAX) 1 MG tablet Take 1 mg by mouth 2 (two) times daily as needed for anxiety or sleep.   Marland Kitchen aspirin EC 81 MG tablet Take 81 mg by mouth daily.  . Cholecalciferol (VITAMIN D3) 5000 units TABS Take 5,000 Units by mouth daily.  . cyclobenzaprine (FLEXERIL) 10 MG tablet  Take 10 mg by mouth 3 (three) times daily as needed for muscle spasms.  . fluticasone (FLONASE) 50 MCG/ACT nasal spray INHALE 2 SPRAYS IN EACH NOSTRIL ONCE DAILY.  Marland Kitchen gabapentin (NEURONTIN) 300 MG capsule Take 300 mg by mouth 3 (three) times daily. One cap in morning, one cap midday, two caps at night  . ibuprofen (ADVIL,MOTRIN) 200 MG tablet Take 400 mg by mouth every 6 (six) hours as needed (pain).  . Misc Natural Products (COLON CLEANSE PO) Take 1 capsule by mouth daily as needed (constipation).  . Multiple Vitamin (MULITIVITAMIN WITH MINERALS) TABS Take 1 tablet by mouth daily.  . mupirocin ointment (BACTROBAN) 2 % Place 1 application into the nose at bedtime. For dryness from CPAP  . pantoprazole (PROTONIX) 40 MG tablet TAKE (1) TABLET BY MOUTH TWICE A DAY BEFORE MEALS. (BREAKFAST AND SUPPER).  Joel Faster Glycol-Propyl Glycol (SYSTANE OP) Place 1 drop into both eyes daily as needed (seasonal allergies/ dry eyes).  Marland Kitchen PRESCRIPTION MEDICATION Inhale into the lungs at bedtime. CPAP  . Probiotic Product (PROBIOTIC PO) Take 1 tablet by mouth daily.  . tamsulosin (FLOMAX) 0.4 MG CAPS capsule Take 0.4 mg by mouth daily after breakfast.   . venlafaxine XR (EFFEXOR-XR) 37.5 MG 24 hr capsule TAKE 1 CAPSULE ONCE DAILY WITH BREAKFAST.  Marland Kitchen zolpidem (AMBIEN CR) 12.5 MG CR tablet  Take 1 tablet (12.5 mg total) by mouth at bedtime as needed for sleep.  . [DISCONTINUED] metoprolol tartrate (LOPRESSOR) 25 MG tablet Take 0.5 tablets (12.5 mg total) by mouth 2 (two) times daily.   No facility-administered encounter medications on file as of 04/23/2017.   :  Review of Systems:  Out of a complete 14 point review of systems, all are reviewed and negative with the exception of these symptoms as listed below:  Review of Systems  Neurological:       Pt presents today to discuss his cpap. Pt reports that it is going well. Pt is asking for his ambien XR to be changed to ambien 98m IR for insurance coverage reasons.     Objective:  Neurological Exam  Physical Exam Physical Examination:   Vitals:   04/23/17 1140  BP: (!) 146/78  Pulse: 62   General Examination: The patient is a very pleasant 66y.o. male in no acute distress. He appears well-developed and well-nourished and well groomed.   HEENT: Normocephalic, atraumatic, pupils are equal, round and reactive to light and accommodation. Corrective eye glasses in place. Extraocular tracking is good without limitation to gaze excursion or nystagmus noted. Normal smooth pursuit is noted. Hearing is grossly intact. Face is symmetric with normal facial animation and normal facial sensation. Speech is clear with no dysarthria noted. There is no hypophonia. There is no lip, neck/head, jaw or voice tremor. Neck is supple with full range of passive and active motion. There are no carotid bruits on auscultation. Oropharynx exam reveals: Mild mouth dryness, mild posterior pharynx erythema, adequate dental hygiene and moderate airway crowding. Mallampati is class II. Tongue protrudes centrally and palate elevates symmetrically.   Chest: Clear to auscultation without wheezing, rhonchi or crackles noted.  Heart: S1+S2+0, regular and normal without murmurs, rubs or gallops noted.   Abdomen: Soft, non-tender and non-distended with normal bowel sounds appreciated on auscultation.  Extremities: There is no pitting edema in the distal lower extremities bilaterally.   Skin: Warm and dry without trophic changes noted.   Musculoskeletal: exam reveals no obvious joint deformities, tenderness or joint swelling or erythema, With the exception of mild tenderness in the mid back area, As well as mild right knee tenderness and swelling.   Neurologically:  Mental status: The patient is awake, alert and oriented in all 4 spheres. His immediate and remote memory, attention, language skills and fund of knowledge are appropriate. There is no evidence of aphasia, agnosia,  apraxia or anomia. Speech is clear with normal prosody and enunciation. Thought process is linear. Mood is normal and affect is normal.  Cranial nerves II - XII are as described above under HEENT exam. In addition: shoulder shrug is normal with equal shoulder height noted. Motor exam: Normal bulk, strength and tone is noted. There is no drift, tremor or rebound. Romberg is negative. Reflexes are 1+ throughout. Fine motor skills and coordination: intact in the UEs and LEs.  Sensory exam: intact to light touch in the upper and lower extremities.  Gait, station and balance: He stands easily. No veering to one side is noted. No leaning to one side is noted. Posture is age-appropriate and stance is narrow based. Gait shows normal stride length and normal pace. No problems turning are noted. Tandem walk is unremarkable.   Assessment and Plan:   In summary, Joel MARKWOODis a very pleasant 66year old male with an underlying medical history of depression and anxiety, recurrent UTIs, remote Hx of smoking,  mild obesity, and nephrolithiasis, Arthritis, and back pain,who presents for followup consultation of his severe obstructive sleep apnea, treated well with CPAP at a pressure of 8 cwp with Ongoing great compliance.He has ongoing good results from it. He has had issues with sleep Initiation. He has had issues with anxiety. Effexor long-acting has helped. We will increase this today to 75 mg once daily. In the past, he tried Lexapro which caused him side effects including flareup of insomnia. He was on immediate release Ambien in the past and then Ambien CR for the past year or so. He has to go back to immediate release Ambien as of insurance change next year. I provided a prescription for Ambien 10 mg strength as needed. His exam is stable. He is commended for his CPAP compliance. I will see him back in one year for follow-up. I answered all his questions today and he was in agreement. I spent 25 minutes in  total face-to-face time with the patient, more than 50% of which was spent in counseling and coordination of care, reviewing test results, reviewing medication and discussing or reviewing the diagnosis of OSA, its prognosis and treatment options. Pertinent laboratory and imaging test results that were available during this visit with the patient were reviewed by me and considered in my medical decision making (see chart for details).

## 2017-04-27 ENCOUNTER — Telehealth (HOSPITAL_COMMUNITY): Payer: Self-pay | Admitting: Internal Medicine

## 2017-04-27 NOTE — Telephone Encounter (Signed)
04/27/17  Pt called to schedule dry-needling.  He left a message asking [place] to call him to schedule.  I spoke to patient and he said that he was hoping the dr had sent the referral to [place].... The dr is supposed to send [place] the referral to schedule him.

## 2017-05-05 ENCOUNTER — Ambulatory Visit (HOSPITAL_COMMUNITY): Payer: Medicare Other | Attending: Nurse Practitioner

## 2017-05-05 DIAGNOSIS — M546 Pain in thoracic spine: Secondary | ICD-10-CM | POA: Diagnosis not present

## 2017-05-05 DIAGNOSIS — M6281 Muscle weakness (generalized): Secondary | ICD-10-CM | POA: Insufficient documentation

## 2017-05-05 NOTE — Patient Instructions (Signed)

## 2017-05-05 NOTE — Therapy (Addendum)
[place], [place], [address] Phone: [phone]   Fax:  [phone]  Physical Therapy Evaluation  Patient Details  Name: Joel Greene MRN: [id number] Date of Birth: 06-30-1951 Referring Provider: Jaynee Eagles  Glenna Fellows, MD )   Encounter Date: 05/05/2017  PT End of Session - 05/05/17 1241    Visit Number  1    Number of Visits  16    Date for PT Re-Evaluation  06/04/17    Authorization Type  Medicare     Authorization Time Period  05/05/17-07/05/17    Authorization - Visit Number  1    Authorization - Number of Visits  10    PT Start Time  1120    PT Stop Time  1212    PT Time Calculation (min)  52 min    Activity Tolerance  Patient tolerated treatment well;No increased pain    Behavior During Therapy  WFL for tasks assessed/performed       Past Medical History:  Diagnosis Date  . Bronchitis   . DJD (degenerative joint disease)   . Glaucoma   . History of tobacco abuse 10/23/2011   History of smoking for 10 years, a pack per day, quitting at age 79.  . IBS (irritable bowel syndrome)   . Leukopenia 10/23/2011   Decrease in lymphocytes dating back to 1995.  . PUD (peptic ulcer disease) Jan 2017   endoscopy  . Renal calculi   . Sleep apnea    on Greene-pap    Past Surgical History:  Procedure Laterality Date  . APPENDECTOMY    . CYSTOGRAPHY    . REFRACTIVE SURGERY     "to relieve glaucoma"  . ROTATOR CUFF REPAIR     bilateral shoulders  . SP FL GUIDE SPINAL INJ  May 2017    There were no vitals filed for this visit.   Subjective Assessment - 05/05/17 1126    Subjective  Pt has had right thoracic pain with right sided referral to the ribs. Pain is worst lying on right side and doing lots fo yard work (riding lawnmower) bouncing in flexion. Says he is able to rotate standing without pain adn it feels better.     Pertinent History  -bilat knee OA Left > right; history of neck pain with shots  (resolved); history of low back pain with short;    How long can you sit comfortably?  Not limited by sitting.     How long can you stand comfortably?  30 minutes     How long can you walk comfortably?  no limited     Diagnostic tests  throacic discopathy at T6/7; T9/10    Patient Stated Goals  Better tolerance of sleep and housework     Currently in Pain?  Yes    Pain Score  -- 4/10 currently, 5/10 at night   4/10 currently, 5/10 at night   Pain Orientation  Right    Pain Type  Chronic pain    Pain Radiating Towards  right lateral ribs     Aggravating Factors   right sidelying, riding lawnmower.     Pain Relieving Factors  neurontin daily, flexaril (PRN and helps); oxycodone; Left sidelying, Lying on back.          [facility] PT Assessment - 05/05/17 0001      Assessment   Medical Diagnosis  Right thoracic Discopathy     Referring Provider  Gertha Calkin  Carloyn Manner, MD    Glenna Fellows, MD    Onset Date/Surgical Date  -- >1 year ago    >1 year ago    Next MD Visit  Mid January       Balance Screen   Has the patient fallen in the past 6 months  No    Has the patient had a decrease in activity level because of a fear of falling?   No    Is the patient reluctant to leave their home because of a fear of falling?   No      Prior Function   Level of Independence  Independent      Observation/Other Assessments   Focus on Therapeutic Outcomes (FOTO)   53 (47% limited)       Sensation   Light Touch  Appears Intact      ROM / Strength   AROM / PROM / Strength  AROM      AROM   AROM Assessment Site  Thoracic    Thoracic Flexion  very limited (<20 degrees)  pain with overpressure   pain with overpressure   Thoracic Extension  very limited, maintained kyphosis in extension     Thoracic - Right Rotation  58 degrees central T6/7 pain at end range only   central T6/7 pain at end range only   Thoracic - Left Rotation  41 degrees central T6/7 pain at end range only   central T6/7 pain at  end range only     Palpation   Spinal mobility  PA SPing testing painfrul and very hypomobilie T6-T9 T1-T5 mildly hypomobilie, pain free   T1-T5 mildly hypomobilie, pain free   Palpation comment  Right Paraspinal spasm near T6-T9, unable to test facet, costotransverse. joints                 Objective measurements completed on examination: See above findings.      [facility] Adult PT Treatment/Exercise - 05/05/17 0001      Manual Therapy   Manual Therapy  Myofascial release    Manual therapy comments  Right thoracic paraspinals/multifidus 10 minutes   10 minutes      Trigger Point Dry Needling - 05/05/17 1226    Consent Given?  Yes    Education Handout Provided  Yes    Muscles Treated Upper Body  Longissimus    Longissimus Response  Twitch response elicited;Palpable increased muscle length as well as multifidus, iliocostalis; all Rt, T6-8   as well as multifidus, iliocostalis; all Rt, T6-8            PT Short Term Goals - 05/05/17 1250      PT SHORT TERM GOAL #1   Title  After 4 weeks patient will demonstrate improved sleep at night waking 50% less due to thoracic pain.     Status  New      PT SHORT TERM GOAL #2   Title  After 4 weeks patient will demonstrate improved thoracic extension ROM by 10 degrees.     Status  New        PT Long Term Goals - 05/05/17 1251      PT LONG TERM GOAL #1   Title  After 8 weeks patient will demonstrate ability to sleep in all positions at home without increase in pain.     Status  New      PT LONG TERM GOAL #2   Title  After 8 weeks patient will demonstrate >20 degrees thoracic  extension seated.     Status  New      PT LONG TERM GOAL #3   Title  After 8 weeks patient will report no limitations to prolonged standing during household tasks.     Status  New             Plan - 05/05/17 1243    Clinical Impression Statement  Joel Greene is a 66yo white male who presents with 1+ years chornic mid thorcic back pain  with intermittent right flank referral. Light touch sensation intact, no noted burning, numbness, tingling pain. Global thoracic hypomobility noted worse from T6-T9 with increased pain on testing. Pain at end range thoracic rotation, with great range limitatiosn left. Good resposne to manual therpay to address righ thtoracici muscle spasms adn taut bands. Dry needling into these muscles reproduce patient's CC. improve PPT after manual therapies.     History and Personal Factors relevant to plan of care:  Chronic lumbar, thoracic, and cervical painh, chronicknee pain.     Clinical Presentation  Stable    Clinical Presentation due to:  symptoms improved over th epast 3 months with improved medication management    Clinical Decision Making  Moderate    Rehab Potential  Good    PT Frequency  2x / week    PT Duration  8 weeks    PT Treatment/Interventions  ADLs/Self Care Home Management;Electrical Stimulation;Moist Heat;Therapeutic exercise;Therapeutic activities;Functional mobility training;DME Instruction;Patient/family education;Manual techniques;Dry needling;Passive range of motion    PT Next Visit Plan  Review goals and exam findings, revisit thoracic manual therapy, develop HEP for improved thoracic extension ROM and strength.     PT Home Exercise Plan  none issued today     Consulted and Agree with Plan of Care  Patient       Patient will benefit from skilled therapeutic intervention in order to improve the following deficits and impairments:  Hypomobility, Decreased knowledge of precautions, Decreased activity tolerance, Decreased knowledge of use of DME, Increased fascial restricitons, Impaired UE functional use, Pain, Increased muscle spasms, Decreased range of motion, Postural dysfunction  Visit Diagnosis: Pain in thoracic spine - Plan: PT plan of care cert/re-cert  Muscle weakness (generalized) - Plan: PT plan of care cert/re-cert  G-Codes - 85/02/77 1253    Functional Assessment Tool  Used (Outpatient Only)  Foto/ clinical judgment     Functional Limitation  Changing and maintaining body position    Changing and Maintaining Body Position Current Status (A1287)  At least 40 percent but less than 60 percent impaired, limited or restricted    Changing and Maintaining Body Position Goal Status (O6767)  At least 1 percent but less than 20 percent impaired, limited or restricted        Problem List Patient Active Problem List   Diagnosis Date Noted  . Non-cardiac chest pain 11/28/2015  . CVA tenderness 11/27/2015  . Acute coronary syndrome ([place])   . Elevated troponin 11/25/2015  . SOB (shortness of breath) 11/25/2015  . PUD (peptic ulcer disease) 11/25/2015  . Uncontrolled hypertension 11/25/2015  . DJD- spine injection 11/07/15 11/25/2015  . Bilateral flank pain 11/25/2015  . Nephrolithiasis 11/25/2015  . Anxiety 11/25/2015  . GERD (gastroesophageal reflux disease) 07/31/2015  . Abdominal pain 06/07/2015  . Leukopenia 10/23/2011  . History of tobacco abuse 10/23/2011    Joel Greene 05/05/2017, 12:56 PM  12:58 PM, 05/05/17 Etta Grandchild, PT, DPT Physical Therapist at [place] [phone] (office)  [place], [place], [address] Phone: [phone]   Fax:  [phone]  Name: Joel Greene MRN: [id number] Date of Birth: 04-Dec-1950    PHYSICAL THERAPY DISCHARGE SUMMARY   Pt called to report that he must cancel his PT, he is unable to attend until later in the year. He has not returned since his first visit.  Visits from Start of Care: 1  Current functional level related to goals / functional outcomes: Goals not met, pt not returned.    Remaining deficits: *see above   Education / Equipment: N/A  Plan: Patient agrees to discharge.  Patient goals were not met. Patient is being discharged due to not returning since the last visit.  ?????         12:28 PM, 07/10/17 Etta Grandchild, PT, DPT Physical Therapist at [place] [phone] (office)

## 2017-05-07 ENCOUNTER — Encounter (HOSPITAL_COMMUNITY): Payer: Medicare Other

## 2017-05-08 ENCOUNTER — Telehealth (HOSPITAL_COMMUNITY): Payer: Self-pay | Admitting: Internal Medicine

## 2017-05-08 NOTE — Telephone Encounter (Signed)
05/08/17  I called to let patient know that Joel Greene now had a schedule for December if he would like to schedule some appts.

## 2017-05-11 ENCOUNTER — Encounter (HOSPITAL_COMMUNITY): Payer: Medicare Other

## 2017-05-15 ENCOUNTER — Encounter (HOSPITAL_COMMUNITY): Payer: Medicare Other

## 2017-05-18 ENCOUNTER — Encounter (HOSPITAL_COMMUNITY): Payer: Medicare Other

## 2017-05-29 ENCOUNTER — Other Ambulatory Visit (INDEPENDENT_AMBULATORY_CARE_PROVIDER_SITE_OTHER): Payer: Self-pay | Admitting: Internal Medicine

## 2017-05-31 NOTE — Telephone Encounter (Signed)
Patient needs office visit before next refill

## 2017-06-03 ENCOUNTER — Telehealth: Payer: Self-pay

## 2017-06-03 NOTE — Telephone Encounter (Signed)
Patient was calling to let us know that his insurance plan is changing in 2019 and was told that his Ambien XR will need a PA. I advised him that we will not be able to start the PA until January 1 and he can call us with his new ID when he gets it.   [phone] Ref# [id number]

## 2017-06-03 NOTE — Telephone Encounter (Signed)
Noted, I cannot complete a PA for ambien without knowing the member ID. Also, I cannot do a PA proactively until his new plan starts in the new year. Pharmacy will need to send me a PA request when it is needed.

## 2017-06-25 ENCOUNTER — Encounter (INDEPENDENT_AMBULATORY_CARE_PROVIDER_SITE_OTHER): Payer: Self-pay | Admitting: Internal Medicine

## 2017-06-25 NOTE — Telephone Encounter (Signed)
Patient was given an appointment with Deberah Castle, NP for 07/14/17 at 10:00am.  A letter was mailed to the patient.

## 2017-07-01 NOTE — Telephone Encounter (Signed)
Pt called back he said [employer] was to get in touch with the clinic for PA. I advised him to call [facility] and get the member ID, Grp# and RX Bin#. Pt agreed and will call the clinic back with the information.

## 2017-07-06 ENCOUNTER — Telehealth (HOSPITAL_COMMUNITY): Payer: Self-pay | Admitting: Internal Medicine

## 2017-07-06 ENCOUNTER — Telehealth (HOSPITAL_COMMUNITY): Payer: Self-pay

## 2017-07-06 NOTE — Telephone Encounter (Signed)
07/06/17  I called to ask patient if he wanted to schedule more appointments or be discharged.  He was talking on another call and he said he would call our office back.

## 2017-07-06 NOTE — Telephone Encounter (Signed)
Pt requested to be d/c and will come back when he can be more committed to come on a regualar bases. NF 07/06/17

## 2017-07-14 ENCOUNTER — Encounter (INDEPENDENT_AMBULATORY_CARE_PROVIDER_SITE_OTHER): Payer: Self-pay | Admitting: Internal Medicine

## 2017-07-14 ENCOUNTER — Encounter (INDEPENDENT_AMBULATORY_CARE_PROVIDER_SITE_OTHER): Payer: Self-pay

## 2017-07-14 ENCOUNTER — Ambulatory Visit (INDEPENDENT_AMBULATORY_CARE_PROVIDER_SITE_OTHER): Payer: Medicare Other | Admitting: Internal Medicine

## 2017-07-14 VITALS — BP 120/68 | HR 64 | Temp 97.9°F | Ht 70.0 in | Wt 208.6 lb

## 2017-07-14 DIAGNOSIS — K219 Gastro-esophageal reflux disease without esophagitis: Secondary | ICD-10-CM | POA: Diagnosis not present

## 2017-07-14 DIAGNOSIS — K588 Other irritable bowel syndrome: Secondary | ICD-10-CM

## 2017-07-14 MED ORDER — PANTOPRAZOLE SODIUM 40 MG PO TBEC: DELAYED_RELEASE_TABLET | ORAL | 11 refills | Status: DC

## 2017-07-14 MED ORDER — DICYCLOMINE HCL 10 MG PO CAPS: 10.0000 mg | ORAL_CAPSULE | Freq: Three times a day (TID) | ORAL | 11 refills | Status: DC

## 2017-07-14 NOTE — Progress Notes (Signed)
Subjective:    Patient ID: Joel Greene, male    DOB: Nov 11, 1950, 67 y.o.   MRN: [id number]  HPI Here today for f/u. Last seen in January of 2017.  Hx of GERD.  He states he is doing good. His appetite is good. He has gained 28 pounds since his last visit in 2017. Has a BM daily.  No melena or BRRB. Last colonoscopy in 2013. He will be due for a colonoscopy this year.  States he is doing good. No abdominal pain,. Acid reflux is controlled with Protonix. IBS controlled with Dicyclomine. Having one BM a day. No melena or BRRB. He has no GI complaints.    07/12/2015 EGD   Indications: Patient is 67 year old Caucasian male who presents with 5 month history of epigastric and right upper quadrant pain described as throbbing pain after each meal associated with nausea and he has lost 30 pounds disease afraid to eat. He also has intermittent diarrhea and constipation and the symptoms have improved with dicyclomine. He does not take OTC NSAIDs. CBC and comprehensive chemistry panel have been normal. Ultrasound is negative for cholelithiasis and HIDA scan revealed EF of 92%. Abdominal pelvic CTs been unremarkable. He is undergoing diagnostic EGD. Impression: Single erosion noted at GE junction along with erythema to mucosa and wavy GE junction. Biopsies taken from GE junction. Erosive gastroduodenitis. Antral biopsy taken for routine histology.     06/12/2015 HIDA scan: Rt upper quadrant pain:  IMPRESSION: Study within normal limits.  06/06/2015 US abdomen: abdominal pain, nausea. IMPRESSION: 1. No gallstones or findings for acute cholecystitis. Mild gallbladder contraction. 2. Normal caliber common bile duct. 3. Mild diffuse fatty infiltration of the liver.  09/01/2012: Colonoscopy  Indications: Patient is 67 year old Caucasian male with history of colonic adenomas who is  undergoing surveillance colonoscopy. Impression:  Examination performed to cecum. Two small polyps removed from transverse colon and submitted together(one cold snared and the other one removed via cold biopsy). two small diverticula at sigmoid colon.  Small polyps are tubular adenomas Next colonoscopy in 5 years      Review of Systems Past Medical History:  Diagnosis Date  . Bronchitis   . DJD (degenerative joint disease)   . Glaucoma   . History of tobacco abuse 10/23/2011   History of smoking for 10 years, a pack per day, quitting at age 48.  . IBS (irritable bowel syndrome)   . Leukopenia 10/23/2011   Decrease in lymphocytes dating back to 1995.  . PUD (peptic ulcer disease) Jan 2017   endoscopy  . Renal calculi   . Sleep apnea    on C-pap    Past Surgical History:  Procedure Laterality Date  . APPENDECTOMY    . CARDIAC CATHETERIZATION N/A 11/27/2015   Procedure: Left Heart Cath and Coronary Angiography;  Surgeon: Belva Crome, MD;  Location: [facility];  Service: Cardiovascular;  Laterality: N/A;  . COLONOSCOPY N/A 09/01/2012   Procedure: COLONOSCOPY;  Surgeon: [name], MD;  [facility];  Service: Endoscopy;  Laterality: N/A;  1030  . CYSTOGRAPHY    . ESOPHAGOGASTRODUODENOSCOPY N/A 07/12/2015   Procedure: ESOPHAGOGASTRODUODENOSCOPY (EGD);  Surgeon: [name], MD;  Location: [facility];  Service: Endoscopy;  Laterality: N/A;  2:50 - moved to 1:00 - Ann to notify  . REFRACTIVE SURGERY     "to relieve glaucoma"  . ROTATOR CUFF REPAIR     bilateral shoulders  . SP FL GUIDE SPINAL INJ  May  2017    Allergies  Allergen Reactions  . Clams [Shellfish Allergy] Nausea And Vomiting  . Demeclocycline Rash  . Tetracyclines & Related Rash  . Tylenol [Acetaminophen] Rash    Current Outpatient Medications on File Prior to Visit  Medication Sig Dispense Refill  . ALPRAZolam (XANAX) 1 MG tablet Take 1 mg by mouth 2 (two) times daily as  needed for anxiety or sleep.     Marland Kitchen aspirin EC 81 MG tablet Take 81 mg by mouth daily.    . Cholecalciferol (VITAMIN D3) 5000 units TABS Take 5,000 Units by mouth daily.    . cyclobenzaprine (FLEXERIL) 10 MG tablet Take 10 mg by mouth 3 (three) times daily as needed for muscle spasms.    . fluticasone (FLONASE) 50 MCG/ACT nasal spray Place 2 sprays into both nostrils daily. 16 g 3  . gabapentin (NEURONTIN) 300 MG capsule Take 300 mg by mouth 3 (three) times daily. One cap in morning, one cap midday, two caps at night    . ibuprofen (ADVIL,MOTRIN) 200 MG tablet Take 400 mg by mouth every 6 (six) hours as needed (pain).    . Misc Natural Products (COLON CLEANSE PO) Take 1 capsule by mouth daily as needed (constipation).    . Multiple Vitamin (MULITIVITAMIN WITH MINERALS) TABS Take 1 tablet by mouth daily.    . mupirocin ointment (BACTROBAN) 2 % Place 1 application into the nose at bedtime. For dryness from CPAP    . Polyethyl Glycol-Propyl Glycol (SYSTANE OP) Place 1 drop into both eyes daily as needed (seasonal allergies/ dry eyes).    Marland Kitchen PRESCRIPTION MEDICATION Inhale into the lungs at bedtime. CPAP    . Probiotic Product (PROBIOTIC PO) Take 1 tablet by mouth daily.    . tamsulosin (FLOMAX) 0.4 MG CAPS capsule Take 0.4 mg by mouth daily after breakfast.     . venlafaxine XR (EFFEXOR XR) 75 MG 24 hr capsule Take 1 capsule (75 mg total) by mouth daily with breakfast. 30 capsule 5  . zolpidem (AMBIEN) 10 MG tablet Take 1 tablet (10 mg total) by mouth at bedtime as needed for sleep. 30 tablet 5   No current facility-administered medications on file prior to visit.         Objective:   Physical Exam Blood pressure 120/68, pulse 64, temperature 97.9 F (36.6 C), weight 208 lb 9.6 oz (94.6 kg). Alert and oriented. Skin warm and dry. Oral mucosa is moist.   . Sclera anicteric, conjunctivae is pink. Thyroid not enlarged. No cervical lymphadenopathy. Lungs clear. Heart regular rate and rhythm.  Abdomen  is soft. Bowel sounds are positive. No hepatomegaly. No abdominal masses felt. No tenderness.  No edema to lower extremities.           Assessment & Plan:  GERD. Continue the Protonix BID. IBS: Continue the Dicyclomine  OV in 1 year.  Refill x 1 yr on his Protonix and DIcyclomine

## 2017-07-14 NOTE — Patient Instructions (Signed)
Recall for colonoscopy in March of 2019

## 2017-07-21 DIAGNOSIS — Z6831 Body mass index (BMI) 31.0-31.9, adult: Secondary | ICD-10-CM | POA: Diagnosis not present

## 2017-07-21 DIAGNOSIS — G894 Chronic pain syndrome: Secondary | ICD-10-CM | POA: Diagnosis not present

## 2017-07-21 DIAGNOSIS — M1991 Primary osteoarthritis, unspecified site: Secondary | ICD-10-CM | POA: Diagnosis not present

## 2017-07-21 DIAGNOSIS — G4700 Insomnia, unspecified: Secondary | ICD-10-CM | POA: Diagnosis not present

## 2017-07-21 DIAGNOSIS — Z1389 Encounter for screening for other disorder: Secondary | ICD-10-CM | POA: Diagnosis not present

## 2017-07-21 DIAGNOSIS — Z23 Encounter for immunization: Secondary | ICD-10-CM | POA: Diagnosis not present

## 2017-07-22 DIAGNOSIS — R291 Meningismus: Secondary | ICD-10-CM | POA: Diagnosis not present

## 2017-07-22 DIAGNOSIS — R350 Frequency of micturition: Secondary | ICD-10-CM | POA: Diagnosis not present

## 2017-07-22 DIAGNOSIS — N401 Enlarged prostate with lower urinary tract symptoms: Secondary | ICD-10-CM | POA: Diagnosis not present

## 2017-07-22 DIAGNOSIS — M5489 Other dorsalgia: Secondary | ICD-10-CM | POA: Diagnosis not present

## 2017-07-22 DIAGNOSIS — R351 Nocturia: Secondary | ICD-10-CM | POA: Diagnosis not present

## 2017-07-28 DIAGNOSIS — M5414 Radiculopathy, thoracic region: Secondary | ICD-10-CM | POA: Diagnosis not present

## 2017-08-04 DIAGNOSIS — N401 Enlarged prostate with lower urinary tract symptoms: Secondary | ICD-10-CM | POA: Diagnosis not present

## 2017-08-13 DIAGNOSIS — N5201 Erectile dysfunction due to arterial insufficiency: Secondary | ICD-10-CM | POA: Diagnosis not present

## 2017-08-13 DIAGNOSIS — N401 Enlarged prostate with lower urinary tract symptoms: Secondary | ICD-10-CM | POA: Diagnosis not present

## 2017-08-13 DIAGNOSIS — E291 Testicular hypofunction: Secondary | ICD-10-CM | POA: Diagnosis not present

## 2017-09-03 ENCOUNTER — Ambulatory Visit: Admission: RE | Admit: 2017-09-03 | Payer: Medicare Other | Source: Ambulatory Visit | Admitting: Urology

## 2017-09-03 ENCOUNTER — Encounter: Admission: RE | Payer: Self-pay | Source: Ambulatory Visit

## 2017-09-03 SURGERY — LITHOTRIPSY, ESWL
Anesthesia: Moderate Sedation | Laterality: Right

## 2017-09-08 ENCOUNTER — Other Ambulatory Visit: Payer: Self-pay | Admitting: Neurology

## 2017-09-16 ENCOUNTER — Encounter (INDEPENDENT_AMBULATORY_CARE_PROVIDER_SITE_OTHER): Payer: Self-pay | Admitting: *Deleted

## 2017-09-18 DIAGNOSIS — Z79899 Other long term (current) drug therapy: Secondary | ICD-10-CM | POA: Diagnosis not present

## 2017-09-18 DIAGNOSIS — S0091XA Abrasion of unspecified part of head, initial encounter: Secondary | ICD-10-CM | POA: Diagnosis not present

## 2017-09-18 DIAGNOSIS — R4182 Altered mental status, unspecified: Secondary | ICD-10-CM | POA: Diagnosis not present

## 2017-09-18 DIAGNOSIS — W19XXXA Unspecified fall, initial encounter: Secondary | ICD-10-CM | POA: Diagnosis not present

## 2017-09-18 DIAGNOSIS — S0093XA Contusion of unspecified part of head, initial encounter: Secondary | ICD-10-CM | POA: Diagnosis not present

## 2017-09-18 DIAGNOSIS — R402411 Glasgow coma scale score 13-15, in the field [EMT or ambulance]: Secondary | ICD-10-CM | POA: Diagnosis not present

## 2017-09-18 DIAGNOSIS — R410 Disorientation, unspecified: Secondary | ICD-10-CM | POA: Diagnosis not present

## 2017-09-18 DIAGNOSIS — M542 Cervicalgia: Secondary | ICD-10-CM | POA: Diagnosis not present

## 2017-09-28 ENCOUNTER — Other Ambulatory Visit: Payer: Self-pay | Admitting: Neurology

## 2017-10-17 ENCOUNTER — Other Ambulatory Visit: Payer: Self-pay | Admitting: Neurology

## 2017-10-20 ENCOUNTER — Other Ambulatory Visit: Payer: Self-pay | Admitting: Neurology

## 2017-10-27 DIAGNOSIS — M545 Low back pain: Secondary | ICD-10-CM | POA: Diagnosis not present

## 2017-10-27 DIAGNOSIS — M47812 Spondylosis without myelopathy or radiculopathy, cervical region: Secondary | ICD-10-CM | POA: Diagnosis not present

## 2017-10-27 DIAGNOSIS — M5414 Radiculopathy, thoracic region: Secondary | ICD-10-CM | POA: Diagnosis not present

## 2017-11-17 DIAGNOSIS — H401132 Primary open-angle glaucoma, bilateral, moderate stage: Secondary | ICD-10-CM | POA: Diagnosis not present

## 2017-11-18 ENCOUNTER — Other Ambulatory Visit: Payer: Self-pay | Admitting: Neurology

## 2017-11-19 ENCOUNTER — Other Ambulatory Visit: Payer: Self-pay

## 2017-11-19 MED ORDER — ZOLPIDEM TARTRATE 10 MG PO TABS: 10.0000 mg | ORAL_TABLET | Freq: Every evening | ORAL | 0 refills | Status: DC | PRN

## 2017-11-24 DIAGNOSIS — E039 Hypothyroidism, unspecified: Secondary | ICD-10-CM | POA: Diagnosis not present

## 2017-11-24 DIAGNOSIS — Z23 Encounter for immunization: Secondary | ICD-10-CM | POA: Diagnosis not present

## 2017-11-24 DIAGNOSIS — G894 Chronic pain syndrome: Secondary | ICD-10-CM | POA: Diagnosis not present

## 2017-11-24 DIAGNOSIS — I249 Acute ischemic heart disease, unspecified: Secondary | ICD-10-CM | POA: Diagnosis not present

## 2017-11-24 DIAGNOSIS — Z1389 Encounter for screening for other disorder: Secondary | ICD-10-CM | POA: Diagnosis not present

## 2017-11-24 DIAGNOSIS — K529 Noninfective gastroenteritis and colitis, unspecified: Secondary | ICD-10-CM | POA: Diagnosis not present

## 2017-11-24 DIAGNOSIS — D72819 Decreased white blood cell count, unspecified: Secondary | ICD-10-CM | POA: Diagnosis not present

## 2017-11-24 DIAGNOSIS — Z125 Encounter for screening for malignant neoplasm of prostate: Secondary | ICD-10-CM | POA: Diagnosis not present

## 2017-11-24 DIAGNOSIS — N400 Enlarged prostate without lower urinary tract symptoms: Secondary | ICD-10-CM | POA: Diagnosis not present

## 2017-11-24 DIAGNOSIS — E663 Overweight: Secondary | ICD-10-CM | POA: Diagnosis not present

## 2017-11-24 DIAGNOSIS — Z6829 Body mass index (BMI) 29.0-29.9, adult: Secondary | ICD-10-CM | POA: Diagnosis not present

## 2017-11-25 ENCOUNTER — Other Ambulatory Visit (HOSPITAL_COMMUNITY): Payer: Self-pay | Admitting: Internal Medicine

## 2017-11-25 DIAGNOSIS — H52223 Regular astigmatism, bilateral: Secondary | ICD-10-CM | POA: Diagnosis not present

## 2017-11-25 DIAGNOSIS — H524 Presbyopia: Secondary | ICD-10-CM | POA: Diagnosis not present

## 2017-11-25 DIAGNOSIS — H401132 Primary open-angle glaucoma, bilateral, moderate stage: Secondary | ICD-10-CM | POA: Diagnosis not present

## 2017-11-25 DIAGNOSIS — R945 Abnormal results of liver function studies: Secondary | ICD-10-CM

## 2017-11-25 DIAGNOSIS — H5213 Myopia, bilateral: Secondary | ICD-10-CM | POA: Diagnosis not present

## 2017-11-26 DIAGNOSIS — Z1389 Encounter for screening for other disorder: Secondary | ICD-10-CM | POA: Diagnosis not present

## 2017-11-26 DIAGNOSIS — K529 Noninfective gastroenteritis and colitis, unspecified: Secondary | ICD-10-CM | POA: Diagnosis not present

## 2017-11-30 DIAGNOSIS — E748 Other specified disorders of carbohydrate metabolism: Secondary | ICD-10-CM | POA: Diagnosis not present

## 2017-11-30 DIAGNOSIS — K529 Noninfective gastroenteritis and colitis, unspecified: Secondary | ICD-10-CM | POA: Diagnosis not present

## 2017-11-30 DIAGNOSIS — Z1389 Encounter for screening for other disorder: Secondary | ICD-10-CM | POA: Diagnosis not present

## 2017-11-30 DIAGNOSIS — R7309 Other abnormal glucose: Secondary | ICD-10-CM | POA: Diagnosis not present

## 2017-11-30 DIAGNOSIS — R945 Abnormal results of liver function studies: Secondary | ICD-10-CM | POA: Diagnosis not present

## 2017-12-01 ENCOUNTER — Ambulatory Visit (HOSPITAL_COMMUNITY)
Admission: RE | Admit: 2017-12-01 | Discharge: 2017-12-01 | Disposition: A | Discharge status: Home / Self Care | Payer: Medicare Other | Source: Ambulatory Visit | Attending: Internal Medicine | Admitting: Internal Medicine

## 2017-12-01 DIAGNOSIS — R945 Abnormal results of liver function studies: Secondary | ICD-10-CM | POA: Insufficient documentation

## 2017-12-01 DIAGNOSIS — K760 Fatty (change of) liver, not elsewhere classified: Secondary | ICD-10-CM | POA: Insufficient documentation

## 2017-12-01 DIAGNOSIS — N200 Calculus of kidney: Secondary | ICD-10-CM | POA: Insufficient documentation

## 2017-12-25 ENCOUNTER — Other Ambulatory Visit: Payer: Self-pay | Admitting: Neurology

## 2018-03-02 DIAGNOSIS — M5414 Radiculopathy, thoracic region: Secondary | ICD-10-CM | POA: Diagnosis not present

## 2018-04-01 DIAGNOSIS — F419 Anxiety disorder, unspecified: Secondary | ICD-10-CM | POA: Diagnosis not present

## 2018-04-01 DIAGNOSIS — G894 Chronic pain syndrome: Secondary | ICD-10-CM | POA: Diagnosis not present

## 2018-04-01 DIAGNOSIS — G4730 Sleep apnea, unspecified: Secondary | ICD-10-CM | POA: Diagnosis not present

## 2018-04-01 DIAGNOSIS — M1991 Primary osteoarthritis, unspecified site: Secondary | ICD-10-CM | POA: Diagnosis not present

## 2018-04-01 DIAGNOSIS — J309 Allergic rhinitis, unspecified: Secondary | ICD-10-CM | POA: Diagnosis not present

## 2018-04-01 DIAGNOSIS — Z23 Encounter for immunization: Secondary | ICD-10-CM | POA: Diagnosis not present

## 2018-04-01 DIAGNOSIS — Z0001 Encounter for general adult medical examination with abnormal findings: Secondary | ICD-10-CM | POA: Diagnosis not present

## 2018-04-01 DIAGNOSIS — D72819 Decreased white blood cell count, unspecified: Secondary | ICD-10-CM | POA: Diagnosis not present

## 2018-04-01 DIAGNOSIS — Z1389 Encounter for screening for other disorder: Secondary | ICD-10-CM | POA: Diagnosis not present

## 2018-04-01 DIAGNOSIS — K219 Gastro-esophageal reflux disease without esophagitis: Secondary | ICD-10-CM | POA: Diagnosis not present

## 2018-04-01 DIAGNOSIS — I10 Essential (primary) hypertension: Secondary | ICD-10-CM | POA: Diagnosis not present

## 2018-04-01 DIAGNOSIS — K589 Irritable bowel syndrome without diarrhea: Secondary | ICD-10-CM | POA: Diagnosis not present

## 2018-04-20 DIAGNOSIS — M1711 Unilateral primary osteoarthritis, right knee: Secondary | ICD-10-CM | POA: Diagnosis not present

## 2018-04-27 ENCOUNTER — Encounter: Payer: Self-pay | Admitting: Neurology

## 2018-04-27 ENCOUNTER — Ambulatory Visit (INDEPENDENT_AMBULATORY_CARE_PROVIDER_SITE_OTHER): Payer: Medicare Other | Admitting: Neurology

## 2018-04-27 VITALS — BP 150/80 | HR 84 | Ht 70.0 in | Wt 192.0 lb

## 2018-04-27 DIAGNOSIS — Z9989 Dependence on other enabling machines and devices: Secondary | ICD-10-CM

## 2018-04-27 DIAGNOSIS — G4733 Obstructive sleep apnea (adult) (pediatric): Secondary | ICD-10-CM

## 2018-04-27 MED ORDER — FLUTICASONE PROPIONATE 50 MCG/ACT NA SUSP: 2.0000 | Freq: Every day | NASAL | 0 refills | Status: DC

## 2018-04-27 MED ORDER — VENLAFAXINE HCL ER 75 MG PO CP24: ORAL_CAPSULE | ORAL | 5 refills | Status: DC

## 2018-04-27 NOTE — Patient Instructions (Addendum)
Please use your CPAP consistently.  If your machine has been defective or is acting up, please get in touch with [facility] immediately.  You may be eligible for a new machine in about 6 months, you can call us at the time.  Please call us also in about one month, so I can review another CPAP usage download.  I will see you back routinely in one year.

## 2018-04-27 NOTE — Progress Notes (Signed)
Subjective:    Patient ID: Joel Greene is a 68 y.o. male.  HPI     Interim history:   Joel Greene is a 67 year old right-handed gentleman with an underlying medical history of anxiety, recurrent UTIs, remote Hx of smoking, mild obesity, and nephrolithiasis, who presents for followup consultation of his obstructive sleep apnea, treated with CPAP. He is unaccompanied today. I last saw him on 04/23/2017, at which time he was compliant with CPAP. He was supposed to switch from Ambien CR to regular generic Ambien due to an insurance change. He was endorsing some stress and anxiety. He had been on Effexor long-acting. He had a prescription for gabapentin and Flexeril through his spine surgeon. I increased his Effexor long-acting to 75 mg once daily.  Today, 04/27/2018: I reviewed his CPAP compliance data from 03/27/2018 through 04/25/2018 which is a total of 30 days, during which time he used his CPAP only 14 days with percent used days greater than 4 hours at 40%, indicating suboptimal compliance with an average usage of 6 hours and 54 minutes, residual AHI 3.4 Joel hour, leak acceptable with the 95th percentile at 12.7 L/m on a pressure of 8 cm. In the past 90 days his CPAP usage was 26 out of 90 days with percent used days greater than 4 hours at only 24%. He reports that he was unable to use his machine as it was not working. He had a difficult time with his DME office and went to another location in [facility], eventually his CPAP started working in the recent couple of weeks. He has been on Ambien generic 10 mg strength Joel primary care physician. He has noticed an improvement in his mood after we increased his Effexor last time. He continues to take it once daily, 75 mg strength. He would like to get a refill on the Flonase.   The patient's allergies, current medications, family history, past medical history, past social history, past surgical history and problem list were reviewed and updated as  appropriate.    Previously (copied from previous notes for reference):    I saw him on 04/23/2016, at which time he reported doing well. He had started Effexor which had helped. Stress level was better. He was still taking Ambien CR as needed, up to 4 times a week. He had recently forgotten to take his CPAP machine on a trip but otherwise was compliant with treatment. He had a recent workup for chest pain, negative for heart attack. I suggested a one-year follow-up appointment for sleep apnea.   I reviewed his CPAP compliance data from 03/23/2017 through 04/21/2017 which is a total of 30 days, during which time he used his CPAP 29 days with percent used days greater than 4 hours at 87%, indicating very good compliance with an average usage of 6 hours and 50 minutes, residual AHI 2.9 Joel hour, leak low with the 95th percentile at 4.6 L/m on a pressure of 8 cm.    I saw him on 10/17/2015, at which time he reported ongoing issues with sleep maintenance. He was compliant with CPAP therapy. [name] did not help and was also no longer covered by his insurance. He was on generic Ambien Joel primary care physician and had tried Ambien CR in the past. I did not recommend trazodone or temazepam or Ambien. He requested to try Ambien CR again and I prescribed this in generic form. He was having some issues with depression. He had had side effects with Lexapro  in the past including insomnia. I suggested low-dose long-acting Effexor at 37.5 mg daily.    I reviewed his CPAP compliance data from 03/23/2016 through 04/21/2016 which is a total of 30 days, during which time he used his CPAP only 19 days with percent used days greater than 4 hours at 60% only, indicating suboptimal compliance with an average usage of 5 hours and 15 minutes, residual AHI 2.7 Joel hour, leaked low with the 95th percentile at 4.7 L/m on a pressure of 8 cm with EPR of 1.    I saw him on 10/10/2014, at which time he reported that he recently had  rotator cuff surgery on the right, December 2015. He also saw Dr. Glenna Fellows for cervical spine disease. He was supposed to see Dr. Francesco Runner for neck injections. He was also referred to a hand surgeon for right hand Dupuytren's contracture. As far as his sleep is concerned he was compliant with treatment with CPAP and continued to endorse good results in fact felt he could not sleep without it. For sleep maintenance and sleep initiation issues we had tried sonata and he requested a refill. He was using it about 4 times a week. He had to change his DME company after his insurance changed after he turned 28. He had some neck pain with radiation to the right.    I reviewed his CPAP compliance data from 09/16/2015 through 10/15/2015 which is a total of 30 days during which time he used his machine 29 days with percent used days greater than 4 hours at 93%, indicating excellent compliance with an average usage of 8 hours and 45 minutes, residual AHI 2.8 Joel hour, leak low for the 95th percentile at 1.8 L/m on a pressure of 8 cm.    I saw him on 04/11/2014, at which time he reported that sleep maintenance was still an issue from time to time. He was using Ambien as needed. He felt it was not as helpful any longer. He was compliant with treatment. He had no residual symptoms after his recent fall from a ladder.    I reviewed his CPAP compliance data from 09/09/2014 through 10/08/2014 which is a total of 30 days during which time he used his machine every night with percent used days greater than 4 hours of 100%, indicating superb compliance with an average usage of 11 hours and 9 minutes and residual AHI low at 1.7 Joel hour, leak low with the 95th percentile at 1.4 L/m on a pressure of 8 cm without EPR.   I saw him on 10/12/2013, at which time we talked about his sleep study results from March. We also talked about his compliance data. He reported sleeping much better, feeling much less restless, and less sleepy  during the day. Nocturia had improved. He did report some soreness in his left nostril due to the mask. He had been taking care of his mother's estate who passed away in 2013-08-15. He did not have to use his oxygen at night once he was placed on CPAP therapy. In the interim, he presented to the emergency room on 11/18/2013 after falling off of ladder. He fell while cutting limbs from a tree and fell about 8 feet. He hurt his back and also sustained a laceration of the scrotum.   I reviewed the emergency room records and his imaging test results from 11/18/2013: Dg Thoracic Spine W/swimmers: Negative.   Dg Lumbar Spine Complete: No acute osseous abnormality of the lumbar spine.  Dg Pelvis 1-2 Views: Negative.   Ct Head and cervical spine Wo Contrast: 1. No acute intracranial or cervical spine findings. 2. Cervical spondylosis. 3. Small hypodensity in the right external capsule may represent a small remote lacunar infarct. 4. Minimal chronic left maxillary and ethmoid sinusitis.     I reviewed the patient's CPAP compliance data from 12/07/2013 to 01/05/2014, which is a total of 30 days, during which time the patient used CPAP every day. The average usage for all days was 8 hours and 50 minutes. The percent used days greater than 4 hours was 100 %, indicating superb compliance. The residual AHI was 2.3 Joel hour, indicating an adequate treatment pressure of 8 cwp with EPR of 1. Air leak from the mask was low at 13.4 L Joel minute at the 95th percentile.  I reviewed his compliance from 03/10/2014 to 04/08/2014 which is a total of 30 days during which time he used his machine every night with 100% compliance. Average usage of 10 hours and 34 minutes, pressure at 8 cm with EPR of 1. Residual AHI low at 1.6 Joel hour, leak low 8.6 L Joel minute for the 95th percentile.     I first met him on 08/22/2013, at which time he reported a recent abnormal overnight pulse oximetry test, which showed periods of recurrent  desaturations as low as 62%. He reported snoring and apneic pauses while asleep and waking up with a choking or gasping sensation. He was started on oxygen and reported sleeping much better since then. He has a Hx of prolonged bronchitis some 4 years ago (after he took the flu shot, he says), and was treated with ABx and steroids. He did not take the flu shot after that. He has been on Ambien or Xanax at night. His urologist has recommended a sleep study. He has nasal congestion, and uses OTC afrin. I requested that he return for a sleep study. He had a split-night sleep study in on 08/29/13: His baseline sleep efficiency was reduced at 80.6% with a latency to sleep of 16 minutes and wake after sleep onset of 14 minutes with moderate sleep fragmentation noted. He had an increased percentage of light stage sleep and absence of slow-wave and REM sleep. He had moderate to loud snoring. His total AHI was 60.7 Joel hour. Baseline oxygen saturation was only 89% with a nadir of 76%. Time below 88% saturation was 50 minutes and 24 seconds. He was then started on CPAP. Sleep efficiency was markedly increased at 96.7%. He had an improved arousal index. He had a normal percentage of light stage sleep, absence of slow-wave sleep and an increased percentage of REM sleep at 36.5% with a very low REM latency. He was titrated using a medium nasal pillows mask starting at 5 cm with a final pressure of 8 cm and reduction of his AHI to 1.3 events Joel hour at a pressure with supine REM sleep achieved. The average oxygen saturation with CPAP was 92% with a nadir of 82%. Time below 88% saturation with CPAP was 2 minutes and 19 seconds. I placed him on CPAP therapy after that.   I reviewed his compliance data from 09/06/2013 through 10/11/2013 which is the last 36 days during which time he uses CPAP every night with a percent used days greater than 4 hours of 94%, indicating excellent compliance, average usage was 8 hours and 7 minutes,  residual AHI at 2.6 with a very low leak. Pressure is 8 with EPR of  1.   His Past Medical History Is Significant For: Past Medical History:  Diagnosis Date  . Bronchitis   . DJD (degenerative joint disease)   . Glaucoma   . History of tobacco abuse 10/23/2011   History of smoking for 10 years, a pack Joel day, quitting at age 12.  . IBS (irritable bowel syndrome)   . Leukopenia 10/23/2011   Decrease in lymphocytes dating back to 1995.  . PUD (peptic ulcer disease) Jan 2017   endoscopy  . Renal calculi   . Sleep apnea    on C-pap    His Past Surgical History Is Significant For: Past Surgical History:  Procedure Laterality Date  . APPENDECTOMY    . CARDIAC CATHETERIZATION N/A 11/27/2015   Procedure: Left Heart Cath and Coronary Angiography;  Surgeon: Belva Crome, MD;  Location: [facility];  Service: Cardiovascular;  Laterality: N/A;  . COLONOSCOPY N/A 09/01/2012   Procedure: COLONOSCOPY;  Surgeon: [name], MD;  Location: [facility];  Service: Endoscopy;  Laterality: N/A;  1030  . CYSTOGRAPHY    . ESOPHAGOGASTRODUODENOSCOPY N/A 07/12/2015   Procedure: ESOPHAGOGASTRODUODENOSCOPY (EGD);  Surgeon: [name], MD;  Location: [facility];  Service: Endoscopy;  Laterality: N/A;  2:50 - moved to 1:00 - Ann to notify  . REFRACTIVE SURGERY     "to relieve glaucoma"  . ROTATOR CUFF REPAIR     bilateral shoulders  . SP FL GUIDE SPINAL INJ  May 2017    His Family History Is Significant For: Family History  Problem Relation Age of Onset  . Breast cancer Mother   . Kidney Stones Brother   . Hypertension Mother     His Social History Is Significant For: Social History   Socioeconomic History  . Marital status: Divorced    Spouse name: Not on file  . Number of children: 1  . Years of education: college  . Highest education level: Not on file  Occupational History  . Occupation: retired  Scientific laboratory technician  . Financial resource strain: Not on file  . Food  insecurity:    Worry: Not on file    Inability: Not on file  . Transportation needs:    Medical: Not on file    Non-medical: Not on file  Tobacco Use  . Smoking status: Former Smoker    Packs/day: 2.00    Years: 15.00    Pack years: 30.00    Types: Cigarettes    Last attempt to quit: 07/01/1983    Years since quitting: 34.8  . Smokeless tobacco: Never Used  Substance and Sexual Activity  . Alcohol use: Yes    Alcohol/week: 0.0 standard drinks    Comment: occasional  . Drug use: No  . Sexual activity: Not on file  Lifestyle  . Physical activity:    Days Joel week: Not on file    Minutes Joel session: Not on file  . Stress: Not on file  Relationships  . Social connections:    Talks on phone: Not on file    Gets together: Not on file    Attends religious service: Not on file    Active member of club or organization: Not on file    Attends meetings of clubs or organizations: Not on file    Relationship status: Not on file  Other Topics Concern  . Not on file  Social History Narrative  . Not on file    His Allergies Are:  Allergies  Allergen  Reactions  . Clams [Shellfish Allergy] Nausea And Vomiting  . Demeclocycline Rash  . Tetracyclines & Related Rash  . Tylenol [Acetaminophen] Rash  :  His Current Medications Are:  Outpatient Encounter Medications as of 04/27/2018  Medication Sig  . aspirin EC 81 MG tablet Take 81 mg by mouth daily.  . cyclobenzaprine (FLEXERIL) 10 MG tablet Take 10 mg by mouth 3 (three) times daily as needed for muscle spasms.  . fluticasone (FLONASE) 50 MCG/ACT nasal spray INHALE 2 SPRAYS IN EACH NOSTRIL ONCE DAILY.  Marland Kitchen gabapentin (NEURONTIN) 300 MG capsule Take 300 mg by mouth 3 (three) times daily. One cap in morning, one cap midday, two caps at night  . ibuprofen (ADVIL,MOTRIN) 200 MG tablet Take 400 mg by mouth every 6 (six) hours as needed (pain).  . Misc Natural Products (COLON CLEANSE PO) Take 1 capsule by mouth daily as needed  (constipation).  . Multiple Vitamin (MULITIVITAMIN WITH MINERALS) TABS Take 1 tablet by mouth daily.  . mupirocin ointment (BACTROBAN) 2 % Place 1 application into the nose at bedtime. For dryness from CPAP  . pantoprazole (PROTONIX) 40 MG tablet TAKE (1) TABLET BY MOUTH TWICE DAILY BEFORE MEALS (BREAKFAST AND SUPPER).  Vladimir Faster Glycol-Propyl Glycol (SYSTANE OP) Place 1 drop into both eyes daily as needed (seasonal allergies/ dry eyes).  Marland Kitchen PRESCRIPTION MEDICATION Inhale into the lungs at bedtime. CPAP  . Probiotic Product (PROBIOTIC PO) Take 1 tablet by mouth daily.  . tamsulosin (FLOMAX) 0.4 MG CAPS capsule Take 0.4 mg by mouth daily after breakfast.   . venlafaxine XR (EFFEXOR-XR) 75 MG 24 hr capsule TAKE 1 CAPSULE ONCE A DAY WITH BREAKFAST.  Marland Kitchen zolpidem (AMBIEN) 10 MG tablet Take 1 tablet (10 mg total) by mouth at bedtime as needed. As needed use only, not for nightly use. (Patient not taking: Reported on 04/27/2018)  . [DISCONTINUED] ALPRAZolam (XANAX) 1 MG tablet Take 1 mg by mouth 2 (two) times daily as needed for anxiety or sleep.   . [DISCONTINUED] Cholecalciferol (VITAMIN D3) 5000 units TABS Take 5,000 Units by mouth daily.  . [DISCONTINUED] dicyclomine (BENTYL) 10 MG capsule Take 1 capsule (10 mg total) by mouth 3 (three) times daily before meals.   No facility-administered encounter medications on file as of 04/27/2018.   : Review of Systems:  Out of a complete 14 point review of systems, all are reviewed and negative with the exception of these symptoms as listed below:  Review of Systems  Neurological:       Pt presents today to discuss his cpap. Pt says that his cpap broke and he had a hard time finding the replacement wire. The [facility] in [place] will not sell him supplies any longer; he has to use the [employer] store. Pt reports that [employer] won't pay for his Lorrin Mais any longer and is asking if we can do a PA or appeal this decision. Pt is asking for a refill on flonase.    Objective:   Neurological Exam  Physical Exam Physical Examination:   Vitals:   04/27/18 1135  BP: (!) 150/80  Pulse: 84   General Examination: The patient is a very pleasant 67 y.o. male in no acute distress. He appears well-developed and well-nourished and well groomed.   HEENT:Normocephalic, atraumatic, pupils are equal, round and reactive to light and accommodation. Corrective eye glasses in place. Extraocular tracking is good without limitation to gaze excursion or nystagmus noted. Normal smooth pursuit is noted. Hearing is grossly intact. Face is symmetric  with normal facial animation and normal facial sensation. Speech is clear with no dysarthria noted. There is no hypophonia. There is no lip, neck/head, jaw or voice tremor. Neck shows FROM. Oropharynx exam reveals: Moderate mouth dryness, mild posterior pharynx erythema, adequate dental hygiene and moderate airway crowding. Mallampati is class II. Tongue protrudes centrally and palate elevates symmetrically.   Chest:Clear to auscultation without wheezing, rhonchi or crackles noted.  Heart:S1+S2+0, regular and normal without murmurs, rubs or gallops noted.   Abdomen:Soft, non-tender and non-distended with normal bowel sounds appreciated on auscultation.  Extremities:There is no pitting edema in the distal lower extremities bilaterally.   Skin: Warm and dry without trophic changes noted.   Musculoskeletal: exam reveals no obvious joint deformities, tenderness or joint swelling or erythema.   Neurologically:  Mental status: The patient is awake, alert and oriented in all 4 spheres. His immediate and remote memory, attention, language skills and fund of knowledge are appropriate. There is no evidence of aphasia, agnosia, apraxia or anomia. Speech is clear with normal prosody and enunciation. Thought process is linear. Mood is normal and affect is normal.  Cranial nerves II - XII are as described above under HEENT exam. In addition:  shoulder shrug is normal with equal shoulder height noted. Motor exam: Normal bulk, strength and tone is noted. There is no drift, tremor or rebound. Fine motor skills and coordination: intact in the UEs and LEs.  Sensory exam: intact to light touch in the upper and lower extremities.  Gait, station and balance: He stands easily. No veering to one side is noted. No leaning to one side is noted. Posture is age-appropriate and stance is narrow based. Gait shows normal stride length and normal pace. No problems turning are noted. Tandem walk is unremarkable.   Assessment and Plan:   In summary, CYLE KENYON is a very pleasant 67 year old male with an underlying medical history of depression and anxiety, recurrent UTIs, remote Hx of smoking, mild obesity, and nephrolithiasis, arthritis, and back pain, who presents for followup consultation of his obstructive sleep apnea, on treatment with CPAP at a pressure of 8 cwp. He has previously been compliant with treatment, in the past few months he has had big lapses and treatment. He reports issues with the machine itself. He is advised to continue to work with his DME company and also let us know if there are any additional issues that we can help with. He is advised to continue with Effexor long-acting once daily 75 mg strength and I renewed the prescription. I also renewed a one-time prescription for his Flonase. He is on Ambien generic 10 mg strength as needed through his primary care physician. He is advised that given that he takes a sleep aid it is all the more important that he be completely compliant with his CPAP therapy. He may be eligible for a new machine in about 6 months as it will be 5 years since he started CPAP therapy. He had a split-night sleep study in March 2015. I suggested a routine follow-up in 1 year. I answered all his questions today and he was in agreement. I spent 20 minutes in total face-to-face time with the patient, more than 50% of  which was spent in counseling and coordination of care, reviewing test results, reviewing medication and discussing or reviewing the diagnosis of OSA, its prognosis and treatment options. Pertinent laboratory and imaging test results that were available during this visit with the patient were reviewed by me and  considered in my medical decision making (see chart for details).

## 2018-05-12 ENCOUNTER — Other Ambulatory Visit: Payer: Self-pay | Admitting: Neurology

## 2018-05-12 DIAGNOSIS — G4700 Insomnia, unspecified: Secondary | ICD-10-CM

## 2018-05-12 DIAGNOSIS — G4733 Obstructive sleep apnea (adult) (pediatric): Secondary | ICD-10-CM

## 2018-05-12 DIAGNOSIS — Z9989 Dependence on other enabling machines and devices: Secondary | ICD-10-CM

## 2018-05-12 DIAGNOSIS — G4730 Sleep apnea, unspecified: Principal | ICD-10-CM

## 2018-05-21 DIAGNOSIS — Z125 Encounter for screening for malignant neoplasm of prostate: Secondary | ICD-10-CM | POA: Diagnosis not present

## 2018-05-21 DIAGNOSIS — Z1389 Encounter for screening for other disorder: Secondary | ICD-10-CM | POA: Diagnosis not present

## 2018-05-21 DIAGNOSIS — E785 Hyperlipidemia, unspecified: Secondary | ICD-10-CM | POA: Diagnosis not present

## 2018-05-21 DIAGNOSIS — E748 Other specified disorders of carbohydrate metabolism: Secondary | ICD-10-CM | POA: Diagnosis not present

## 2018-05-21 DIAGNOSIS — Z6828 Body mass index (BMI) 28.0-28.9, adult: Secondary | ICD-10-CM | POA: Diagnosis not present

## 2018-05-21 DIAGNOSIS — Z23 Encounter for immunization: Secondary | ICD-10-CM | POA: Diagnosis not present

## 2018-05-21 DIAGNOSIS — R946 Abnormal results of thyroid function studies: Secondary | ICD-10-CM | POA: Diagnosis not present

## 2018-05-21 DIAGNOSIS — R7309 Other abnormal glucose: Secondary | ICD-10-CM | POA: Diagnosis not present

## 2018-05-21 DIAGNOSIS — Z0001 Encounter for general adult medical examination with abnormal findings: Secondary | ICD-10-CM | POA: Diagnosis not present

## 2018-05-25 DIAGNOSIS — M1711 Unilateral primary osteoarthritis, right knee: Secondary | ICD-10-CM | POA: Diagnosis not present

## 2018-05-26 DIAGNOSIS — H401132 Primary open-angle glaucoma, bilateral, moderate stage: Secondary | ICD-10-CM | POA: Diagnosis not present

## 2018-06-03 DIAGNOSIS — M47812 Spondylosis without myelopathy or radiculopathy, cervical region: Secondary | ICD-10-CM | POA: Diagnosis not present

## 2018-06-03 DIAGNOSIS — M5414 Radiculopathy, thoracic region: Secondary | ICD-10-CM | POA: Diagnosis not present

## 2018-07-12 ENCOUNTER — Other Ambulatory Visit: Payer: Self-pay | Admitting: Neurology

## 2018-07-14 ENCOUNTER — Ambulatory Visit (INDEPENDENT_AMBULATORY_CARE_PROVIDER_SITE_OTHER): Payer: Medicare Other | Admitting: Internal Medicine

## 2018-07-14 ENCOUNTER — Encounter (INDEPENDENT_AMBULATORY_CARE_PROVIDER_SITE_OTHER): Payer: Self-pay | Admitting: Internal Medicine

## 2018-07-14 NOTE — Progress Notes (Signed)
Patient canceled his yearly follow up for medication refills

## 2018-09-13 DIAGNOSIS — M5414 Radiculopathy, thoracic region: Secondary | ICD-10-CM | POA: Diagnosis not present

## 2018-10-06 DIAGNOSIS — M546 Pain in thoracic spine: Secondary | ICD-10-CM | POA: Diagnosis not present

## 2018-10-06 DIAGNOSIS — M5414 Radiculopathy, thoracic region: Secondary | ICD-10-CM | POA: Diagnosis not present

## 2018-10-06 DIAGNOSIS — M5124 Other intervertebral disc displacement, thoracic region: Secondary | ICD-10-CM | POA: Diagnosis not present

## 2018-10-28 ENCOUNTER — Telehealth: Payer: Self-pay | Admitting: Neurology

## 2018-10-28 MED ORDER — ZOLPIDEM TARTRATE 10 MG PO TABS: 10.0000 mg | ORAL_TABLET | Freq: Every evening | ORAL | 0 refills | Status: DC | PRN

## 2018-10-28 NOTE — Telephone Encounter (Signed)
Pt returned call to discuss ambien refil

## 2018-10-28 NOTE — Telephone Encounter (Signed)
Pt has called for a refill on his zolpidem (AMBIEN) 10 MG tablet Miranda

## 2018-10-28 NOTE — Telephone Encounter (Signed)
I called pt, advised him of this, Pt verbalized understanding.

## 2018-10-28 NOTE — Telephone Encounter (Signed)
Per [place] [facility], Joel Greene, [place] and Dr. Gerarda Fraction have been refilling pt's ambien.  I called pt to discuss. No answer, left a message asking him to call me back.

## 2018-10-28 NOTE — Telephone Encounter (Signed)
I called pt. He reports that his PCP office has been ordering his Lorrin Mais for him but his PCP office is closed and they are only doing "drive thrus." He wants to know if Dr. Rexene Alberts will refill his Lorrin Mais.   Per [place] [facility], pt last got a refill on 09/23/2018 of ambien from his PCP office.

## 2018-10-28 NOTE — Telephone Encounter (Signed)
Refilled ambien x 1 mon, no ref.

## 2018-10-28 NOTE — Addendum Note (Signed)
Addended by: [name] on: 10/28/2018 05:05 PM   Modules accepted: Orders

## 2018-11-27 DIAGNOSIS — H401132 Primary open-angle glaucoma, bilateral, moderate stage: Secondary | ICD-10-CM | POA: Diagnosis not present

## 2018-12-18 ENCOUNTER — Encounter (HOSPITAL_COMMUNITY): Payer: Self-pay | Admitting: Emergency Medicine

## 2018-12-18 ENCOUNTER — Emergency Department (HOSPITAL_COMMUNITY): Payer: Medicare Other

## 2018-12-18 ENCOUNTER — Other Ambulatory Visit: Payer: Self-pay

## 2018-12-18 ENCOUNTER — Inpatient Hospital Stay (HOSPITAL_COMMUNITY)
Admission: EM | Admit: 2018-12-18 | Discharge: 2019-01-13 | DRG: 871 | Disposition: A | Discharge status: Skilled Nursing Facility | Payer: Medicare Other | Attending: Internal Medicine | Admitting: Internal Medicine

## 2018-12-18 DIAGNOSIS — G8929 Other chronic pain: Secondary | ICD-10-CM | POA: Diagnosis present

## 2018-12-18 DIAGNOSIS — R338 Other retention of urine: Secondary | ICD-10-CM | POA: Diagnosis present

## 2018-12-18 DIAGNOSIS — Z881 Allergy status to other antibiotic agents status: Secondary | ICD-10-CM | POA: Diagnosis not present

## 2018-12-18 DIAGNOSIS — J9601 Acute respiratory failure with hypoxia: Secondary | ICD-10-CM | POA: Diagnosis present

## 2018-12-18 DIAGNOSIS — A419 Sepsis, unspecified organism: Secondary | ICD-10-CM

## 2018-12-18 DIAGNOSIS — M1990 Unspecified osteoarthritis, unspecified site: Secondary | ICD-10-CM | POA: Diagnosis present

## 2018-12-18 DIAGNOSIS — J069 Acute upper respiratory infection, unspecified: Secondary | ICD-10-CM | POA: Diagnosis not present

## 2018-12-18 DIAGNOSIS — E1165 Type 2 diabetes mellitus with hyperglycemia: Secondary | ICD-10-CM | POA: Diagnosis not present

## 2018-12-18 DIAGNOSIS — I110 Hypertensive heart disease with heart failure: Secondary | ICD-10-CM | POA: Diagnosis present

## 2018-12-18 DIAGNOSIS — I5043 Acute on chronic combined systolic (congestive) and diastolic (congestive) heart failure: Secondary | ICD-10-CM | POA: Diagnosis not present

## 2018-12-18 DIAGNOSIS — J1289 Other viral pneumonia: Secondary | ICD-10-CM | POA: Diagnosis present

## 2018-12-18 DIAGNOSIS — Z01818 Encounter for other preprocedural examination: Secondary | ICD-10-CM

## 2018-12-18 DIAGNOSIS — I2510 Atherosclerotic heart disease of native coronary artery without angina pectoris: Secondary | ICD-10-CM | POA: Diagnosis present

## 2018-12-18 DIAGNOSIS — F329 Major depressive disorder, single episode, unspecified: Secondary | ICD-10-CM | POA: Diagnosis present

## 2018-12-18 DIAGNOSIS — Z7982 Long term (current) use of aspirin: Secondary | ICD-10-CM

## 2018-12-18 DIAGNOSIS — Z886 Allergy status to analgesic agent status: Secondary | ICD-10-CM

## 2018-12-18 DIAGNOSIS — Z803 Family history of malignant neoplasm of breast: Secondary | ICD-10-CM

## 2018-12-18 DIAGNOSIS — R0602 Shortness of breath: Secondary | ICD-10-CM

## 2018-12-18 DIAGNOSIS — Z209 Contact with and (suspected) exposure to unspecified communicable disease: Secondary | ICD-10-CM | POA: Diagnosis not present

## 2018-12-18 DIAGNOSIS — M6281 Muscle weakness (generalized): Secondary | ICD-10-CM | POA: Diagnosis not present

## 2018-12-18 DIAGNOSIS — R079 Chest pain, unspecified: Secondary | ICD-10-CM | POA: Diagnosis not present

## 2018-12-18 DIAGNOSIS — Z9981 Dependence on supplemental oxygen: Secondary | ICD-10-CM

## 2018-12-18 DIAGNOSIS — Z4682 Encounter for fitting and adjustment of non-vascular catheter: Secondary | ICD-10-CM | POA: Diagnosis not present

## 2018-12-18 DIAGNOSIS — Z23 Encounter for immunization: Secondary | ICD-10-CM

## 2018-12-18 DIAGNOSIS — Z91013 Allergy to seafood: Secondary | ICD-10-CM

## 2018-12-18 DIAGNOSIS — A403 Sepsis due to Streptococcus pneumoniae: Secondary | ICD-10-CM | POA: Diagnosis not present

## 2018-12-18 DIAGNOSIS — J189 Pneumonia, unspecified organism: Secondary | ICD-10-CM | POA: Diagnosis not present

## 2018-12-18 DIAGNOSIS — T380X5A Adverse effect of glucocorticoids and synthetic analogues, initial encounter: Secondary | ICD-10-CM | POA: Diagnosis not present

## 2018-12-18 DIAGNOSIS — R0689 Other abnormalities of breathing: Secondary | ICD-10-CM | POA: Diagnosis not present

## 2018-12-18 DIAGNOSIS — N401 Enlarged prostate with lower urinary tract symptoms: Secondary | ICD-10-CM | POA: Diagnosis present

## 2018-12-18 DIAGNOSIS — J13 Pneumonia due to Streptococcus pneumoniae: Secondary | ICD-10-CM | POA: Diagnosis not present

## 2018-12-18 DIAGNOSIS — D6959 Other secondary thrombocytopenia: Secondary | ICD-10-CM | POA: Diagnosis not present

## 2018-12-18 DIAGNOSIS — R001 Bradycardia, unspecified: Secondary | ICD-10-CM | POA: Diagnosis present

## 2018-12-18 DIAGNOSIS — F419 Anxiety disorder, unspecified: Secondary | ICD-10-CM | POA: Diagnosis present

## 2018-12-18 DIAGNOSIS — I248 Other forms of acute ischemic heart disease: Secondary | ICD-10-CM | POA: Diagnosis present

## 2018-12-18 DIAGNOSIS — J470 Bronchiectasis with acute lower respiratory infection: Secondary | ICD-10-CM | POA: Diagnosis present

## 2018-12-18 DIAGNOSIS — R451 Restlessness and agitation: Secondary | ICD-10-CM | POA: Diagnosis not present

## 2018-12-18 DIAGNOSIS — G4733 Obstructive sleep apnea (adult) (pediatric): Secondary | ICD-10-CM | POA: Diagnosis present

## 2018-12-18 DIAGNOSIS — K5900 Constipation, unspecified: Secondary | ICD-10-CM | POA: Diagnosis not present

## 2018-12-18 DIAGNOSIS — R140 Abdominal distension (gaseous): Secondary | ICD-10-CM | POA: Diagnosis not present

## 2018-12-18 DIAGNOSIS — Z7401 Bed confinement status: Secondary | ICD-10-CM | POA: Diagnosis not present

## 2018-12-18 DIAGNOSIS — E878 Other disorders of electrolyte and fluid balance, not elsewhere classified: Secondary | ICD-10-CM | POA: Diagnosis present

## 2018-12-18 DIAGNOSIS — K589 Irritable bowel syndrome without diarrhea: Secondary | ICD-10-CM | POA: Diagnosis present

## 2018-12-18 DIAGNOSIS — R0902 Hypoxemia: Secondary | ICD-10-CM

## 2018-12-18 DIAGNOSIS — R2681 Unsteadiness on feet: Secondary | ICD-10-CM | POA: Diagnosis not present

## 2018-12-18 DIAGNOSIS — B370 Candidal stomatitis: Secondary | ICD-10-CM | POA: Diagnosis not present

## 2018-12-18 DIAGNOSIS — R498 Other voice and resonance disorders: Secondary | ICD-10-CM | POA: Diagnosis not present

## 2018-12-18 DIAGNOSIS — Z87891 Personal history of nicotine dependence: Secondary | ICD-10-CM

## 2018-12-18 DIAGNOSIS — Z8249 Family history of ischemic heart disease and other diseases of the circulatory system: Secondary | ICD-10-CM

## 2018-12-18 DIAGNOSIS — J9600 Acute respiratory failure, unspecified whether with hypoxia or hypercapnia: Secondary | ICD-10-CM | POA: Diagnosis not present

## 2018-12-18 DIAGNOSIS — J40 Bronchitis, not specified as acute or chronic: Secondary | ICD-10-CM | POA: Diagnosis not present

## 2018-12-18 DIAGNOSIS — J984 Other disorders of lung: Secondary | ICD-10-CM | POA: Diagnosis not present

## 2018-12-18 DIAGNOSIS — Z66 Do not resuscitate: Secondary | ICD-10-CM | POA: Diagnosis present

## 2018-12-18 DIAGNOSIS — J9620 Acute and chronic respiratory failure, unspecified whether with hypoxia or hypercapnia: Secondary | ICD-10-CM | POA: Diagnosis not present

## 2018-12-18 DIAGNOSIS — Z95828 Presence of other vascular implants and grafts: Secondary | ICD-10-CM | POA: Diagnosis not present

## 2018-12-18 DIAGNOSIS — A4189 Other specified sepsis: Principal | ICD-10-CM | POA: Diagnosis present

## 2018-12-18 DIAGNOSIS — E876 Hypokalemia: Secondary | ICD-10-CM | POA: Diagnosis present

## 2018-12-18 DIAGNOSIS — U071 COVID-19: Secondary | ICD-10-CM

## 2018-12-18 DIAGNOSIS — J1282 Pneumonia due to coronavirus disease 2019: Secondary | ICD-10-CM

## 2018-12-18 DIAGNOSIS — Z9289 Personal history of other medical treatment: Secondary | ICD-10-CM

## 2018-12-18 DIAGNOSIS — R6520 Severe sepsis without septic shock: Secondary | ICD-10-CM | POA: Diagnosis not present

## 2018-12-18 DIAGNOSIS — R000 Tachycardia, unspecified: Secondary | ICD-10-CM | POA: Diagnosis not present

## 2018-12-18 DIAGNOSIS — E785 Hyperlipidemia, unspecified: Secondary | ICD-10-CM | POA: Diagnosis present

## 2018-12-18 DIAGNOSIS — R0600 Dyspnea, unspecified: Secondary | ICD-10-CM | POA: Diagnosis not present

## 2018-12-18 DIAGNOSIS — M2550 Pain in unspecified joint: Secondary | ICD-10-CM | POA: Diagnosis not present

## 2018-12-18 DIAGNOSIS — K219 Gastro-esophageal reflux disease without esophagitis: Secondary | ICD-10-CM | POA: Diagnosis present

## 2018-12-18 DIAGNOSIS — Z8711 Personal history of peptic ulcer disease: Secondary | ICD-10-CM

## 2018-12-18 DIAGNOSIS — R918 Other nonspecific abnormal finding of lung field: Secondary | ICD-10-CM | POA: Diagnosis not present

## 2018-12-18 DIAGNOSIS — H409 Unspecified glaucoma: Secondary | ICD-10-CM | POA: Diagnosis present

## 2018-12-18 DIAGNOSIS — F418 Other specified anxiety disorders: Secondary | ICD-10-CM | POA: Diagnosis present

## 2018-12-18 DIAGNOSIS — R7881 Bacteremia: Secondary | ICD-10-CM | POA: Diagnosis not present

## 2018-12-18 DIAGNOSIS — R2689 Other abnormalities of gait and mobility: Secondary | ICD-10-CM | POA: Diagnosis not present

## 2018-12-18 DIAGNOSIS — Z79899 Other long term (current) drug therapy: Secondary | ICD-10-CM

## 2018-12-18 DIAGNOSIS — F410 Panic disorder [episodic paroxysmal anxiety] without agoraphobia: Secondary | ICD-10-CM | POA: Diagnosis not present

## 2018-12-18 LAB — COMPREHENSIVE METABOLIC PANEL
ALT: 119 U/L — ABNORMAL HIGH (ref 0–44)
AST: 110 U/L — ABNORMAL HIGH (ref 15–41)
Albumin: 2.7 g/dL — ABNORMAL LOW (ref 3.5–5.0)
Alkaline Phosphatase: 108 U/L (ref 38–126)
Anion gap: 11 (ref 5–15)
BUN: 19 mg/dL (ref 8–23)
CO2: 31 mmol/L (ref 22–32)
Calcium: 8.6 mg/dL — ABNORMAL LOW (ref 8.9–10.3)
Chloride: 90 mmol/L — ABNORMAL LOW (ref 98–111)
Creatinine, Ser: 0.91 mg/dL (ref 0.61–1.24)
GFR calc Af Amer: >60 mL/min (ref >60)
GFR calc non Af Amer: >60 mL/min (ref >60)
Glucose, Bld: 151 mg/dL — ABNORMAL HIGH (ref 70–99)
Potassium: 2.8 mmol/L — ABNORMAL LOW (ref 3.5–5.1)
Sodium: 132 mmol/L — ABNORMAL LOW (ref 135–145)
Total Bilirubin: 1.2 mg/dL (ref 0.3–1.2)
Total Protein: 6.7 g/dL (ref 6.5–8.1)

## 2018-12-18 LAB — URINALYSIS, ROUTINE W REFLEX MICROSCOPIC
Bilirubin Urine: NEGATIVE
Glucose, UA: NEGATIVE mg/dL
Hgb urine dipstick: NEGATIVE
Ketones, ur: NEGATIVE mg/dL
Leukocytes,Ua: NEGATIVE
Nitrite: NEGATIVE
Protein, ur: 30 mg/dL — AB
Specific Gravity, Urine: 1.019 (ref 1.005–1.030)
pH: 6 (ref 5.0–8.0)

## 2018-12-18 LAB — CBC WITH DIFFERENTIAL/PLATELET
Abs Immature Granulocytes: 0.04 10*3/uL (ref 0.00–0.07)
Basophils Absolute: 0 10*3/uL (ref 0.0–0.1)
Basophils Relative: 0 %
Eosinophils Absolute: 0 10*3/uL (ref 0.0–0.5)
Eosinophils Relative: 0 %
HCT: 38.5 % — ABNORMAL LOW (ref 39.0–52.0)
Hemoglobin: 14 g/dL (ref 13.0–17.0)
Immature Granulocytes: 1 %
Lymphocytes Relative: 4 %
Lymphs Abs: 0.3 10*3/uL — ABNORMAL LOW (ref 0.7–4.0)
MCH: 33.4 pg (ref 26.0–34.0)
MCHC: 36.4 g/dL — ABNORMAL HIGH (ref 30.0–36.0)
MCV: 91.9 fL (ref 80.0–100.0)
Monocytes Absolute: 0.2 10*3/uL (ref 0.1–1.0)
Monocytes Relative: 3 %
Neutro Abs: 6.4 10*3/uL (ref 1.7–7.7)
Neutrophils Relative %: 92 %
Platelets: 304 10*3/uL (ref 150–400)
RBC: 4.19 MIL/uL — ABNORMAL LOW (ref 4.22–5.81)
RDW: 12.1 % (ref 11.5–15.5)
WBC: 6.9 10*3/uL (ref 4.0–10.5)
nRBC: 0 % (ref 0.0–0.2)

## 2018-12-18 LAB — C-REACTIVE PROTEIN: CRP: >20 mg/dL — ABNORMAL HIGH (ref <1.0)

## 2018-12-18 LAB — FERRITIN: Ferritin: 2370 ng/mL — ABNORMAL HIGH (ref 24–336)

## 2018-12-18 LAB — LACTIC ACID, PLASMA
Lactic Acid, Venous: 3.5 mmol/L (ref 0.5–1.9)
Lactic Acid, Venous: 2.3 mmol/L (ref 0.5–1.9)

## 2018-12-18 LAB — BRAIN NATRIURETIC PEPTIDE: B Natriuretic Peptide: 132 pg/mL — ABNORMAL HIGH (ref 0.0–100.0)

## 2018-12-18 LAB — D-DIMER, QUANTITATIVE: D-Dimer, Quant: 8.4 ug/mL-FEU — ABNORMAL HIGH (ref 0.00–0.50)

## 2018-12-18 LAB — FIBRINOGEN: Fibrinogen: >800 mg/dL — ABNORMAL HIGH (ref 210–475)

## 2018-12-18 LAB — LIPASE, BLOOD: Lipase: 51 U/L (ref 11–51)

## 2018-12-18 LAB — SEDIMENTATION RATE: Sed Rate: 90 mm/hr — ABNORMAL HIGH (ref 0–16)

## 2018-12-18 LAB — LACTATE DEHYDROGENASE: LDH: 535 U/L — ABNORMAL HIGH (ref 98–192)

## 2018-12-18 LAB — TROPONIN I: Troponin I: 0.03 ng/mL (ref <0.03)

## 2018-12-18 LAB — PROCALCITONIN: Procalcitonin: 1.24 ng/mL

## 2018-12-18 LAB — SARS CORONAVIRUS 2 BY RT PCR (HOSPITAL ORDER, PERFORMED IN ~~LOC~~ HOSPITAL LAB): SARS Coronavirus 2: POSITIVE — AB

## 2018-12-18 LAB — CK: Total CK: 35 U/L — ABNORMAL LOW (ref 49–397)

## 2018-12-18 MED ORDER — LATANOPROST 0.005 % OP SOLN
1.0000 [drp] | Freq: Every day | OPHTHALMIC | Status: DC
  Administered 2018-12-19 – 2019-01-12 (×25): 1 [drp] via OPHTHALMIC
  Filled 2018-12-18 (×4): qty 2.5

## 2018-12-18 MED ORDER — VITAMIN D3 25 MCG (1000 UNIT) PO TABS
5000.0000 [IU] | ORAL_TABLET | Freq: Every day | ORAL | Status: DC
  Administered 2018-12-19 – 2019-01-13 (×26): 5000 [IU] via ORAL
  Filled 2018-12-18 (×43): qty 5

## 2018-12-18 MED ORDER — PANTOPRAZOLE SODIUM 40 MG PO TBEC
40.0000 mg | DELAYED_RELEASE_TABLET | Freq: Two times a day (BID) | ORAL | Status: DC
  Administered 2018-12-19: 40 mg via ORAL
  Filled 2018-12-18: qty 1

## 2018-12-18 MED ORDER — POTASSIUM CHLORIDE 10 MEQ/100ML IV SOLN
10.0000 meq | Freq: Once | INTRAVENOUS | Status: AC
  Administered 2018-12-18: 10 meq via INTRAVENOUS
  Filled 2018-12-18: qty 100

## 2018-12-18 MED ORDER — METHYLPREDNISOLONE SODIUM SUCC 125 MG IJ SOLR
60.0000 mg | Freq: Once | INTRAMUSCULAR | Status: DC
  Administered 2018-12-18: 60 mg via INTRAVENOUS

## 2018-12-18 MED ORDER — ATORVASTATIN CALCIUM 10 MG PO TABS
20.0000 mg | ORAL_TABLET | Freq: Every day | ORAL | Status: DC
  Administered 2018-12-18 – 2018-12-19 (×2): 20 mg via ORAL
  Filled 2018-12-18 (×2): qty 2

## 2018-12-18 MED ORDER — TAMSULOSIN HCL 0.4 MG PO CAPS
0.4000 mg | ORAL_CAPSULE | Freq: Every day | ORAL | Status: DC
  Administered 2018-12-19 – 2019-01-13 (×26): 0.4 mg via ORAL
  Filled 2018-12-18 (×30): qty 1

## 2018-12-18 MED ORDER — FUROSEMIDE 10 MG/ML IJ SOLN
20.0000 mg | Freq: Once | INTRAMUSCULAR | Status: AC
  Administered 2018-12-18: 20 mg via INTRAVENOUS
  Filled 2018-12-18: qty 2

## 2018-12-18 MED ORDER — METHYLPREDNISOLONE SODIUM SUCC 40 MG IJ SOLR
40.0000 mg | Freq: Two times a day (BID) | INTRAMUSCULAR | Status: DC
  Administered 2018-12-18 – 2018-12-25 (×14): 40 mg via INTRAVENOUS
  Filled 2018-12-18 (×15): qty 1

## 2018-12-18 MED ORDER — METHYLPREDNISOLONE SODIUM SUCC 40 MG IJ SOLR
40.0000 mg | Freq: Once | INTRAMUSCULAR | Status: DC
  Filled 2018-12-18: qty 1

## 2018-12-18 MED ORDER — ONDANSETRON HCL 4 MG PO TABS
4.0000 mg | ORAL_TABLET | Freq: Four times a day (QID) | ORAL | Status: DC | PRN
  Administered 2018-12-21: 4 mg via ORAL
  Filled 2018-12-18: qty 1

## 2018-12-18 MED ORDER — ASPIRIN EC 81 MG PO TBEC
81.0000 mg | DELAYED_RELEASE_TABLET | Freq: Every day | ORAL | Status: DC
  Administered 2018-12-18 – 2018-12-19 (×2): 81 mg via ORAL
  Filled 2018-12-18 (×2): qty 1

## 2018-12-18 MED ORDER — POLYVINYL ALCOHOL 1.4 % OP SOLN
Freq: Every day | OPHTHALMIC | Status: DC | PRN
  Filled 2018-12-18: qty 15

## 2018-12-18 MED ORDER — ALBUTEROL SULFATE HFA 108 (90 BASE) MCG/ACT IN AERS
INHALATION_SPRAY | RESPIRATORY_TRACT | Status: AC
  Administered 2018-12-18: 8 via RESPIRATORY_TRACT
  Filled 2018-12-18: qty 6.7

## 2018-12-18 MED ORDER — ACETAMINOPHEN 650 MG RE SUPP
650.0000 mg | Freq: Once | RECTAL | Status: AC
  Administered 2018-12-18: 650 mg via RECTAL
  Filled 2018-12-18: qty 1

## 2018-12-18 MED ORDER — BUSPIRONE HCL 10 MG PO TABS
10.0000 mg | ORAL_TABLET | Freq: Two times a day (BID) | ORAL | Status: DC
  Administered 2018-12-19: 10 mg via ORAL
  Filled 2018-12-18 (×3): qty 1

## 2018-12-18 MED ORDER — ENOXAPARIN SODIUM 40 MG/0.4ML ~~LOC~~ SOLN
40.0000 mg | SUBCUTANEOUS | Status: DC
  Administered 2018-12-18: 40 mg via SUBCUTANEOUS
  Filled 2018-12-18: qty 0.4

## 2018-12-18 MED ORDER — PIPERACILLIN-TAZOBACTAM 3.375 G IVPB 30 MIN
3.3750 g | Freq: Once | INTRAVENOUS | Status: AC
  Administered 2018-12-18: 3.375 g via INTRAVENOUS
  Filled 2018-12-18: qty 50

## 2018-12-18 MED ORDER — VANCOMYCIN HCL IN DEXTROSE 1-5 GM/200ML-% IV SOLN
1000.0000 mg | Freq: Once | INTRAVENOUS | Status: AC
  Administered 2018-12-18: 1000 mg via INTRAVENOUS
  Filled 2018-12-18: qty 200

## 2018-12-18 MED ORDER — SODIUM CHLORIDE 0.9 % IV BOLUS
2000.0000 mL | Freq: Once | INTRAVENOUS | Status: AC
  Administered 2018-12-18: 2000 mL via INTRAVENOUS

## 2018-12-18 MED ORDER — PROPOFOL 1000 MG/100ML IV EMUL
INTRAVENOUS | Status: AC
  Administered 2018-12-19: 50 ug/kg/min via INTRAVENOUS
  Filled 2018-12-18: qty 100

## 2018-12-18 MED ORDER — VENLAFAXINE HCL ER 75 MG PO CP24
75.0000 mg | ORAL_CAPSULE | Freq: Every day | ORAL | Status: DC
  Administered 2018-12-19 – 2018-12-20 (×2): 75 mg via ORAL
  Filled 2018-12-18 (×2): qty 1

## 2018-12-18 MED ORDER — CYCLOBENZAPRINE HCL 10 MG PO TABS
10.0000 mg | ORAL_TABLET | Freq: Three times a day (TID) | ORAL | Status: DC | PRN
  Administered 2018-12-19 – 2018-12-20 (×2): 10 mg via ORAL
  Filled 2018-12-18 (×3): qty 1

## 2018-12-18 MED ORDER — GABAPENTIN 300 MG PO CAPS
300.0000 mg | ORAL_CAPSULE | Freq: Three times a day (TID) | ORAL | Status: DC
  Administered 2018-12-18: 600 mg via ORAL
  Filled 2018-12-18 (×3): qty 2

## 2018-12-18 MED ORDER — ONDANSETRON HCL 4 MG/2ML IJ SOLN
4.0000 mg | Freq: Four times a day (QID) | INTRAMUSCULAR | Status: DC | PRN
  Administered 2018-12-20 – 2018-12-23 (×3): 4 mg via INTRAVENOUS
  Filled 2018-12-18 (×3): qty 2

## 2018-12-18 MED ORDER — ADULT MULTIVITAMIN W/MINERALS CH
1.0000 | ORAL_TABLET | Freq: Every day | ORAL | Status: DC
  Administered 2018-12-18 – 2018-12-19 (×2): 1 via ORAL
  Filled 2018-12-18 (×2): qty 1

## 2018-12-18 MED ORDER — ONDANSETRON HCL 4 MG/2ML IJ SOLN
4.0000 mg | Freq: Once | INTRAMUSCULAR | Status: AC
  Administered 2018-12-18: 4 mg via INTRAVENOUS
  Filled 2018-12-18: qty 2

## 2018-12-18 MED ORDER — ALBUTEROL SULFATE HFA 108 (90 BASE) MCG/ACT IN AERS
8.0000 | INHALATION_SPRAY | Freq: Once | RESPIRATORY_TRACT | Status: AC
  Administered 2018-12-18: 8 via RESPIRATORY_TRACT

## 2018-12-18 NOTE — ED Notes (Signed)
Covid,  blood to lab   Rad in   resp in

## 2018-12-18 NOTE — ED Notes (Signed)
SO Heloise Beecham number obtained and she is spoken o by [place], Therapist, sports, CN

## 2018-12-18 NOTE — ED Notes (Signed)
Pt transfer delayed due to [facility] "covid" turck  "down one"  Pt informed of delay

## 2018-12-18 NOTE — ED Notes (Signed)
Report to [place], [place]

## 2018-12-18 NOTE — ED Notes (Signed)
Pt insist on standing to urinate  His sats drop to mid 80s when he stands  Upon retuning to lying position, sats climb to low 90s

## 2018-12-18 NOTE — Progress Notes (Signed)
Pharmacy Brief Note   O:  ALT: 119 CXR: Cardiomegaly with bilateral interstitial and scattered airspace opacities, RIGHT greater than LEFT - question pneumonia versus pulmonary edema. SpO2: 98% on 15L HFNC   A/P:  Patient meets criteria for remdesivir.  Will initiate remdesivir 200 mg once followed by 100 mg daily x 4 days once he arrives from [facility] to [place].   Gretta Arab PharmD, BCPS Clinical Pharmacist Clinical pharmacist phone 7am- [phone] 12/18/2018 4:28 PM

## 2018-12-18 NOTE — ED Notes (Signed)
hospitalist to bedside

## 2018-12-18 NOTE — ED Notes (Signed)
POs given to pt

## 2018-12-18 NOTE — ED Notes (Signed)
Called [facility] with Bed assignment and for transport  To [facility].

## 2018-12-18 NOTE — ED Notes (Signed)
Ginger ale and icee provided

## 2018-12-18 NOTE — ED Notes (Signed)
Solumedrol 40 given as ordered   Upon exiting room - Order had been changed

## 2018-12-18 NOTE — ED Notes (Signed)
Initial O2 sats 74 per cent on room air  Difficult to ascertain if pt is diminished due to hyperventilation and anxiousness  Or due to hypoxia   Covid test obtained - pt spitting on nurse while testing "I can't help it"  Pt in with no mask from EMS

## 2018-12-18 NOTE — ED Provider Notes (Addendum)
[facility] EMERGENCY DEPARTMENT Provider Note   CSN: [id number] Arrival date & time: 12/18/18  1259     History   Chief Complaint Chief Complaint  Patient presents with  . Shortness of Breath    HPI Joel Greene is a 68 y.o. male.     Patient complains of shortness of breath and weakness.  He has been sick for over a week with cough and fever  The history is provided by the patient. No language interpreter was used.  Shortness of Breath Severity:  Severe Onset quality:  Sudden Timing:  Constant Progression:  Worsening Chronicity:  New Context: activity   Relieved by:  Nothing Worsened by:  Nothing Ineffective treatments:  None tried Associated symptoms: no abdominal pain, no chest pain, no cough, no headaches and no rash     Past Medical History:  Diagnosis Date  . Bronchitis   . DJD (degenerative joint disease)   . Glaucoma   . History of tobacco abuse 10/23/2011   History of smoking for 10 years, a pack per day, quitting at age 56.  . IBS (irritable bowel syndrome)   . Leukopenia 10/23/2011   Decrease in lymphocytes dating back to 1995.  . PUD (peptic ulcer disease) Jan 2017   endoscopy  . Renal calculi   . Sleep apnea    on C-pap    Patient Active Problem List   Diagnosis Date Noted  . Non-cardiac chest pain 11/28/2015  . CVA tenderness 11/27/2015  . Acute coronary syndrome ([place])   . Elevated troponin 11/25/2015  . SOB (shortness of breath) 11/25/2015  . PUD (peptic ulcer disease) 11/25/2015  . Uncontrolled hypertension 11/25/2015  . DJD- spine injection 11/07/15 11/25/2015  . Bilateral flank pain 11/25/2015  . Nephrolithiasis 11/25/2015  . Anxiety 11/25/2015  . GERD (gastroesophageal reflux disease) 07/31/2015  . Abdominal pain 06/07/2015  . Leukopenia 10/23/2011  . History of tobacco abuse 10/23/2011    Past Surgical History:  Procedure Laterality Date  . APPENDECTOMY    . CARDIAC CATHETERIZATION N/A 11/27/2015   Procedure: Left Heart  Cath and Coronary Angiography;  Surgeon: Belva Crome, MD;  Location: [facility];  Service: Cardiovascular;  Laterality: N/A;  . COLONOSCOPY N/A 09/01/2012   Procedure: COLONOSCOPY;  Surgeon: [name], MD;  Location: [facility];  Service: Endoscopy;  Laterality: N/A;  1030  . CYSTOGRAPHY    . ESOPHAGOGASTRODUODENOSCOPY N/A 07/12/2015   Procedure: ESOPHAGOGASTRODUODENOSCOPY (EGD);  Surgeon: [name], MD;  Location: [facility];  Service: Endoscopy;  Laterality: N/A;  2:50 - moved to 1:00 - Ann to notify  . REFRACTIVE SURGERY     "to relieve glaucoma"  . ROTATOR CUFF REPAIR     bilateral shoulders  . SP FL GUIDE SPINAL INJ  May 2017        Home Medications    Prior to Admission medications   Medication Sig Start Date End Date Taking? Authorizing Provider  atorvastatin (LIPITOR) 20 MG tablet Take 1 tablet by mouth daily. 05/24/18  Yes [provider]  busPIRone (BUSPAR) 10 MG tablet Take 1 tablet by mouth 2 (two) times a day. 11/17/18  Yes [provider]  cyclobenzaprine (FLEXERIL) 10 MG tablet Take 10 mg by mouth 3 (three) times daily as needed for muscle spasms.   Yes [provider]  gabapentin (NEURONTIN) 300 MG capsule Take 300-600 mg by mouth 3 (three) times daily. Patient takes 1 capsule in the morning, 1 capsule in the  evening, and 2 capsules at night   Yes [provider]  latanoprost (XALATAN) 0.005 % ophthalmic solution Place 1 drop into both eyes at bedtime. 05/26/18  Yes [provider]  Oxycodone HCl 10 MG TABS Take 10 mg by mouth every 6 (six) hours as needed.   Yes [provider]  tiZANidine (ZANAFLEX) 4 MG tablet Take 4 mg by mouth every 8 (eight) hours as needed for muscle spasms.   Yes [provider]  venlafaxine XR (EFFEXOR-XR) 75 MG 24 hr capsule TAKE 1 CAPSULE ONCE A DAY WITH BREAKFAST. 04/27/18  Yes [name], MD  aspirin EC 81 MG tablet Take 81 mg by mouth daily.    [provider]  Cholecalciferol (VITAMIN D3) 125 MCG (5000 UT) TABS Take 1 capsule by mouth daily.    [provider]  fluticasone (FLONASE) 50 MCG/ACT nasal spray INHALE 2 SPRAYS IN EACH NOSTRIL ONCE DAILY. 07/12/18   [name], MD  ibuprofen (ADVIL,MOTRIN) 200 MG tablet Take 400 mg by mouth every 6 (six) hours as needed (pain).    [provider]  Misc Natural Products (COLON CLEANSE PO) Take 1 capsule by mouth daily as needed (constipation).    [provider]  Multiple Vitamin (MULITIVITAMIN WITH MINERALS) TABS Take 1 tablet by mouth daily.    [provider]  mupirocin ointment (BACTROBAN) 2 % Place 1 application into the nose at bedtime. For dryness from CPAP    [provider]  pantoprazole (PROTONIX) 40 MG tablet TAKE (1) TABLET BY MOUTH TWICE DAILY BEFORE MEALS (BREAKFAST AND SUPPER). 07/14/17   Setzer, Rona Ravens, NP  Polyethyl Glycol-Propyl Glycol (SYSTANE OP) Place 1 drop into both eyes daily as needed (seasonal allergies/ dry eyes).    [provider]  Probiotic Product (PROBIOTIC PO) Take 1 tablet by mouth daily.    [provider]  tamsulosin (FLOMAX) 0.4 MG CAPS capsule Take 0.4 mg by mouth daily after breakfast.  07/13/13   [provider]  zolpidem (AMBIEN) 10 MG tablet Take 1 tablet (10 mg total) by mouth at bedtime as needed for up to 30 days for sleep. 10/28/18 11/27/18  [name], MD    Family History Family History  Problem Relation Age of Onset  . Breast cancer Mother   . Hypertension Mother   . Kidney Stones Brother     Social History Social History   Tobacco Use  . Smoking status: Former Smoker    Packs/day: 2.00    Years: 15.00    Pack years: 30.00    Types: Cigarettes    Quit date: 07/01/1983    Years since quitting: 35.4  . Smokeless tobacco: Never Used  Substance Use Topics  . Alcohol use: Yes    Alcohol/week: 0.0 standard drinks    Comment: occasional  . Drug use: No      Allergies   Clams [shellfish allergy], Demeclocycline, Tetracyclines & related, and Tylenol [acetaminophen]   Review of Systems Review of Systems  Constitutional: Negative for appetite change and fatigue.  HENT: Negative for congestion, ear discharge and sinus pressure.   Eyes: Negative for discharge.  Respiratory: Positive for shortness of breath. Negative for cough.   Cardiovascular: Negative for chest pain.  Gastrointestinal: Negative for abdominal pain and diarrhea.  Genitourinary: Negative for frequency and hematuria.  Musculoskeletal: Negative for back pain.  Skin: Negative for rash.  Neurological: Negative for seizures and headaches.  Psychiatric/Behavioral: Negative for hallucinations.     Physical Exam Updated  Vital Signs BP 136/60   Pulse (!) 103   Temp (!) 100.6 F (38.1 C) (Rectal)   Resp (!) 30   Ht 5\' 7"  (1.702 m)   Wt 79.4 kg   SpO2 96%   BMI 27.41 kg/m   Physical Exam Constitutional:      Appearance: He is well-developed.  HENT:     Head: Normocephalic.     Nose: Nose normal.  Eyes:     General: No scleral icterus.    Conjunctiva/sclera: Conjunctivae normal.  Neck:     Musculoskeletal: Neck supple.     Thyroid: No thyromegaly.  Cardiovascular:     Rate and Rhythm: Normal rate and regular rhythm.     Heart sounds: No murmur. No friction rub. No gallop.   Pulmonary:     Breath sounds: No stridor. Wheezing present. No rales.  Chest:     Chest wall: No tenderness.  Abdominal:     General: There is no distension.     Tenderness: There is no abdominal tenderness. There is no rebound.  Musculoskeletal: Normal range of motion.  Lymphadenopathy:     Cervical: No cervical adenopathy.  Skin:    Findings: No erythema or rash.  Neurological:     Mental Status: He is alert and oriented to person, place, and time.     Motor: No abnormal muscle tone.     Coordination: Coordination normal.  Psychiatric:        Behavior: Behavior normal.      ED  Treatments / Results  Labs (all labs ordered are listed, but only abnormal results are displayed) Labs Reviewed  SARS CORONAVIRUS 2 ([facility]) - Abnormal; Notable for the following components:      Result Value   SARS Coronavirus 2 POSITIVE (*)    All other components within normal limits  CBC WITH DIFFERENTIAL/PLATELET - Abnormal; Notable for the following components:   RBC 4.19 (*)    HCT 38.5 (*)    MCHC 36.4 (*)    Lymphs Abs 0.3 (*)    All other components within normal limits  COMPREHENSIVE METABOLIC PANEL - Abnormal; Notable for the following components:   Sodium 132 (*)    Potassium 2.8 (*)    Chloride 90 (*)    Glucose, Bld 151 (*)    Calcium 8.6 (*)    Albumin 2.7 (*)    AST 110 (*)    ALT 119 (*)    All other components within normal limits  TROPONIN I - Abnormal; Notable for the following components:   Troponin I 0.03 (*)    All other components within normal limits  LACTIC ACID, PLASMA - Abnormal; Notable for the following components:   Lactic Acid, Venous 3.5 (*)    All other components within normal limits  BRAIN NATRIURETIC PEPTIDE - Abnormal; Notable for the following components:   B Natriuretic Peptide 132.0 (*)    All other components within normal limits  CULTURE, BLOOD (ROUTINE X 2)  CULTURE, BLOOD (ROUTINE X 2)  LACTIC ACID, PLASMA  URINALYSIS, ROUTINE W REFLEX MICROSCOPIC    EKG    Radiology Dg Chest Portable 1 View  Result Date: 12/18/2018 CLINICAL DATA:  68 year old male with shortness of breath EXAM: PORTABLE CHEST 1 VIEW COMPARISON:  11/28/2015 and prior radiographs FINDINGS: Cardiomegaly again noted. Bilateral interstitial and scattered airspace opacities, RIGHT greater than LEFT, are identified. No definite pleural effusion or pneumothorax. No acute bony abnormalities are noted. IMPRESSION:  Cardiomegaly with bilateral interstitial and scattered airspace opacities, RIGHT greater than LEFT - question  pneumonia versus pulmonary edema. Electronically Signed   By: Margarette Canada M.D.   On: 12/18/2018 14:33    Procedures Procedures (including critical care time)  Medications Ordered in ED Medications  vancomycin (VANCOCIN) IVPB 1000 mg/200 mL premix (1,000 mg Intravenous New Bag/Given 12/18/18 1413)  potassium chloride 10 mEq in 100 mL IVPB (has no administration in time range)  potassium chloride 10 mEq in 100 mL IVPB (has no administration in time range)  acetaminophen (TYLENOL) suppository 650 mg (650 mg Rectal Given 12/18/18 1416)  piperacillin-tazobactam (ZOSYN) IVPB 3.375 g (3.375 g Intravenous New Bag/Given 12/18/18 1414)  sodium chloride 0.9 % bolus 2,000 mL (2,000 mLs Intravenous New Bag/Given 12/18/18 1412)  ondansetron (ZOFRAN) injection 4 mg (4 mg Intravenous Given 12/18/18 1415)  albuterol (VENTOLIN HFA) 108 (90 Base) MCG/ACT inhaler 8 puff (8 puffs Inhalation Given 12/18/18 1346)     Initial Impression / Assessment and Plan / ED Course  I have reviewed the triage vital signs and the nursing notes.  Pertinent labs & imaging results that were available during my care of the patient were reviewed by me and considered in my medical decision making (see chart for details).    Joel Greene was evaluated in Emergency Department on 12/18/2018 for the symptoms described in the history of present illness. He was evaluated in the context of the global COVID-19 pandemic, which necessitated consideration that the patient might be at risk for infection with the SARS-CoV-2 virus that causes COVID-19. Institutional protocols and algorithms that pertain to the evaluation of patients at risk for COVID-19 are in a state of rapid change based on information released by regulatory bodies including the CDC and federal and state organizations. These policies and algorithms were followed during the patient's care in the ED.      CRITICAL CARE Performed by: [name] Total critical care time45 minutes  Critical care time was exclusive of separately billable procedures and treating other patients. Critical care was necessary to treat or prevent imminent or life-threatening deterioration. Critical care was time spent personally by me on the following activities: development of treatment plan with patient and/or surrogate as well as nursing, discussions with consultants, evaluation of patient's response to treatment, examination of patient, obtaining history from patient or surrogate, ordering and performing treatments and interventions, ordering and review of laboratory studies, ordering and review of radiographic studies, pulse oximetry and re-evaluation of patient's condition.  Patient with hypoxia and positive COVID test.  He is also hypokalemic.  He will be admitted Final Clinical Impressions(s) / ED Diagnoses   Final diagnoses:  None    ED Discharge Orders    None       [name], MD 12/18/18 1455    [name], MD 12/18/18 [phone]

## 2018-12-18 NOTE — ED Notes (Signed)
Critical Call from lab  Lactic acid 3.5  Dr Roderic Palau informed

## 2018-12-18 NOTE — H&P (Addendum)
History and Physical  Joel Greene XBD:[id number] DOB: 28-May-1951 DOA: 12/18/2018   PCP: Redmond School, MD   Patient coming from: Home  Chief Complaint: cough, sob  HPI:  Joel Greene is a 68 y.o. male with medical history of fevers, chills, coughing, shortness of breath, and generalized weakness for 10 days.  He denies any vomiting but has had some nausea.  He has had some intermittent loose stools but denies any frank diarrhea, hematochezia, melena.  He states that for the most part he has been abiding with stay home orders, but states that he has been out grocery shopping intermittently and went to the dentist in the past 2 weeks.  He denies any dysuria, hematuria.  He complains of some epigastric and right upper abdominal pain for the past 2 days.  In the emergency department, the patient had a temperature of 100.6 F with not tachycardia 100-110.  The patient has remained hemodynamically stable with systolic blood pressure 834-196.  The patient was initially hypoxic with oxygen saturation 74% room air.  He was placed on 12 L HFNC with oxygen saturation 96%.  Potassium was 2.8 with serum creatinine 0.91.  AST 110, ALT 119, alk phosphatase 108, total bilirubin 1.2.  WBC was 6.9, hemoglobin 14.0, platelets 204,000.  Lactic acid was 3.5.  Troponin was 0.03.  EKG showed sinus rhythm without any ST ST wave changes.  Assessment/Plan: Acute respiratory failure with hypoxia -Secondary to COVID 19 pulmonary manifestations -currently on 12L HFNC  COVID 19 Disease -Check ferritin, LDH, CRP, ESR, d-dimer, CK -Start Solu-Medrol 1 mg/kg divided bid -start Remdesivir  -lasix 20 mg IV x 1  Sepsis -present at time of admission -Lactic acid 3.5 -The patient presented with fever, tachycardia, respiratory failure, elevated lactic acid -Secondary to COVID-19 disease -Check procalcitonin -Start steroids and room to severe -Follow blood cultures -UA and urine culture  Coronary artery  disease -No chest pain presently -Continue aspirin -11/28/2015 heart catheterization--no significant CAD  Elevated troponin -Secondary to demand ischemia -No chest pain -Personally reviewed EKG--sinus rhythm, no ST-T wave changes -Not consistent with ACS  Depression/anxiety -Continue Effexor and BuSpar  Chronic pain -Continue gabapentin      Past Medical History:  Diagnosis Date  . Bronchitis   . DJD (degenerative joint disease)   . Glaucoma   . History of tobacco abuse 10/23/2011   History of smoking for 10 years, a pack per day, quitting at age 57.  . IBS (irritable bowel syndrome)   . Leukopenia 10/23/2011   Decrease in lymphocytes dating back to 1995.  . PUD (peptic ulcer disease) Jan 2017   endoscopy  . Renal calculi   . Sleep apnea    on C-pap   Past Surgical History:  Procedure Laterality Date  . APPENDECTOMY    . CARDIAC CATHETERIZATION N/A 11/27/2015   Procedure: Left Heart Cath and Coronary Angiography;  Surgeon: Belva Crome, MD;  Location: [facility];  Service: Cardiovascular;  Laterality: N/A;  . COLONOSCOPY N/A 09/01/2012   Procedure: COLONOSCOPY;  Surgeon: [name], MD;  [facility]: [facility];  Service: Endoscopy;  Laterality: N/A;  1030  . CYSTOGRAPHY    . ESOPHAGOGASTRODUODENOSCOPY N/A 07/12/2015   Procedure: ESOPHAGOGASTRODUODENOSCOPY (EGD);  Surgeon: [name], MD;  Location: [facility];  Service: Endoscopy;  Laterality: N/A;  2:50 - moved to 1:00 - Ann to notify  . REFRACTIVE SURGERY     "to relieve glaucoma"  . ROTATOR  CUFF REPAIR     bilateral shoulders  . SP FL GUIDE SPINAL INJ  May 2017   Social History:  reports that he quit smoking about 35 years ago. His smoking use included cigarettes. He has a 30.00 pack-year smoking history. He has never used smokeless tobacco. He reports current alcohol use. He reports that he does not use drugs.   Family History  Problem Relation Age of Onset  . Breast cancer Mother   .  Hypertension Mother   . Kidney Stones Brother      Allergies  Allergen Reactions  . Clams [Shellfish Allergy] Nausea And Vomiting  . Demeclocycline Rash  . Tetracyclines & Related Rash  . Tylenol [Acetaminophen] Rash     Prior to Admission medications   Medication Sig Start Date End Date Taking? Authorizing Provider  atorvastatin (LIPITOR) 20 MG tablet Take 1 tablet by mouth daily. 05/24/18  Yes [provider]  busPIRone (BUSPAR) 10 MG tablet Take 1 tablet by mouth 2 (two) times a day. 11/17/18  Yes [provider]  cyclobenzaprine (FLEXERIL) 10 MG tablet Take 10 mg by mouth 3 (three) times daily as needed for muscle spasms.   Yes [provider]  gabapentin (NEURONTIN) 300 MG capsule Take 300-600 mg by mouth 3 (three) times daily. Patient takes 1 capsule in the morning, 1 capsule in the evening, and 2 capsules at night   Yes [provider]  latanoprost (XALATAN) 0.005 % ophthalmic solution Place 1 drop into both eyes at bedtime. 05/26/18  Yes [provider]  Oxycodone HCl 10 MG TABS Take 10 mg by mouth every 6 (six) hours as needed.   Yes [provider]  tiZANidine (ZANAFLEX) 4 MG tablet Take 4 mg by mouth every 8 (eight) hours as needed for muscle spasms.   Yes [provider]  venlafaxine XR (EFFEXOR-XR) 75 MG 24 hr capsule TAKE 1 CAPSULE ONCE A DAY WITH BREAKFAST. 04/27/18  Yes [name], MD  aspirin EC 81 MG tablet Take 81 mg by mouth daily.    [provider]  Cholecalciferol (VITAMIN D3) 125 MCG (5000 UT) TABS Take 1 capsule by mouth daily.    [provider]  fluticasone (FLONASE) 50 MCG/ACT nasal spray INHALE 2 SPRAYS IN EACH NOSTRIL ONCE DAILY. 07/12/18   [name], MD  ibuprofen (ADVIL,MOTRIN) 200 MG tablet Take 400 mg by mouth every 6 (six) hours as needed (pain).    [provider]  Misc Natural Products (COLON CLEANSE PO) Take 1 capsule by mouth daily as needed (constipation).     [provider]  Multiple Vitamin (MULITIVITAMIN WITH MINERALS) TABS Take 1 tablet by mouth daily.    [provider]  mupirocin ointment (BACTROBAN) 2 % Place 1 application into the nose at bedtime. For dryness from CPAP    [provider]  pantoprazole (PROTONIX) 40 MG tablet TAKE (1) TABLET BY MOUTH TWICE DAILY BEFORE MEALS (BREAKFAST AND SUPPER). 07/14/17   Setzer, Rona Ravens, NP  Polyethyl Glycol-Propyl Glycol (SYSTANE OP) Place 1 drop into both eyes daily as needed (seasonal allergies/ dry eyes).    [provider]  Probiotic Product (PROBIOTIC PO) Take 1 tablet by mouth daily.    [provider]  tamsulosin (FLOMAX) 0.4 MG CAPS capsule Take 0.4 mg by mouth daily after breakfast.  07/13/13   [provider]  zolpidem (AMBIEN) 10 MG tablet Take 1 tablet (10 mg total) by mouth at bedtime as needed for up to 30 days  for sleep. 10/28/18 11/27/18  [name], MD    Review of Systems:  Constitutional:  No weight loss, night sweats, Head&Eyes: No headache.  No vision loss.  No eye pain or scotoma ENT:  No Difficulty swallowing,Tooth/dental problems,Sore throat,  No ear ache, post nasal drip,  Cardio-vascular:  No chest pain, Orthopnea, PND, swelling in lower extremities,  dizziness, palpitations  GI:  No   hematochezia, melena, heartburn, indigestion, Resp:   No coughing up of blood .No wheezing.No chest wall deformity  Skin:  no rash or lesions.  GU:  no dysuria, change in color of urine, no urgency or frequency. No flank pain.  Musculoskeletal:  No joint pain or swelling. No decreased range of motion. No back pain.  Psych:  No change in mood or affect. No depression or anxiety. Neurologic: No headache, no dysesthesia, no focal weakness, no vision loss. No syncope  Physical Exam: Vitals:   12/18/18 1415 12/18/18 1430 12/18/18 1445 12/18/18 1500  BP:  136/60  119/61  Pulse: (!) 110 (!) 103 (!) 110 (!) 103  Resp: (!) 22 (!) 30  (!) 28 (!) 21  Temp:      TempSrc:      SpO2: 91% 96% (!) 89% 93%  Weight:      Height:       General:  A&O x 3, NAD, nontoxic, pleasant/cooperative Head/Eye: No conjunctival hemorrhage, no icterus, [place]/AT, No nystagmus ENT:  No icterus,  No thrush, good dentition, no pharyngeal exudate Neck:  No masses, no lymphadenpathy, no bruits CV:  RRR, no rub, no gallop, no S3 Lung:  Bilateral scattered rales, no wheezing Abdomen: soft/NT, +BS, nondistended, no peritoneal signs Ext: No cyanosis, No rashes, No petechiae, No lymphangitis, No edema Neuro: CNII-XII intact, strength 4/5 in bilateral upper and lower extremities, no dysmetria  Labs on Admission:  Basic Metabolic Panel: Recent Labs  Lab 12/18/18 1333  NA 132*  K 2.8*  CL 90*  CO2 31  GLUCOSE 151*  BUN 19  CREATININE 0.91  CALCIUM 8.6*   Liver Function Tests: Recent Labs  Lab 12/18/18 1333  AST 110*  ALT 119*  ALKPHOS 108  BILITOT 1.2  PROT 6.7  ALBUMIN 2.7*   No results for input(s): LIPASE, AMYLASE in the last 168 hours. No results for input(s): AMMONIA in the last 168 hours. CBC: Recent Labs  Lab 12/18/18 1333  WBC 6.9  NEUTROABS 6.4  HGB 14.0  HCT 38.5*  MCV 91.9  PLT 304   Coagulation Profile: No results for input(s): INR, PROTIME in the last 168 hours. Cardiac Enzymes: Recent Labs  Lab 12/18/18 1333  TROPONINI 0.03*   BNP: Invalid input(s): POCBNP CBG: No results for input(s): GLUCAP in the last 168 hours. Urine analysis:    Component Value Date/Time   COLORURINE YELLOW 11/25/2015 0136   APPEARANCEUR CLEAR 11/25/2015 0136   LABSPEC <1.005 (L) 11/25/2015 0136   PHURINE 6.0 11/25/2015 0136   GLUCOSEU NEGATIVE 11/25/2015 0136   HGBUR NEGATIVE 11/25/2015 0136   BILIRUBINUR NEGATIVE 11/25/2015 0136   KETONESUR TRACE (A) 11/25/2015 0136   PROTEINUR NEGATIVE 11/25/2015 0136   UROBILINOGEN 0.2 02/06/2015 1353   NITRITE NEGATIVE 11/25/2015 0136   LEUKOCYTESUR NEGATIVE 11/25/2015 0136    Sepsis Labs: '@LABRCNTIP' (procalcitonin:4,lacticidven:4) ) Recent Results (from the past 240 hour(s))  SARS Coronavirus 2 (CEPHEID - Performed in [facility] hospital lab), Hosp Order     Status: Abnormal   Collection Time: 12/18/18  1:13 PM   Specimen: Nasopharyngeal Swab  Result  Value Ref Range Status   SARS Coronavirus 2 POSITIVE (A) NEGATIVE Final    Comment: CRITICAL RESULT CALLED TO, READ BACK BY AND VERIFIED WITH: A. TUTTLE '@1426'   12/18/18 KAY (NOTE) If result is NEGATIVE SARS-CoV-2 target nucleic acids are NOT DETECTED. The SARS-CoV-2 RNA is generally detectable in upper and lower  respiratory specimens during the acute phase of infection. The lowest  concentration of SARS-CoV-2 viral copies this assay can detect is 250  copies / mL. A negative result does not preclude SARS-CoV-2 infection  and should not be used as the sole basis for treatment or other  patient management decisions.  A negative result may occur with  improper specimen collection / handling, submission of specimen other  than nasopharyngeal swab, presence of viral mutation(s) within the  areas targeted by this assay, and inadequate number of viral copies  (<250 copies / mL). A negative result must be combined with clinical  observations, patient history, and epidemiological information. If result is POSITIVE SARS-CoV-2 target nucleic acids are DETECTED.  The SARS-CoV-2 RNA is generally detectable in upper and lower  respiratory specimens during the acute phase of infection.  Positive  results are indicative of active infection with SARS-CoV-2.  Clinical  correlation with patient history and other diagnostic information is  necessary to determine patient infection status.  Positive results do  not rule out bacterial infection or co-infection with other viruses. If result is PRESUMPTIVE POSTIVE SARS-CoV-2 nucleic acids MAY BE PRESENT.   A presumptive positive result was obtained on the submitted specimen   and confirmed on repeat testing.  While 2019 novel coronavirus  (SARS-CoV-2) nucleic acids may be present in the submitted sample  additional confirmatory testing may be necessary for epidemiological  and / or clinical management purposes  to differentiate between  SARS-CoV-2 and other Sarbecovirus currently known to infect humans.  If clinically indicated additional testing with an alternate test  methodology [phone])  is advised. The SARS-CoV-2 RNA is generally  detectable in upper and lower respiratory specimens during the acute  phase of infection. The expected result is Negative. Fact Sheet for Patients:  StrictlyIdeas.no Fact Sheet for Healthcare Providers: BankingDealers.co.za This test is not yet approved or cleared by the [place] FDA and has been authorized for detection and/or diagnosis of SARS-CoV-2 by FDA under an Emergency Use Authorization (EUA).  This EUA will remain in effect (meaning this test can be used) for the duration of the COVID-19 declaration under Section 564(b)(1) of the Act, 21 U.S.C. section 360bbb-3(b)(1), unless the authorization is terminated or revoked sooner. Performed at [facility], [address] [place], [place] [address]   Blood Culture (routine x 2)     Status: None (Preliminary result)   Collection Time: 12/18/18  1:44 PM   Specimen: Left Antecubital; Blood  Result Value Ref Range Status   Specimen Description   Final    LEFT ANTECUBITAL BOTTLES DRAWN AEROBIC AND ANAEROBIC   Special Requests   Final    Blood Culture adequate volume Performed at [facility], [address], [place], [place] [address]    Culture PENDING  Incomplete   Report Status PENDING  Incomplete  Blood Culture (routine x 2)     Status: None (Preliminary result)   Collection Time: 12/18/18  1:45 PM   Specimen: BLOOD RIGHT WRIST  Result Value Ref Range Status   Specimen Description   Final    BLOOD RIGHT WRIST  BOTTLES DRAWN AEROBIC AND ANAEROBIC   Special Requests  Final    Blood Culture adequate volume Performed at [facility], [address] [place], [place] [address]    Culture PENDING  Incomplete   Report Status PENDING  Incomplete     Radiological Exams on Admission: Dg Chest Portable 1 View  Result Date: 12/18/2018 CLINICAL DATA:  68 year old male with shortness of breath EXAM: PORTABLE CHEST 1 VIEW COMPARISON:  11/28/2015 and prior radiographs FINDINGS: Cardiomegaly again noted. Bilateral interstitial and scattered airspace opacities, RIGHT greater than LEFT, are identified. No definite pleural effusion or pneumothorax. No acute bony abnormalities are noted. IMPRESSION: Cardiomegaly with bilateral interstitial and scattered airspace opacities, RIGHT greater than LEFT - question pneumonia versus pulmonary edema. Electronically Signed   By: Margarette Canada M.D.   On: 12/18/2018 14:33    EKG: Independently reviewed. Sinus, no STT changes  PPE: bouffant cap, gown, N95, goggles and face shield  Time spent:70 minutes Code Status:   FULL Family Communication:  No Family at bedside Disposition Plan: anticipate 5 day hospitalization Consults called: none DVT Prophylaxis: [place] Lovenox  Orson Eva, DO  [facility] Pager [phone]  If 7PM-7AM, please contact night-coverage [email or web address] Password [email or web address] 12/18/2018, 3:53 PM

## 2018-12-18 NOTE — ED Notes (Signed)
[facility] ascertain if meds can be consolidated

## 2018-12-18 NOTE — ED Notes (Signed)
RT paged for evaluation

## 2018-12-18 NOTE — ED Notes (Signed)
Pt continues to await transfer/transport

## 2018-12-18 NOTE — ED Notes (Signed)
CRITICAL VALUE ALERT  Critical Value:  Lactic Acid 2.3  Date & Time Notied:  12/18/2018 @ 2641  Provider Notified: Dr Tat  Orders Received/Actions taken: see new orders.

## 2018-12-18 NOTE — ED Notes (Signed)
Critical result   Pt is Covid positive

## 2018-12-18 NOTE — ED Triage Notes (Signed)
Pt here for multiple complaints. Has been having trouble breathing for several weeks. States he cannot eat, his mouth is dry, and he feels dehydrated. Pt hyperventilating on arrival to ED. Reports it hurts to breathe in and out.

## 2018-12-19 ENCOUNTER — Inpatient Hospital Stay (HOSPITAL_COMMUNITY): Payer: Medicare Other

## 2018-12-19 ENCOUNTER — Inpatient Hospital Stay: Payer: Self-pay

## 2018-12-19 DIAGNOSIS — J189 Pneumonia, unspecified organism: Secondary | ICD-10-CM

## 2018-12-19 DIAGNOSIS — U071 COVID-19: Secondary | ICD-10-CM

## 2018-12-19 DIAGNOSIS — E876 Hypokalemia: Secondary | ICD-10-CM | POA: Diagnosis present

## 2018-12-19 DIAGNOSIS — J069 Acute upper respiratory infection, unspecified: Secondary | ICD-10-CM

## 2018-12-19 DIAGNOSIS — A419 Sepsis, unspecified organism: Secondary | ICD-10-CM

## 2018-12-19 LAB — COMPREHENSIVE METABOLIC PANEL
ALT: 86 U/L — ABNORMAL HIGH (ref 0–44)
AST: 63 U/L — ABNORMAL HIGH (ref 15–41)
Albumin: 2.4 g/dL — ABNORMAL LOW (ref 3.5–5.0)
Alkaline Phosphatase: 89 U/L (ref 38–126)
Anion gap: 13 (ref 5–15)
BUN: 16 mg/dL (ref 8–23)
CO2: 29 mmol/L (ref 22–32)
Calcium: 8.2 mg/dL — ABNORMAL LOW (ref 8.9–10.3)
Chloride: 97 mmol/L — ABNORMAL LOW (ref 98–111)
Creatinine, Ser: 0.7 mg/dL (ref 0.61–1.24)
GFR calc Af Amer: >60 mL/min (ref >60)
GFR calc non Af Amer: >60 mL/min (ref >60)
Glucose, Bld: 197 mg/dL — ABNORMAL HIGH (ref 70–99)
Potassium: 2.7 mmol/L — CL (ref 3.5–5.1)
Sodium: 139 mmol/L (ref 135–145)
Total Bilirubin: 0.5 mg/dL (ref 0.3–1.2)
Total Protein: 6 g/dL — ABNORMAL LOW (ref 6.5–8.1)

## 2018-12-19 LAB — POCT I-STAT 7, (LYTES, BLD GAS, ICA,H+H)
Acid-Base Excess: 6 mmol/L — ABNORMAL HIGH (ref 0.0–2.0)
Bicarbonate: 30.3 mmol/L — ABNORMAL HIGH (ref 20.0–28.0)
Calcium, Ion: 1.17 mmol/L (ref 1.15–1.40)
HCT: 33 % — ABNORMAL LOW (ref 39.0–52.0)
Hemoglobin: 11.2 g/dL — ABNORMAL LOW (ref 13.0–17.0)
O2 Saturation: 98 %
Patient temperature: 98.6
Potassium: 2.5 mmol/L — CL (ref 3.5–5.1)
Sodium: 139 mmol/L (ref 135–145)
TCO2: 32 mmol/L (ref 22–32)
pCO2 arterial: 42.1 mmHg (ref 32.0–48.0)
pH, Arterial: 7.466 — ABNORMAL HIGH (ref 7.350–7.450)
pO2, Arterial: 96 mmHg (ref 83.0–108.0)

## 2018-12-19 LAB — BLOOD GAS, ARTERIAL
Acid-Base Excess: 7.2 mmol/L — ABNORMAL HIGH (ref 0.0–2.0)
Bicarbonate: 29.3 mmol/L — ABNORMAL HIGH (ref 20.0–28.0)
FIO2: 100
O2 Saturation: 96.7 %
Patient temperature: 37
pCO2 arterial: 57.1 mmHg — ABNORMAL HIGH (ref 32.0–48.0)
pH, Arterial: 7.373 (ref 7.350–7.450)
pO2, Arterial: 107 mmHg (ref 83.0–108.0)

## 2018-12-19 LAB — GLUCOSE, CAPILLARY
Glucose-Capillary: 199 mg/dL — ABNORMAL HIGH (ref 70–99)
Glucose-Capillary: 175 mg/dL — ABNORMAL HIGH (ref 70–99)
Glucose-Capillary: 193 mg/dL — ABNORMAL HIGH (ref 70–99)
Glucose-Capillary: 175 mg/dL — ABNORMAL HIGH (ref 70–99)

## 2018-12-19 LAB — MAGNESIUM: Magnesium: 2.5 mg/dL — ABNORMAL HIGH (ref 1.7–2.4)

## 2018-12-19 LAB — PROCALCITONIN: Procalcitonin: 1.15 ng/mL

## 2018-12-19 LAB — MRSA PCR SCREENING: MRSA by PCR: NEGATIVE

## 2018-12-19 MED ORDER — GABAPENTIN 250 MG/5ML PO SOLN
600.0000 mg | Freq: Every day | ORAL | Status: DC
  Administered 2018-12-19: 600 mg
  Filled 2018-12-19 (×2): qty 12

## 2018-12-19 MED ORDER — SUCCINYLCHOLINE CHLORIDE 200 MG/10ML IV SOSY
PREFILLED_SYRINGE | INTRAVENOUS | Status: AC
  Filled 2018-12-19: qty 10

## 2018-12-19 MED ORDER — VITAL HIGH PROTEIN PO LIQD
1000.0000 mL | ORAL | Status: DC
  Administered 2018-12-19: 10:00:00 1000 mL

## 2018-12-19 MED ORDER — MIDAZOLAM HCL 2 MG/2ML IJ SOLN
1.0000 mg | INTRAMUSCULAR | Status: DC | PRN
  Administered 2018-12-19: 11:00:00 1 mg via INTRAVENOUS

## 2018-12-19 MED ORDER — ATORVASTATIN CALCIUM 10 MG PO TABS
20.0000 mg | ORAL_TABLET | Freq: Every day | ORAL | Status: DC
  Administered 2018-12-20: 20 mg
  Filled 2018-12-19: qty 2

## 2018-12-19 MED ORDER — SODIUM CHLORIDE 0.9 % IV SOLN
1.0000 g | INTRAVENOUS | Status: DC
  Administered 2018-12-19 – 2018-12-21 (×3): 1 g via INTRAVENOUS
  Filled 2018-12-19: qty 10
  Filled 2018-12-19: qty 1
  Filled 2018-12-19: qty 10

## 2018-12-19 MED ORDER — CHLORHEXIDINE GLUCONATE CLOTH 2 % EX PADS
6.0000 | MEDICATED_PAD | Freq: Every day | CUTANEOUS | Status: DC
  Administered 2018-12-20 – 2019-01-06 (×15): 6 via TOPICAL

## 2018-12-19 MED ORDER — GABAPENTIN 250 MG/5ML PO SOLN
300.0000 mg | Freq: Two times a day (BID) | ORAL | Status: DC
  Administered 2018-12-19 – 2018-12-20 (×2): 300 mg
  Filled 2018-12-19 (×5): qty 6

## 2018-12-19 MED ORDER — GABAPENTIN 300 MG PO CAPS
300.0000 mg | ORAL_CAPSULE | Freq: Two times a day (BID) | ORAL | Status: DC
  Administered 2018-12-19: 300 mg via ORAL
  Filled 2018-12-19 (×3): qty 1

## 2018-12-19 MED ORDER — FENTANYL BOLUS VIA INFUSION
25.0000 ug | INTRAVENOUS | Status: DC | PRN
  Filled 2018-12-19: qty 25

## 2018-12-19 MED ORDER — MIDAZOLAM HCL 2 MG/2ML IJ SOLN
2.0000 mg | INTRAMUSCULAR | Status: DC | PRN
  Administered 2018-12-19 (×6): 2 mg via INTRAVENOUS
  Filled 2018-12-19 (×7): qty 2

## 2018-12-19 MED ORDER — PROPOFOL 1000 MG/100ML IV EMUL
5.0000 ug/kg/min | INTRAVENOUS | Status: DC
  Administered 2018-12-19 (×2): 50 ug/kg/min via INTRAVENOUS
  Administered 2018-12-19: 07:00:00 35 ug/kg/min via INTRAVENOUS
  Filled 2018-12-19: qty 100

## 2018-12-19 MED ORDER — VECURONIUM BROMIDE 10 MG IV SOLR
INTRAVENOUS | Status: AC
  Filled 2018-12-19: qty 10

## 2018-12-19 MED ORDER — CHLORHEXIDINE GLUCONATE 0.12% ORAL RINSE (MEDLINE KIT)
15.0000 mL | Freq: Two times a day (BID) | OROMUCOSAL | Status: DC
  Administered 2018-12-19 – 2018-12-20 (×4): 15 mL via OROMUCOSAL

## 2018-12-19 MED ORDER — MIDAZOLAM HCL 2 MG/2ML IJ SOLN
2.0000 mg | Freq: Once | INTRAMUSCULAR | Status: AC
  Administered 2018-12-19: 2 mg via INTRAVENOUS

## 2018-12-19 MED ORDER — ETOMIDATE 2 MG/ML IV SOLN
INTRAVENOUS | Status: AC
  Filled 2018-12-19: qty 20

## 2018-12-19 MED ORDER — ORAL CARE MOUTH RINSE
15.0000 mL | OROMUCOSAL | Status: DC
  Administered 2018-12-19 – 2018-12-20 (×14): 15 mL via OROMUCOSAL

## 2018-12-19 MED ORDER — GABAPENTIN 300 MG PO CAPS
600.0000 mg | ORAL_CAPSULE | Freq: Every day | ORAL | Status: DC
  Filled 2018-12-19: qty 2

## 2018-12-19 MED ORDER — FENTANYL CITRATE (PF) 100 MCG/2ML IJ SOLN
INTRAMUSCULAR | Status: AC
  Filled 2018-12-19: qty 2

## 2018-12-19 MED ORDER — DEXMEDETOMIDINE HCL IN NACL 400 MCG/100ML IV SOLN
0.4000 ug/kg/h | INTRAVENOUS | Status: DC
  Administered 2018-12-19 – 2018-12-20 (×2): 0.4 ug/kg/h via INTRAVENOUS
  Filled 2018-12-19 (×3): qty 100

## 2018-12-19 MED ORDER — MIDAZOLAM HCL 2 MG/2ML IJ SOLN
INTRAMUSCULAR | Status: AC
  Administered 2018-12-19: 2 mg
  Filled 2018-12-19: qty 4

## 2018-12-19 MED ORDER — SODIUM CHLORIDE 0.9% FLUSH
10.0000 mL | Freq: Two times a day (BID) | INTRAVENOUS | Status: DC
  Administered 2018-12-19 – 2018-12-21 (×5): 10 mL

## 2018-12-19 MED ORDER — ENOXAPARIN SODIUM 40 MG/0.4ML ~~LOC~~ SOLN
40.0000 mg | Freq: Two times a day (BID) | SUBCUTANEOUS | Status: DC
  Administered 2018-12-19 – 2019-01-11 (×46): 40 mg via SUBCUTANEOUS
  Filled 2018-12-19 (×47): qty 0.4

## 2018-12-19 MED ORDER — ASPIRIN 81 MG PO CHEW
81.0000 mg | CHEWABLE_TABLET | Freq: Every day | ORAL | Status: DC
  Administered 2018-12-20 – 2019-01-13 (×25): 81 mg via ORAL
  Filled 2018-12-19 (×25): qty 1

## 2018-12-19 MED ORDER — SODIUM CHLORIDE 0.9 % IV SOLN
100.0000 mg | INTRAVENOUS | Status: AC
  Administered 2018-12-20 – 2018-12-23 (×4): 100 mg via INTRAVENOUS
  Filled 2018-12-19 (×4): qty 20

## 2018-12-19 MED ORDER — FENTANYL CITRATE (PF) 100 MCG/2ML IJ SOLN
25.0000 ug | INTRAMUSCULAR | Status: DC | PRN
  Administered 2018-12-19 (×2): 100 ug via INTRAVENOUS
  Filled 2018-12-19 (×2): qty 2

## 2018-12-19 MED ORDER — FENTANYL CITRATE (PF) 100 MCG/2ML IJ SOLN
25.0000 ug | Freq: Once | INTRAMUSCULAR | Status: AC
  Administered 2018-12-19: 25 ug via INTRAVENOUS

## 2018-12-19 MED ORDER — PROPOFOL 1000 MG/100ML IV EMUL
INTRAVENOUS | Status: AC
  Administered 2018-12-19: 02:00:00 50 ug/kg/min via INTRAVENOUS
  Filled 2018-12-19: qty 100

## 2018-12-19 MED ORDER — SODIUM CHLORIDE 0.9 % IV SOLN
200.0000 mg | Freq: Once | INTRAVENOUS | Status: AC
  Administered 2018-12-19: 200 mg via INTRAVENOUS
  Filled 2018-12-19: qty 40

## 2018-12-19 MED ORDER — SODIUM CHLORIDE 0.9% FLUSH: 10.0000 mL | INTRAVENOUS | Status: DC | PRN

## 2018-12-19 MED ORDER — MIDAZOLAM HCL 2 MG/2ML IJ SOLN
INTRAMUSCULAR | Status: AC
  Administered 2018-12-19: 2 mg via INTRAVENOUS
  Filled 2018-12-19: qty 2

## 2018-12-19 MED ORDER — FENTANYL CITRATE (PF) 100 MCG/2ML IJ SOLN: 25.0000 ug | INTRAMUSCULAR | Status: DC | PRN

## 2018-12-19 MED ORDER — SODIUM CHLORIDE 0.9 % IV SOLN
500.0000 mg | INTRAVENOUS | Status: DC
  Administered 2018-12-19 – 2018-12-21 (×3): 500 mg via INTRAVENOUS
  Filled 2018-12-19 (×3): qty 500

## 2018-12-19 MED ORDER — BUSPIRONE HCL 10 MG PO TABS
10.0000 mg | ORAL_TABLET | Freq: Two times a day (BID) | ORAL | Status: DC
  Administered 2018-12-19 – 2018-12-20 (×2): 10 mg
  Filled 2018-12-19 (×2): qty 1

## 2018-12-19 MED ORDER — ROCURONIUM BROMIDE 10 MG/ML (PF) SYRINGE
PREFILLED_SYRINGE | INTRAVENOUS | Status: AC
  Filled 2018-12-19: qty 10

## 2018-12-19 MED ORDER — PANTOPRAZOLE SODIUM 40 MG PO PACK
40.0000 mg | PACK | Freq: Two times a day (BID) | ORAL | Status: DC
  Administered 2018-12-19 – 2018-12-20 (×3): 40 mg
  Filled 2018-12-19 (×3): qty 20

## 2018-12-19 MED ORDER — POTASSIUM CHLORIDE 10 MEQ/100ML IV SOLN
10.0000 meq | INTRAVENOUS | Status: AC
  Administered 2018-12-19 (×6): 10 meq via INTRAVENOUS
  Filled 2018-12-19 (×6): qty 100

## 2018-12-19 MED ORDER — ADULT MULTIVITAMIN LIQUID CH
15.0000 mL | Freq: Every day | ORAL | Status: DC
  Administered 2018-12-20: 15 mL
  Filled 2018-12-19: qty 15

## 2018-12-19 MED ORDER — MIDAZOLAM HCL 2 MG/2ML IJ SOLN
2.0000 mg | INTRAMUSCULAR | Status: DC | PRN
  Administered 2018-12-19 – 2018-12-20 (×3): 2 mg via INTRAVENOUS
  Filled 2018-12-19 (×4): qty 2

## 2018-12-19 MED ORDER — FENTANYL 2500MCG IN NS 250ML (10MCG/ML) PREMIX INFUSION
25.0000 ug/h | INTRAVENOUS | Status: DC
  Administered 2018-12-19: 150 ug/h via INTRAVENOUS
  Administered 2018-12-19: 200 ug/h via INTRAVENOUS
  Filled 2018-12-19 (×2): qty 250

## 2018-12-19 MED ORDER — PRO-STAT SUGAR FREE PO LIQD
30.0000 mL | Freq: Two times a day (BID) | ORAL | Status: DC
  Administered 2018-12-19 – 2018-12-20 (×3): 30 mL
  Filled 2018-12-19 (×3): qty 30

## 2018-12-19 NOTE — ED Notes (Signed)
[facility] here to transport pt to [facility].

## 2018-12-19 NOTE — Progress Notes (Signed)
Carly, RN contacted multiple times concerning PICC placement. This Animal nutritionist, RN have made multiple attempts to contact family to get verbal consent for PICC placement. Will continue to contact family, so PICC can be placed today.

## 2018-12-19 NOTE — Progress Notes (Signed)
[facility] Progress Note Patient Name: ODDIS WESTLING DOB: 1950/12/12 MRN: [id number]   Date of Service  12/19/2018  HPI/Events of Note  K+ = 2.5 and Creatinine = 0.91.   eICU Interventions  Will replace K+.      Intervention Category Major Interventions: Electrolyte abnormality - evaluation and management  Felesia Stahlecker Eugene 12/19/2018, 6:34 AM

## 2018-12-19 NOTE — Progress Notes (Addendum)
Patient's brother, Talvin Christianson called and was given an update on patient's condition. Audry Pili states that the patient and his daughter, who is legal next of kin do not talk nor have a relationship.Ricky's phone number was added to patient's contact list.

## 2018-12-19 NOTE — ED Notes (Signed)
Pt was intubated by Sabra Heck EDP after 100mg  rocuronium and 30mg  of etomidate given by this RN at 2344. RT at bedside. Positive color change. EDP also placed OG tube. Pt stated on Propofol at 50mg /kg/min per verbal order of Miller EDP. VSS currently stable at this time.

## 2018-12-19 NOTE — Progress Notes (Signed)
Pt is alert and following commands. Wrote on piece of paper to communicate. He's very appropriate and asking to watch TV or iPad.

## 2018-12-19 NOTE — Progress Notes (Signed)
Verbal order by Dr. Roderic Palau, Faythe Ghee to use OG tube with current CXR film results.

## 2018-12-19 NOTE — Progress Notes (Addendum)
Patient AOx4, following commands and more appropriate now than earlier this morning. Patient wrote on paper he wants xanax for anxiety. Will give Versed PRN and continue to monitor.

## 2018-12-19 NOTE — Progress Notes (Signed)
Brief Nutrition Note RD working remotely.   Consult received for enteral/tube feeding initiation and management.  Adult Enteral Nutrition Protocol initiated. Full assessment to follow.  Admitting Dx: SOB (shortness of breath) [R06.02] Encounter for intubation [Z01.818] Acute respiratory failure with hypoxia ([place]) [J96.01] Pneumonia due to COVID-19 virus [U07.1, J12.89] Respiratory failure with hypoxia ([place]) [J96.91]  Body mass index is 26.16 kg/m. Pt meets criteria for overweight based on current BMI.  Labs:  Recent Labs  Lab 12/18/18 1333 12/19/18 0555  NA 132* 139  K 2.8* 2.5*  CL 90*  --   CO2 31  --   BUN 19  --   CREATININE 0.91  --   CALCIUM 8.6*  --   GLUCOSE 151*  --       Jarome Matin, MS, RD, LDN, [facility] Inpatient Clinical Dietitian Pager # [phone] After hours/weekend pager # [phone]

## 2018-12-19 NOTE — Progress Notes (Addendum)
Peripherally Inserted Central Catheter/Midline Placement  The IV Nurse has discussed with the patient and/or persons authorized to consent for the patient, the purpose of this procedure and the potential benefits and risks involved with this procedure.  The benefits include less needle sticks, lab draws from the catheter, and the patient may be discharged home with the catheter. Risks include, but not limited to, infection, bleeding, blood clot (thrombus formation), and puncture of an artery; nerve damage and irregular heartbeat and possibility to perform a PICC exchange if needed/ordered by physician.  Alternatives to this procedure were also discussed.  Bard Power PICC patient education guide, fact sheet on infection prevention and patient information card has been provided to patient /or left at bedside. PICC was placed under medical necessity. Multiple efforts were attempted to contact family without effect.    PICC/Midline Placement Documentation  PICC Double Lumen 84/69/62 PICC Right Basilic 37 cm 0 cm (Active)  Indication for Insertion or Continuance of Line Vasoactive infusions;Prolonged intravenous therapies 12/19/18 1700  Exposed Catheter (cm) 0 cm 12/19/18 1700  Site Assessment Clean;Dry;Intact 12/19/18 1700  Lumen #1 Status Flushed;Blood return noted;Saline locked 12/19/18 1700  Lumen #2 Status Flushed;Blood return noted;Saline locked 12/19/18 1700  Dressing Type Transparent 12/19/18 1700  Dressing Status Clean;Dry;Intact;Antimicrobial disc in place 12/19/18 [place] checked and tightened 12/19/18 1700  Dressing Intervention New dressing 12/19/18 1700  Dressing Change Due 12/26/18 12/19/18 1700       Lorenza Cambridge 12/19/2018, 5:40 PM

## 2018-12-19 NOTE — Progress Notes (Signed)
Update given to Joel Greene, patient's girlfriend. Rose claims she is the patients medical POA but is unable to provide supporting documents.

## 2018-12-19 NOTE — Progress Notes (Signed)
PROGRESS NOTE    Joel Greene  OTL:[id number] DOB: 1951-06-25 DOA: 12/18/2018 PCP: Redmond School, MD    Brief Narrative:  68 year old male was initially evaluated at a [facility] hospital and transferred to [place] when he tested positive for COVID-19.  He had presented with fevers, chills, shortness of breath.  He became progressively hypoxic and tachypneic.  He was intubated in the emergency room prior to transfer to [facility].  Currently on steroids, remdesivir and antibiotics.   Assessment & Plan:   Active Problems:   Acute respiratory failure with hypoxia (HCC)   Sepsis due to undetermined organism ([place])   Acute respiratory disease due to COVID-19 virus   Respiratory failure with hypoxia ([place])   1. Acute respiratory failure with hypoxia.  Secondary to COVID-19 pneumonia.  Currently intubated and sedated on ventilator.  PCCM following for vent management.  Continue current treatments.  Was previously on propofol for sedation, but this was changed to fentanyl and Versed due to bradycardia. 2. Sepsis secondary to COVID-19.  Blood cultures been.  Procalcitonin has been ordered.  Lactic acid was initially elevated, but is trending down.  Started on antibiotic coverage for community-acquired pneumonia.  Follow-up culture results.  Low threshold to discontinue antibiotics if procalcitonin is low. 3. Hypokalemia.  Replace.  Magnesium is normal.   DVT prophylaxis: Lovenox Code Status: Full code Family Communication: left VM for daughter on 6/21 Disposition Plan: Continue management in ICU   Consultants:   PCCM  Procedures:   Intubation 6/20 >  Antimicrobials:   Azithromycin 6/21 >  Ceftriaxone 6/21 >  Remdesivir 6/20 >   Subjective: Remains intubated, sedated on vent.  Having periods of agitation.  Objective: Vitals:   12/19/18 0716 12/19/18 0800 12/19/18 0900 12/19/18 0939  BP: 123/80 134/79 122/81   Pulse: (!) 51 (!) 52 75   Resp: 18 18     Temp:  (!) 97.5 F (36.4 C)    TempSrc:  Rectal    SpO2: 96% 97%  92%  Weight:      Height:        Intake/Output Summary (Last 24 hours) at 12/19/2018 1019 Last data filed at 12/19/2018 0900 Gross per 24 hour  Intake 3085.11 ml  Output 1450 ml  Net 1635.11 ml   Filed Weights   12/18/18 1307 12/19/18 0510  Weight: 79.4 kg 75.8 kg    Examination:  General exam: intubated and sedated Respiratory system: Clear to auscultation. Respiratory effort normal. Cardiovascular system: S1 & S2 heard, RRR. No JVD, murmurs, rubs, gallops or clicks. No pedal edema. Gastrointestinal system: Abdomen is nondistended, soft and nontender. No organomegaly or masses felt. Normal bowel sounds heard. Central nervous system: Unable to assess Extremities: Symmetric 5 x 5 power. Skin: No rashes, lesions or ulcers Psychiatry: unable to assess     Data Reviewed: I have personally reviewed following labs and imaging studies  CBC: Recent Labs  Lab 12/18/18 1333 12/19/18 0555  WBC 6.9  --   NEUTROABS 6.4  --   HGB 14.0 11.2*  HCT 38.5* 33.0*  MCV 91.9  --   PLT 304  --    Basic Metabolic Panel: Recent Labs  Lab 12/18/18 1333 12/19/18 0555  NA 132* 139  K 2.8* 2.5*  CL 90*  --   CO2 31  --   GLUCOSE 151*  --   BUN 19  --   CREATININE 0.91  --   CALCIUM 8.6*  --    GFR: Estimated  Creatinine Clearance: 71.6 mL/min (by C-G formula based on SCr of 0.91 mg/dL). Liver Function Tests: Recent Labs  Lab 12/18/18 1333  AST 110*  ALT 119*  ALKPHOS 108  BILITOT 1.2  PROT 6.7  ALBUMIN 2.7*   Recent Labs  Lab 12/18/18 1333  LIPASE 51   No results for input(s): AMMONIA in the last 168 hours. Coagulation Profile: No results for input(s): INR, PROTIME in the last 168 hours. Cardiac Enzymes: Recent Labs  Lab 12/18/18 1333  CKTOTAL 35*  TROPONINI 0.03*   BNP (last 3 results) No results for input(s): PROBNP in the last 8760 hours. HbA1C: No results for input(s): HGBA1C in the last  72 hours. CBG: Recent Labs  Lab 12/19/18 0531  GLUCAP 199*   Lipid Profile: No results for input(s): CHOL, HDL, LDLCALC, TRIG, CHOLHDL, LDLDIRECT in the last 72 hours. Thyroid Function Tests: No results for input(s): TSH, T4TOTAL, FREET4, T3FREE, THYROIDAB in the last 72 hours. Anemia Panel: Recent Labs    12/18/18 1333  FERRITIN 2,370*   Sepsis Labs: Recent Labs  Lab 12/18/18 1333 12/18/18 1600  PROCALCITON 1.24  --   LATICACIDVEN 3.5* 2.3*    Recent Results (from the past 240 hour(s))  SARS Coronavirus 2 (CEPHEID - Performed in [place] hospital lab), Hosp Order     Status: Abnormal   Collection Time: 12/18/18  1:13 PM   Specimen: Nasopharyngeal Swab  Result Value Ref Range Status   SARS Coronavirus 2 POSITIVE (A) NEGATIVE Final    Comment: CRITICAL RESULT CALLED TO, READ BACK BY AND VERIFIED WITH: A. TUTTLE '@[id number]'   12/18/18 KAY (NOTE) If result is NEGATIVE SARS-CoV-2 target nucleic acids are NOT DETECTED. The SARS-CoV-2 RNA is generally detectable in upper and lower  respiratory specimens during the acute phase of infection. The lowest  concentration of SARS-CoV-2 viral copies this assay can detect is 250  copies / mL. A negative result does not preclude SARS-CoV-2 infection  and should not be used as the sole basis for treatment or other  patient management decisions.  A negative result may occur with  improper specimen collection / handling, submission of specimen other  than nasopharyngeal swab, presence of viral mutation(s) within the  areas targeted by this assay, and inadequate number of viral copies  (<250 copies / mL). A negative result must be combined with clinical  observations, patient history, and epidemiological information. If result is POSITIVE SARS-CoV-2 target nucleic acids are DETECTED.  The SARS-CoV-2 RNA is generally detectable in upper and lower  respiratory specimens during the acute phase of infection.  Positive  results are indicative  of active infection with SARS-CoV-2.  Clinical  correlation with patient history and other diagnostic information is  necessary to determine patient infection status.  Positive results do  not rule out bacterial infection or co-infection with other viruses. If result is PRESUMPTIVE POSTIVE SARS-CoV-2 nucleic acids MAY BE PRESENT.   A presumptive positive result was obtained on the submitted specimen  and confirmed on repeat testing.  While 2019 novel coronavirus  (SARS-CoV-2) nucleic acids may be present in the submitted sample  additional confirmatory testing may be necessary for epidemiological  and / or clinical management purposes  to differentiate between  SARS-CoV-2 and other Sarbecovirus currently known to infect humans.  If clinically indicated additional testing with an alternate test  methodology [phone])  is advised. The SARS-CoV-2 RNA is generally  detectable in upper and lower respiratory specimens during the acute  phase of infection. The expected result  is Negative. Fact Sheet for Patients:  StrictlyIdeas.no Fact Sheet for Healthcare Providers: BankingDealers.co.za This test is not yet approved or cleared by the [place] FDA and has been authorized for detection and/or diagnosis of SARS-CoV-2 by FDA under an Emergency Use Authorization (EUA).  This EUA will remain in effect (meaning this test can be used) for the duration of the COVID-19 declaration under Section 564(b)(1) of the Act, 21 U.S.C. section 360bbb-3(b)(1), unless the authorization is terminated or revoked sooner. Performed at [facility], [address], [place], [place] [address]   Blood Culture (routine x 2)     Status: None (Preliminary result)   Collection Time: 12/18/18  1:44 PM   Specimen: Left Antecubital; Blood  Result Value Ref Range Status   Specimen Description   Final    LEFT ANTECUBITAL BOTTLES DRAWN AEROBIC AND ANAEROBIC   Special Requests  Blood Culture adequate volume  Final   Culture   Final    NO GROWTH < 24 HOURS Performed at [facility], [address] [place], [place] [address]    Report Status PENDING  Incomplete  Blood Culture (routine x 2)     Status: None (Preliminary result)   Collection Time: 12/18/18  1:45 PM   Specimen: BLOOD RIGHT WRIST  Result Value Ref Range Status   Specimen Description   Final    BLOOD RIGHT WRIST BOTTLES DRAWN AEROBIC AND ANAEROBIC   Special Requests Blood Culture adequate volume  Final   Culture   Final    NO GROWTH < 24 HOURS Performed at [facility], [address], [place], [place] [address]    Report Status PENDING  Incomplete  MRSA PCR Screening     Status: None   Collection Time: 12/19/18  5:24 AM   Specimen: Nasal Mucosa; Nasopharyngeal  Result Value Ref Range Status   MRSA by PCR NEGATIVE NEGATIVE Final    Comment:        The GeneXpert MRSA Assay (FDA approved for NASAL specimens only), is one component of a comprehensive MRSA colonization surveillance program. It is not intended to diagnose MRSA infection nor to guide or monitor treatment for MRSA infections. Performed at [facility], [place] [address], [place], [place] [address]          Radiology Studies: Dg Chest Port 1 View  Result Date: 12/19/2018 CLINICAL DATA:  Status post intubation and OG tube placement. EXAM: PORTABLE CHEST 1 VIEW COMPARISON:  Single-view of the chest 12/18/2018 at 1338 hours. FINDINGS: Endotracheal tube is in place with the tip in good position below the clavicular heads. OG tube is also seen. The tube courses into the duodenum below the inferior margin the film. Extensive bilateral airspace disease is greater on the right and has progressed since the prior exam. Heart size is upper normal. No pneumothorax. IMPRESSION: ETT in good position. OG tube courses into the duodenum and below the inferior margin the film. Right greater than left airspace disease appears worse  than on the comparison exam. Electronically Signed   By: Inge Rise M.D.   On: 12/19/2018 00:57   Dg Chest Portable 1 View  Result Date: 12/18/2018 CLINICAL DATA:  68 year old male with shortness of breath EXAM: PORTABLE CHEST 1 VIEW COMPARISON:  11/28/2015 and prior radiographs FINDINGS: Cardiomegaly again noted. Bilateral interstitial and scattered airspace opacities, RIGHT greater than LEFT, are identified. No definite pleural effusion or pneumothorax. No acute bony abnormalities are noted. IMPRESSION: Cardiomegaly with bilateral interstitial and scattered airspace opacities, RIGHT  greater than LEFT - question pneumonia versus pulmonary edema. Electronically Signed   By: Margarette Canada M.D.   On: 12/18/2018 14:33   [place] Ekg Site Rite  Result Date: 12/19/2018 If Site Rite image not attached, placement could not be confirmed due to current cardiac rhythm.       Scheduled Meds: . etomidate      . fentaNYL      . midazolam      . rocuronium bromide      . succinylcholine      . vecuronium      . aspirin EC  81 mg Oral Daily  . atorvastatin  20 mg Oral Daily  . busPIRone  10 mg Oral BID  . chlorhexidine gluconate (MEDLINE KIT)  15 mL Mouth Rinse BID  . cholecalciferol  5,000 Units Oral Daily  . enoxaparin (LOVENOX) injection  40 mg Subcutaneous Q24H  . feeding supplement (PRO-STAT SUGAR FREE 64)  30 mL Per Tube BID  . feeding supplement (VITAL HIGH PROTEIN)  1,000 mL Per Tube Q24H  . gabapentin  300 mg Oral BID   And  . gabapentin  600 mg Oral QHS  . latanoprost  1 drop Both Eyes QHS  . mouth rinse  15 mL Mouth Rinse 10 times per day  . methylPREDNISolone (SOLU-MEDROL) injection  40 mg Intravenous Q12H  . multivitamin with minerals  1 tablet Oral Daily  . pantoprazole  40 mg Oral BID AC  . tamsulosin  0.4 mg Oral QPC breakfast  . venlafaxine XR  75 mg Oral Q breakfast   Continuous Infusions: . azithromycin    . cefTRIAXone (ROCEPHIN)  IV 1 g (12/19/18 0951)  .  dexmedetomidine (PRECEDEX) IV infusion    . potassium chloride 10 mEq (12/19/18 0925)  . [START ON 12/20/2018] remdesivir 100 mg in NS 250 mL       LOS: 1 day    Critical care Time spent: 70mns    JKathie Dike MD [facility]   If 7PM-7AM, please contact night-coverage [email or web address]  12/19/2018, 10:19 AM

## 2018-12-19 NOTE — Consult Note (Signed)
NAME:  Joel Greene, MRN:  [id number], DOB:  May 18, 1951, LOS: 1 ADMISSION DATE:  12/18/2018, CONSULTATION DATE:  12/19/2018 REFERRING MD:  [facility] - Memon, CHIEF COMPLAINT:  Acute respiratory failure with COVID   Brief History   68 year old male with history of bronchitis presenting with COVID related acute respiratory failure requiring intubation in [place] and transferred to [facility] for further management.  Patient had history of cough, fever and chills and presented with acute hypoxemia failing to respond to HFNC and required intubation.  Patient is sedated and intubated, history from records.   History of present illness   68 year old male with history of bronchitis presenting with COVID related acute respiratory failure requiring intubation in [place] and transferred to [facility] for further management.  Patient had history of cough, fever and chills and presented with acute hypoxemia failing to respond to HFNC and required intubation.  Patient is sedated and intubated, history from records.   Past Medical History  Bronchitis and IBS not on immune modulators  Significant Hospital Events   6/20 intubation for COVID associated hypoxemic respiratory failure  Consults:  PCCM  Procedures:  N/A  Significant Diagnostic Tests:  CXR 6/21 that I reviewed myself, R>L airspace disease  Micro Data:  6/20 COVID +++  Antimicrobials:  "Remdesivir" 6/20 Rocephin 6/21>>> Zithromax 6/21>>>  Interim history/subjective:  No events since admission, no new complaints  Objective   Blood pressure 123/80, pulse (!) 51, temperature 97.9 F (36.6 C), temperature source Rectal, resp. rate 18, height 5\' 7"  (1.702 m), weight 75.8 kg, SpO2 96 %.    Vent Mode: PRVC FiO2 (%):  [70 %-100 %] 70 % Set Rate:  [15 bmp] 15 bmp Vt Set:  [450 mL-520 mL] 520 mL PEEP:  [5 cmH20] 5 cmH20 Plateau Pressure:  [15 cmH20-19 cmH20] 16 cmH20   Intake/Output Summary (Last 24 hours) at 12/19/2018 0759 Last data filed at  12/19/2018 4098 Gross per 24 hour  Intake 2590 ml  Output 1450 ml  Net 1140 ml   Filed Weights   12/18/18 1307 12/19/18 0510  Weight: 79.4 kg 75.8 kg    Examination: General: Acutely ill appearing male, NAD HENT: [place]/AT, PERRL, EOM-I and MMM, ETT in place Lungs: Coarse BS diffusely Cardiovascular: RRR, Nl S1/S2 and -M/R/G Abdomen: Soft, NT, ND and +BS Extremities: -edema and -tenderness Neuro: Sedate, withdraws all ext to pain but not command Skin: Intact  Resolved Hospital Problem list   N/A  Assessment & Plan:  68 year old with hypoxemic respiratory failure secondary to COVID-19 now intubated.  VDRF:  - Increase PEEP to 10  - Maintain FiO2 for sat of 88-92%  - Decrease RR to 12  - BD as ordered  - CXR and ABG in AM  Sepsis:  - Check PCT  - Start CAP coverage  - D/C if PCT is stable  - F/U on cultures  COVID:  - Remdesivir  - Hold steroids pending PCT and cultures  CAD:  - ASA  - Tele monitoring  - F/u on echo  Sedation:  - D/C Propofol  - PRN fentanyl  - Precedex  Labs   CBC: Recent Labs  Lab 12/18/18 1333 12/19/18 0555  WBC 6.9  --   NEUTROABS 6.4  --   HGB 14.0 11.2*  HCT 38.5* 33.0*  MCV 91.9  --   PLT 304  --     Basic Metabolic Panel: Recent Labs  Lab 12/18/18 1333 12/19/18 0555  NA 132* 139  K 2.8* 2.5*  CL 90*  --   CO2 31  --   GLUCOSE 151*  --   BUN 19  --   CREATININE 0.91  --   CALCIUM 8.6*  --    GFR: Estimated Creatinine Clearance: 71.6 mL/min (by C-G formula based on SCr of 0.91 mg/dL). Recent Labs  Lab 12/18/18 1333 12/18/18 1600  PROCALCITON 1.24  --   WBC 6.9  --   LATICACIDVEN 3.5* 2.3*    Liver Function Tests: Recent Labs  Lab 12/18/18 1333  AST 110*  ALT 119*  ALKPHOS 108  BILITOT 1.2  PROT 6.7  ALBUMIN 2.7*   Recent Labs  Lab 12/18/18 1333  LIPASE 51   No results for input(s): AMMONIA in the last 168 hours.  ABG    Component Value Date/Time   PHART 7.466 (H) 12/19/2018 0555    PCO2ART 42.1 12/19/2018 0555   PO2ART 96.0 12/19/2018 0555   HCO3 30.3 (H) 12/19/2018 0555   TCO2 32 12/19/2018 0555   O2SAT 98.0 12/19/2018 0555     Coagulation Profile: No results for input(s): INR, PROTIME in the last 168 hours.  Cardiac Enzymes: Recent Labs  Lab 12/18/18 1333  CKTOTAL 35*  TROPONINI 0.03*    HbA1C: No results found for: HGBA1C  CBG: Recent Labs  Lab 12/19/18 0531  GLUCAP 199*    Review of Systems:   Not attainable  Past Medical History  He,  has a past medical history of Bronchitis, DJD (degenerative joint disease), Glaucoma, History of tobacco abuse (10/23/2011), IBS (irritable bowel syndrome), Leukopenia (10/23/2011), PUD (peptic ulcer disease) (Jan 2017), Renal calculi, and Sleep apnea.   Surgical History    Past Surgical History:  Procedure Laterality Date  . APPENDECTOMY    . CARDIAC CATHETERIZATION N/A 11/27/2015   Procedure: Left Heart Cath and Coronary Angiography;  Surgeon: Belva Crome, MD;  Location: [facility];  Service: Cardiovascular;  Laterality: N/A;  . COLONOSCOPY N/A 09/01/2012   Procedure: COLONOSCOPY;  Surgeon: [name], MD;  Location[facility];  Service: Endoscopy;  Laterality: N/A;  1030  . CYSTOGRAPHY    . ESOPHAGOGASTRODUODENOSCOPY N/A 07/12/2015   Procedure: ESOPHAGOGASTRODUODENOSCOPY (EGD);  Surgeon: [name], MD;  Location: [facility];  Service: Endoscopy;  Laterality: N/A;  2:50 - moved to 1:00 - Ann to notify  . REFRACTIVE SURGERY     "to relieve glaucoma"  . ROTATOR CUFF REPAIR     bilateral shoulders  . SP FL GUIDE SPINAL INJ  May 2017     Social History   reports that he quit smoking about 35 years ago. His smoking use included cigarettes. He has a 30.00 pack-year smoking history. He has never used smokeless tobacco. He reports current alcohol use. He reports that he does not use drugs.   Family History   His family history includes Breast cancer in his mother; Hypertension in his  mother; Kidney Stones in his brother.   Allergies Allergies  Allergen Reactions  . Clams [Shellfish Allergy] Nausea And Vomiting  . Demeclocycline Rash  . Tetracyclines & Related Rash  . Tylenol [Acetaminophen] Rash     Home Medications  Prior to Admission medications   Medication Sig Start Date End Date Taking? Authorizing Provider  atorvastatin (LIPITOR) 20 MG tablet Take 1 tablet by mouth daily. 05/24/18  Yes [provider]  busPIRone (BUSPAR) 10 MG tablet Take 1 tablet by mouth 2 (two) times a day. 11/17/18  Yes [provider]  cyclobenzaprine (FLEXERIL) 10 MG tablet Take 10 mg by mouth 3 (three) times daily as needed for muscle spasms.   Yes [provider]  gabapentin (NEURONTIN) 300 MG capsule Take 300-600 mg by mouth 3 (three) times daily. Patient takes 1 capsule in the morning, 1 capsule in the evening, and 2 capsules at night   Yes [provider]  latanoprost (XALATAN) 0.005 % ophthalmic solution Place 1 drop into both eyes at bedtime. 05/26/18  Yes [provider]  Oxycodone HCl 10 MG TABS Take 10 mg by mouth every 6 (six) hours as needed.   Yes [provider]  tiZANidine (ZANAFLEX) 4 MG tablet Take 4 mg by mouth every 8 (eight) hours as needed for muscle spasms.   Yes [provider]  venlafaxine XR (EFFEXOR-XR) 75 MG 24 hr capsule TAKE 1 CAPSULE ONCE A DAY WITH BREAKFAST. 04/27/18  Yes [name], MD  aspirin EC 81 MG tablet Take 81 mg by mouth daily.    [provider]  Cholecalciferol (VITAMIN D3) 125 MCG (5000 UT) TABS Take 1 capsule by mouth daily.    [provider]  fluticasone (FLONASE) 50 MCG/ACT nasal spray INHALE 2 SPRAYS IN EACH NOSTRIL ONCE DAILY. 07/12/18   [name], MD  ibuprofen (ADVIL,MOTRIN) 200 MG tablet Take 400 mg by mouth every 6 (six) hours as needed (pain).    [provider]  Misc Natural Products (COLON CLEANSE PO) Take 1 capsule by mouth daily as needed  (constipation).    [provider]  Multiple Vitamin (MULITIVITAMIN WITH MINERALS) TABS Take 1 tablet by mouth daily.    [provider]  mupirocin ointment (BACTROBAN) 2 % Place 1 application into the nose at bedtime. For dryness from CPAP    [provider]  pantoprazole (PROTONIX) 40 MG tablet TAKE (1) TABLET BY MOUTH TWICE DAILY BEFORE MEALS (BREAKFAST AND SUPPER). 07/14/17   Setzer, Rona Ravens, NP  Polyethyl Glycol-Propyl Glycol (SYSTANE OP) Place 1 drop into both eyes daily as needed (seasonal allergies/ dry eyes).    [provider]  Probiotic Product (PROBIOTIC PO) Take 1 tablet by mouth daily.    [provider]  tamsulosin (FLOMAX) 0.4 MG CAPS capsule Take 0.4 mg by mouth daily after breakfast.  07/13/13   [provider]  zolpidem (AMBIEN) 10 MG tablet Take 1 tablet (10 mg total) by mouth at bedtime as needed for up to 30 days for sleep. 10/28/18 11/27/18  [name], MD    The patient is critically ill with multiple organ systems failure and requires high complexity decision making for assessment and support, frequent evaluation and titration of therapies, application of advanced monitoring technologies and extensive interpretation of multiple databases.   Critical Care Time devoted to patient care services described in this note is  45  Minutes. This time reflects time of care of this signee Dr Jennet Maduro. This critical care time does not reflect procedure time, or teaching time or supervisory time of PA/NP/Med student/Med Resident etc but could involve care discussion time.  Rush Farmer, M.D. [facility] Pulmonary/Critical Care Medicine. Pager: [phone]. After hours pager: [phone].

## 2018-12-19 NOTE — Progress Notes (Signed)
Spoke with Kalman Shan, gave updates and answered all questions. She expressed concern that once patient is well enough to discharge that he may not be able to care for himself immediately. She mentioned that she lives in [place] and unable to be at his residence full time. Advised patient that I would pass her concerns to social workers.

## 2018-12-19 NOTE — ED Provider Notes (Addendum)
Procedure Name: Intubation Date/Time: 12/19/2018 12:17 AM Performed by: Noemi Chapel, MD Pre-anesthesia Checklist: Patient identified, Patient being monitored, Emergency Drugs available, Timeout performed and Suction available Oxygen Delivery Method: Non-rebreather mask Preoxygenation: Pre-oxygenation with 100% oxygen Induction Type: Rapid sequence Laryngoscope Size: Mac and 4 Grade View: Grade II Tube size: 8.0 mm Number of attempts: 1 Airway Equipment and Method: Stylet (direct laryngoscopy) Placement Confirmation: ETT inserted through vocal cords under direct vision,  CO2 detector and Breath sounds checked- equal and bilateral Secured at: 22 cm Tube secured with: ETT holder Dental Injury: Teeth and Oropharynx as per pre-operative assessment  Difficulty Due To: Difficulty was unanticipated Comments:      OG placement  Date/Time: 12/19/2018 12:19 AM Performed by: Noemi Chapel, MD Authorized by: Noemi Chapel, MD  Consent: The procedure was performed in an emergent situation. Required items: required blood products, implants, devices, and special equipment available Patient identity confirmed: verbally with patient Time out: Immediately prior to procedure a "time out" was called to verify the correct patient, procedure, equipment, support staff and site/side marked as required. Preparation: Patient was prepped and draped in the usual sterile fashion. Local anesthesia used: no  Anesthesia: Local anesthesia used: no Patient tolerance: patient tolerated the procedure well with no immediate complications Comments:    .Critical Care Performed by: Noemi Chapel, MD Authorized by: Noemi Chapel, MD   Critical care provider statement:    Critical care time (minutes):  45   Critical care time was exclusive of:  Separately billable procedures and treating other patients and teaching time   Critical care was necessary to treat or prevent imminent or life-threatening deterioration  of the following conditions:  Respiratory failure   Critical care was time spent personally by me on the following activities:  Blood draw for specimens, development of treatment plan with patient or surrogate, discussions with consultants, evaluation of patient's response to treatment, examination of patient, obtaining history from patient or surrogate, ordering and performing treatments and interventions, ordering and review of laboratory studies, ordering and review of radiographic studies, pulse oximetry, re-evaluation of patient's condition and review of old charts   I was asked to see the patient as there had been a continuous decompensation throughout the last couple of hours with increased tachypnea, work of breathing and progressive hypoxia.  He is now on a nonrebreather at approximately 15 L, essentially 100% nonrebreather and has oxygen saturations of between 79 and 84%.  He is speaking in short and sentences, clearly his work of breathing is significant and worsening using accessory muscles.  He appears to be tiring out and at this point is not stable for transport on ambulance, in fact the critical care transport team said they would not transport him in this condition without definitive airway, without intubation.  I discussed with the patient at the bedside the need for intubation to maintain and secure his airway to which he agreed.  Please see intubation note above, OG placement and critical care.  The patient required sedation initially with etomidate and then with propofol after intubation.  This went smoothly, Versed was added for some extra sedation prior to transport.  I personally viewed the x-ray, the tube appears to be in the correct position.  The patient is critically ill with what appears to be respiratory failure secondary to coronavirus pneumonia.  I personally performed the ABG at approximately 1:45 AM.  This shows a pH of 7.37, PO2 of 107 and PCO2 of 57.  This is reasonable  given  the patient's severe distress prior to intubation.  Awaiting ICU bed availability, receiving intermittent doses of Versed on top of propofol with adequate sedation at this time  Final diagnoses:  SOB (shortness of breath)  Acute respiratory failure with hypoxia (HCC)  Pneumonia due to COVID-19 virus      Noemi Chapel, MD 12/19/18 Wylene Men, MD 12/19/18 Warden Fillers    Noemi Chapel, MD 12/19/18 [phone]

## 2018-12-19 NOTE — Plan of Care (Signed)
  Problem: Clinical Measurements: Goal: Ability to maintain clinical measurements within normal limits will improve Outcome: Not Progressing Goal: Will remain free from infection Outcome: Progressing Note: Foley care performed, CHG daily order obtained.  Goal: Diagnostic test results will improve Outcome: Progressing Goal: Respiratory complications will improve Outcome: Progressing Note: ABG improved after intubation. Goal: Cardiovascular complication will be avoided Outcome: Not Progressing Note: Patient sinus brady with no documented history of heart disease.    Problem: Nutrition: Goal: Adequate nutrition will be maintained Outcome: Not Progressing Note: Patient still NPO

## 2018-12-20 ENCOUNTER — Inpatient Hospital Stay (HOSPITAL_COMMUNITY): Payer: Medicare Other

## 2018-12-20 DIAGNOSIS — J9601 Acute respiratory failure with hypoxia: Secondary | ICD-10-CM

## 2018-12-20 LAB — COMPREHENSIVE METABOLIC PANEL
ALT: 86 U/L — ABNORMAL HIGH (ref 0–44)
AST: 67 U/L — ABNORMAL HIGH (ref 15–41)
Albumin: 2.2 g/dL — ABNORMAL LOW (ref 3.5–5.0)
Alkaline Phosphatase: 87 U/L (ref 38–126)
Anion gap: 10 (ref 5–15)
BUN: 31 mg/dL — ABNORMAL HIGH (ref 8–23)
CO2: 29 mmol/L (ref 22–32)
Calcium: 8.3 mg/dL — ABNORMAL LOW (ref 8.9–10.3)
Chloride: 103 mmol/L (ref 98–111)
Creatinine, Ser: 0.74 mg/dL (ref 0.61–1.24)
GFR calc Af Amer: >60 mL/min (ref >60)
GFR calc non Af Amer: >60 mL/min (ref >60)
Glucose, Bld: 202 mg/dL — ABNORMAL HIGH (ref 70–99)
Potassium: 3.2 mmol/L — ABNORMAL LOW (ref 3.5–5.1)
Sodium: 142 mmol/L (ref 135–145)
Total Bilirubin: 0.6 mg/dL (ref 0.3–1.2)
Total Protein: 5.8 g/dL — ABNORMAL LOW (ref 6.5–8.1)

## 2018-12-20 LAB — CBC WITH DIFFERENTIAL/PLATELET
Abs Immature Granulocytes: 0.07 10*3/uL (ref 0.00–0.07)
Basophils Absolute: 0 10*3/uL (ref 0.0–0.1)
Basophils Relative: 0 %
Eosinophils Absolute: 0 10*3/uL (ref 0.0–0.5)
Eosinophils Relative: 0 %
HCT: 34.6 % — ABNORMAL LOW (ref 39.0–52.0)
Hemoglobin: 12.1 g/dL — ABNORMAL LOW (ref 13.0–17.0)
Immature Granulocytes: 1 %
Lymphocytes Relative: 2 %
Lymphs Abs: 0.2 10*3/uL — ABNORMAL LOW (ref 0.7–4.0)
MCH: 33.2 pg (ref 26.0–34.0)
MCHC: 35 g/dL (ref 30.0–36.0)
MCV: 94.8 fL (ref 80.0–100.0)
Monocytes Absolute: 0.2 10*3/uL (ref 0.1–1.0)
Monocytes Relative: 2 %
Neutro Abs: 9.5 10*3/uL — ABNORMAL HIGH (ref 1.7–7.7)
Neutrophils Relative %: 95 %
Platelets: 310 10*3/uL (ref 150–400)
RBC: 3.65 MIL/uL — ABNORMAL LOW (ref 4.22–5.81)
RDW: 12.4 % (ref 11.5–15.5)
WBC: 10 10*3/uL (ref 4.0–10.5)
nRBC: 0 % (ref 0.0–0.2)

## 2018-12-20 LAB — POCT I-STAT 7, (LYTES, BLD GAS, ICA,H+H)
Acid-Base Excess: 6 mmol/L — ABNORMAL HIGH (ref 0.0–2.0)
Bicarbonate: 30.3 mmol/L — ABNORMAL HIGH (ref 20.0–28.0)
Calcium, Ion: 1.23 mmol/L (ref 1.15–1.40)
HCT: 33 % — ABNORMAL LOW (ref 39.0–52.0)
Hemoglobin: 11.2 g/dL — ABNORMAL LOW (ref 13.0–17.0)
O2 Saturation: 96 %
Patient temperature: 97.9
Potassium: 3.1 mmol/L — ABNORMAL LOW (ref 3.5–5.1)
Sodium: 141 mmol/L (ref 135–145)
TCO2: 32 mmol/L (ref 22–32)
pCO2 arterial: 42 mmHg (ref 32.0–48.0)
pH, Arterial: 7.464 — ABNORMAL HIGH (ref 7.350–7.450)
pO2, Arterial: 75 mmHg — ABNORMAL LOW (ref 83.0–108.0)

## 2018-12-20 LAB — GLUCOSE, CAPILLARY
Glucose-Capillary: 189 mg/dL — ABNORMAL HIGH (ref 70–99)
Glucose-Capillary: 195 mg/dL — ABNORMAL HIGH (ref 70–99)
Glucose-Capillary: 254 mg/dL — ABNORMAL HIGH (ref 70–99)

## 2018-12-20 LAB — PROCALCITONIN: Procalcitonin: 0.9 ng/mL

## 2018-12-20 LAB — FERRITIN: Ferritin: 2772 ng/mL — ABNORMAL HIGH (ref 24–336)

## 2018-12-20 LAB — MAGNESIUM: Magnesium: 2.5 mg/dL — ABNORMAL HIGH (ref 1.7–2.4)

## 2018-12-20 LAB — CK: Total CK: 14 U/L — ABNORMAL LOW (ref 49–397)

## 2018-12-20 LAB — C-REACTIVE PROTEIN: CRP: 18.7 mg/dL — ABNORMAL HIGH (ref <1.0)

## 2018-12-20 LAB — PHOSPHORUS: Phosphorus: 2.9 mg/dL (ref 2.5–4.6)

## 2018-12-20 LAB — D-DIMER, QUANTITATIVE: D-Dimer, Quant: 8.42 ug/mL-FEU — ABNORMAL HIGH (ref 0.00–0.50)

## 2018-12-20 MED ORDER — VENLAFAXINE HCL ER 75 MG PO CP24
75.0000 mg | ORAL_CAPSULE | Freq: Every day | ORAL | Status: DC
  Administered 2018-12-21 – 2019-01-13 (×24): 75 mg via ORAL
  Filled 2018-12-20 (×28): qty 1

## 2018-12-20 MED ORDER — ATORVASTATIN CALCIUM 10 MG PO TABS
20.0000 mg | ORAL_TABLET | Freq: Every day | ORAL | Status: DC
  Administered 2018-12-21 – 2019-01-13 (×24): 20 mg via ORAL
  Filled 2018-12-20 (×25): qty 2

## 2018-12-20 MED ORDER — SACCHAROMYCES BOULARDII 250 MG PO CAPS
250.0000 mg | ORAL_CAPSULE | Freq: Two times a day (BID) | ORAL | Status: DC
  Filled 2018-12-20 (×2): qty 1

## 2018-12-20 MED ORDER — LOPERAMIDE HCL 2 MG PO CAPS
2.0000 mg | ORAL_CAPSULE | ORAL | Status: DC | PRN
  Administered 2018-12-21: 18:00:00 2 mg via ORAL
  Filled 2018-12-20 (×2): qty 1

## 2018-12-20 MED ORDER — FUROSEMIDE 10 MG/ML IJ SOLN
40.0000 mg | Freq: Once | INTRAMUSCULAR | Status: AC
  Administered 2018-12-20: 10:00:00 40 mg via INTRAVENOUS
  Filled 2018-12-20: qty 4

## 2018-12-20 MED ORDER — BUSPIRONE HCL 10 MG PO TABS
10.0000 mg | ORAL_TABLET | Freq: Two times a day (BID) | ORAL | Status: DC
  Administered 2018-12-20 – 2018-12-28 (×15): 10 mg via ORAL
  Filled 2018-12-20 (×19): qty 1

## 2018-12-20 MED ORDER — ENSURE ENLIVE PO LIQD
237.0000 mL | Freq: Three times a day (TID) | ORAL | Status: DC
  Administered 2018-12-20 – 2019-01-13 (×57): 237 mL via ORAL
  Filled 2018-12-20 (×45): qty 237

## 2018-12-20 MED ORDER — ORAL CARE MOUTH RINSE
15.0000 mL | Freq: Two times a day (BID) | OROMUCOSAL | Status: DC
  Administered 2018-12-20 – 2019-01-13 (×42): 15 mL via OROMUCOSAL

## 2018-12-20 MED ORDER — PANTOPRAZOLE SODIUM 40 MG PO PACK: 40.0000 mg | PACK | Freq: Every day | ORAL | Status: DC

## 2018-12-20 MED ORDER — GABAPENTIN 300 MG PO CAPS
300.0000 mg | ORAL_CAPSULE | ORAL | Status: DC
  Administered 2018-12-20 – 2019-01-13 (×47): 300 mg via ORAL
  Filled 2018-12-20 (×46): qty 1

## 2018-12-20 MED ORDER — PNEUMOCOCCAL VAC POLYVALENT 25 MCG/0.5ML IJ INJ: 0.5000 mL | INJECTION | INTRAMUSCULAR | Status: DC | PRN

## 2018-12-20 MED ORDER — LOPERAMIDE HCL 2 MG PO CAPS
2.0000 mg | ORAL_CAPSULE | Freq: Once | ORAL | Status: AC
  Administered 2018-12-20: 2 mg via ORAL
  Filled 2018-12-20: qty 1

## 2018-12-20 MED ORDER — POTASSIUM CHLORIDE 10 MEQ/50ML IV SOLN
10.0000 meq | INTRAVENOUS | Status: AC
  Administered 2018-12-20 (×4): 10 meq via INTRAVENOUS
  Filled 2018-12-20 (×4): qty 50

## 2018-12-20 MED ORDER — ALPRAZOLAM 0.5 MG PO TABS
0.2500 mg | ORAL_TABLET | Freq: Two times a day (BID) | ORAL | Status: DC | PRN
  Administered 2018-12-21 – 2018-12-28 (×11): 0.25 mg via ORAL
  Filled 2018-12-20 (×11): qty 1

## 2018-12-20 MED ORDER — ALPRAZOLAM 0.5 MG PO TABS
0.2500 mg | ORAL_TABLET | ORAL | Status: AC
  Administered 2018-12-20: 11:00:00 0.25 mg via ORAL
  Filled 2018-12-20: qty 1

## 2018-12-20 MED ORDER — FLUTICASONE PROPIONATE 50 MCG/ACT NA SUSP
2.0000 | Freq: Every day | NASAL | Status: DC
  Administered 2018-12-20 – 2019-01-13 (×25): 2 via NASAL
  Filled 2018-12-20 (×4): qty 16

## 2018-12-20 MED ORDER — TRAZODONE HCL 50 MG PO TABS
50.0000 mg | ORAL_TABLET | Freq: Every evening | ORAL | Status: AC | PRN
  Administered 2018-12-20 – 2018-12-22 (×2): 50 mg via ORAL
  Filled 2018-12-20 (×2): qty 1

## 2018-12-20 MED ORDER — VENLAFAXINE HCL 37.5 MG PO TABS: 37.5000 mg | ORAL_TABLET | Freq: Two times a day (BID) | ORAL | Status: DC

## 2018-12-20 MED ORDER — BACID PO TABS: 2.0000 | ORAL_TABLET | Freq: Three times a day (TID) | ORAL | Status: DC

## 2018-12-20 MED ORDER — KETOROLAC TROMETHAMINE 30 MG/ML IJ SOLN
30.0000 mg | Freq: Once | INTRAMUSCULAR | Status: AC
  Administered 2018-12-20: 30 mg via INTRAVENOUS
  Filled 2018-12-20: qty 1

## 2018-12-20 MED ORDER — GABAPENTIN 300 MG PO CAPS
600.0000 mg | ORAL_CAPSULE | Freq: Every day | ORAL | Status: DC
  Administered 2018-12-20 – 2019-01-12 (×24): 600 mg via ORAL
  Filled 2018-12-20 (×29): qty 2

## 2018-12-20 MED FILL — Fentanyl Citrate Preservative Free (PF) Inj 2500 MCG/50ML: INTRAMUSCULAR | Qty: 50 | Status: AC

## 2018-12-20 MED FILL — Sodium Chloride IV Soln 0.9%: INTRAVENOUS | Qty: 250 | Status: AC

## 2018-12-20 NOTE — Progress Notes (Signed)
KUB showed severe gaseous distention. Received verbal order to place NG tube. NG tube in place and on low-intermittent suction. Pt still moaning with pain but not to the extent as before. Will continue to monitor and assess the patient.

## 2018-12-20 NOTE — Consult Note (Signed)
NAME:  Joel Greene, MRN:  [id number], DOB:  12/22/1950, LOS: 2 ADMISSION DATE:  12/18/2018, CONSULTATION DATE:  12/19/2018 REFERRING MD:  [facility] - Memon, CHIEF COMPLAINT:  Acute respiratory failure with COVID   Brief History   68 year old male with history of bronchitis presenting with COVID related acute respiratory failure requiring intubation in [place] and transferred to [facility] for further management.  Patient had history of cough, fever and chills and presented with acute hypoxemia failing to respond to HFNC and required intubation.  Patient is sedated and intubated, history from records.   History of present illness   68 year old male with history of bronchitis presenting with COVID related acute respiratory failure requiring intubation in [place] and transferred to [facility] for further management.  Patient had history of cough, fever and chills and presented with acute hypoxemia failing to respond to HFNC and required intubation.  Patient is sedated and intubated, history from records.   Past Medical History  Bronchitis and IBS not on immune modulators  Significant Hospital Events   6/20 intubation for COVID associated hypoxemic respiratory failure  Consults:  PCCM  Procedures:  N/A  Significant Diagnostic Tests:  CXR 6/21 that I reviewed myself, R>L airspace disease  Micro Data:  6/20 COVID +++  Antimicrobials:  "Remdesivir" 6/20 Rocephin 6/21>>> Zithromax 6/21>>>  Interim history/subjective:  Passed SBT this morning.  Objective   Blood pressure (!) 141/76, pulse 79, temperature 98.4 F (36.9 C), temperature source Oral, resp. rate (!) 25, height 5\' 7"  (1.702 m), weight 75.2 kg, SpO2 (!) 86 %.    Vent Mode: PRVC FiO2 (%):  [40 %-70 %] 40 % Set Rate:  [15 bmp] 15 bmp Vt Set:  [520 mL] 520 mL PEEP:  [8 cmH20-10 cmH20] 8 cmH20 Plateau Pressure:  [22 cmH20-24 cmH20] 23 cmH20   Intake/Output Summary (Last 24 hours) at 12/20/2018 1053 Last data filed at 12/20/2018 0800  Gross per 24 hour  Intake 2092.34 ml  Output 550 ml  Net 1542.34 ml   Filed Weights   12/18/18 1307 12/19/18 0510 12/20/18 0445  Weight: 79.4 kg 75.8 kg 75.2 kg    Examination: General: Acutely ill appearing male, NAD HENT: [place]/AT, PERRL, EOM-I and MMM, ETT in place Lungs: Crackles bilaterally. Cardiovascular: RRR, Nl S1/S2 and -M/R/G Abdomen: Soft, NT, ND and +BS Extremities: -edema and -tenderness Neuro: Awake and following commands with normal strength. Skin: Intact  Resolved Hospital Problem list   N/A  Assessment & Plan:  Critically ill due to respiratory failure requiring mechanical ventilation Passed SBT. For extubation today to high-flow O2. Diuresis.  Hypokalemia.  Replacement.  Progress diet post extubation.  Labs   CBC: Recent Labs  Lab 12/18/18 1333 12/19/18 0555 12/20/18 0120 12/20/18 0517  WBC 6.9  --  10.0  --   NEUTROABS 6.4  --  9.5*  --   HGB 14.0 11.2* 12.1* 11.2*  HCT 38.5* 33.0* 34.6* 33.0*  MCV 91.9  --  94.8  --   PLT 304  --  310  --     Basic Metabolic Panel: Recent Labs  Lab 12/18/18 1333 12/19/18 0555 12/19/18 0729 12/20/18 0120 12/20/18 0517  NA 132* 139 139 142 141  K 2.8* 2.5* 2.7* 3.2* 3.1*  CL 90*  --  97* 103  --   CO2 31  --  29 29  --   GLUCOSE 151*  --  197* 202*  --   BUN 19  --  16 31*  --  CREATININE 0.91  --  0.70 0.74  --   CALCIUM 8.6*  --  8.2* 8.3*  --   MG  --   --  2.5* 2.5*  --   PHOS  --   --   --  2.9  --    GFR: Estimated Creatinine Clearance: 81.5 mL/min (by C-G formula based on SCr of 0.74 mg/dL). Recent Labs  Lab 12/18/18 1333 12/18/18 1600 12/19/18 0729 12/20/18 0120  PROCALCITON 1.24  --  1.15 0.90  WBC 6.9  --   --  10.0  LATICACIDVEN 3.5* 2.3*  --   --     Liver Function Tests: Recent Labs  Lab 12/18/18 1333 12/19/18 0729 12/20/18 0120  AST 110* 63* 67*  ALT 119* 86* 86*  ALKPHOS 108 89 87  BILITOT 1.2 0.5 0.6  PROT 6.7 6.0* 5.8*  ALBUMIN 2.7* 2.4* 2.2*   Recent  Labs  Lab 12/18/18 1333  LIPASE 51   No results for input(s): AMMONIA in the last 168 hours.  ABG    Component Value Date/Time   PHART 7.464 (H) 12/20/2018 0517   PCO2ART 42.0 12/20/2018 0517   PO2ART 75.0 (L) 12/20/2018 0517   HCO3 30.3 (H) 12/20/2018 0517   TCO2 32 12/20/2018 0517   O2SAT 96.0 12/20/2018 0517     Coagulation Profile: No results for input(s): INR, PROTIME in the last 168 hours.  Cardiac Enzymes: Recent Labs  Lab 12/18/18 1333 12/20/18 0120  CKTOTAL 35* 14*  TROPONINI 0.03*  --     HbA1C: No results found for: HGBA1C  CBG: Recent Labs  Lab 12/19/18 1315 12/19/18 1746 12/19/18 1939 12/20/18 0330 12/20/18 0736  GLUCAP 175* 193* 175* 189* 195*   CRITICAL CARE Performed by: Kipp Brood   Total critical care time: 30 minutes  Critical care time was exclusive of separately billable procedures and treating other patients.  Critical care was necessary to treat or prevent imminent or life-threatening deterioration.  Critical care was time spent personally by me on the following activities: development of treatment plan with patient and/or surrogate as well as nursing, discussions with consultants, evaluation of patient's response to treatment, examination of patient, obtaining history from patient or surrogate, ordering and performing treatments and interventions, ordering and review of laboratory studies, ordering and review of radiographic studies, pulse oximetry, re-evaluation of patient's condition and participation in multidisciplinary rounds.  Kipp Brood, MD [facility] ICU Physician [place]  Pager: [phone] Mobile: [phone] After hours: [phone].

## 2018-12-20 NOTE — Progress Notes (Signed)
Update given to patient's girlfriend, Kalman Shan, and brother, Audry Pili, on patients status and plan of care. All questions answered.

## 2018-12-20 NOTE — Plan of Care (Signed)
  Problem: Education: Goal: Knowledge of General Education information will improve Description: Patient currently intubated and sedated. Will continue to monitor.  Outcome: Progressing   Problem: Clinical Measurements: Goal: Ability to maintain clinical measurements within normal limits will improve Outcome: Progressing Goal: Will remain free from infection Outcome: Progressing Goal: Diagnostic test results will improve Outcome: Progressing Goal: Cardiovascular complication will be avoided Outcome: Progressing   Problem: Activity: Goal: Risk for activity intolerance will decrease Outcome: Progressing   Problem: Nutrition: Goal: Adequate nutrition will be maintained Outcome: Progressing   Problem: Coping: Goal: Level of anxiety will decrease Outcome: Progressing   Problem: Elimination: Goal: Will not experience complications related to bowel motility Outcome: Progressing Goal: Will not experience complications related to urinary retention Outcome: Progressing

## 2018-12-20 NOTE — Progress Notes (Addendum)
[place] became newly flushed-red in the face and chest. Fever of 102 rectally with ST in low 100s. Attending MD paged and made aware. Toradol ordered and given. Patient has no complaints of wheezing or itching. Will continue to monitor.

## 2018-12-20 NOTE — Progress Notes (Signed)
PROGRESS NOTE    Joel Greene  MEB:[id number] DOB: [birth year] DOA: 12/18/2018 PCP: Redmond School, MD    Brief Narrative:  68 year old male was initially evaluated at a [facility] hospital and transferred to [place] when he tested positive for COVID-19.  He had presented with fevers, chills, shortness of breath.  He became progressively hypoxic and tachypneic.  He was intubated in the emergency room prior to transfer to [facility].  Currently on steroids, remdesivir and antibiotics.   Assessment & Plan:   Active Problems:   Acute respiratory failure with hypoxia (HCC)   Sepsis due to undetermined organism ([place])   Acute respiratory disease due to COVID-19 virus   Respiratory failure with hypoxia (HCC)   Hypokalemia   1. Acute respiratory failure with hypoxia.  Secondary to COVID-19 pneumonia.  Patient was intubated in the emergency room.  PCCM following for vent management.  Appears to be performing well during spontaneous breathing trial.  Plans are for likely extubation later today. 2. Sepsis secondary to COVID-19.  Blood cultures have shown no growth thus far.  Procalcitonin was elevated on admission at 1.1.  Lactic acid was initially elevated, but is trending down.  Started on antibiotic coverage for community-acquired pneumonia.  Follow-up culture results.   3. Hypokalemia.  Replace.  Magnesium is normal.   DVT prophylaxis: Lovenox Code Status: Full code Family Communication: left VM for significant other on 6/22 Disposition Plan: Continue management in ICU   Consultants:   PCCM  Procedures:   Intubation 6/20 >  Antimicrobials:   Azithromycin 6/21 >  Ceftriaxone 6/21 >  Remdesivir 6/20 >   Subjective: No issues overnight.  Doing well on spontaneous breathing trial.  Objective: Vitals:   12/20/18 1500 12/20/18 1503 12/20/18 1600 12/20/18 1700  BP: (!) 122/53 (!) 122/53 128/61 124/71  Pulse:  (!) 103 (!) 110   Resp:  (!) 30 (!) 30    Temp: (!) 102 F (38.9 C)  98.2 F (36.8 C)   TempSrc: Rectal  Oral   SpO2:  99% 94%   Weight:      Height:        Intake/Output Summary (Last 24 hours) at 12/20/2018 1737 Last data filed at 12/20/2018 1600 Gross per 24 hour  Intake 1680.56 ml  Output 2550 ml  Net -869.44 ml   Filed Weights   12/18/18 1307 12/19/18 0510 12/20/18 0445  Weight: 79.4 kg 75.8 kg 75.2 kg    Examination:  General exam: Sitting up in bed, intubated, no distress Respiratory system: Clear to auscultation. Respiratory effort normal. Cardiovascular system:RRR. No murmurs, rubs, gallops. Gastrointestinal system: Abdomen is nondistended, soft and nontender. No organomegaly or masses felt. Normal bowel sounds heard. Central nervous system: . No focal neurological deficits. Extremities: No C/C/E, +pedal pulses Skin: No rashes, lesions or ulcers Psychiatry: Unable to assess     Data Reviewed: I have personally reviewed following labs and imaging studies  CBC: Recent Labs  Lab 12/18/18 1333 12/19/18 0555 12/20/18 0120 12/20/18 0517  WBC 6.9  --  10.0  --   NEUTROABS 6.4  --  9.5*  --   HGB 14.0 11.2* 12.1* 11.2*  HCT 38.5* 33.0* 34.6* 33.0*  MCV 91.9  --  94.8  --   PLT 304  --  310  --    Basic Metabolic Panel: Recent Labs  Lab 12/18/18 1333 12/19/18 0555 12/19/18 0729 12/20/18 0120 12/20/18 0517  NA 132* 139 139 142 141  K 2.8* 2.5* 2.7*  3.2* 3.1*  CL 90*  --  97* 103  --   CO2 31  --  29 29  --   GLUCOSE 151*  --  197* 202*  --   BUN 19  --  16 31*  --   CREATININE 0.91  --  0.70 0.74  --   CALCIUM 8.6*  --  8.2* 8.3*  --   MG  --   --  2.5* 2.5*  --   PHOS  --   --   --  2.9  --    GFR: Estimated Creatinine Clearance: 81.5 mL/min (by C-G formula based on SCr of 0.74 mg/dL). Liver Function Tests: Recent Labs  Lab 12/18/18 1333 12/19/18 0729 12/20/18 0120  AST 110* 63* 67*  ALT 119* 86* 86*  ALKPHOS 108 89 87  BILITOT 1.2 0.5 0.6  PROT 6.7 6.0* 5.8*  ALBUMIN 2.7*  2.4* 2.2*   Recent Labs  Lab 12/18/18 1333  LIPASE 51   No results for input(s): AMMONIA in the last 168 hours. Coagulation Profile: No results for input(s): INR, PROTIME in the last 168 hours. Cardiac Enzymes: Recent Labs  Lab 12/18/18 1333 12/20/18 0120  CKTOTAL 35* 14*  TROPONINI 0.03*  --    BNP (last 3 results) No results for input(s): PROBNP in the last 8760 hours. HbA1C: No results for input(s): HGBA1C in the last 72 hours. CBG: Recent Labs  Lab 12/19/18 1315 12/19/18 1746 12/19/18 1939 12/20/18 0330 12/20/18 0736  GLUCAP 175* 193* 175* 189* 195*   Lipid Profile: No results for input(s): CHOL, HDL, LDLCALC, TRIG, CHOLHDL, LDLDIRECT in the last 72 hours. Thyroid Function Tests: No results for input(s): TSH, T4TOTAL, FREET4, T3FREE, THYROIDAB in the last 72 hours. Anemia Panel: Recent Labs    12/18/18 1333 12/20/18 0120  FERRITIN 2,370* 2,772*   Sepsis Labs: Recent Labs  Lab 12/18/18 1333 12/18/18 1600 12/19/18 0729 12/20/18 0120  PROCALCITON 1.24  --  1.15 0.90  LATICACIDVEN 3.5* 2.3*  --   --     Recent Results (from the past 240 hour(s))  SARS Coronavirus 2 (CEPHEID - Performed in [place] hospital lab), Hosp Order     Status: Abnormal   Collection Time: 12/18/18  1:13 PM   Specimen: Nasopharyngeal Swab  Result Value Ref Range Status   SARS Coronavirus 2 POSITIVE (A) NEGATIVE Final    Comment: CRITICAL RESULT CALLED TO, READ BACK BY AND VERIFIED WITH: A. TUTTLE '@[id number]'   12/18/18 KAY (NOTE) If result is NEGATIVE SARS-CoV-2 target nucleic acids are NOT DETECTED. The SARS-CoV-2 RNA is generally detectable in upper and lower  respiratory specimens during the acute phase of infection. The lowest  concentration of SARS-CoV-2 viral copies this assay can detect is 250  copies / mL. A negative result does not preclude SARS-CoV-2 infection  and should not be used as the sole basis for treatment or other  patient management decisions.  A negative  result may occur with  improper specimen collection / handling, submission of specimen other  than nasopharyngeal swab, presence of viral mutation(s) within the  areas targeted by this assay, and inadequate number of viral copies  (<250 copies / mL). A negative result must be combined with clinical  observations, patient history, and epidemiological information. If result is POSITIVE SARS-CoV-2 target nucleic acids are DETECTED.  The SARS-CoV-2 RNA is generally detectable in upper and lower  respiratory specimens during the acute phase of infection.  Positive  results are indicative of active infection with  SARS-CoV-2.  Clinical  correlation with patient history and other diagnostic information is  necessary to determine patient infection status.  Positive results do  not rule out bacterial infection or co-infection with other viruses. If result is PRESUMPTIVE POSTIVE SARS-CoV-2 nucleic acids MAY BE PRESENT.   A presumptive positive result was obtained on the submitted specimen  and confirmed on repeat testing.  While 2019 novel coronavirus  (SARS-CoV-2) nucleic acids may be present in the submitted sample  additional confirmatory testing may be necessary for epidemiological  and / or clinical management purposes  to differentiate between  SARS-CoV-2 and other Sarbecovirus currently known to infect humans.  If clinically indicated additional testing with an alternate test  methodology [phone])  is advised. The SARS-CoV-2 RNA is generally  detectable in upper and lower respiratory specimens during the acute  phase of infection. The expected result is Negative. Fact Sheet for Patients:  StrictlyIdeas.no Fact Sheet for Healthcare Providers: BankingDealers.co.za This test is not yet approved or cleared by the [place] FDA and has been authorized for detection and/or diagnosis of SARS-CoV-2 by FDA under an Emergency Use Authorization  (EUA).  This EUA will remain in effect (meaning this test can be used) for the duration of the COVID-19 declaration under Section 564(b)(1) of the Act, 21 U.S.C. section 360bbb-3(b)(1), unless the authorization is terminated or revoked sooner. Performed at [facility], [address] [place], [place] [address]   Blood Culture (routine x 2)     Status: None (Preliminary result)   Collection Time: 12/18/18  1:44 PM   Specimen: Left Antecubital; Blood  Result Value Ref Range Status   Specimen Description   Final    LEFT ANTECUBITAL BOTTLES DRAWN AEROBIC AND ANAEROBIC   Special Requests Blood Culture adequate volume  Final   Culture   Final    NO GROWTH < 24 HOURS Performed at [facility], [address], [place], [place] [address]    Report Status PENDING  Incomplete  Blood Culture (routine x 2)     Status: None (Preliminary result)   Collection Time: 12/18/18  1:45 PM   Specimen: BLOOD RIGHT WRIST  Result Value Ref Range Status   Specimen Description   Final    BLOOD RIGHT WRIST BOTTLES DRAWN AEROBIC AND ANAEROBIC   Special Requests Blood Culture adequate volume  Final   Culture   Final    NO GROWTH < 24 HOURS Performed at [facility], [address], [place], [place] [address]    Report Status PENDING  Incomplete  MRSA PCR Screening     Status: None   Collection Time: 12/19/18  5:24 AM   Specimen: Nasal Mucosa; Nasopharyngeal  Result Value Ref Range Status   MRSA by PCR NEGATIVE NEGATIVE Final    Comment:        The GeneXpert MRSA Assay (FDA approved for NASAL specimens only), is one component of a comprehensive MRSA colonization surveillance program. It is not intended to diagnose MRSA infection nor to guide or monitor treatment for MRSA infections. Performed at [facility], [place] [address], [place], [place] [address]          Radiology Studies: Dg Chest Port 1 View  Result Date: 12/20/2018 CLINICAL DATA:  Intubation. EXAM: PORTABLE CHEST 1  VIEW COMPARISON:  12/19/2018. FINDINGS: Right PICC line noted with tip over superior vena cava. Endotracheal tube, NG tube in stable position. Heart size stable. Interim improvement and bilateral pulmonary infiltrates with mild residual interstitial prominence. No pleural  effusion or pneumothorax. IMPRESSION: 1. Right PICC line noted with tip over superior vena cava. Endotracheal tube and NG tube stable position. 2. Improvement of bilateral pulmonary infiltrates with mild residual interstitial prominence. Electronically Signed   By: Marcello Moores  Register   On: 12/20/2018 06:16   Dg Chest Port 1 View  Result Date: 12/19/2018 CLINICAL DATA:  Status post intubation and OG tube placement. EXAM: PORTABLE CHEST 1 VIEW COMPARISON:  Single-view of the chest 12/18/2018 at 1338 hours. FINDINGS: Endotracheal tube is in place with the tip in good position below the clavicular heads. OG tube is also seen. The tube courses into the duodenum below the inferior margin the film. Extensive bilateral airspace disease is greater on the right and has progressed since the prior exam. Heart size is upper normal. No pneumothorax. IMPRESSION: ETT in good position. OG tube courses into the duodenum and below the inferior margin the film. Right greater than left airspace disease appears worse than on the comparison exam. Electronically Signed   By: Inge Rise M.D.   On: 12/19/2018 00:57   [place] Ekg Site Rite  Result Date: 12/19/2018 If Site Rite image not attached, placement could not be confirmed due to current cardiac rhythm.       Scheduled Meds: . aspirin  81 mg Oral Daily  . atorvastatin  20 mg Oral Daily  . busPIRone  10 mg Oral BID  . chlorhexidine gluconate (MEDLINE KIT)  15 mL Mouth Rinse BID  . Chlorhexidine Gluconate Cloth  6 each Topical Daily  . cholecalciferol  5,000 Units Oral Daily  . enoxaparin (LOVENOX) injection  40 mg Subcutaneous Q12H  . feeding supplement (ENSURE ENLIVE)  237 mL Oral TID BM  .  fluticasone  2 spray Each Nare Daily  . gabapentin  300 mg Oral 2 times per day   And  . gabapentin  600 mg Oral QHS  . latanoprost  1 drop Both Eyes QHS  . mouth rinse  15 mL Mouth Rinse 10 times per day  . methylPREDNISolone (SOLU-MEDROL) injection  40 mg Intravenous Q12H  . multivitamin  15 mL Per Tube Daily  . pantoprazole sodium  40 mg Per Tube BID  . sodium chloride flush  10-40 mL Intracatheter Q12H  . tamsulosin  0.4 mg Oral QPC breakfast  . [START ON 12/21/2018] venlafaxine XR  75 mg Oral Q breakfast   Continuous Infusions: . azithromycin 500 mg (12/20/18 0926)  . cefTRIAXone (ROCEPHIN)  IV Stopped (12/20/18 1653)  . remdesivir 100 mg in NS 250 mL Stopped (12/20/18 0900)     LOS: 2 days    Critical care Time spent: 50mns    JKathie Dike MD [facility]   If 7PM-7AM, please contact night-coverage [email or web address]  12/20/2018, 5:37 PM

## 2018-12-20 NOTE — Progress Notes (Signed)
Pt with history of anxiety asking for something to help him sleep at night. Xanax 0.25 mg was ordered but per off going nurse, he has repeatedly asked for something different stating he takes Ambien at home. Explained that with the side effect of respiratory depression related to [place], doctors did not want to use. Paged Dr. Roger Shelter for meds. Dr. Durward Fortes patient can have Trazadone 50mg  QHS.

## 2018-12-20 NOTE — Progress Notes (Signed)
Attending MD notified of 6 loose/watery bowel movements since extubation. Patient also complained of diarrhea pre-admission. One time dose of imodium ordered and given.

## 2018-12-20 NOTE — Progress Notes (Signed)
 Initial Nutrition Assessment  RD working remotely.  DOCUMENTATION CODES:   Not applicable  INTERVENTION:   Ensure Enlive po TID, each supplement provides 350 kcal and 20 grams of protein  Advance diet as tolerated to REGULAR   NUTRITION DIAGNOSIS:   Inadequate oral intake related to acute illness as evidenced by per patient/family report, NPO status.  GOAL:   Patient will meet greater than or equal to 90% of their needs  MONITOR:   PO intake, Supplement acceptance, Labs, Weight trends, Diet advancement  REASON FOR ASSESSMENT:   Consult Enteral/tube feeding initiation and management  ASSESSMENT:   68 yo male admitted with COVID-19 related acute respiratory failure requiring intubation at [facility] and transferred to [facility].  PMH includes bronchitis, IBS.   6/20 Intubated 6/21 Adult TF protocol started 6/22 Extubated  Extubated to HFNC (12L/min), diet advanced to CL  Pt reports fever, coughing, sob and generalized weakness for 10 days; +nausea, no vomiting  Current wt 75.2 kg; per wt encounters, weight has trended down. Noted weight 87 kg in October 2019. 13.8 % wt loss in 8 months  Labs: potassium 3.1 (L),  Meds: cholecalciferol, solumedrol, MVI, KCl  NUTRITION - FOCUSED PHYSICAL EXAM:  Unable to assess  Diet Order:   Diet Order            Diet clear liquid Room service appropriate? Yes; Fluid consistency: Thin  Diet effective now              EDUCATION NEEDS:   Not appropriate for education at this time  Skin:  Skin Assessment: Reviewed RN Assessment  Last BM:  prior to admission  Height:   Ht Readings from Last 1 Encounters:  12/19/18 5\' 7"  (1.702 m)    Weight:   Wt Readings from Last 1 Encounters:  12/20/18 75.2 kg    BMI:  Body mass index is 25.97 kg/m.  Estimated Nutritional Needs:   Kcal:  1900-2100 kcals  Protein:  95-105 g  Fluid:  >/= 1.9 L   Jaimey Franchini MS, RDN, LDN, CNSC [phone] Pager  [phone]  Weekend/On-Call Pager

## 2018-12-20 NOTE — Progress Notes (Addendum)
Pt has been c/o his stomach hurting since shift began. His stomach is distended, taut, TTT and has had multiple episodes of type 7 diarrhea, mild tachycardia. Last oral temp is 99.7 and patient is flushed throughout his face. Sent secure chat to  Dr. Roger Shelter who advised he would look into his chart and patient may need KUB. Patient is moaning and "crying" in severe pain with his stomach.

## 2018-12-20 NOTE — Procedures (Addendum)
Extubation Procedure Note  Patient Details:   Name: Joel Greene DOB: 26-Dec-1950 MRN: [id number]   Airway Documentation:    Vent end date: 12/20/18 Vent end time: 0928   Evaluation  O2 sats: stable throughout Complications: No apparent complications Patient did tolerate procedure well. Bilateral Breath Sounds: Clear, Diminished   Yes   Pt extubated to 15L HFNC per protocol. Positive cuff leak noted prior to extubation. Pt able to speak and has a strong productive cough post extubation. Pt encouraged to use [place] to clear secretions and to not strain when he tries to talk to avoid stridor.   [name] 12/20/2018, 9:34 AM

## 2018-12-20 NOTE — Progress Notes (Signed)
Inpatient Diabetes Program Recommendations  AACE/ADA: New Consensus Statement on Inpatient Glycemic Control (2015)  Target Ranges:  Prepandial:   less than 140 mg/dL      Peak postprandial:   less than 180 mg/dL (1-2 hours)      Critically ill patients:  140 - 180 mg/dL   Lab Results  Component Value Date   GLUCAP 195 (H) 12/20/2018    Review of Glycemic Control Results for Joel Greene, Joel Greene (MRN [id number]) as of 12/20/2018 13:24  Ref. Range 12/19/2018 19:39 12/20/2018 03:30 12/20/2018 07:36  Glucose-Capillary Latest Ref Range: 70 - 99 mg/dL 175 (H) 189 (H) 195 (H)   Diabetes history: None Outpatient Diabetes medications: None Current orders for Inpatient glycemic control: None  Inpatient Diabetes Program Recommendations:    If appropriate, consider adding Novolog sensitive q 4 hours.   Thanks,  Adah Perl, RN, BC-ADM Inpatient Diabetes Coordinator Pager [phone] (8a-5p)

## 2018-12-21 LAB — PHOSPHORUS: Phosphorus: 2.2 mg/dL — ABNORMAL LOW (ref 2.5–4.6)

## 2018-12-21 LAB — CBC WITH DIFFERENTIAL/PLATELET
Abs Immature Granulocytes: 0.11 10*3/uL — ABNORMAL HIGH (ref 0.00–0.07)
Basophils Absolute: 0 10*3/uL (ref 0.0–0.1)
Basophils Relative: 0 %
Eosinophils Absolute: 0 10*3/uL (ref 0.0–0.5)
Eosinophils Relative: 0 %
HCT: 33 % — ABNORMAL LOW (ref 39.0–52.0)
Hemoglobin: 11.4 g/dL — ABNORMAL LOW (ref 13.0–17.0)
Immature Granulocytes: 1 %
Lymphocytes Relative: 3 %
Lymphs Abs: 0.3 10*3/uL — ABNORMAL LOW (ref 0.7–4.0)
MCH: 32.9 pg (ref 26.0–34.0)
MCHC: 34.5 g/dL (ref 30.0–36.0)
MCV: 95.1 fL (ref 80.0–100.0)
Monocytes Absolute: 0.2 10*3/uL (ref 0.1–1.0)
Monocytes Relative: 2 %
Neutro Abs: 7.8 10*3/uL — ABNORMAL HIGH (ref 1.7–7.7)
Neutrophils Relative %: 94 %
Platelets: 345 10*3/uL (ref 150–400)
RBC: 3.47 MIL/uL — ABNORMAL LOW (ref 4.22–5.81)
RDW: 12.6 % (ref 11.5–15.5)
WBC: 8.4 10*3/uL (ref 4.0–10.5)
nRBC: 0 % (ref 0.0–0.2)

## 2018-12-21 LAB — COMPREHENSIVE METABOLIC PANEL
ALT: 107 U/L — ABNORMAL HIGH (ref 0–44)
AST: 80 U/L — ABNORMAL HIGH (ref 15–41)
Albumin: 2 g/dL — ABNORMAL LOW (ref 3.5–5.0)
Alkaline Phosphatase: 90 U/L (ref 38–126)
Anion gap: 9 (ref 5–15)
BUN: 29 mg/dL — ABNORMAL HIGH (ref 8–23)
CO2: 29 mmol/L (ref 22–32)
Calcium: 8.1 mg/dL — ABNORMAL LOW (ref 8.9–10.3)
Chloride: 106 mmol/L (ref 98–111)
Creatinine, Ser: 0.88 mg/dL (ref 0.61–1.24)
GFR calc Af Amer: >60 mL/min (ref >60)
GFR calc non Af Amer: >60 mL/min (ref >60)
Glucose, Bld: 221 mg/dL — ABNORMAL HIGH (ref 70–99)
Potassium: 3.1 mmol/L — ABNORMAL LOW (ref 3.5–5.1)
Sodium: 144 mmol/L (ref 135–145)
Total Bilirubin: 0.4 mg/dL (ref 0.3–1.2)
Total Protein: 5.2 g/dL — ABNORMAL LOW (ref 6.5–8.1)

## 2018-12-21 LAB — GLUCOSE, CAPILLARY
Glucose-Capillary: 204 mg/dL — ABNORMAL HIGH (ref 70–99)
Glucose-Capillary: 208 mg/dL — ABNORMAL HIGH (ref 70–99)
Glucose-Capillary: 248 mg/dL — ABNORMAL HIGH (ref 70–99)
Glucose-Capillary: 183 mg/dL — ABNORMAL HIGH (ref 70–99)
Glucose-Capillary: 147 mg/dL — ABNORMAL HIGH (ref 70–99)

## 2018-12-21 LAB — C DIFFICILE QUICK SCREEN W PCR REFLEX
C Diff antigen: NEGATIVE
C Diff interpretation: NOT DETECTED
C Diff toxin: NEGATIVE

## 2018-12-21 LAB — FERRITIN: Ferritin: 1831 ng/mL — ABNORMAL HIGH (ref 24–336)

## 2018-12-21 LAB — MAGNESIUM: Magnesium: 2.2 mg/dL (ref 1.7–2.4)

## 2018-12-21 LAB — HEMOGLOBIN A1C
Hgb A1c MFr Bld: 6.5 % — ABNORMAL HIGH (ref 4.8–5.6)
Mean Plasma Glucose: 139.85 mg/dL

## 2018-12-21 LAB — D-DIMER, QUANTITATIVE: D-Dimer, Quant: 6.2 ug/mL-FEU — ABNORMAL HIGH (ref 0.00–0.50)

## 2018-12-21 LAB — CK: Total CK: 14 U/L — ABNORMAL LOW (ref 49–397)

## 2018-12-21 LAB — C-REACTIVE PROTEIN: CRP: 11.3 mg/dL — ABNORMAL HIGH (ref <1.0)

## 2018-12-21 LAB — PROCALCITONIN: Procalcitonin: 0.58 ng/mL

## 2018-12-21 MED ORDER — SODIUM PHOSPHATES 45 MMOLE/15ML IV SOLN
10.0000 mmol | Freq: Once | INTRAVENOUS | Status: AC
  Administered 2018-12-21: 10 mmol via INTRAVENOUS
  Filled 2018-12-21: qty 3.33

## 2018-12-21 MED ORDER — SACCHAROMYCES BOULARDII 250 MG PO CAPS
250.0000 mg | ORAL_CAPSULE | Freq: Two times a day (BID) | ORAL | Status: DC
  Administered 2018-12-21: 08:00:00 250 mg
  Filled 2018-12-21: qty 1

## 2018-12-21 MED ORDER — PANTOPRAZOLE SODIUM 40 MG PO TBEC
40.0000 mg | DELAYED_RELEASE_TABLET | Freq: Every day | ORAL | Status: DC
  Administered 2018-12-21 – 2019-01-13 (×24): 40 mg via ORAL
  Filled 2018-12-21 (×24): qty 1

## 2018-12-21 MED ORDER — ACETAMINOPHEN 325 MG PO TABS
650.0000 mg | ORAL_TABLET | Freq: Four times a day (QID) | ORAL | Status: DC | PRN
  Administered 2018-12-21 – 2019-01-13 (×17): 650 mg via ORAL
  Filled 2018-12-21 (×18): qty 2

## 2018-12-21 MED ORDER — HYDROMORPHONE HCL 1 MG/ML IJ SOLN
1.0000 mg | INTRAMUSCULAR | Status: AC
  Administered 2018-12-21: 1 mg via INTRAVENOUS
  Filled 2018-12-21: qty 1

## 2018-12-21 MED ORDER — POTASSIUM CHLORIDE 10 MEQ/50ML IV SOLN
10.0000 meq | INTRAVENOUS | Status: AC
  Administered 2018-12-21 (×6): 10 meq via INTRAVENOUS
  Filled 2018-12-21 (×6): qty 50

## 2018-12-21 MED ORDER — HYDROMORPHONE HCL 1 MG/ML IJ SOLN
0.5000 mg | INTRAMUSCULAR | Status: DC | PRN
  Administered 2018-12-21 (×3): 0.5 mg via INTRAVENOUS
  Filled 2018-12-21 (×3): qty 0.5

## 2018-12-21 MED ORDER — ZOLPIDEM TARTRATE 5 MG PO TABS
5.0000 mg | ORAL_TABLET | Freq: Every evening | ORAL | Status: DC | PRN
  Filled 2018-12-21: qty 1

## 2018-12-21 MED ORDER — INSULIN ASPART 100 UNIT/ML ~~LOC~~ SOLN
0.0000 [IU] | Freq: Three times a day (TID) | SUBCUTANEOUS | Status: DC
  Administered 2018-12-21: 4 [IU] via SUBCUTANEOUS
  Administered 2018-12-21: 12:00:00 7 [IU] via SUBCUTANEOUS
  Administered 2018-12-22: 13:00:00 11 [IU] via SUBCUTANEOUS
  Administered 2018-12-22: 09:00:00 4 [IU] via SUBCUTANEOUS
  Administered 2018-12-22: 17:00:00 7 [IU] via SUBCUTANEOUS
  Administered 2018-12-23: 3 [IU] via SUBCUTANEOUS
  Administered 2018-12-23 (×2): 4 [IU] via SUBCUTANEOUS
  Administered 2018-12-24: 2 [IU] via SUBCUTANEOUS
  Administered 2018-12-24: 18:00:00 7 [IU] via SUBCUTANEOUS
  Administered 2018-12-24: 09:00:00 3 [IU] via SUBCUTANEOUS
  Administered 2018-12-25: 16:00:00 7 [IU] via SUBCUTANEOUS
  Administered 2018-12-26 (×2): 11 [IU] via SUBCUTANEOUS
  Administered 2018-12-26: 4 [IU] via SUBCUTANEOUS
  Administered 2018-12-27: 11 [IU] via SUBCUTANEOUS
  Administered 2018-12-27: 7 [IU] via SUBCUTANEOUS
  Administered 2018-12-27: 3 [IU] via SUBCUTANEOUS
  Administered 2018-12-28: 7 [IU] via SUBCUTANEOUS
  Administered 2018-12-28: 15 [IU] via SUBCUTANEOUS
  Administered 2018-12-28: 4 [IU] via SUBCUTANEOUS
  Administered 2018-12-29: 18:00:00 11 [IU] via SUBCUTANEOUS
  Administered 2018-12-29: 12:00:00 4 [IU] via SUBCUTANEOUS
  Administered 2018-12-29 – 2018-12-30 (×2): 11 [IU] via SUBCUTANEOUS
  Administered 2018-12-30: 10:00:00 4 [IU] via SUBCUTANEOUS
  Administered 2018-12-30 – 2018-12-31 (×2): 15 [IU] via SUBCUTANEOUS

## 2018-12-21 MED ORDER — BACID PO TABS: 2.0000 | ORAL_TABLET | Freq: Three times a day (TID) | ORAL | Status: DC

## 2018-12-21 MED ORDER — SACCHAROMYCES BOULARDII 250 MG PO CAPS
250.0000 mg | ORAL_CAPSULE | Freq: Two times a day (BID) | ORAL | Status: DC
  Administered 2018-12-21 – 2019-01-13 (×46): 250 mg via ORAL
  Filled 2018-12-21 (×52): qty 1

## 2018-12-21 MED ORDER — ADULT MULTIVITAMIN W/MINERALS CH
1.0000 | ORAL_TABLET | Freq: Every day | ORAL | Status: DC
  Administered 2018-12-21 – 2019-01-13 (×24): 1 via ORAL
  Filled 2018-12-21 (×24): qty 1

## 2018-12-21 MED ORDER — INSULIN ASPART 100 UNIT/ML ~~LOC~~ SOLN
0.0000 [IU] | Freq: Every day | SUBCUTANEOUS | Status: DC
  Administered 2018-12-22: 2 [IU] via SUBCUTANEOUS
  Administered 2018-12-26 – 2018-12-27 (×2): 3 [IU] via SUBCUTANEOUS
  Administered 2018-12-28: 4 [IU] via SUBCUTANEOUS

## 2018-12-21 NOTE — Progress Notes (Signed)
Pt can not maintain O2 sats above 85%. Oxygen sensor was changed and oxygen increased to 10L. Pt was repositioned in bed and placed in high fowlers. He is in no distress, sitting in bed watching "Youtube." Respiratory was called. They discussed proning with patient, but he refused stating he absolutely could not do that. Respiratory placing patient on heated high flow. Will continue to assess.

## 2018-12-21 NOTE — Progress Notes (Signed)
NAME:  Joel Greene, MRN:  [id number], DOB:  1951/04/14, LOS: 3 ADMISSION DATE:  12/18/2018, CONSULTATION DATE:  12/19/2018 REFERRING MD:  [facility] - Memon, CHIEF COMPLAINT:  Acute respiratory failure with COVID   Brief History   68 year old male with history of bronchitis presenting with COVID related acute respiratory failure requiring intubation in [place] and [facility].  Patient had history of cough, fever and chills and presented with acute hypoxemia failing to respond to HFNC and required intubation.    Past Medical History  Bronchitis and IBS not on immune modulators  Significant Hospital Events   6/20 intubation for COVID associated hypoxemic respiratory failure  Consults:  PCCM  Procedures:  N/A  Significant Diagnostic Tests:  CXR 6/21 that I reviewed myself, R>L airspace disease  Micro Data:  6/20 COVID +++  Antimicrobials:  "Remdesivir" 6/20 Rocephin 6/21>>> Zithromax 6/21>>>  Interim history/subjective:  Successfully extubated yesterday. Reports ongoing dyspnea, but improving.  Objective   Blood pressure (!) 161/84, pulse 100, temperature 98 F (36.7 C), temperature source Axillary, resp. rate (!) 31, height 5\' 7"  (1.702 m), weight 75.2 kg, SpO2 94 %.    FiO2 (%):  [40 %-70 %] 40 %   Intake/Output Summary (Last 24 hours) at 12/21/2018 1022 Last data filed at 12/21/2018 1000 Gross per 24 hour  Intake 2598.9 ml  Output 2805 ml  Net -206.1 ml   Filed Weights   12/18/18 1307 12/19/18 0510 12/20/18 0445  Weight: 79.4 kg 75.8 kg 75.2 kg    Examination: General: average build, no distress. HENT: no ulceration, moist mucous membranes. Lungs: Crackles bilaterally. Cardiovascular: RRR, Nl S1/S2 and -M/R/G Abdomen: Soft, NT, ND and +BS Extremities: -edema and -tenderness Neuro: Awake and following commands with normal strength. Skin: Intact  Resolved Hospital Problem list   N/A  Assessment & Plan:  Was critically ill due to  acute hypoxic respiratory failure requiring mechanical ventilation Weaning oxygen requirements.  Progressive ambulation.  COVID-19 related pneumonia. Normal WBC and borderline PCT. No clear evidence of bacterial pneumonia. Discontinue antibiotics.  Transfer to PCU.  Labs   CBC: Recent Labs  Lab 12/18/18 1333 12/19/18 0555 12/20/18 0120 12/20/18 0517 12/21/18 0200  WBC 6.9  --  10.0  --  8.4  NEUTROABS 6.4  --  9.5*  --  7.8*  HGB 14.0 11.2* 12.1* 11.2* 11.4*  HCT 38.5* 33.0* 34.6* 33.0* 33.0*  MCV 91.9  --  94.8  --  95.1  PLT 304  --  310  --  784    Basic Metabolic Panel: Recent Labs  Lab 12/18/18 1333 12/19/18 0555 12/19/18 0729 12/20/18 0120 12/20/18 0517 12/21/18 0200  NA 132* 139 139 142 141 144  K 2.8* 2.5* 2.7* 3.2* 3.1* 3.1*  CL 90*  --  97* 103  --  106  CO2 31  --  29 29  --  29  GLUCOSE 151*  --  197* 202*  --  221*  BUN 19  --  16 31*  --  29*  CREATININE 0.91  --  0.70 0.74  --  0.88  CALCIUM 8.6*  --  8.2* 8.3*  --  8.1*  MG  --   --  2.5* 2.5*  --  2.2  PHOS  --   --   --  2.9  --  2.2*   GFR: Estimated Creatinine Clearance: 74.1 mL/min (by C-G formula based on SCr of 0.88 mg/dL). Recent Labs  Lab 12/18/18  1333 12/18/18 1600 12/19/18 0729 12/20/18 0120 12/21/18 0200  PROCALCITON 1.24  --  1.15 0.90 0.58  WBC 6.9  --   --  10.0 8.4  LATICACIDVEN 3.5* 2.3*  --   --   --     Liver Function Tests: Recent Labs  Lab 12/18/18 1333 12/19/18 0729 12/20/18 0120 12/21/18 0200  AST 110* 63* 67* 80*  ALT 119* 86* 86* 107*  ALKPHOS 108 89 87 90  BILITOT 1.2 0.5 0.6 0.4  PROT 6.7 6.0* 5.8* 5.2*  ALBUMIN 2.7* 2.4* 2.2* 2.0*   Recent Labs  Lab 12/18/18 1333  LIPASE 51   No results for input(s): AMMONIA in the last 168 hours.  ABG    Component Value Date/Time   PHART 7.464 (H) 12/20/2018 0517   PCO2ART 42.0 12/20/2018 0517   PO2ART 75.0 (L) 12/20/2018 0517   HCO3 30.3 (H) 12/20/2018 0517   TCO2 32 12/20/2018 0517   O2SAT 96.0  12/20/2018 0517     Coagulation Profile: No results for input(s): INR, PROTIME in the last 168 hours.  Cardiac Enzymes: Recent Labs  Lab 12/18/18 1333 12/20/18 0120 12/21/18 0200  CKTOTAL 35* 14* 14*  TROPONINI 0.03*  --   --     HbA1C: No results found for: HGBA1C  CBG: Recent Labs  Lab 12/19/18 1939 12/20/18 0330 12/20/18 0736 12/20/18 2331 12/21/18 [place], MD [facility] ICU Physician [place]  Pager: [phone] Mobile: [phone] After hours: [phone].

## 2018-12-21 NOTE — Progress Notes (Signed)
Patient transferred to PCU. SR/5L. Report given to Shanon Brow, RN. Belongings taken with patient. Family updated.

## 2018-12-21 NOTE — Progress Notes (Signed)
Pt refused his bath stating it was too uncomfortable to his stomach to be touched or moved. Also did not want sheets changed. He did have his CHG earlier in the shift.

## 2018-12-21 NOTE — Progress Notes (Signed)
Pt has accidentally removed NG tube. His stomach is not as distended and significantly softer than before. Pt is asking that it be left out.

## 2018-12-21 NOTE — Progress Notes (Signed)
Pt taken off the heated HFNC and placed on 10L [place] HFNC. Pt denies SOB, no icnreased WOB, VS within normal limits.

## 2018-12-21 NOTE — Progress Notes (Signed)
PROGRESS NOTE    Joel Greene  BOF:[id number] DOB: 1950-12-16 DOA: 12/18/2018 PCP: Redmond School, MD    Brief Narrative:  68 year old male was initially evaluated at a [facility] hospital and transferred to [place] when he tested positive for COVID-19.  He had presented with fevers, chills, shortness of breath.  He became progressively hypoxic and tachypneic.  He was intubated in the emergency room prior to transfer to [facility].  Currently on steroids, remdesivir and antibiotics.  Patient was able to extubate on 6/22.  He has been transitioned to progressive care.   Assessment & Plan:   Active Problems:   Acute respiratory failure with hypoxia (HCC)   Sepsis due to undetermined organism ([place])   Acute respiratory disease due to COVID-19 virus   Respiratory failure with hypoxia (HCC)   Hypokalemia   1. Acute respiratory failure with hypoxia.  Secondary to COVID-19 pneumonia.  Patient was intubated in the emergency room but was able to extubate on 6/22.  Currently on high flow nasal cannula.  We will try to wean down oxygen as tolerated.  He is also being treated with Remdesivir and intravenous steroids. 2. Sepsis secondary to COVID-19.  Blood cultures have shown no growth thus far.  Procalcitonin was marginally elevated.  Lactic acid was initially elevated, but has trended down.  Clinically he is improved.  Discontinue further antibiotics 3. Hypokalemia.  Replace.  Magnesium is normal.   DVT prophylaxis: Lovenox Code Status: Full code Family Communication: updated significant other on 6/23 Disposition Plan: Transfer to progressive care   Consultants:   PCCM  Procedures:   Intubation 6/20 > 6/22  Antimicrobials:   Azithromycin 6/21 > 6/23  Ceftriaxone 6/21 > 6/23  Remdesivir 6/20 >   Subjective: Had some shortness of breath overnight and briefly required heated high flow.  He has been transitioned back to high flow nasal cannula and appears to be  breathing comfortably.  Objective: Vitals:   12/21/18 1120 12/21/18 1200 12/21/18 1300 12/21/18 1400  BP: (!) 169/72 (!) 151/79 (!) 150/23 (!) 155/86  Pulse: 97 93 (!) 106 88  Resp: 17 (!) 22 (!) 30 (!) 33  Temp:  98.3 F (36.8 C)    TempSrc:  Oral    SpO2: 94% 91% 92% 93%  Weight:      Height:        Intake/Output Summary (Last 24 hours) at 12/21/2018 1618 Last data filed at 12/21/2018 1410 Gross per 24 hour  Intake 1417.77 ml  Output 1305 ml  Net 112.77 ml   Filed Weights   12/18/18 1307 12/19/18 0510 12/20/18 0445  Weight: 79.4 kg 75.8 kg 75.2 kg    Examination:  General exam: Alert, awake, oriented x 3 Respiratory system: Clear to auscultation. Respiratory effort normal. Cardiovascular system:RRR. No murmurs, rubs, gallops. Gastrointestinal system: Abdomen is nondistended, soft and nontender. No organomegaly or masses felt. Normal bowel sounds heard. Central nervous system: Alert and oriented. No focal neurological deficits. Extremities: No C/C/E, +pedal pulses Skin: No rashes, lesions or ulcers Psychiatry: Judgement and insight appear normal. Mood & affect appropriate.       Data Reviewed: I have personally reviewed following labs and imaging studies  CBC: Recent Labs  Lab 12/18/18 1333 12/19/18 0555 12/20/18 0120 12/20/18 0517 12/21/18 0200  WBC 6.9  --  10.0  --  8.4  NEUTROABS 6.4  --  9.5*  --  7.8*  HGB 14.0 11.2* 12.1* 11.2* 11.4*  HCT 38.5* 33.0* 34.6* 33.0* 33.0*  MCV 91.9  --  94.8  --  95.1  PLT 304  --  310  --  673   Basic Metabolic Panel: Recent Labs  Lab 12/18/18 1333 12/19/18 0555 12/19/18 0729 12/20/18 0120 12/20/18 0517 12/21/18 0200  NA 132* 139 139 142 141 144  K 2.8* 2.5* 2.7* 3.2* 3.1* 3.1*  CL 90*  --  97* 103  --  106  CO2 31  --  29 29  --  29  GLUCOSE 151*  --  197* 202*  --  221*  BUN 19  --  16 31*  --  29*  CREATININE 0.91  --  0.70 0.74  --  0.88  CALCIUM 8.6*  --  8.2* 8.3*  --  8.1*  MG  --   --  2.5* 2.5*   --  2.2  PHOS  --   --   --  2.9  --  2.2*   GFR: Estimated Creatinine Clearance: 74.1 mL/min (by C-G formula based on SCr of 0.88 mg/dL). Liver Function Tests: Recent Labs  Lab 12/18/18 1333 12/19/18 0729 12/20/18 0120 12/21/18 0200  AST 110* 63* 67* 80*  ALT 119* 86* 86* 107*  ALKPHOS 108 89 87 90  BILITOT 1.2 0.5 0.6 0.4  PROT 6.7 6.0* 5.8* 5.2*  ALBUMIN 2.7* 2.4* 2.2* 2.0*   Recent Labs  Lab 12/18/18 1333  LIPASE 51   No results for input(s): AMMONIA in the last 168 hours. Coagulation Profile: No results for input(s): INR, PROTIME in the last 168 hours. Cardiac Enzymes: Recent Labs  Lab 12/18/18 1333 12/20/18 0120 12/21/18 0200  CKTOTAL 35* 14* 14*  TROPONINI 0.03*  --   --    BNP (last 3 results) No results for input(s): PROBNP in the last 8760 hours. HbA1C: Recent Labs    12/21/18 0200  HGBA1C 6.5*   CBG: Recent Labs  Lab 12/20/18 0736 12/20/18 1927 12/20/18 2331 12/21/18 0737 12/21/18 1114  GLUCAP 195* 204* 254* 208* 248*   Lipid Profile: No results for input(s): CHOL, HDL, LDLCALC, TRIG, CHOLHDL, LDLDIRECT in the last 72 hours. Thyroid Function Tests: No results for input(s): TSH, T4TOTAL, FREET4, T3FREE, THYROIDAB in the last 72 hours. Anemia Panel: Recent Labs    12/20/18 0120 12/21/18 0200  FERRITIN 2,772* 1,831*   Sepsis Labs: Recent Labs  Lab 12/18/18 1333 12/18/18 1600 12/19/18 0729 12/20/18 0120 12/21/18 0200  PROCALCITON 1.24  --  1.15 0.90 0.58  LATICACIDVEN 3.5* 2.3*  --   --   --     Recent Results (from the past 240 hour(s))  SARS Coronavirus 2 (CEPHEID - Performed in [place] hospital lab), Hosp Order     Status: Abnormal   Collection Time: 12/18/18  1:13 PM   Specimen: Nasopharyngeal Swab  Result Value Ref Range Status   SARS Coronavirus 2 POSITIVE (A) NEGATIVE Final    Comment: CRITICAL RESULT CALLED TO, READ BACK BY AND VERIFIED WITH: A. TUTTLE @1426   12/18/18 KAY (NOTE) If result is NEGATIVE SARS-CoV-2  target nucleic acids are NOT DETECTED. The SARS-CoV-2 RNA is generally detectable in upper and lower  respiratory specimens during the acute phase of infection. The lowest  concentration of SARS-CoV-2 viral copies this assay can detect is 250  copies / mL. A negative result does not preclude SARS-CoV-2 infection  and should not be used as the sole basis for treatment or other  patient management decisions.  A negative result may occur with  improper specimen collection / handling,  submission of specimen other  than nasopharyngeal swab, presence of viral mutation(s) within the  areas targeted by this assay, and inadequate number of viral copies  (<250 copies / mL). A negative result must be combined with clinical  observations, patient history, and epidemiological information. If result is POSITIVE SARS-CoV-2 target nucleic acids are DETECTED.  The SARS-CoV-2 RNA is generally detectable in upper and lower  respiratory specimens during the acute phase of infection.  Positive  results are indicative of active infection with SARS-CoV-2.  Clinical  correlation with patient history and other diagnostic information is  necessary to determine patient infection status.  Positive results do  not rule out bacterial infection or co-infection with other viruses. If result is PRESUMPTIVE POSTIVE SARS-CoV-2 nucleic acids MAY BE PRESENT.   A presumptive positive result was obtained on the submitted specimen  and confirmed on repeat testing.  While 2019 novel coronavirus  (SARS-CoV-2) nucleic acids may be present in the submitted sample  additional confirmatory testing may be necessary for epidemiological  and / or clinical management purposes  to differentiate between  SARS-CoV-2 and other Sarbecovirus currently known to infect humans.  If clinically indicated additional testing with an alternate test  methodology [id number])  is advised. The SARS-CoV-2 RNA is generally  detectable in upper and lower  respiratory specimens during the acute  phase of infection. The expected result is Negative. Fact Sheet for Patients:  StrictlyIdeas.no Fact Sheet for Healthcare Providers: BankingDealers.co.za This test is not yet approved or cleared by the [place] FDA and has been authorized for detection and/or diagnosis of SARS-CoV-2 by FDA under an Emergency Use Authorization (EUA).  This EUA will remain in effect (meaning this test can be used) for the duration of the COVID-19 declaration under Section 564(b)(1) of the Act, 21 U.S.C. section 360bbb-3(b)(1), unless the authorization is terminated or revoked sooner. Performed at [facility], [address] [place], [place] [address]   Blood Culture (routine x 2)     Status: None (Preliminary result)   Collection Time: 12/18/18  1:44 PM   Specimen: Left Antecubital; Blood  Result Value Ref Range Status   Specimen Description   Final    LEFT ANTECUBITAL BOTTLES DRAWN AEROBIC AND ANAEROBIC   Special Requests Blood Culture adequate volume  Final   Culture   Final    NO GROWTH 3 DAYS Performed at [facility], [address], [place], [place] [address]    Report Status PENDING  Incomplete  Blood Culture (routine x 2)     Status: None (Preliminary result)   Collection Time: 12/18/18  1:45 PM   Specimen: BLOOD RIGHT WRIST  Result Value Ref Range Status   Specimen Description   Final    BLOOD RIGHT WRIST BOTTLES DRAWN AEROBIC AND ANAEROBIC   Special Requests Blood Culture adequate volume  Final   Culture   Final    NO GROWTH 3 DAYS Performed at [facility], [address], [place], [place] [address]    Report Status PENDING  Incomplete  MRSA PCR Screening     Status: None   Collection Time: 12/19/18  5:24 AM   Specimen: Nasal Mucosa; Nasopharyngeal  Result Value Ref Range Status   MRSA by PCR NEGATIVE NEGATIVE Final    Comment:        The GeneXpert MRSA Assay (FDA approved for NASAL  specimens only), is one component of a comprehensive MRSA colonization surveillance program. It is not intended to diagnose MRSA infection nor to guide or  monitor treatment for MRSA infections. Performed at [facility], [place] [address] [place], [place] [address]   C difficile quick scan w PCR reflex     Status: None   Collection Time: 12/20/18 10:02 PM   Specimen: STOOL  Result Value Ref Range Status   C Diff antigen NEGATIVE NEGATIVE Final   C Diff toxin NEGATIVE NEGATIVE Final   C Diff interpretation No C. difficile detected.  Final    Comment: Performed at [facility], [place] [address] [place], [place] [address]         Radiology Studies: Dg Abd 1 View  Result Date: 12/20/2018 CLINICAL DATA:  68 year old male with abdominal distention and pain. EXAM: ABDOMEN - 1 VIEW COMPARISON:  Abdominal radiograph dated 06/11/2016 FINDINGS: There is severe gaseous distention of the stomach. This may represent gastric dysmotility or gastroparesis although a gastric outlet obstruction is not entirely excluded. CT may provide better evaluation. Air is noted within the colon. No free air identified. There is no dilatation of the small bowel. The osseous structures and soft tissues are grossly unremarkable. IMPRESSION: Severe gaseous distention of the stomach. Electronically Signed   By: Anner Crete M.D.   On: 12/20/2018 22:23   Dg Chest Port 1 View  Result Date: 12/20/2018 CLINICAL DATA:  Intubation. EXAM: PORTABLE CHEST 1 VIEW COMPARISON:  12/19/2018. FINDINGS: Right PICC line noted with tip over superior vena cava. Endotracheal tube, NG tube in stable position. Heart size stable. Interim improvement and bilateral pulmonary infiltrates with mild residual interstitial prominence. No pleural effusion or pneumothorax. IMPRESSION: 1. Right PICC line noted with tip over superior vena cava. Endotracheal tube and NG tube stable position. 2. Improvement of  bilateral pulmonary infiltrates with mild residual interstitial prominence. Electronically Signed   By: [place]   On: 12/20/2018 06:16        Scheduled Meds: . aspirin  81 mg Oral Daily  . atorvastatin  20 mg Oral Daily  . busPIRone  10 mg Oral BID  . Chlorhexidine Gluconate Cloth  6 each Topical Daily  . cholecalciferol  5,000 Units Oral Daily  . enoxaparin (LOVENOX) injection  40 mg Subcutaneous Q12H  . feeding supplement (ENSURE ENLIVE)  237 mL Oral TID BM  . fluticasone  2 spray Each Nare Daily  . gabapentin  300 mg Oral 2 times per day   And  . gabapentin  600 mg Oral QHS  . insulin aspart  0-20 Units Subcutaneous TID WC  . insulin aspart  0-5 Units Subcutaneous QHS  . latanoprost  1 drop Both Eyes QHS  . mouth rinse  15 mL Mouth Rinse BID  . methylPREDNISolone (SOLU-MEDROL) injection  40 mg Intravenous Q12H  . multivitamin with minerals  1 tablet Oral Daily  . pantoprazole  40 mg Oral Daily  . saccharomyces boulardii  250 mg Oral BID  . sodium chloride flush  10-40 mL Intracatheter Q12H  . tamsulosin  0.4 mg Oral QPC breakfast  . venlafaxine XR  75 mg Oral Q breakfast   Continuous Infusions: . remdesivir 100 mg in NS 250 mL Stopped (12/21/18 0837)     LOS: 3 days    Critical care Time spent: 3mins    Kathie Dike, MD [facility]   If 7PM-7AM, please contact night-coverage [email or web address]  12/21/2018, 4:18 PM

## 2018-12-21 NOTE — Progress Notes (Addendum)
Pt continues to be moan loudly in pain despite placement of NG tube. Pt states that initially the pain was better but now his pain level is back at a 10. Paged MD with request for pain meds.   **Stool tested negative for C-diff**

## 2018-12-21 NOTE — Progress Notes (Signed)
Patient oxygen saturation while getting up to bed side commode does drop to 60's, and requires 15 liters high flow nasal cannula to get oxygen up.  When patient recovers and is in bed, he goes back to only needing 5 liters.

## 2018-12-22 LAB — COMPREHENSIVE METABOLIC PANEL
ALT: 79 U/L — ABNORMAL HIGH (ref 0–44)
AST: 38 U/L (ref 15–41)
Albumin: 2 g/dL — ABNORMAL LOW (ref 3.5–5.0)
Alkaline Phosphatase: 78 U/L (ref 38–126)
Anion gap: 10 (ref 5–15)
BUN: 20 mg/dL (ref 8–23)
CO2: 27 mmol/L (ref 22–32)
Calcium: 7.8 mg/dL — ABNORMAL LOW (ref 8.9–10.3)
Chloride: 106 mmol/L (ref 98–111)
Creatinine, Ser: 0.8 mg/dL (ref 0.61–1.24)
GFR calc Af Amer: >60 mL/min (ref >60)
GFR calc non Af Amer: >60 mL/min (ref >60)
Glucose, Bld: 174 mg/dL — ABNORMAL HIGH (ref 70–99)
Potassium: 4.4 mmol/L (ref 3.5–5.1)
Sodium: 143 mmol/L (ref 135–145)
Total Bilirubin: 0.8 mg/dL (ref 0.3–1.2)
Total Protein: 5.2 g/dL — ABNORMAL LOW (ref 6.5–8.1)

## 2018-12-22 LAB — CBC WITH DIFFERENTIAL/PLATELET
Abs Immature Granulocytes: 0.15 10*3/uL — ABNORMAL HIGH (ref 0.00–0.07)
Basophils Absolute: 0 10*3/uL (ref 0.0–0.1)
Basophils Relative: 0 %
Eosinophils Absolute: 0 10*3/uL (ref 0.0–0.5)
Eosinophils Relative: 0 %
HCT: 32.9 % — ABNORMAL LOW (ref 39.0–52.0)
Hemoglobin: 11.3 g/dL — ABNORMAL LOW (ref 13.0–17.0)
Immature Granulocytes: 2 %
Lymphocytes Relative: 3 %
Lymphs Abs: 0.2 10*3/uL — ABNORMAL LOW (ref 0.7–4.0)
MCH: 33 pg (ref 26.0–34.0)
MCHC: 34.3 g/dL (ref 30.0–36.0)
MCV: 96.2 fL (ref 80.0–100.0)
Monocytes Absolute: 0.2 10*3/uL (ref 0.1–1.0)
Monocytes Relative: 2 %
Neutro Abs: 7.9 10*3/uL — ABNORMAL HIGH (ref 1.7–7.7)
Neutrophils Relative %: 93 %
Platelets: 336 10*3/uL (ref 150–400)
RBC: 3.42 MIL/uL — ABNORMAL LOW (ref 4.22–5.81)
RDW: 13 % (ref 11.5–15.5)
WBC: 8.4 10*3/uL (ref 4.0–10.5)
nRBC: 0 % (ref 0.0–0.2)

## 2018-12-22 LAB — CK: Total CK: 15 U/L — ABNORMAL LOW (ref 49–397)

## 2018-12-22 LAB — D-DIMER, QUANTITATIVE: D-Dimer, Quant: 5.65 ug/mL-FEU — ABNORMAL HIGH (ref 0.00–0.50)

## 2018-12-22 LAB — C-REACTIVE PROTEIN: CRP: 8.4 mg/dL — ABNORMAL HIGH (ref <1.0)

## 2018-12-22 LAB — MAGNESIUM: Magnesium: 1.9 mg/dL (ref 1.7–2.4)

## 2018-12-22 LAB — GLUCOSE, CAPILLARY
Glucose-Capillary: 159 mg/dL — ABNORMAL HIGH (ref 70–99)
Glucose-Capillary: 253 mg/dL — ABNORMAL HIGH (ref 70–99)
Glucose-Capillary: 229 mg/dL — ABNORMAL HIGH (ref 70–99)
Glucose-Capillary: 202 mg/dL — ABNORMAL HIGH (ref 70–99)

## 2018-12-22 LAB — FERRITIN: Ferritin: 1097 ng/mL — ABNORMAL HIGH (ref 24–336)

## 2018-12-22 LAB — PHOSPHORUS: Phosphorus: 4.3 mg/dL (ref 2.5–4.6)

## 2018-12-22 MED ORDER — HYDRALAZINE HCL 20 MG/ML IJ SOLN
10.0000 mg | Freq: Four times a day (QID) | INTRAMUSCULAR | Status: DC | PRN
  Administered 2018-12-22 – 2019-01-06 (×6): 10 mg via INTRAVENOUS
  Filled 2018-12-22 (×6): qty 1

## 2018-12-22 MED ORDER — HYDROCODONE-ACETAMINOPHEN 5-325 MG PO TABS
1.0000 | ORAL_TABLET | Freq: Four times a day (QID) | ORAL | Status: DC | PRN
  Administered 2018-12-22 – 2019-01-09 (×19): 1 via ORAL
  Filled 2018-12-22 (×20): qty 1

## 2018-12-22 NOTE — Plan of Care (Signed)
Patient stable, discussed POC with patient, agreeable with plan, denies question/concerns at this time.  Problem: Clinical Measurements: Goal: Ability to maintain clinical measurements within normal limits will improve Outcome: Progressing Pt OOB to chair tolerated well x2hrs. Pt return to bed in prone position, tolerated well x 1hr r/t LBP. Problem: Clinical Measurements: Goal: Will remain Joel Greene from infection Outcome: Progressing  Afebrile this shift. Problem: Clinical Measurements: Goal: Respiratory complications will improve Outcome: Progressing  Pt unable to tolerate weaning 02. Problem: Nutrition: Goal: Adequate nutrition will be maintained Outcome: Progressing  PO regular diet tolerated well. Problem: Pain Managment: Goal: General experience of comfort will improve Outcome: Progressing  Pt c/o of HA 7/10, PO PRN rx tolerated well.

## 2018-12-22 NOTE — Care Management Important Message (Signed)
Important Message  Patient Details  Name: Joel Greene MRN: [id number] Date of Birth: Nov 25, 1950   Medicare Important Message Given:  Yes - Important Message mailed due to current National Emergency    Verbal consent obtained due to current National Emergency  Relationship to patient: Family Member Contact Name: Tyshawn Ciullo Call Date: 12/22/18  Time: 1106 Phone: [phone] Outcome: Spoke with contact Important Message mailed to: Emergency contact on file     [facility] 12/22/2018, 11:07 AM

## 2018-12-22 NOTE — Progress Notes (Signed)
PROGRESS NOTE    Joel [place]  DPO:[id number] DOB: [birth year] DOA: 12/18/2018 PCP: Redmond School, MD    Brief Narrative:  68 year old male was initially evaluated at a [facility] hospital and transferred to [place] when he tested positive for COVID-19.  He had presented with fevers, chills, shortness of breath.  He became progressively hypoxic and tachypneic.  He was intubated in the emergency room prior to transfer to [facility].  Currently on steroids, remdesivir and antibiotics.  Patient was able to extubate on 6/22.  He has been transitioned to progressive care.   Assessment & Plan:    1. Acute hypoxic respiratory failure due to COVID-19 interstitial pneumonia.  He was started on IV steroids along with IV REMDESIVIR, clinically improved, briefly intubated for 2 days, extubated on 12/20/2018.  Currently on high flow nasal cannula oxygen, continue supportive care, advance activity and titrate down oxygen.  Monitor inflammatory markers.  COVID-19 Labs  Recent Labs    12/20/18 0120 12/21/18 0200 12/22/18 0415  DDIMER 8.42* 6.20* 5.65*  FERRITIN 2,772* 1,831* 1,097*  CRP 18.7* 11.3* 8.4*    Lab Results  Component Value Date   SARSCOV2NAA POSITIVE (A) 12/18/2018      2. Sepsis secondary to COVID-19.  Blood cultures negative stable procalcitonin.  Monitor.   3. Hypokalemia.  Replace.  Magnesium is normal.  4. Eessential hypertension.  Placed on PRN hydralazine.   DVT prophylaxis: Lovenox Code Status: Full code Family Communication: updated significant other on 6/23 Disposition Plan: Transfer to progressive care   Consultants:   PCCM  Procedures:   Intubation 6/20 > 6/22    Remdesivir 6/20 >   Subjective: Had some shortness of breath overnight and briefly required heated high flow.  He has been transitioned back to high flow nasal cannula and appears to be breathing comfortably.  Objective: Vitals:   12/21/18 2024 12/21/18 2357  12/22/18 0612 12/22/18 0736  BP: (!) 162/89 (!) 182/94 (!) 154/65 (!) 155/95  Pulse: 95 80 80 77  Resp: (!) 29 (!) 28 (!) 35 (!) 30  Temp: 99 F (37.2 C) 98 F (36.7 C) 99.4 F (37.4 C)   TempSrc: Oral Oral Oral   SpO2: 95% 92% 93% 92%  Weight:      Height:        Intake/Output Summary (Last 24 hours) at 12/22/2018 1222 Last data filed at 12/22/2018 1214 Gross per 24 hour  Intake 194.82 ml  Output 1750 ml  Net -1555.18 ml   Filed Weights   12/18/18 1307 12/19/18 0510 12/20/18 0445  Weight: 79.4 kg 75.8 kg 75.2 kg    Examination:  Awake Alert, Oriented X 3, No new F.N deficits, Normal affect [place].AT,PERRAL Supple Neck,No JVD, No cervical lymphadenopathy appriciated.  Symmetrical Chest wall movement, Good air movement bilaterally, CTAB RRR,No Gallops, Rubs or new Murmurs, No Parasternal Heave +ve B.Sounds, Abd Soft, No tenderness, No organomegaly appriciated, No rebound - guarding or rigidity. No Cyanosis, Clubbing or edema, No new Rash or bruise   Data Reviewed: I have personally reviewed following labs and imaging studies  CBC: Recent Labs  Lab 12/18/18 1333 12/19/18 0555 12/20/18 0120 12/20/18 0517 12/21/18 0200 12/22/18 0415  WBC 6.9  --  10.0  --  8.4 8.4  NEUTROABS 6.4  --  9.5*  --  7.8* 7.9*  HGB 14.0 11.2* 12.1* 11.2* 11.4* 11.3*  HCT 38.5* 33.0* 34.6* 33.0* 33.0* 32.9*  MCV 91.9  --  94.8  --  95.1 96.2  PLT 304  --  310  --  345 751   Basic Metabolic Panel: Recent Labs  Lab 12/18/18 1333  12/19/18 0729 12/20/18 0120 12/20/18 0517 12/21/18 0200 12/22/18 0415  NA 132*   < > 139 142 141 144 143  K 2.8*   < > 2.7* 3.2* 3.1* 3.1* 4.4  CL 90*  --  97* 103  --  106 106  CO2 31  --  29 29  --  29 27  GLUCOSE 151*  --  197* 202*  --  221* 174*  BUN 19  --  16 31*  --  29* 20  CREATININE 0.91  --  0.70 0.74  --  0.88 0.80  CALCIUM 8.6*  --  8.2* 8.3*  --  8.1* 7.8*  MG  --   --  2.5* 2.5*  --  2.2 1.9  PHOS  --   --   --  2.9  --  2.2* 4.3   < > =  values in this interval not displayed.   GFR: Estimated Creatinine Clearance: 81.5 mL/min (by C-G formula based on SCr of 0.8 mg/dL). Liver Function Tests: Recent Labs  Lab 12/18/18 1333 12/19/18 0729 12/20/18 0120 12/21/18 0200 12/22/18 0415  AST 110* 63* 67* 80* 38  ALT 119* 86* 86* 107* 79*  ALKPHOS 108 89 87 90 78  BILITOT 1.2 0.5 0.6 0.4 0.8  PROT 6.7 6.0* 5.8* 5.2* 5.2*  ALBUMIN 2.7* 2.4* 2.2* 2.0* 2.0*   Recent Labs  Lab 12/18/18 1333  LIPASE 51   No results for input(s): AMMONIA in the last 168 hours. Coagulation Profile: No results for input(s): INR, PROTIME in the last 168 hours. Cardiac Enzymes: Recent Labs  Lab 12/18/18 1333 12/20/18 0120 12/21/18 0200 12/22/18 0415  CKTOTAL 35* 14* 14* 15*  TROPONINI 0.03*  --   --   --    BNP (last 3 results) No results for input(s): PROBNP in the last 8760 hours. HbA1C: Recent Labs    12/21/18 0200  HGBA1C 6.5*   CBG: Recent Labs  Lab 12/21/18 0737 12/21/18 1114 12/21/18 1616 12/21/18 2221 12/22/18 0820  GLUCAP 208* 248* 183* 147* 159*   Lipid Profile: No results for input(s): CHOL, HDL, LDLCALC, TRIG, CHOLHDL, LDLDIRECT in the last 72 hours. Thyroid Function Tests: No results for input(s): TSH, T4TOTAL, FREET4, T3FREE, THYROIDAB in the last 72 hours. Anemia Panel: Recent Labs    12/21/18 0200 12/22/18 0415  FERRITIN 1,831* 1,097*   Sepsis Labs: Recent Labs  Lab 12/18/18 1333 12/18/18 1600 12/19/18 0729 12/20/18 0120 12/21/18 0200  PROCALCITON 1.24  --  1.15 0.90 0.58  LATICACIDVEN 3.5* 2.3*  --   --   --     Recent Results (from the past 240 hour(s))  SARS Coronavirus 2 (CEPHEID - Performed in [place] hospital lab), Hosp Order     Status: Abnormal   Collection Time: 12/18/18  1:13 PM   Specimen: Nasopharyngeal Swab  Result Value Ref Range Status   SARS Coronavirus 2 POSITIVE (A) NEGATIVE Final    Comment: CRITICAL RESULT CALLED TO, READ BACK BY AND VERIFIED WITH: A. TUTTLE @1426    12/18/18 KAY (NOTE) If result is NEGATIVE SARS-CoV-2 target nucleic acids are NOT DETECTED. The SARS-CoV-2 RNA is generally detectable in upper and lower  respiratory specimens during the acute phase of infection. The lowest  concentration of SARS-CoV-2 viral copies this assay can detect is 250  copies / mL. A negative result does not preclude SARS-CoV-2  infection  and should not be used as the sole basis for treatment or other  patient management decisions.  A negative result may occur with  improper specimen collection / handling, submission of specimen other  than nasopharyngeal swab, presence of viral mutation(s) within the  areas targeted by this assay, and inadequate number of viral copies  (<250 copies / mL). A negative result must be combined with clinical  observations, patient history, and epidemiological information. If result is POSITIVE SARS-CoV-2 target nucleic acids are DETECTED.  The SARS-CoV-2 RNA is generally detectable in upper and lower  respiratory specimens during the acute phase of infection.  Positive  results are indicative of active infection with SARS-CoV-2.  Clinical  correlation with patient history and other diagnostic information is  necessary to determine patient infection status.  Positive results do  not rule out bacterial infection or co-infection with other viruses. If result is PRESUMPTIVE POSTIVE SARS-CoV-2 nucleic acids MAY BE PRESENT.   A presumptive positive result was obtained on the submitted specimen  and confirmed on repeat testing.  While 2019 novel coronavirus  (SARS-CoV-2) nucleic acids may be present in the submitted sample  additional confirmatory testing may be necessary for epidemiological  and / or clinical management purposes  to differentiate between  SARS-CoV-2 and other Sarbecovirus currently known to infect humans.  If clinically indicated additional testing with an alternate test  methodology [phone])  is advised. The  SARS-CoV-2 RNA is generally  detectable in upper and lower respiratory specimens during the acute  phase of infection. The expected result is Negative. Fact Sheet for Patients:  StrictlyIdeas.no Fact Sheet for Healthcare Providers: BankingDealers.co.za This test is not yet approved or cleared by the [place] FDA and has been authorized for detection and/or diagnosis of SARS-CoV-2 by FDA under an Emergency Use Authorization (EUA).  This EUA will remain in effect (meaning this test can be used) for the duration of the COVID-19 declaration under Section 564(b)(1) of the Act, 21 U.S.C. section 360bbb-3(b)(1), unless the authorization is terminated or revoked sooner. Performed at [facility], [address], [place], [place] [address]   Blood Culture (routine x 2)     Status: None (Preliminary result)   Collection Time: 12/18/18  1:44 PM   Specimen: Left Antecubital; Blood  Result Value Ref Range Status   Specimen Description   Final    LEFT ANTECUBITAL BOTTLES DRAWN AEROBIC AND ANAEROBIC   Special Requests Blood Culture adequate volume  Final   Culture   Final    NO GROWTH 4 DAYS Performed at [facility], [address] [place], [place] [address]    Report Status PENDING  Incomplete  Blood Culture (routine x 2)     Status: None (Preliminary result)   Collection Time: 12/18/18  1:45 PM   Specimen: BLOOD RIGHT WRIST  Result Value Ref Range Status   Specimen Description   Final    BLOOD RIGHT WRIST BOTTLES DRAWN AEROBIC AND ANAEROBIC   Special Requests Blood Culture adequate volume  Final   Culture   Final    NO GROWTH 4 DAYS Performed at [facility], [address], [place],  [address]    Report Status PENDING  Incomplete  MRSA PCR Screening     Status: None   Collection Time: 12/19/18  5:24 AM   Specimen: Nasal Mucosa; Nasopharyngeal  Result Value Ref Range Status   MRSA by PCR NEGATIVE NEGATIVE Final    Comment:  The GeneXpert MRSA Assay (FDA approved for NASAL specimens only), is one component of a comprehensive MRSA colonization surveillance program. It is not intended to diagnose MRSA infection nor to guide or monitor treatment for MRSA infections. Performed at [facility], [place] [address], [place], [place] [address]   C difficile quick scan w PCR reflex     Status: None   Collection Time: 12/20/18 10:02 PM   Specimen: STOOL  Result Value Ref Range Status   C Diff antigen NEGATIVE NEGATIVE Final   C Diff toxin NEGATIVE NEGATIVE Final   C Diff interpretation No C. difficile detected.  Final    Comment: Performed at [facility], [place] [address] [place], [place] [address]         Radiology Studies: Dg Abd 1 View  Result Date: 12/20/2018 CLINICAL DATA:  68 year old male with abdominal distention and pain. EXAM: ABDOMEN - 1 VIEW COMPARISON:  Abdominal radiograph dated 06/11/2016 FINDINGS: There is severe gaseous distention of the stomach. This may represent gastric dysmotility or gastroparesis although a gastric outlet obstruction is not entirely excluded. CT may provide better evaluation. Air is noted within the colon. No free air identified. There is no dilatation of the small bowel. The osseous structures and soft tissues are grossly unremarkable. IMPRESSION: Severe gaseous distention of the stomach. Electronically Signed   By: Anner Crete M.D.   On: 12/20/2018 22:23     Scheduled Meds: . aspirin  81 mg Oral Daily  . atorvastatin  20 mg Oral Daily  . busPIRone  10 mg Oral BID  . Chlorhexidine Gluconate Cloth  6 each Topical Daily  . cholecalciferol  5,000 Units Oral Daily  . enoxaparin (LOVENOX) injection  40 mg Subcutaneous Q12H  . feeding supplement (ENSURE ENLIVE)  237 mL Oral TID BM  . fluticasone  2 spray Each Nare Daily  . gabapentin  300 mg Oral 2 times per day   And  . gabapentin  600 mg Oral QHS  . insulin aspart  0-20 Units  Subcutaneous TID WC  . insulin aspart  0-5 Units Subcutaneous QHS  . latanoprost  1 drop Both Eyes QHS  . mouth rinse  15 mL Mouth Rinse BID  . methylPREDNISolone (SOLU-MEDROL) injection  40 mg Intravenous Q12H  . multivitamin with minerals  1 tablet Oral Daily  . pantoprazole  40 mg Oral Daily  . saccharomyces boulardii  250 mg Oral BID  . tamsulosin  0.4 mg Oral QPC breakfast  . venlafaxine XR  75 mg Oral Q breakfast   Continuous Infusions: . remdesivir 100 mg in NS 250 mL 100 mg (12/22/18 0954)     LOS: 4 days   Signature  Lala Lund M.D on 12/22/2018 at 12:22 PM   -  To page go to [email or web address]

## 2018-12-22 NOTE — Evaluation (Signed)
Physical Therapy Evaluation Patient Details Name: Joel [place] MRN: [id number] DOB: [birth year] Today's Date: 12/22/2018   History of Present Illness  (P) 68 year old male was initially evaluated at a [place] and transferred to [place] when he tested positive for COVID-19.  He had presented 12/18/18 with fevers, chills, shortness of breath.  He became progressively hypoxic and tachypneic. + COVID 19  He was intubated in the emergency room prior to transfer to [facility].  Currently on steroids, remdesivir and antibiotics.  Patient was able to extubate on 6/22.     Clinical Impression  Pt admitted with above diagnosis. Pt currently with functional limitations due to the deficits listed below (see PT Problem List). Patient significantly limited by dyspnea, incr HR, and decr SaO2 on 5L [place] O2. With transfer to [facility], decr to 72% and required max cues to slowly improve to 88%. Again decreased with transfer off BSC. Max HR 136, RR 42. Pt will benefit from skilled PT to increase their independence and safety with mobility to allow discharge to the venue listed below.       Follow Up Recommendations (P) No PT follow up;Supervision - Intermittent    Equipment Recommendations  (P) None recommended by PT    Recommendations for Other Services (P) OT consult     Precautions / Restrictions Precautions Precautions: (P) Other (comment) Precaution Comments: (P) monitor sats, RR, HR      Mobility  Bed Mobility Overal bed mobility: (P) Modified Independent             General bed mobility comments: (P) HOB elevated, no rail  Transfers Overall transfer level: (P) Needs assistance Equipment used: (P) None Transfers: (P) Stand Pivot Transfers   Stand pivot transfers: (P) Min guard       General transfer comment: (P) bed to BSC; BSC to recliner, no imbalance but felt weak  Ambulation/Gait             General Gait Details: (P) unable due to low SaO2 with  transfers  Stairs            Wheelchair Mobility    Modified Rankin (Stroke Patients Only)       Balance Overall balance assessment: (P) Mild deficits observed, not formally tested                                           Pertinent Vitals/Pain Pain Assessment: (P) No/denies pain    Home Living Family/patient expects to be discharged to:: (P) Private residence Living Arrangements: (P) Alone Available Help at Discharge: (P) Friend(s)(girlfriend) Type of Home: (P) House Home Access: (P) Stairs to enter Entrance Stairs-Rails: (P) Right Entrance Stairs-Number of Steps: (P) 3 Home Layout: (P) One level Home Equipment: (P) Grab bars - tub/shower      Prior Function Level of Independence: (P) Independent               Hand Dominance        Extremity/Trunk Assessment   Upper Extremity Assessment Upper Extremity Assessment: (P) Overall WFL for tasks assessed    Lower Extremity Assessment Lower Extremity Assessment: (P) Overall WFL for tasks assessed    Cervical / Trunk Assessment Cervical / Trunk Assessment: (P) Normal  Communication   Communication: (P) Other (comment)(required cues to stop talking and breath for SaO2)  Cognition Arousal/Alertness: (P) Awake/alert Behavior During Therapy: (P)  WFL for tasks assessed/performed Overall Cognitive Status: (P) Within Functional Limits for tasks assessed                                        General Comments General comments (skin integrity, edema, etc.): (P) Educated on pursed lip breathing, sound monitor makes when SaO2 low, need to minimize talking while trying to recover from low SaO2    Exercises     Assessment/Plan    PT Assessment (P) Patient needs continued PT services  PT Problem List (P) Decreased activity tolerance;Decreased balance;Decreased mobility;Decreased knowledge of precautions;Cardiopulmonary status limiting activity       PT Treatment  Interventions (P) Gait training;Functional mobility training;Therapeutic activities;Therapeutic exercise;Balance training;Patient/family education    PT Goals (Current goals can be found in the Care Plan section)  Acute Rehab PT Goals Patient Stated Goal: (P) go home soon PT Goal Formulation: (P) With patient Time For Goal Achievement: (P) 01/05/19 Potential to Achieve Goals: (P) Good    Frequency (P) Min 3X/week   Barriers to discharge        Co-evaluation               AM-PAC PT "6 Clicks" Mobility  Outcome Measure Help needed turning from your back to your side while in a flat bed without using bedrails?: (P) None Help needed moving from lying on your back to sitting on the side of a flat bed without using bedrails?: (P) None Help needed moving to and from a bed to a chair (including a wheelchair)?: (P) A Little Help needed standing up from a chair using your arms (e.g., wheelchair or bedside chair)?: (P) A Little Help needed to walk in hospital room?: (P) Total Help needed climbing 3-5 steps with a railing? : (P) Total 6 Click Score: (P) 16    End of Session Equipment Utilized During Treatment: (P) Oxygen Activity Tolerance: (P) Treatment limited secondary to medical complications (Comment)(SaO2 low, RR and HR high) Patient left: (P) in chair Nurse Communication: (P) Mobility status;Other (comment)(SaO2) PT Visit Diagnosis: (P) Difficulty in walking, not elsewhere classified (R26.2)    Time:  -      Charges:   12/22/18 1147  PT Time Calculation  PT Start Time (ACUTE ONLY) 1145  PT Stop Time (ACUTE ONLY) 1228  PT Time Calculation (min) (ACUTE ONLY) 43 min  PT General Charges  $$ ACUTE PT VISIT 1 Visit  PT Evaluation  $PT Eval Low Complexity 1 Low  PT Treatments  $Therapeutic Activity 8-22 mins  $Self Care/Home Management 8-22                 [employer], PT 12/22/2018, 4:22 PM

## 2018-12-23 LAB — CULTURE, BLOOD (ROUTINE X 2)
Culture: NO GROWTH
Culture: NO GROWTH
Special Requests: ADEQUATE
Special Requests: ADEQUATE

## 2018-12-23 LAB — FERRITIN: Ferritin: 989 ng/mL — ABNORMAL HIGH (ref 24–336)

## 2018-12-23 LAB — COMPREHENSIVE METABOLIC PANEL
ALT: 67 U/L — ABNORMAL HIGH (ref 0–44)
AST: 33 U/L (ref 15–41)
Albumin: 2 g/dL — ABNORMAL LOW (ref 3.5–5.0)
Alkaline Phosphatase: 83 U/L (ref 38–126)
Anion gap: 7 (ref 5–15)
BUN: 20 mg/dL (ref 8–23)
CO2: 27 mmol/L (ref 22–32)
Calcium: 7.7 mg/dL — ABNORMAL LOW (ref 8.9–10.3)
Chloride: 105 mmol/L (ref 98–111)
Creatinine, Ser: 0.71 mg/dL (ref 0.61–1.24)
GFR calc Af Amer: >60 mL/min (ref >60)
GFR calc non Af Amer: >60 mL/min (ref >60)
Glucose, Bld: 168 mg/dL — ABNORMAL HIGH (ref 70–99)
Potassium: 3.7 mmol/L (ref 3.5–5.1)
Sodium: 139 mmol/L (ref 135–145)
Total Bilirubin: 0.7 mg/dL (ref 0.3–1.2)
Total Protein: 5.2 g/dL — ABNORMAL LOW (ref 6.5–8.1)

## 2018-12-23 LAB — CBC WITH DIFFERENTIAL/PLATELET
Abs Immature Granulocytes: 0.15 10*3/uL — ABNORMAL HIGH (ref 0.00–0.07)
Basophils Absolute: 0 10*3/uL (ref 0.0–0.1)
Basophils Relative: 0 %
Eosinophils Absolute: 0 10*3/uL (ref 0.0–0.5)
Eosinophils Relative: 0 %
HCT: 34.1 % — ABNORMAL LOW (ref 39.0–52.0)
Hemoglobin: 11.9 g/dL — ABNORMAL LOW (ref 13.0–17.0)
Immature Granulocytes: 2 %
Lymphocytes Relative: 2 %
Lymphs Abs: 0.2 10*3/uL — ABNORMAL LOW (ref 0.7–4.0)
MCH: 33.1 pg (ref 26.0–34.0)
MCHC: 34.9 g/dL (ref 30.0–36.0)
MCV: 94.7 fL (ref 80.0–100.0)
Monocytes Absolute: 0.2 10*3/uL (ref 0.1–1.0)
Monocytes Relative: 2 %
Neutro Abs: 8.3 10*3/uL — ABNORMAL HIGH (ref 1.7–7.7)
Neutrophils Relative %: 94 %
Platelets: 302 10*3/uL (ref 150–400)
RBC: 3.6 MIL/uL — ABNORMAL LOW (ref 4.22–5.81)
RDW: 12.7 % (ref 11.5–15.5)
WBC: 8.9 10*3/uL (ref 4.0–10.5)
nRBC: 0 % (ref 0.0–0.2)

## 2018-12-23 LAB — BRAIN NATRIURETIC PEPTIDE: B Natriuretic Peptide: 288.5 pg/mL — ABNORMAL HIGH (ref 0.0–100.0)

## 2018-12-23 LAB — LACTATE DEHYDROGENASE: LDH: 369 U/L — ABNORMAL HIGH (ref 98–192)

## 2018-12-23 LAB — GLUCOSE, CAPILLARY
Glucose-Capillary: 165 mg/dL — ABNORMAL HIGH (ref 70–99)
Glucose-Capillary: 134 mg/dL — ABNORMAL HIGH (ref 70–99)
Glucose-Capillary: 189 mg/dL — ABNORMAL HIGH (ref 70–99)
Glucose-Capillary: 152 mg/dL — ABNORMAL HIGH (ref 70–99)

## 2018-12-23 LAB — C-REACTIVE PROTEIN: CRP: 7.8 mg/dL — ABNORMAL HIGH (ref <1.0)

## 2018-12-23 LAB — D-DIMER, QUANTITATIVE: D-Dimer, Quant: 3.44 ug/mL-FEU — ABNORMAL HIGH (ref 0.00–0.50)

## 2018-12-23 LAB — MAGNESIUM: Magnesium: 1.9 mg/dL (ref 1.7–2.4)

## 2018-12-23 MED ORDER — ZOLPIDEM TARTRATE 5 MG PO TABS
5.0000 mg | ORAL_TABLET | Freq: Every evening | ORAL | Status: DC | PRN
  Administered 2018-12-24 – 2019-01-12 (×15): 5 mg via ORAL
  Filled 2018-12-23 (×15): qty 1

## 2018-12-23 NOTE — Progress Notes (Signed)
PROGRESS NOTE    Joel Greene  TLX:[id number] DOB: Aug 25, 1950 DOA: 12/18/2018 PCP: Redmond School, MD    Brief Narrative:  68 year old male was initially evaluated at a [facility] hospital and transferred to [place] when he tested positive for COVID-19.  He had presented with fevers, chills, shortness of breath.  He became progressively hypoxic and tachypneic.  He was intubated in the emergency room prior to transfer to [facility].  Currently on steroids, remdesivir and antibiotics.  Patient was able to extubate on 6/22.  He has been transitioned to progressive care.  He was transferred to my care on day 5 of his hospital stay on 12/22/2018.   Subjective: Patient in bed, appears comfortable, denies any headache, no fever, no chest pain or pressure, +ve shortness of breath , no abdominal pain. No focal weakness.   Assessment & Plan:    1. Acute hypoxic respiratory failure due to COVID-19 interstitial pneumonia.  He was started on IV steroids along with IV REMDESIVIR, clinically improved, briefly intubated for 2 days, extubated on 12/20/2018.  3 on 6 L nasal cannula oxygen, still slightly short of breath, continue to monitor, inflammatory markers have stabilized although still somewhat elevated, encouraged to sit up in chair use I-S and flutter valve for pulmonary toiletry, advance activity and monitor closely.  COVID-19 Labs  Recent Labs    12/21/18 0200 12/22/18 0415 12/23/18 0500  DDIMER 6.20* 5.65* 3.44*  FERRITIN 1,831* 1,097* 989*  LDH  --   --  369*  CRP 11.3* 8.4* 7.8*    Lab Results  Component Value Date   SARSCOV2NAA POSITIVE (A) 12/18/2018      2. Sepsis secondary to COVID-19.  Blood cultures negative stable procalcitonin.  Monitor.  3. Hypokalemia.  Replaced.  Magnesium is normal.  4. Eessential hypertension.  Placed on PRN hydralazine.  5.  BPH.  On alpha-blocker continue.  6.  Anxiety and insomnia.  Home medications continued, he also has  insomnia and used to taking trazodone and Xanax at night which will be continued.  7. OSA -CPAP at night, currently cannot use CPAP due to his weight positive status, oxygen at night and monitor.    DVT prophylaxis: Lovenox Code Status: Full code Family Communication: updated significant other on 6/23 Disposition Plan: Transfer to progressive care   Consultants:   PCCM  Procedures:   Intubation 6/20 > 6/22   Remdesivir 6/20 >    Objective: Vitals:   12/22/18 1945 12/22/18 2330 12/23/18 0515 12/23/18 0727  BP: 130/67 (!) 161/86 (!) 166/77 (!) 150/80  Pulse: 91 68 66 90  Resp: (!) 21 (!) 25 (!) 28 (!) 24  Temp: (!) 97.5 F (36.4 C) 98.3 F (36.8 C) 98.8 F (37.1 C)   TempSrc: Oral Oral Oral   SpO2: 90% 93% 91% 90%  Weight:      Height:        Intake/Output Summary (Last 24 hours) at 12/23/2018 1108 Last data filed at 12/23/2018 0515 Gross per 24 hour  Intake -  Output 1075 ml  Net -1075 ml   Filed Weights   12/18/18 1307 12/19/18 0510 12/20/18 0445  Weight: 79.4 kg 75.8 kg 75.2 kg    Examination:  Awake Alert, Oriented X 3, No new F.N deficits, Normal affect Faxon.AT,PERRAL Supple Neck,No JVD, No cervical lymphadenopathy appriciated.  Symmetrical Chest wall movement, Good air movement bilaterally, CTAB RRR,No Gallops, Rubs or new Murmurs, No Parasternal Heave +ve B.Sounds, Abd Soft, No tenderness, No organomegaly appriciated,  No rebound - guarding or rigidity. No Cyanosis, Clubbing or edema, No new Rash or bruise    Data Reviewed: I have personally reviewed following labs and imaging studies  CBC: Recent Labs  Lab 12/18/18 1333  12/20/18 0120 12/20/18 0517 12/21/18 0200 12/22/18 0415 12/23/18 0500  WBC 6.9  --  10.0  --  8.4 8.4 8.9  NEUTROABS 6.4  --  9.5*  --  7.8* 7.9* 8.3*  HGB 14.0   < > 12.1* 11.2* 11.4* 11.3* 11.9*  HCT 38.5*   < > 34.6* 33.0* 33.0* 32.9* 34.1*  MCV 91.9  --  94.8  --  95.1 96.2 94.7  PLT 304  --  310  --  345 336 302    < > = values in this interval not displayed.   Basic Metabolic Panel: Recent Labs  Lab 12/19/18 0729 12/20/18 0120 12/20/18 0517 12/21/18 0200 12/22/18 0415 12/23/18 0500  NA 139 142 141 144 143 139  K 2.7* 3.2* 3.1* 3.1* 4.4 3.7  CL 97* 103  --  106 106 105  CO2 29 29  --  29 27 27   GLUCOSE 197* 202*  --  221* 174* 168*  BUN 16 31*  --  29* 20 20  CREATININE 0.70 0.74  --  0.88 0.80 0.71  CALCIUM 8.2* 8.3*  --  8.1* 7.8* 7.7*  MG 2.5* 2.5*  --  2.2 1.9 1.9  PHOS  --  2.9  --  2.2* 4.3  --    GFR: Estimated Creatinine Clearance: 81.5 mL/min (by C-G formula based on SCr of 0.71 mg/dL). Liver Function Tests: Recent Labs  Lab 12/19/18 0729 12/20/18 0120 12/21/18 0200 12/22/18 0415 12/23/18 0500  AST 63* 67* 80* 38 33  ALT 86* 86* 107* 79* 67*  ALKPHOS 89 87 90 78 83  BILITOT 0.5 0.6 0.4 0.8 0.7  PROT 6.0* 5.8* 5.2* 5.2* 5.2*  ALBUMIN 2.4* 2.2* 2.0* 2.0* 2.0*   Recent Labs  Lab 12/18/18 1333  LIPASE 51   No results for input(s): AMMONIA in the last 168 hours. Coagulation Profile: No results for input(s): INR, PROTIME in the last 168 hours. Cardiac Enzymes: Recent Labs  Lab 12/18/18 1333 12/20/18 0120 12/21/18 0200 12/22/18 0415  CKTOTAL 35* 14* 14* 15*  TROPONINI 0.03*  --   --   --    BNP (last 3 results) No results for input(s): PROBNP in the last 8760 hours. HbA1C: Recent Labs    12/21/18 0200  HGBA1C 6.5*   CBG: Recent Labs  Lab 12/22/18 0820 12/22/18 1214 12/22/18 1607 12/22/18 2033 12/23/18 0827  GLUCAP 159* 253* 229* 202* 165*   Lipid Profile: No results for input(s): CHOL, HDL, LDLCALC, TRIG, CHOLHDL, LDLDIRECT in the last 72 hours. Thyroid Function Tests: No results for input(s): TSH, T4TOTAL, FREET4, T3FREE, THYROIDAB in the last 72 hours. Anemia Panel: Recent Labs    12/22/18 0415 12/23/18 0500  FERRITIN 1,097* 989*   Sepsis Labs: Recent Labs  Lab 12/18/18 1333 12/18/18 1600 12/19/18 0729 12/20/18 0120 12/21/18 0200   PROCALCITON 1.24  --  1.15 0.90 0.58  LATICACIDVEN 3.5* 2.3*  --   --   --     Recent Results (from the past 240 hour(s))  SARS Coronavirus 2 (CEPHEID - Performed in [place] hospital lab), Hosp Order     Status: Abnormal   Collection Time: 12/18/18  1:13 PM   Specimen: Nasopharyngeal Swab  Result Value Ref Range Status   SARS Coronavirus 2 POSITIVE (  A) NEGATIVE Final    Comment: CRITICAL RESULT CALLED TO, READ BACK BY AND VERIFIED WITH: A. TUTTLE @1426   12/18/18 KAY (NOTE) If result is NEGATIVE SARS-CoV-2 target nucleic acids are NOT DETECTED. The SARS-CoV-2 RNA is generally detectable in upper and lower  respiratory specimens during the acute phase of infection. The lowest  concentration of SARS-CoV-2 viral copies this assay can detect is 250  copies / mL. A negative result does not preclude SARS-CoV-2 infection  and should not be used as the sole basis for treatment or other  patient management decisions.  A negative result may occur with  improper specimen collection / handling, submission of specimen other  than nasopharyngeal swab, presence of viral mutation(s) within the  areas targeted by this assay, and inadequate number of viral copies  (<250 copies / mL). A negative result must be combined with clinical  observations, patient history, and epidemiological information. If result is POSITIVE SARS-CoV-2 target nucleic acids are DETECTED.  The SARS-CoV-2 RNA is generally detectable in upper and lower  respiratory specimens during the acute phase of infection.  Positive  results are indicative of active infection with SARS-CoV-2.  Clinical  correlation with patient history and other diagnostic information is  necessary to determine patient infection status.  Positive results do  not rule out bacterial infection or co-infection with other viruses. If result is PRESUMPTIVE POSTIVE SARS-CoV-2 nucleic acids MAY BE PRESENT.   A presumptive positive result was obtained on the  submitted specimen  and confirmed on repeat testing.  While 2019 novel coronavirus  (SARS-CoV-2) nucleic acids may be present in the submitted sample  additional confirmatory testing may be necessary for epidemiological  and / or clinical management purposes  to differentiate between  SARS-CoV-2 and other Sarbecovirus currently known to infect humans.  If clinically indicated additional testing with an alternate test  methodology [phone])  is advised. The SARS-CoV-2 RNA is generally  detectable in upper and lower respiratory specimens during the acute  phase of infection. The expected result is Negative. Fact Sheet for Patients:  StrictlyIdeas.no Fact Sheet for Healthcare Providers: BankingDealers.co.za This test is not yet approved or cleared by the [place] FDA and has been authorized for detection and/or diagnosis of SARS-CoV-2 by FDA under an Emergency Use Authorization (EUA).  This EUA will remain in effect (meaning this test can be used) for the duration of the COVID-19 declaration under Section 564(b)(1) of the Act, 21 U.S.C. section 360bbb-3(b)(1), unless the authorization is terminated or revoked sooner. Performed at [facility], [address] [place], [place] [address]   Blood Culture (routine x 2)     Status: None   Collection Time: 12/18/18  1:44 PM   Specimen: Left Antecubital; Blood  Result Value Ref Range Status   Specimen Description   Final    LEFT ANTECUBITAL BOTTLES DRAWN AEROBIC AND ANAEROBIC   Special Requests Blood Culture adequate volume  Final   Culture   Final    NO GROWTH 5 DAYS Performed at [facility], [address] [place], [place] [address]    Report Status 12/23/2018 FINAL  Final  Blood Culture (routine x 2)     Status: None   Collection Time: 12/18/18  1:45 PM   Specimen: BLOOD RIGHT WRIST  Result Value Ref Range Status   Specimen Description   Final    BLOOD RIGHT WRIST BOTTLES DRAWN  AEROBIC AND ANAEROBIC   Special Requests Blood Culture adequate volume  Final   Culture  Final    NO GROWTH 5 DAYS Performed at [facility], [address], [place], [place] [address]    Report Status 12/23/2018 FINAL  Final  MRSA PCR Screening     Status: None   Collection Time: 12/19/18  5:24 AM   Specimen: Nasal Mucosa; Nasopharyngeal  Result Value Ref Range Status   MRSA by PCR NEGATIVE NEGATIVE Final    Comment:        The GeneXpert MRSA Assay (FDA approved for NASAL specimens only), is one component of a comprehensive MRSA colonization surveillance program. It is not intended to diagnose MRSA infection nor to guide or monitor treatment for MRSA infections. Performed at [facility], [place] [address] [place], [place] [address]   C difficile quick scan w PCR reflex     Status: None   Collection Time: 12/20/18 10:02 PM   Specimen: STOOL  Result Value Ref Range Status   C Diff antigen NEGATIVE NEGATIVE Final   C Diff toxin NEGATIVE NEGATIVE Final   C Diff interpretation No C. difficile detected.  Final    Comment: Performed at [facility], [place] [address], [place], [place] [address]         Radiology Studies: No results found.   Scheduled Meds: . aspirin  81 mg Oral Daily  . atorvastatin  20 mg Oral Daily  . busPIRone  10 mg Oral BID  . Chlorhexidine Gluconate Cloth  6 each Topical Daily  . cholecalciferol  5,000 Units Oral Daily  . enoxaparin (LOVENOX) injection  40 mg Subcutaneous Q12H  . feeding supplement (ENSURE ENLIVE)  237 mL Oral TID BM  . fluticasone  2 spray Each Nare Daily  . gabapentin  300 mg Oral 2 times per day   And  . gabapentin  600 mg Oral QHS  . insulin aspart  0-20 Units Subcutaneous TID WC  . insulin aspart  0-5 Units Subcutaneous QHS  . latanoprost  1 drop Both Eyes QHS  . mouth rinse  15 mL Mouth Rinse BID  . methylPREDNISolone (SOLU-MEDROL) injection  40 mg Intravenous Q12H  . multivitamin with  minerals  1 tablet Oral Daily  . pantoprazole  40 mg Oral Daily  . saccharomyces boulardii  250 mg Oral BID  . tamsulosin  0.4 mg Oral QPC breakfast  . venlafaxine XR  75 mg Oral Q breakfast   Continuous Infusions: . remdesivir 100 mg in NS 250 mL 100 mg (12/23/18 1043)     LOS: 5 days   Signature  Lala Lund M.D on 12/23/2018 at 11:08 AM   -  To page go to [email or web address]

## 2018-12-23 NOTE — Progress Notes (Signed)
Occupational Therapy Evaluation Patient Details Name: Joel Greene MRN: [id number] DOB: June 15, 1951 Today's Date: 12/23/2018    History of Present Illness 68 year old male was initially evaluated at a [facility] and transferred to [place] when he tested positive for COVID-19.  He had presented 12/18/18 with fevers, chills, shortness of breath.  He became progressively hypoxic and tachypneic. + COVID 19  He was intubated in the emergency room prior to transfer to [facility].  Currently on steroids, remdesivir and antibiotics.  Patient was able to extubate on 6/22.    Clinical Impression   PTA, pt lived alone and was independent with ADL and mobility. Enjoys hunting and fishing and "eating steak". Pt becomes tachypnic when moving EOB with increased WOB/belly breathing and SpO2 desat into 70s. Incresaed to 6L HFNC and pt eventaully returned to 90s with vc for slow pursed lip breathing. ADL and mobility significantly limited by tachypnea. Pt educated on level 1 theraband to complete in his room. Pt very appreciative. Pt lives alone, therefore, at this time recommend [facility] services after DC. Will follow acutely.     Follow Up Recommendations  Home health OT;Supervision - Intermittent    Equipment Recommendations  3 in 1 bedside commode    Recommendations for Other Services       Precautions / Restrictions Precautions Precautions: Other (comment) Precaution Comments: monitor sats, RR, HR      Mobility Bed Mobility Overal bed mobility: Modified Independent             General bed mobility comments: HOB elevated, no rail  Transfers Overall transfer level: Needs assistance Equipment used: None Transfers: Stand Pivot Transfers   Stand pivot transfers: Min assist       General transfer comment: became tachypnic EOB    Balance Overall balance assessment: Mild deficits observed, not formally tested                                          ADL either performed or assessed with clinical judgement   ADL Overall ADL's : Needs assistance/impaired Eating/Feeding: Independent   Grooming: Set up;Sitting   Upper Body Bathing: Set up;Sitting   Lower Body Bathing: Moderate assistance;Sit to/from stand   Upper Body Dressing : Minimal assistance;Sitting   Lower Body Dressing: Moderate assistance;Sit to/from stand   Toilet Transfer: Minimal assistance;Stand-pivot   Toileting- Water quality scientist and Hygiene: Min guard       Functional mobility during ADLs: Minimal assistance General ADL Comments: Significantly limited by fatigue; tachypnea     Vision Baseline Vision/History: Glaucoma       Perception     Praxis      Pertinent Vitals/Pain Pain Assessment: Faces Faces Pain Scale: Hurts little more Pain Location: head; abdomen Pain Descriptors / Indicators: Aching;Discomfort;Cramping Pain Intervention(s): Limited activity within patient's tolerance     Hand Dominance Right   Extremity/Trunk Assessment Upper Extremity Assessment Upper Extremity Assessment: Generalized weakness   Lower Extremity Assessment Lower Extremity Assessment: Defer to PT evaluation   Cervical / Trunk Assessment Cervical / Trunk Assessment: Normal   Communication Communication Communication: Other (comment)(required cues to stop talking and breath for SaO2)   Cognition Arousal/Alertness: Awake/alert Behavior During Therapy: WFL for tasks assessed/performed Overall Cognitive Status: Within Functional Limits for tasks assessed  General Comments  Educated on importance of proning    Exercises Exercises: Other exercises Other Exercises Other Exercises: Covid HEP with level 1 theraband Other Exercises: pursed lip breathing   Shoulder Instructions      Home Living Family/patient expects to be discharged to:: Private residence Living Arrangements: Alone Available Help at  Discharge: Friend(s)(girlfriend) Type of Home: House Home Access: Stairs to enter [employer] of Steps: 3 Entrance Stairs-Rails: Right Home Layout: One level     Bathroom Shower/Tub: Teacher, early years/pre: Standard Bathroom Accessibility: Yes How Accessible: Accessible via walker Home Equipment: None          Prior Functioning/Environment Level of Independence: Independent        Comments: likes hunting and fishing        OT Problem List: Decreased strength;Decreased activity tolerance;Decreased knowledge of use of DME or AE;Cardiopulmonary status limiting activity;Pain      OT Treatment/Interventions: Self-care/ADL training;Therapeutic exercise;Energy conservation;DME and/or AE instruction;Therapeutic activities;Patient/family education    OT Goals(Current goals can be found in the care plan section) Acute Rehab OT Goals Patient Stated Goal: go home soon OT Goal Formulation: With patient Time For Goal Achievement: 01/06/19 Potential to Achieve Goals: Good  OT Frequency: Min 3X/week   Barriers to D/C:            Co-evaluation              AM-PAC OT "6 Clicks" Daily Activity     Outcome Measure Help from another person eating meals?: None Help from another person taking care of personal grooming?: A Little Help from another person toileting, which includes using toliet, bedpan, or urinal?: A Little Help from another person bathing (including washing, rinsing, drying)?: A Lot Help from another person to put on and taking off regular upper body clothing?: A Little Help from another person to put on and taking off regular lower body clothing?: A Lot 6 Click Score: 17   End of Session Equipment Utilized During Treatment: Oxygen(6L) Nurse Communication: Mobility status  Activity Tolerance: Patient tolerated treatment well Patient left: in chair;with call bell/phone within reach  OT Visit Diagnosis: Unsteadiness on feet  (R26.81);Muscle weakness (generalized) (M62.81);Pain Pain - part of body: (head; abdomen)                Time: 3383-2919 OT Time Calculation (min): 44 min Charges:  OT General Charges $OT Visit: 1 Visit OT Evaluation $OT Eval Moderate Complexity: 1 Mod OT Treatments $Self Care/Home Management : 8-22 mins $Therapeutic Exercise: 8-22 mins Maurie Boettcher, OT/L   Acute OT Clinical Specialist [place] Pager [phone] Office [phone]   [facility] 12/23/2018, 12:35 PM

## 2018-12-24 DIAGNOSIS — R140 Abdominal distension (gaseous): Secondary | ICD-10-CM

## 2018-12-24 DIAGNOSIS — R0602 Shortness of breath: Secondary | ICD-10-CM

## 2018-12-24 LAB — CBC WITH DIFFERENTIAL/PLATELET
Abs Immature Granulocytes: 0.2 10*3/uL — ABNORMAL HIGH (ref 0.00–0.07)
Basophils Absolute: 0 10*3/uL (ref 0.0–0.1)
Basophils Relative: 0 %
Eosinophils Absolute: 0 10*3/uL (ref 0.0–0.5)
Eosinophils Relative: 0 %
HCT: 36.9 % — ABNORMAL LOW (ref 39.0–52.0)
Hemoglobin: 12.8 g/dL — ABNORMAL LOW (ref 13.0–17.0)
Immature Granulocytes: 2 %
Lymphocytes Relative: 2 %
Lymphs Abs: 0.2 10*3/uL — ABNORMAL LOW (ref 0.7–4.0)
MCH: 33.2 pg (ref 26.0–34.0)
MCHC: 34.7 g/dL (ref 30.0–36.0)
MCV: 95.8 fL (ref 80.0–100.0)
Monocytes Absolute: 0.3 10*3/uL (ref 0.1–1.0)
Monocytes Relative: 3 %
Neutro Abs: 9.9 10*3/uL — ABNORMAL HIGH (ref 1.7–7.7)
Neutrophils Relative %: 93 %
Platelets: 338 10*3/uL (ref 150–400)
RBC: 3.85 MIL/uL — ABNORMAL LOW (ref 4.22–5.81)
RDW: 13 % (ref 11.5–15.5)
WBC: 10.6 10*3/uL — ABNORMAL HIGH (ref 4.0–10.5)
nRBC: 0 % (ref 0.0–0.2)

## 2018-12-24 LAB — POCT I-STAT 7, (LYTES, BLD GAS, ICA,H+H)
Acid-Base Excess: 8 mmol/L — ABNORMAL HIGH (ref 0.0–2.0)
Bicarbonate: 28.5 mmol/L — ABNORMAL HIGH (ref 20.0–28.0)
Calcium, Ion: 1.13 mmol/L — ABNORMAL LOW (ref 1.15–1.40)
HCT: 41 % (ref 39.0–52.0)
Hemoglobin: 13.9 g/dL (ref 13.0–17.0)
O2 Saturation: 91 %
Patient temperature: 97.6
Potassium: 3.6 mmol/L (ref 3.5–5.1)
Sodium: 139 mmol/L (ref 135–145)
TCO2: 29 mmol/L (ref 22–32)
pCO2 arterial: 27.1 mmHg — ABNORMAL LOW (ref 32.0–48.0)
pH, Arterial: 7.629 (ref 7.350–7.450)
pO2, Arterial: 47 mmHg — ABNORMAL LOW (ref 83.0–108.0)

## 2018-12-24 LAB — COMPREHENSIVE METABOLIC PANEL
ALT: 56 U/L — ABNORMAL HIGH (ref 0–44)
AST: 29 U/L (ref 15–41)
Albumin: 2.2 g/dL — ABNORMAL LOW (ref 3.5–5.0)
Alkaline Phosphatase: 82 U/L (ref 38–126)
Anion gap: 8 (ref 5–15)
BUN: 22 mg/dL (ref 8–23)
CO2: 28 mmol/L (ref 22–32)
Calcium: 7.9 mg/dL — ABNORMAL LOW (ref 8.9–10.3)
Chloride: 103 mmol/L (ref 98–111)
Creatinine, Ser: 0.68 mg/dL (ref 0.61–1.24)
GFR calc Af Amer: >60 mL/min (ref >60)
GFR calc non Af Amer: >60 mL/min (ref >60)
Glucose, Bld: 177 mg/dL — ABNORMAL HIGH (ref 70–99)
Potassium: 3.9 mmol/L (ref 3.5–5.1)
Sodium: 139 mmol/L (ref 135–145)
Total Bilirubin: 0.5 mg/dL (ref 0.3–1.2)
Total Protein: 5.2 g/dL — ABNORMAL LOW (ref 6.5–8.1)

## 2018-12-24 LAB — BRAIN NATRIURETIC PEPTIDE: B Natriuretic Peptide: 278.5 pg/mL — ABNORMAL HIGH (ref 0.0–100.0)

## 2018-12-24 LAB — C-REACTIVE PROTEIN: CRP: 8.8 mg/dL — ABNORMAL HIGH (ref <1.0)

## 2018-12-24 LAB — D-DIMER, QUANTITATIVE: D-Dimer, Quant: 2.62 ug/mL-FEU — ABNORMAL HIGH (ref 0.00–0.50)

## 2018-12-24 LAB — MAGNESIUM: Magnesium: 2.2 mg/dL (ref 1.7–2.4)

## 2018-12-24 LAB — GLUCOSE, CAPILLARY
Glucose-Capillary: 127 mg/dL — ABNORMAL HIGH (ref 70–99)
Glucose-Capillary: 136 mg/dL — ABNORMAL HIGH (ref 70–99)
Glucose-Capillary: 203 mg/dL — ABNORMAL HIGH (ref 70–99)

## 2018-12-24 LAB — FERRITIN: Ferritin: 1083 ng/mL — ABNORMAL HIGH (ref 24–336)

## 2018-12-24 LAB — LACTATE DEHYDROGENASE: LDH: 376 U/L — ABNORMAL HIGH (ref 98–192)

## 2018-12-24 MED ORDER — POTASSIUM CHLORIDE CRYS ER 20 MEQ PO TBCR
40.0000 meq | EXTENDED_RELEASE_TABLET | Freq: Once | ORAL | Status: AC
  Administered 2018-12-24: 40 meq via ORAL
  Filled 2018-12-24: qty 2

## 2018-12-24 MED ORDER — FUROSEMIDE 10 MG/ML IJ SOLN
40.0000 mg | Freq: Once | INTRAMUSCULAR | Status: AC
  Administered 2018-12-24: 09:00:00 40 mg via INTRAVENOUS
  Filled 2018-12-24: qty 4

## 2018-12-24 MED ORDER — TOCILIZUMAB 400 MG/20ML IV SOLN
8.0000 mg/kg | Freq: Once | INTRAVENOUS | Status: AC
  Administered 2018-12-24: 640 mg via INTRAVENOUS
  Filled 2018-12-24: qty 32

## 2018-12-24 MED ORDER — FUROSEMIDE 10 MG/ML IJ SOLN
40.0000 mg | Freq: Once | INTRAMUSCULAR | Status: AC
  Administered 2018-12-24: 12:00:00 40 mg via INTRAVENOUS
  Filled 2018-12-24: qty 4

## 2018-12-24 NOTE — Progress Notes (Signed)
Physical Therapy Treatment Patient Details Name: Joel Greene MRN: [id number] DOB: 12-25-1950 Today's Date: 12/24/2018    History of Present Illness Pt is a 68 y.o. male admitted 12/18/18 with fever, chills and SOB; became progressively hypoxic and tachypnic, tested postiive for COVID-19. ETT 6/20-6/22. PMH includes OSA, IBS, DJD, glaucoma.   PT Comments    Pt not progressing with mobility. Upon sitting at edge of bed, pt with tachypneic with SpO2 quickly down to 78% on 13L O2 HFNC. SpO2 fluctuating from 78-85% on 13L with bed level mobility and therex. Pt tangential with speech despite max, frequent cues to focus on deep breathing. RN notified of vitals.    Follow Up Recommendations  Home health PT;Supervision - Intermittent     Equipment Recommendations  (TBD)    Recommendations for Other Services       Precautions / Restrictions Precautions Precautions: Fall;Other (comment) Precaution Comments: monitor sats, RR, HR Restrictions Weight Bearing Restrictions: No    Mobility  Bed Mobility Overal bed mobility: Modified Independent             General bed mobility comments: Increased time and effort, but no physical assist required. Significant DOE with SpO2 down to 78% on 13L HFNC  Transfers                 General transfer comment: Unable secondary to tachypnea and hypoxia  Ambulation/Gait                 Stairs             Wheelchair Mobility    Modified Rankin (Stroke Patients Only)       Balance Overall balance assessment: Needs assistance   Sitting balance-Leahy Scale: Fair                                      Cognition Arousal/Alertness: Awake/alert Behavior During Therapy: WFL for tasks assessed/performed Overall Cognitive Status: Within Functional Limits for tasks assessed                                        Exercises General Exercises - Lower Extremity Heel Slides:  AROM;Both;Supine Straight Leg Raises: AROM;Both;Supine Hip Flexion/Marching: AROM;Both;Supine    General Comments General comments (skin integrity, edema, etc.): Pt tachypneic and hypoxic but continues to talk/tangential with speech despite max, frequent cues to focus on deep breathing. Pt reports understanding then goes right back to talking      Pertinent Vitals/Pain Pain Assessment: Faces Faces Pain Scale: Hurts even more Pain Location: Headache Pain Descriptors / Indicators: Throbbing;Headache Pain Intervention(s): Monitored during session;Premedicated before session    Home Living                      Prior Function            PT Goals (current goals can now be found in the care plan section) Acute Rehab PT Goals Patient Stated Goal: go home soon PT Goal Formulation: With patient Time For Goal Achievement: 01/05/19 Potential to Achieve Goals: Fair Progress towards PT goals: Not progressing toward goals - comment    Frequency    Min 3X/week      PT Plan Discharge plan needs to be updated    Co-evaluation  AM-PAC PT "6 Clicks" Mobility   Outcome Measure  Help needed turning from your back to your side while in a flat bed without using bedrails?: None Help needed moving from lying on your back to sitting on the side of a flat bed without using bedrails?: None Help needed moving to and from a bed to a chair (including a wheelchair)?: A Little Help needed standing up from a chair using your arms (e.g., wheelchair or bedside chair)?: A Little Help needed to walk in hospital room?: A Little Help needed climbing 3-5 steps with a railing? : A Lot 6 Click Score: 19    End of Session Equipment Utilized During Treatment: Oxygen Activity Tolerance: Treatment limited secondary to medical complications (Comment) Patient left: in bed;with call bell/phone within reach Nurse Communication: Mobility status;Other (comment)(low SpO2) PT Visit  Diagnosis: Difficulty in walking, not elsewhere classified (R26.2)     Time: 8832-5498 PT Time Calculation (min) (ACUTE ONLY): 18 min  Charges:  $Therapeutic Exercise: 8-22 mins                    Mabeline Caras, PT, DPT Acute Rehabilitation Services  Pager [phone] Office [place] 12/24/2018, 5:33 PM

## 2018-12-24 NOTE — Progress Notes (Signed)
SPO2 79%. Patient is anxious. Directed to prone. Patient states he is unable to prone due to anxiety. Rapid response initiated.

## 2018-12-24 NOTE — Plan of Care (Signed)
  Problem: Education: Goal: Knowledge of General Education information will improve Description: Patient currently intubated and sedated. Will continue to monitor.  Outcome: Progressing   Problem: Health Behavior/Discharge Planning: Goal: Ability to manage health-related needs will improve Outcome: Progressing   Problem: Clinical Measurements: Goal: Ability to maintain clinical measurements within normal limits will improve Outcome: Progressing Goal: Will remain free from infection Outcome: Progressing Goal: Diagnostic test results will improve Outcome: Progressing Goal: Respiratory complications will improve Outcome: Progressing Goal: Cardiovascular complication will be avoided Outcome: Progressing   Problem: Activity: Goal: Risk for activity intolerance will decrease Outcome: Progressing   Problem: Nutrition: Goal: Adequate nutrition will be maintained Outcome: Progressing   Problem: Coping: Goal: Level of anxiety will decrease Outcome: Progressing   Problem: Elimination: Goal: Will not experience complications related to bowel motility Outcome: Progressing Goal: Will not experience complications related to urinary retention Outcome: Progressing   Problem: Pain Managment: Goal: General experience of comfort will improve Outcome: Progressing   Problem: Safety: Goal: Ability to remain free from injury will improve Outcome: Progressing   Problem: Skin Integrity: Goal: Risk for impaired skin integrity will decrease Outcome: Progressing   Problem: Skin Integrity: Goal: Risk for impaired skin integrity will decrease Outcome: Progressing

## 2018-12-24 NOTE — Progress Notes (Signed)
Patient is anxious, breathing through mouth. SPO2 80s. Reassured, redirected. Now breathing through nose with HFNC @15L /min. SPO2 responds to 90%.

## 2018-12-24 NOTE — Progress Notes (Signed)
Patient transferred to ICU for closer monitoring due to increased oxygen demand.

## 2018-12-24 NOTE — Progress Notes (Signed)
PROGRESS NOTE    Joel Greene  JZP:[id number] DOB: 1950-07-18 DOA: 12/18/2018 PCP: Redmond School, MD    Brief Narrative:  68 year old male was initially evaluated at a [facility] hospital and transferred to [place] when he tested positive for COVID-19.  He had presented with fevers, chills, shortness of breath.  He became progressively hypoxic and tachypneic.  He was intubated in the emergency room prior to transfer to [facility].  Currently on steroids, remdesivir and antibiotics.  Patient was able to extubate on 6/22.  He has been transitioned to progressive care.  He was transferred to my care on day 5 of his hospital stay on 12/22/2018.   Subjective: Patient in bed, appears comfortable, denies any headache, no fever, no chest pain or pressure, +ve shortness of breath , no abdominal pain. No focal weakness.  Assessment & Plan:    1. Acute hypoxic respiratory failure due to COVID-19 interstitial pneumonia.  He was started on IV steroids along with IV REMDESIVIR, clinically improved, briefly intubated for 2 days, extubated on 12/20/2018.  Continues to be on 6 to 8 L high flow nasal cannula oxygen, still mains short of breath will give him Actemra IV on 12/24/2018.  He understands risks and benefits and wants to proceed with it, denies any history of TB or hepatitis, inflammatory markers still somewhat elevated, continue to monitor requested to prone at night, sit up in chair and use I-S and flutter valve for pulmonary toiletry in the daytime.  COVID-19 Labs  Recent Labs    12/22/18 0415 12/23/18 0500 12/24/18 0300  DDIMER 5.65* 3.44* 2.62*  FERRITIN 1,097* 989* 1,083*  LDH  --  369* 376*  CRP 8.4* 7.8* 8.8*    Lab Results  Component Value Date   SARSCOV2NAA POSITIVE (A) 12/18/2018      2. Sepsis secondary to COVID-19.  Blood cultures negative stable procalcitonin.  Monitor.  3. Hypokalemia.  Replaced.  Magnesium is normal.  4. Eessential hypertension.   Placed on PRN hydralazine.  5.  BPH.  On alpha-blocker continue.  6.  Anxiety and insomnia.  Home medications continued, he also has insomnia and used to taking trazodone and Xanax at night which will be continued.  7. OSA -CPAP at night, currently cannot use CPAP due to his weight positive status, oxygen at night and monitor.  8.  Acute on chronic nonspecific CHF.  No previous echo on file.  Diurese with Lasix IV on 12/24/2018 and monitor.  BNP was elevated.  Outpatient echocardiogram.    DVT prophylaxis: Lovenox Code Status: Full code Family Communication: updated significant other on 6/23 Disposition Plan: Transfer to progressive care   Consultants:   PCCM  Procedures:   Intubation 6/20 > 6/22   Remdesivir 6/20 >    Objective: Vitals:   12/24/18 0334 12/24/18 0500 12/24/18 0600 12/24/18 0830  BP: (!) 158/92   132/75  Pulse: 68   90  Resp: 20   (!) 24  Temp: 98.5 F (36.9 C)     TempSrc: Oral     SpO2: 93%   92%  Weight:  80.1 kg 80.1 kg   Height:        Intake/Output Summary (Last 24 hours) at 12/24/2018 1033 Last data filed at 12/24/2018 0500 Gross per 24 hour  Intake 480 ml  Output 1300 ml  Net -820 ml   Filed Weights   12/20/18 0445 12/24/18 0500 12/24/18 0600  Weight: 75.2 kg 80.1 kg 80.1 kg  Examination:  Awake Alert, Oriented X 3, No new F.N deficits, Normal affect [place].AT,PERRAL Supple Neck,No JVD, No cervical lymphadenopathy appriciated.  Symmetrical Chest wall movement, Good air movement bilaterally, +ve rales RRR,No Gallops, Rubs or new Murmurs, No Parasternal Heave +ve B.Sounds, Abd Soft, No tenderness, No organomegaly appriciated, No rebound - guarding or rigidity. No Cyanosis, Clubbing or edema, No new Rash or bruise   Data Reviewed: I have personally reviewed following labs and imaging studies  CBC: Recent Labs  Lab 12/20/18 0120 12/20/18 0517 12/21/18 0200 12/22/18 0415 12/23/18 0500 12/24/18 0300  WBC 10.0  --  8.4 8.4 8.9  10.6*  NEUTROABS 9.5*  --  7.8* 7.9* 8.3* 9.9*  HGB 12.1* 11.2* 11.4* 11.3* 11.9* 12.8*  HCT 34.6* 33.0* 33.0* 32.9* 34.1* 36.9*  MCV 94.8  --  95.1 96.2 94.7 95.8  PLT 310  --  345 336 302 563   Basic Metabolic Panel: Recent Labs  Lab 12/20/18 0120 12/20/18 0517 12/21/18 0200 12/22/18 0415 12/23/18 0500 12/24/18 0300  NA 142 141 144 143 139 139  K 3.2* 3.1* 3.1* 4.4 3.7 3.9  CL 103  --  106 106 105 103  CO2 29  --  29 27 27 28   GLUCOSE 202*  --  221* 174* 168* 177*  BUN 31*  --  29* 20 20 22   CREATININE 0.74  --  0.88 0.80 0.71 0.68  CALCIUM 8.3*  --  8.1* 7.8* 7.7* 7.9*  MG 2.5*  --  2.2 1.9 1.9 2.2  PHOS 2.9  --  2.2* 4.3  --   --    GFR: Estimated Creatinine Clearance: 88.4 mL/min (by C-G formula based on SCr of 0.68 mg/dL). Liver Function Tests: Recent Labs  Lab 12/20/18 0120 12/21/18 0200 12/22/18 0415 12/23/18 0500 12/24/18 0300  AST 67* 80* 38 33 29  ALT 86* 107* 79* 67* 56*  ALKPHOS 87 90 78 83 82  BILITOT 0.6 0.4 0.8 0.7 0.5  PROT 5.8* 5.2* 5.2* 5.2* 5.2*  ALBUMIN 2.2* 2.0* 2.0* 2.0* 2.2*   Recent Labs  Lab 12/18/18 1333  LIPASE 51   No results for input(s): AMMONIA in the last 168 hours. Coagulation Profile: No results for input(s): INR, PROTIME in the last 168 hours. Cardiac Enzymes: Recent Labs  Lab 12/18/18 1333 12/20/18 0120 12/21/18 0200 12/22/18 0415  CKTOTAL 35* 14* 14* 15*  TROPONINI 0.03*  --   --   --    BNP (last 3 results) No results for input(s): PROBNP in the last 8760 hours. HbA1C: No results for input(s): HGBA1C in the last 72 hours. CBG: Recent Labs  Lab 12/23/18 0827 12/23/18 1149 12/23/18 1700 12/23/18 2106 12/24/18 0810  GLUCAP 165* 134* 189* 152* 127*   Lipid Profile: No results for input(s): CHOL, HDL, LDLCALC, TRIG, CHOLHDL, LDLDIRECT in the last 72 hours. Thyroid Function Tests: No results for input(s): TSH, T4TOTAL, FREET4, T3FREE, THYROIDAB in the last 72 hours. Anemia Panel: Recent Labs    12/23/18  0500 12/24/18 0300  FERRITIN 989* 1,083*   Sepsis Labs: Recent Labs  Lab 12/18/18 1333 12/18/18 1600 12/19/18 0729 12/20/18 0120 12/21/18 0200  PROCALCITON 1.24  --  1.15 0.90 0.58  LATICACIDVEN 3.5* 2.3*  --   --   --     Recent Results (from the past 240 hour(s))  SARS Coronavirus 2 (CEPHEID - Performed in [place] hospital lab), Hosp Order     Status: Abnormal   Collection Time: 12/18/18  1:13 PM  Specimen: Nasopharyngeal Swab  Result Value Ref Range Status   SARS Coronavirus 2 POSITIVE (A) NEGATIVE Final    Comment: CRITICAL RESULT CALLED TO, READ BACK BY AND VERIFIED WITH: A. TUTTLE @1426   12/18/18 KAY (NOTE) If result is NEGATIVE SARS-CoV-2 target nucleic acids are NOT DETECTED. The SARS-CoV-2 RNA is generally detectable in upper and lower  respiratory specimens during the acute phase of infection. The lowest  concentration of SARS-CoV-2 viral copies this assay can detect is 250  copies / mL. A negative result does not preclude SARS-CoV-2 infection  and should not be used as the sole basis for treatment or other  patient management decisions.  A negative result may occur with  improper specimen collection / handling, submission of specimen other  than nasopharyngeal swab, presence of viral mutation(s) within the  areas targeted by this assay, and inadequate number of viral copies  (<250 copies / mL). A negative result must be combined with clinical  observations, patient history, and epidemiological information. If result is POSITIVE SARS-CoV-2 target nucleic acids are DETECTED.  The SARS-CoV-2 RNA is generally detectable in upper and lower  respiratory specimens during the acute phase of infection.  Positive  results are indicative of active infection with SARS-CoV-2.  Clinical  correlation with patient history and other diagnostic information is  necessary to determine patient infection status.  Positive results do  not rule out bacterial infection or  co-infection with other viruses. If result is PRESUMPTIVE POSTIVE SARS-CoV-2 nucleic acids MAY BE PRESENT.   A presumptive positive result was obtained on the submitted specimen  and confirmed on repeat testing.  While 2019 novel coronavirus  (SARS-CoV-2) nucleic acids may be present in the submitted sample  additional confirmatory testing may be necessary for epidemiological  and / or clinical management purposes  to differentiate between  SARS-CoV-2 and other Sarbecovirus currently known to infect humans.  If clinically indicated additional testing with an alternate test  methodology [phone])  is advised. The SARS-CoV-2 RNA is generally  detectable in upper and lower respiratory specimens during the acute  phase of infection. The expected result is Negative. Fact Sheet for Patients:  StrictlyIdeas.no Fact Sheet for Healthcare Providers: BankingDealers.co.za This test is not yet approved or cleared by the [place] FDA and has been authorized for detection and/or diagnosis of SARS-CoV-2 by FDA under an Emergency Use Authorization (EUA).  This EUA will remain in effect (meaning this test can be used) for the duration of the COVID-19 declaration under Section 564(b)(1) of the Act, 21 U.S.C. section 360bbb-3(b)(1), unless the authorization is terminated or revoked sooner. Performed at [facility], [address] [place], [place] [address]   Blood Culture (routine x 2)     Status: None   Collection Time: 12/18/18  1:44 PM   Specimen: Left Antecubital; Blood  Result Value Ref Range Status   Specimen Description   Final    LEFT ANTECUBITAL BOTTLES DRAWN AEROBIC AND ANAEROBIC   Special Requests Blood Culture adequate volume  Final   Culture   Final    NO GROWTH 5 DAYS Performed at [facility], [address] [place], [place] [address]    Report Status 12/23/2018 FINAL  Final  Blood Culture (routine x 2)     Status: None    Collection Time: 12/18/18  1:45 PM   Specimen: BLOOD RIGHT WRIST  Result Value Ref Range Status   Specimen Description   Final    BLOOD RIGHT WRIST BOTTLES DRAWN AEROBIC AND ANAEROBIC  Special Requests Blood Culture adequate volume  Final   Culture   Final    NO GROWTH 5 DAYS Performed at [facility], [address], [place], [place] [address]    Report Status 12/23/2018 FINAL  Final  MRSA PCR Screening     Status: None   Collection Time: 12/19/18  5:24 AM   Specimen: Nasal Mucosa; Nasopharyngeal  Result Value Ref Range Status   MRSA by PCR NEGATIVE NEGATIVE Final    Comment:        The GeneXpert MRSA Assay (FDA approved for NASAL specimens only), is one component of a comprehensive MRSA colonization surveillance program. It is not intended to diagnose MRSA infection nor to guide or monitor treatment for MRSA infections. Performed at [facility], [place] [address] [place], [place] [address]   C difficile quick scan w PCR reflex     Status: None   Collection Time: 12/20/18 10:02 PM   Specimen: STOOL  Result Value Ref Range Status   C Diff antigen NEGATIVE NEGATIVE Final   C Diff toxin NEGATIVE NEGATIVE Final   C Diff interpretation No C. difficile detected.  Final    Comment: Performed at [facility], [place] [address] [place], [place] [address]         Radiology Studies: No results found.   Scheduled Meds: . aspirin  81 mg Oral Daily  . atorvastatin  20 mg Oral Daily  . busPIRone  10 mg Oral BID  . Chlorhexidine Gluconate Cloth  6 each Topical Daily  . cholecalciferol  5,000 Units Oral Daily  . enoxaparin (LOVENOX) injection  40 mg Subcutaneous Q12H  . feeding supplement (ENSURE ENLIVE)  237 mL Oral TID BM  . fluticasone  2 spray Each Nare Daily  . furosemide  40 mg Intravenous Once  . gabapentin  300 mg Oral 2 times per day   And  . gabapentin  600 mg Oral QHS  . insulin aspart  0-20 Units Subcutaneous TID WC  . insulin  aspart  0-5 Units Subcutaneous QHS  . latanoprost  1 drop Both Eyes QHS  . mouth rinse  15 mL Mouth Rinse BID  . methylPREDNISolone (SOLU-MEDROL) injection  40 mg Intravenous Q12H  . multivitamin with minerals  1 tablet Oral Daily  . pantoprazole  40 mg Oral Daily  . potassium chloride  40 mEq Oral Once  . saccharomyces boulardii  250 mg Oral BID  . tamsulosin  0.4 mg Oral QPC breakfast  . venlafaxine XR  75 mg Oral Q breakfast   Continuous Infusions: . tocilizumab (ACTEMRA) IV 640 mg (12/24/18 0948)     LOS: 6 days   Signature  Lala Lund M.D on 12/24/2018 at 10:33 AM   -  To page go to [email or web address]

## 2018-12-24 NOTE — Significant Event (Signed)
Rapid Response Event Note  Overview: Rapid response initiated due to low sats and work of breathing.      Initial Focused Assessment: Upon RR arrival, patient lying in bed saturations in the low 80s. HFNC at 15L. Patient lying on his left side. Conversed with the patient regarding work of breathing and saturations. Asked patient about proning and he stated that it makes him anxious and he is unable to at this time. Patient in distress taking breaths between every word during conversation. Instructed patient that he may be moved upstairs due to oxygen requirements and the need to be in a higher acuity setting.  RRT arrived. Patient placed on NRB in addition to his 15LHFNC. Saturations did improve however due to FULL CODE status patient needed to be moved to higher level of care.   Josephine Cables, MD came to bedside during event.  Patient transported to 9206 in ICU without complications.  Interventions: NRB in addition to the [place] (if not transferred): transferred to ICU for heated high flow administration      Heide Scales BSN, RN, CCRN

## 2018-12-25 ENCOUNTER — Inpatient Hospital Stay (HOSPITAL_COMMUNITY): Payer: Medicare Other

## 2018-12-25 DIAGNOSIS — J1289 Other viral pneumonia: Secondary | ICD-10-CM

## 2018-12-25 LAB — COMPREHENSIVE METABOLIC PANEL
ALT: 54 U/L — ABNORMAL HIGH (ref 0–44)
AST: 34 U/L (ref 15–41)
Albumin: 2.2 g/dL — ABNORMAL LOW (ref 3.5–5.0)
Alkaline Phosphatase: 87 U/L (ref 38–126)
Anion gap: 12 (ref 5–15)
BUN: 24 mg/dL — ABNORMAL HIGH (ref 8–23)
CO2: 26 mmol/L (ref 22–32)
Calcium: 8 mg/dL — ABNORMAL LOW (ref 8.9–10.3)
Chloride: 100 mmol/L (ref 98–111)
Creatinine, Ser: 0.81 mg/dL (ref 0.61–1.24)
GFR calc Af Amer: >60 mL/min (ref >60)
GFR calc non Af Amer: >60 mL/min (ref >60)
Glucose, Bld: 177 mg/dL — ABNORMAL HIGH (ref 70–99)
Potassium: 4 mmol/L (ref 3.5–5.1)
Sodium: 138 mmol/L (ref 135–145)
Total Bilirubin: 0.9 mg/dL (ref 0.3–1.2)
Total Protein: 5.4 g/dL — ABNORMAL LOW (ref 6.5–8.1)

## 2018-12-25 LAB — GLUCOSE, CAPILLARY
Glucose-Capillary: 131 mg/dL — ABNORMAL HIGH (ref 70–99)
Glucose-Capillary: 138 mg/dL — ABNORMAL HIGH (ref 70–99)
Glucose-Capillary: 144 mg/dL — ABNORMAL HIGH (ref 70–99)
Glucose-Capillary: 151 mg/dL — ABNORMAL HIGH (ref 70–99)
Glucose-Capillary: 177 mg/dL — ABNORMAL HIGH (ref 70–99)
Glucose-Capillary: 189 mg/dL — ABNORMAL HIGH (ref 70–99)
Glucose-Capillary: 207 mg/dL — ABNORMAL HIGH (ref 70–99)
Glucose-Capillary: 208 mg/dL — ABNORMAL HIGH (ref 70–99)
Glucose-Capillary: 221 mg/dL — ABNORMAL HIGH (ref 70–99)

## 2018-12-25 LAB — CBC WITH DIFFERENTIAL/PLATELET
Abs Immature Granulocytes: 0.13 10*3/uL — ABNORMAL HIGH (ref 0.00–0.07)
Basophils Absolute: 0 10*3/uL (ref 0.0–0.1)
Basophils Relative: 0 %
Eosinophils Absolute: 0 10*3/uL (ref 0.0–0.5)
Eosinophils Relative: 0 %
HCT: 38.8 % — ABNORMAL LOW (ref 39.0–52.0)
Hemoglobin: 13.4 g/dL (ref 13.0–17.0)
Immature Granulocytes: 1 %
Lymphocytes Relative: 3 %
Lymphs Abs: 0.2 10*3/uL — ABNORMAL LOW (ref 0.7–4.0)
MCH: 32.5 pg (ref 26.0–34.0)
MCHC: 34.5 g/dL (ref 30.0–36.0)
MCV: 94.2 fL (ref 80.0–100.0)
Monocytes Absolute: 0.2 10*3/uL (ref 0.1–1.0)
Monocytes Relative: 2 %
Neutro Abs: 8.6 10*3/uL — ABNORMAL HIGH (ref 1.7–7.7)
Neutrophils Relative %: 94 %
Platelets: 363 10*3/uL (ref 150–400)
RBC: 4.12 MIL/uL — ABNORMAL LOW (ref 4.22–5.81)
RDW: 12.9 % (ref 11.5–15.5)
WBC: 9.2 10*3/uL (ref 4.0–10.5)
nRBC: 0 % (ref 0.0–0.2)

## 2018-12-25 LAB — D-DIMER, QUANTITATIVE: D-Dimer, Quant: 2.02 ug/mL-FEU — ABNORMAL HIGH (ref 0.00–0.50)

## 2018-12-25 LAB — TYPE AND SCREEN
ABO/RH(D): O NEG
Antibody Screen: NEGATIVE

## 2018-12-25 LAB — BRAIN NATRIURETIC PEPTIDE: B Natriuretic Peptide: 134.6 pg/mL — ABNORMAL HIGH (ref 0.0–100.0)

## 2018-12-25 LAB — MAGNESIUM: Magnesium: 2.2 mg/dL (ref 1.7–2.4)

## 2018-12-25 LAB — LACTATE DEHYDROGENASE: LDH: 423 U/L — ABNORMAL HIGH (ref 98–192)

## 2018-12-25 LAB — FERRITIN: Ferritin: 1092 ng/mL — ABNORMAL HIGH (ref 24–336)

## 2018-12-25 LAB — C-REACTIVE PROTEIN: CRP: 9 mg/dL — ABNORMAL HIGH (ref <1.0)

## 2018-12-25 MED ORDER — METHYLPREDNISOLONE SODIUM SUCC 125 MG IJ SOLR
60.0000 mg | Freq: Two times a day (BID) | INTRAMUSCULAR | Status: DC
  Administered 2018-12-25 – 2018-12-28 (×6): 60 mg via INTRAVENOUS
  Filled 2018-12-25 (×6): qty 2

## 2018-12-25 MED ORDER — SODIUM CHLORIDE 0.9% IV SOLUTION
Freq: Once | INTRAVENOUS | Status: AC
  Administered 2018-12-26: 08:00:00 via INTRAVENOUS

## 2018-12-25 MED ORDER — DEXMEDETOMIDINE HCL IN NACL 400 MCG/100ML IV SOLN
0.4000 ug/kg/h | INTRAVENOUS | Status: DC
  Administered 2018-12-25 (×2): 0.4 ug/kg/h via INTRAVENOUS
  Administered 2018-12-26: 0.2 ug/kg/h via INTRAVENOUS
  Administered 2018-12-26 – 2018-12-27 (×3): 0.4 ug/kg/h via INTRAVENOUS
  Administered 2018-12-28: 0.5 ug/kg/h via INTRAVENOUS
  Administered 2018-12-28 – 2018-12-29 (×2): 0.6 ug/kg/h via INTRAVENOUS
  Administered 2018-12-29: 05:00:00 0.4 ug/kg/h via INTRAVENOUS
  Administered 2018-12-30: 14:00:00 0.8 ug/kg/h via INTRAVENOUS
  Administered 2018-12-30: 23:00:00 1.2 ug/kg/h via INTRAVENOUS
  Administered 2018-12-30: 0.8 ug/kg/h via INTRAVENOUS
  Administered 2018-12-30: 0.6 ug/kg/h via INTRAVENOUS
  Administered 2018-12-30: 20:00:00 1.2 ug/kg/h via INTRAVENOUS
  Administered 2018-12-31: 11:00:00 0.8 ug/kg/h via INTRAVENOUS
  Administered 2018-12-31 (×2): 1.2 ug/kg/h via INTRAVENOUS
  Filled 2018-12-25 (×18): qty 100

## 2018-12-25 MED ORDER — CLONAZEPAM 0.5 MG PO TBDP
0.5000 mg | ORAL_TABLET | Freq: Two times a day (BID) | ORAL | Status: DC
  Administered 2018-12-25 (×2): 0.5 mg via ORAL
  Filled 2018-12-25 (×2): qty 1

## 2018-12-25 MED ORDER — LORAZEPAM 2 MG/ML IJ SOLN
INTRAMUSCULAR | Status: AC
  Administered 2018-12-25: 08:00:00 1 mg via INTRAVENOUS
  Filled 2018-12-25: qty 1

## 2018-12-25 MED ORDER — FUROSEMIDE 10 MG/ML IJ SOLN
80.0000 mg | Freq: Once | INTRAMUSCULAR | Status: AC
  Administered 2018-12-25: 80 mg via INTRAVENOUS
  Filled 2018-12-25: qty 8

## 2018-12-25 MED ORDER — POLYETHYLENE GLYCOL 3350 17 G PO PACK
17.0000 g | PACK | Freq: Every day | ORAL | Status: DC
  Administered 2018-12-25 – 2019-01-05 (×6): 17 g via ORAL
  Filled 2018-12-25 (×11): qty 1

## 2018-12-25 MED ORDER — NITROGLYCERIN 2 % TD OINT
0.5000 [in_us] | TOPICAL_OINTMENT | Freq: Four times a day (QID) | TRANSDERMAL | Status: DC
  Administered 2018-12-25 (×2): 0.5 [in_us] via TOPICAL
  Filled 2018-12-25: qty 30

## 2018-12-25 MED ORDER — LORAZEPAM 2 MG/ML IJ SOLN
1.0000 mg | Freq: Once | INTRAMUSCULAR | Status: AC
  Administered 2018-12-25: 1 mg via INTRAVENOUS

## 2018-12-25 NOTE — Progress Notes (Signed)
NAME:  Joel Greene, MRN:  [id number], DOB:  28-Jan-1951, LOS: 7 ADMISSION DATE:  12/18/2018, CONSULTATION DATE:  6/20 REFERRING MD:  TRH-Singh, CHIEF COMPLAINT:  Dyspnea  Brief History   68 y/o male with a history of bronchitis presented with dyspnea and hypoxemia, initially required intubation on 6/20.  Extubated on 6/22.  Has had worsening hypoxemia.   Past Medical History  Bronchitis Inflammatory bowel syndrome Anxiety Depression Hyperlipidemia GERD  Significant Hospital Events   6/20 intubation for COVID  6/22 extubated 6/27 moved back to ICU, hypoxemic  Consults:  PCCM  Procedures:  6/20 ETT > 6/22  6/21 R arm PICC >   Significant Diagnostic Tests:    Micro Data:  6/20 SARS-COV2> positive 6/20 blood > negative 6/22 c diff > negative   Antimicrobials:  6/20 zosyn x1 6/20 vanc x1 6/21 azithro > 6/23 6/21 ceftriaxone > 6/23 6/20 remdesivir >  6/26 actemra x1 6/26  Convalescent plasma  Interim history/subjective:  Overnight was weaned to 40% FiO2 while asleep Awake this morning> he says that he can't breathe, very anxious, on higher FiO2  Objective   Blood pressure (!) 143/69, pulse (!) 108, temperature 98.4 F (36.9 C), temperature source Oral, resp. rate (!) 45, height 5\' 7"  (1.702 m), weight 78.7 kg, SpO2 (!) 85 %.    FiO2 (%):  [40 %-100 %] 100 %   Intake/Output Summary (Last 24 hours) at 12/25/2018 0912 Last data filed at 12/25/2018 0800 Gross per 24 hour  Intake 240 ml  Output 3200 ml  Net -2960 ml   Filed Weights   12/24/18 0500 12/24/18 0600 12/25/18 0500  Weight: 80.1 kg 80.1 kg 78.7 kg    Examination:  General:  Anxious, resting in bed  HENT: NCAT OP clear PULM: CTA B, increased work of breathing CV: Tachycardia, no mgr GI: BS+, soft, nontender MSK: normal bulk and tone Neuro: anxious, speaking in full sentences   Resolved Hospital Problem list     Assessment & Plan:  ARDS due to SARS-COV2: oxygen needs worsening; He is  incredibly anxious making him more tachypneic.  With tachypnea his oxygen needs increase (while sleeping he only needed 40% FiO2 this morning in ICU, but while awake he needs 100%).  Unclear if his ARDS is worsening or stable.  There could be some component of acute pulmonary edema/diastolic heart failure. > agree with lasix > foley for urinary retention/comfort > start precedex for severe anxiety > continue xanax > continue solumedrol > close monitoring in ICU, may need intubation > continue heated high flow O2 > Tolerate periods of hypoxemia, goal at rest is greater than 85% SaO2, with movement ideally above 75% > Decision for intubation should be based on a change in mental status or physical evidence of ventilatory failure such as nasal flaring, accessory muscle use, paradoxical breathing > Out of bed to chair as able > Prone positioning while in bed  Anxiety/depression > frequent orientation > music therapy (has been very helpful for him) > xanax, precedex, buspar, effexor  Urinary retention: > foley  Best practice:  Diet: npo for now, may need tube feeding if intubated Pain/Anxiety/Delirium protocol (if indicated): n/a VAP protocol (if indicated): n/a DVT prophylaxis: lovenox GI prophylaxis: n/a Glucose control: per TRH Mobility: out of bed today if oxygen needs improve Code Status: full Family Communication: per TRH Disposition: remain in ICU  Labs   CBC: Recent Labs  Lab 12/21/18 0200 12/22/18 0415 12/23/18 0500 12/24/18 0300 12/24/18 2137 12/25/18 0045  WBC 8.4 8.4 8.9 10.6*  --  9.2  NEUTROABS 7.8* 7.9* 8.3* 9.9*  --  8.6*  HGB 11.4* 11.3* 11.9* 12.8* 13.9 13.4  HCT 33.0* 32.9* 34.1* 36.9* 41.0 38.8*  MCV 95.1 96.2 94.7 95.8  --  94.2  PLT 345 336 302 338  --  979    Basic Metabolic Panel: Recent Labs  Lab 12/20/18 0120  12/21/18 0200 12/22/18 0415 12/23/18 0500 12/24/18 0300 12/24/18 2137 12/25/18 0045  NA 142   < > 144 143 139 139 139 138  K  3.2*   < > 3.1* 4.4 3.7 3.9 3.6 4.0  CL 103  --  106 106 105 103  --  100  CO2 29  --  29 27 27 28   --  26  GLUCOSE 202*  --  221* 174* 168* 177*  --  177*  BUN 31*  --  29* 20 20 22   --  24*  CREATININE 0.74  --  0.88 0.80 0.71 0.68  --  0.81  CALCIUM 8.3*  --  8.1* 7.8* 7.7* 7.9*  --  8.0*  MG 2.5*  --  2.2 1.9 1.9 2.2  --  2.2  PHOS 2.9  --  2.2* 4.3  --   --   --   --    < > = values in this interval not displayed.   GFR: Estimated Creatinine Clearance: 80.5 mL/min (by C-G formula based on SCr of 0.81 mg/dL). Recent Labs  Lab 12/18/18 1333 12/18/18 1600 12/19/18 0729 12/20/18 0120 12/21/18 0200 12/22/18 0415 12/23/18 0500 12/24/18 0300 12/25/18 0045  PROCALCITON 1.24  --  1.15 0.90 0.58  --   --   --   --   WBC 6.9  --   --  10.0 8.4 8.4 8.9 10.6* 9.2  LATICACIDVEN 3.5* 2.3*  --   --   --   --   --   --   --     Liver Function Tests: Recent Labs  Lab 12/21/18 0200 12/22/18 0415 12/23/18 0500 12/24/18 0300 12/25/18 0045  AST 80* 38 33 29 34  ALT 107* 79* 67* 56* 54*  ALKPHOS 90 78 83 82 87  BILITOT 0.4 0.8 0.7 0.5 0.9  PROT 5.2* 5.2* 5.2* 5.2* 5.4*  ALBUMIN 2.0* 2.0* 2.0* 2.2* 2.2*   Recent Labs  Lab 12/18/18 1333  LIPASE 51   No results for input(s): AMMONIA in the last 168 hours.  ABG    Component Value Date/Time   PHART 7.629 (HH) 12/24/2018 2137   PCO2ART 27.1 (L) 12/24/2018 2137   PO2ART 47.0 (L) 12/24/2018 2137   HCO3 28.5 (H) 12/24/2018 2137   TCO2 29 12/24/2018 2137   O2SAT 91.0 12/24/2018 2137     Coagulation Profile: No results for input(s): INR, PROTIME in the last 168 hours.  Cardiac Enzymes: Recent Labs  Lab 12/18/18 1333 12/20/18 0120 12/21/18 0200 12/22/18 0415  CKTOTAL 35* 14* 14* 15*  TROPONINI 0.03*  --   --   --     HbA1C: Hgb A1c MFr Bld  Date/Time Value Ref Range Status  12/21/2018 02:00 AM 6.5 (H) 4.8 - 5.6 % Final    Comment:    (NOTE) Pre diabetes:          5.7%-6.4% Diabetes:              >6.4% Glycemic  control for   <7.0% adults with diabetes     CBG: Recent Labs  Lab 12/24/18 1708  12/24/18 2224 12/25/18 0012 12/25/18 0317 12/25/18 0835  GLUCAP 203* 138* 189* 177* 131*    Critical care time: 50 minutes       Roselie Awkward, MD [place] [facility] Pager: [phone] Cell: [phone] If no response, call [phone]

## 2018-12-25 NOTE — Progress Notes (Addendum)
PROGRESS NOTE    Joel Greene  CBJ:[id number] DOB: 1951-03-06 DOA: 12/18/2018 PCP: Redmond School, MD    Brief Narrative:  68 year old male was initially evaluated at a [facility] hospital and transferred to [place] when he tested positive for COVID-19.  He had presented with fevers, chills, shortness of breath.  He became progressively hypoxic and tachypneic.  He was intubated in the emergency room prior to transfer to [facility].  Currently on steroids, remdesivir and antibiotics.  Patient was able to extubate on 6/22.  He has been transitioned to progressive care.  He was transferred to my care on day 5 of his hospital stay on 12/22/2018.   Subjective: Patient in bed currently in ICU on high flow nasal cannula oxygen denies any headache chest pain, is short of breath, some abdominal fullness.  But no pain.  Assessment & Plan:    1. Acute hypoxic respiratory failure due to COVID-19 interstitial pneumonia.  He was started on IV steroids along with IV REMDESIVIR, clinically improved, briefly intubated for 2 days, extubated on 12/20/2018.  He was transferred to my care on 12/22/2018 on high flow nasal cannula oxygen, he continued to be severely hypoxic hence he received Actemra IV on 12/24/2018.  Unfortunately he continues to be hypoxic and currently has been transferred to ICU on 12/25/2018.  I have obtained consent from him and his significant other for cannulation plasma use which I will order on 12/25/2018.  COVID-19 Labs  Recent Labs    12/23/18 0500 12/24/18 0300 12/25/18 0045  DDIMER 3.44* 2.62* 2.02*  FERRITIN 989* 1,083* 1,092*  LDH 369* 376* 423*  CRP 7.8* 8.8* 9.0*    Lab Results  Component Value Date   SARSCOV2NAA POSITIVE (A) 12/18/2018      2. Sepsis secondary to COVID-19.  Blood cultures negative stable procalcitonin.  Monitor.  3. Hypokalemia.  Replaced.  Magnesium is normal.  4. Eessential hypertension.  Placed on PRN hydralazine.  5.  BPH.   On alpha-blocker, had evidence of bladder fullness hence Foley was placed on 12/25/2018.  600 cc of urine removed.  6.  Anxiety and insomnia.  Home medications continued, he also has insomnia and used to taking trazodone and Xanax at night which will be continued.  7. OSA -CPAP at night, currently cannot use CPAP due to his weight positive status, oxygen at night and monitor.  8.  Acute on chronic nonspecific CHF.  No previous echo on file.  Diurese with Lasix IV on 12/24/2018 and monitor.  BNP was elevated.  Outpatient echocardiogram.    DVT prophylaxis: Lovenox Code Status: Full code Family Communication: updated significant other on 6/23 Disposition Plan: Transfer to progressive care   Consultants:   PCCM  Procedures:   Intubation 6/20 > 6/22   Remdesivir 6/20   R-Arm PICC  Actemra 12/24/2018  Foley catheter 12/25/2018  Plasma 12/25/18  I, Lala Lund, MD, consented Subject Joel Greene (male, Date of Birth 16-Dec-1950, 68 y.o.) and with diagnosis of COVID-19, in the [facility] [employer] Research Protocol for [employer] against COVID-19.  The consent took place under following circumstances.   Subject Capacity assessed by this investigator as:  Presence of adequate emotional and mental capacity to consent with normal ability to read and write.  Consent took place in the following setting(s):  In-room, face to face   The following were present for the consent process:  Investigator   A copy of the cover letter and signed  consent document was provided to subject/LAR.  The original signed consent document has been placed in the subject's physical chart and will be scanned into the electronic medical record upon discharge.  Statement of acknowledgement that the following was discussed with the subject/LAR:    1) Discussed the purpose of the research and procedures  2) Discussed risks and benefits and uncertainties of study participation  3) Discussed subject's responsibilities  4) Discussed the measures in place to maintain subject's confidentiality while a participant on the trial  5) Discussed alternatives to study participation.   6) Discussed study participation is voluntary and that the subject's care would not be jeopardized if they declined participation in the study.   7) Discussed freedom to withdraw at any time.   8) All subject/LAR questions were answered to their satisfaction.   9) In case of emergency consent, investigator agreed to discuss with subject/LAR at earliest available opportunity when the subject stabilizes and/or LAR can be located.     Final Investigator Signature  Lala Lund, [place] [id number]   Date: 12/25/2018 and 4:18 PM     Objective: Vitals:   12/25/18 0722 12/25/18 0745 12/25/18 0800 12/25/18 0900  BP:  (!) 148/67 (!) 143/69 136/87  Pulse:  (!) 108    Resp: (!) 32 (!) 28 (!) 45 (!) 31  Temp: 98.4 F (36.9 C)     TempSrc: Oral     SpO2: 90% (!) 88% (!) 85% (!) 88%  Weight:      Height:        Intake/Output Summary (Last 24 hours) at 12/25/2018 0939 Last data filed at 12/25/2018 0800 Gross per 24 hour  Intake 240 ml  Output 3200 ml  Net -2960 ml   Filed Weights   12/24/18 0500 12/24/18 0600 12/25/18 0500  Weight: 80.1 kg 80.1 kg 78.7 kg    Examination:  Awake but in respiratory distress, appears little tired, [place].AT,PERRAL Supple Neck,No JVD, No cervical lymphadenopathy appriciated.  Symmetrical Chest wall movement, Good air movement bilaterally, CTAB RRR,No Gallops, Rubs or new Murmurs, No Parasternal Heave +ve B.Sounds, Abd Soft, No tenderness, No organomegaly appriciated, No rebound - guarding or rigidity. No Cyanosis, Clubbing or edema, No new Rash or bruise   Data Reviewed: I have personally reviewed following labs and imaging studies  CBC: Recent Labs  Lab 12/21/18 0200 12/22/18 0415 12/23/18 0500 12/24/18 0300 12/24/18 2137 12/25/18 0045  WBC 8.4 8.4  8.9 10.6*  --  9.2  NEUTROABS 7.8* 7.9* 8.3* 9.9*  --  8.6*  HGB 11.4* 11.3* 11.9* 12.8* 13.9 13.4  HCT 33.0* 32.9* 34.1* 36.9* 41.0 38.8*  MCV 95.1 96.2 94.7 95.8  --  94.2  PLT 345 336 302 338  --  390   Basic Metabolic Panel: Recent Labs  Lab 12/20/18 0120  12/21/18 0200 12/22/18 0415 12/23/18 0500 12/24/18 0300 12/24/18 2137 12/25/18 0045  NA 142   < > 144 143 139 139 139 138  K 3.2*   < > 3.1* 4.4 3.7 3.9 3.6 4.0  CL 103  --  106 106 105 103  --  100  CO2 29  --  29 27 27 28   --  26  GLUCOSE 202*  --  221* 174* 168* 177*  --  177*  BUN 31*  --  29* 20 20 22   --  24*  CREATININE 0.74  --  0.88 0.80 0.71 0.68  --  0.81  CALCIUM 8.3*  --  8.1* 7.8* 7.7* 7.9*  --  8.0*  MG 2.5*  --  2.2 1.9 1.9 2.2  --  2.2  PHOS 2.9  --  2.2* 4.3  --   --   --   --    < > = values in this interval not displayed.   GFR: Estimated Creatinine Clearance: 80.5 mL/min (by C-G formula based on SCr of 0.81 mg/dL). Liver Function Tests: Recent Labs  Lab 12/21/18 0200 12/22/18 0415 12/23/18 0500 12/24/18 0300 12/25/18 0045  AST 80* 38 33 29 34  ALT 107* 79* 67* 56* 54*  ALKPHOS 90 78 83 82 87  BILITOT 0.4 0.8 0.7 0.5 0.9  PROT 5.2* 5.2* 5.2* 5.2* 5.4*  ALBUMIN 2.0* 2.0* 2.0* 2.2* 2.2*   Recent Labs  Lab 12/18/18 1333  LIPASE 51   No results for input(s): AMMONIA in the last 168 hours. Coagulation Profile: No results for input(s): INR, PROTIME in the last 168 hours. Cardiac Enzymes: Recent Labs  Lab 12/18/18 1333 12/20/18 0120 12/21/18 0200 12/22/18 0415  CKTOTAL 35* 14* 14* 15*  TROPONINI 0.03*  --   --   --    BNP (last 3 results) No results for input(s): PROBNP in the last 8760 hours. HbA1C: No results for input(s): HGBA1C in the last 72 hours. CBG: Recent Labs  Lab 12/24/18 1708 12/24/18 2224 12/25/18 0012 12/25/18 0317 12/25/18 0835  GLUCAP 203* 138* 189* 177* 131*   Lipid Profile: No results for input(s): CHOL, HDL, LDLCALC, TRIG, CHOLHDL, LDLDIRECT in the  last 72 hours. Thyroid Function Tests: No results for input(s): TSH, T4TOTAL, FREET4, T3FREE, THYROIDAB in the last 72 hours. Anemia Panel: Recent Labs    12/24/18 0300 12/25/18 0045  FERRITIN 1,083* 1,092*   Sepsis Labs: Recent Labs  Lab 12/18/18 1333 12/18/18 1600 12/19/18 0729 12/20/18 0120 12/21/18 0200  PROCALCITON 1.24  --  1.15 0.90 0.58  LATICACIDVEN 3.5* 2.3*  --   --   --     Recent Results (from the past 240 hour(s))  SARS Coronavirus 2 (CEPHEID - Performed in [place] hospital lab), Hosp Order     Status: Abnormal   Collection Time: 12/18/18  1:13 PM   Specimen: Nasopharyngeal Swab  Result Value Ref Range Status   SARS Coronavirus 2 POSITIVE (A) NEGATIVE Final    Comment: CRITICAL RESULT CALLED TO, READ BACK BY AND VERIFIED WITH: A. TUTTLE @1426   12/18/18 KAY (NOTE) If result is NEGATIVE SARS-CoV-2 target nucleic acids are NOT DETECTED. The SARS-CoV-2 RNA is generally detectable in upper and lower  respiratory specimens during the acute phase of infection. The lowest  concentration of SARS-CoV-2 viral copies this assay can detect is 250  copies / mL. A negative result does not preclude SARS-CoV-2 infection  and should not be used as the sole basis for treatment or other  patient management decisions.  A negative result may occur with  improper specimen collection / handling, submission of specimen other  than nasopharyngeal swab, presence of viral mutation(s) within the  areas targeted by this assay, and inadequate number of viral copies  (<250 copies / mL). A negative result must be combined with clinical  observations, patient history, and epidemiological information. If result is POSITIVE SARS-CoV-2 target nucleic acids are DETECTED.  The SARS-CoV-2 RNA is generally detectable in upper and lower  respiratory specimens during the acute phase of infection.  Positive  results are indicative of active infection with SARS-CoV-2.  Clinical  correlation  with patient history and other diagnostic information is  necessary to  determine patient infection status.  Positive results do  not rule out bacterial infection or co-infection with other viruses. If result is PRESUMPTIVE POSTIVE SARS-CoV-2 nucleic acids MAY BE PRESENT.   A presumptive positive result was obtained on the submitted specimen  and confirmed on repeat testing.  While 2019 novel coronavirus  (SARS-CoV-2) nucleic acids may be present in the submitted sample  additional confirmatory testing may be necessary for epidemiological  and / or clinical management purposes  to differentiate between  SARS-CoV-2 and other Sarbecovirus currently known to infect humans.  If clinically indicated additional testing with an alternate test  methodology [phone])  is advised. The SARS-CoV-2 RNA is generally  detectable in upper and lower respiratory specimens during the acute  phase of infection. The expected result is Negative. Fact Sheet for Patients:  StrictlyIdeas.no Fact Sheet for Healthcare Providers: BankingDealers.co.za This test is not yet approved or cleared by the [place] FDA and has been authorized for detection and/or diagnosis of SARS-CoV-2 by FDA under an Emergency Use Authorization (EUA).  This EUA will remain in effect (meaning this test can be used) for the duration of the COVID-19 declaration under Section 564(b)(1) of the Act, 21 U.S.C. section 360bbb-3(b)(1), unless the authorization is terminated or revoked sooner. Performed at [facility], [address], [place], [place] [address]   Blood Culture (routine x 2)     Status: None   Collection Time: 12/18/18  1:44 PM   Specimen: Left Antecubital; Blood  Result Value Ref Range Status   Specimen Description   Final    LEFT ANTECUBITAL BOTTLES DRAWN AEROBIC AND ANAEROBIC   Special Requests Blood Culture adequate volume  Final   Culture   Final    NO GROWTH 5 DAYS  Performed at [facility], [address], [place], [place] [address]    Report Status 12/23/2018 FINAL  Final  Blood Culture (routine x 2)     Status: None   Collection Time: 12/18/18  1:45 PM   Specimen: BLOOD RIGHT WRIST  Result Value Ref Range Status   Specimen Description   Final    BLOOD RIGHT WRIST BOTTLES DRAWN AEROBIC AND ANAEROBIC   Special Requests Blood Culture adequate volume  Final   Culture   Final    NO GROWTH 5 DAYS Performed at [facility], [address] [place], [place] [address]    Report Status 12/23/2018 FINAL  Final  MRSA PCR Screening     Status: None   Collection Time: 12/19/18  5:24 AM   Specimen: Nasal Mucosa; Nasopharyngeal  Result Value Ref Range Status   MRSA by PCR NEGATIVE NEGATIVE Final    Comment:        The GeneXpert MRSA Assay (FDA approved for NASAL specimens only), is one component of a comprehensive MRSA colonization surveillance program. It is not intended to diagnose MRSA infection nor to guide or monitor treatment for MRSA infections. Performed at [facility], [place] [address] [place], [place] [address]   C difficile quick scan w PCR reflex     Status: None   Collection Time: 12/20/18 10:02 PM   Specimen: STOOL  Result Value Ref Range Status   C Diff antigen NEGATIVE NEGATIVE Final   C Diff toxin NEGATIVE NEGATIVE Final   C Diff interpretation No C. difficile detected.  Final    Comment: Performed at [facility], [place] [address], [place], [place] [address]         Radiology Studies: No  results found.   Scheduled Meds: . aspirin  81 mg Oral Daily  . atorvastatin  20 mg Oral Daily  . busPIRone  10 mg Oral BID  . Chlorhexidine Gluconate Cloth  6 each Topical Daily  . cholecalciferol  5,000 Units Oral Daily  . enoxaparin (LOVENOX) injection  40 mg Subcutaneous Q12H  . feeding supplement (ENSURE ENLIVE)  237 mL Oral TID BM  . fluticasone  2 spray Each Nare Daily  . gabapentin  300  mg Oral 2 times per day   And  . gabapentin  600 mg Oral QHS  . insulin aspart  0-20 Units Subcutaneous TID WC  . insulin aspart  0-5 Units Subcutaneous QHS  . latanoprost  1 drop Both Eyes QHS  . mouth rinse  15 mL Mouth Rinse BID  . methylPREDNISolone (SOLU-MEDROL) injection  40 mg Intravenous Q12H  . multivitamin with minerals  1 tablet Oral Daily  . nitroGLYCERIN  0.5 inch Topical Q6H  . pantoprazole  40 mg Oral Daily  . saccharomyces boulardii  250 mg Oral BID  . tamsulosin  0.4 mg Oral QPC breakfast  . venlafaxine XR  75 mg Oral Q breakfast   Continuous Infusions: . dexmedetomidine (PRECEDEX) IV infusion       LOS: 7 days   Signature  Lala Lund M.D on 12/25/2018 at 9:39 AM   -  To page go to [email or web address]

## 2018-12-25 NOTE — Progress Notes (Signed)
[facility] PCCM Evening rounds  Calm, breathing comfortably O2 saturation 88-90% on 30 flow, 50% FiO2 (significantly improved compared to earlier today when he was on 100%). CXR images personally reviewed, worsening airspace disease R>L, some cephalization of vasculature and increased vascular pedical width. Will give another dose of lasix Maintain precedex overnight Remain in ICU  Roselie Awkward, MD [place] P[facility] Pager: [phone] Cell: [phone] If no response, call [phone]

## 2018-12-25 NOTE — Progress Notes (Signed)
Updated patients brother Audry Pili at this time via phone.  Answered questions.

## 2018-12-25 NOTE — Progress Notes (Signed)
Patient BP low at this time.  Nitro paste patch removed and will page MD.  Patient remains very drowsy with precedex decreased at this time.

## 2018-12-25 NOTE — Significant Event (Signed)
Rapid response note  Rapid response was called due to patient's increasing work of breathing and O2 sats in the low 80s despite being on HFNC at 15 L.  At bedside, patient was in acute respiratory distress.  He was also unable to cooperate with pronation and was noted to have worsening respiratory distress with conversation.  It was decided to take patient to the ICU for closer monitoring and management with heated high flow. NRB along with HFNC at 15 L showed improved saturations.  ABG done in ICU showed pH 7.629, PCO2 27.1, PO2 47, bicarb 28.5 (this was done shortly after patient arrived at ICU).  However, patient was in less respiratory distress compared to prior to being transferred to ICU.  O2 sat at bedside ranged within 88-91%.

## 2018-12-26 LAB — BPAM FFP
Blood Product Expiration Date: 202007022359
ISSUE DATE / TIME: 202006271855
Unit Type and Rh: 5100

## 2018-12-26 LAB — CBC WITH DIFFERENTIAL/PLATELET
Abs Immature Granulocytes: 0.08 10*3/uL — ABNORMAL HIGH (ref 0.00–0.07)
Basophils Absolute: 0 10*3/uL (ref 0.0–0.1)
Basophils Relative: 0 %
Eosinophils Absolute: 0 10*3/uL (ref 0.0–0.5)
Eosinophils Relative: 0 %
HCT: 39 % (ref 39.0–52.0)
Hemoglobin: 12.8 g/dL — ABNORMAL LOW (ref 13.0–17.0)
Immature Granulocytes: 1 %
Lymphocytes Relative: 3 %
Lymphs Abs: 0.3 10*3/uL — ABNORMAL LOW (ref 0.7–4.0)
MCH: 31.8 pg (ref 26.0–34.0)
MCHC: 32.8 g/dL (ref 30.0–36.0)
MCV: 96.8 fL (ref 80.0–100.0)
Monocytes Absolute: 0.2 10*3/uL (ref 0.1–1.0)
Monocytes Relative: 3 %
Neutro Abs: 7.6 10*3/uL (ref 1.7–7.7)
Neutrophils Relative %: 93 %
Platelets: 370 10*3/uL (ref 150–400)
RBC: 4.03 MIL/uL — ABNORMAL LOW (ref 4.22–5.81)
RDW: 13.3 % (ref 11.5–15.5)
WBC: 8.1 10*3/uL (ref 4.0–10.5)
nRBC: 0 % (ref 0.0–0.2)

## 2018-12-26 LAB — COMPREHENSIVE METABOLIC PANEL
ALT: 49 U/L — ABNORMAL HIGH (ref 0–44)
AST: 29 U/L (ref 15–41)
Albumin: 2.5 g/dL — ABNORMAL LOW (ref 3.5–5.0)
Alkaline Phosphatase: 90 U/L (ref 38–126)
Anion gap: 11 (ref 5–15)
BUN: 39 mg/dL — ABNORMAL HIGH (ref 8–23)
CO2: 29 mmol/L (ref 22–32)
Calcium: 8.3 mg/dL — ABNORMAL LOW (ref 8.9–10.3)
Chloride: 101 mmol/L (ref 98–111)
Creatinine, Ser: 0.92 mg/dL (ref 0.61–1.24)
GFR calc Af Amer: >60 mL/min (ref >60)
GFR calc non Af Amer: >60 mL/min (ref >60)
Glucose, Bld: 223 mg/dL — ABNORMAL HIGH (ref 70–99)
Potassium: 3.8 mmol/L (ref 3.5–5.1)
Sodium: 141 mmol/L (ref 135–145)
Total Bilirubin: 0.9 mg/dL (ref 0.3–1.2)
Total Protein: 5.7 g/dL — ABNORMAL LOW (ref 6.5–8.1)

## 2018-12-26 LAB — D-DIMER, QUANTITATIVE: D-Dimer, Quant: 1.82 ug/mL-FEU — ABNORMAL HIGH (ref 0.00–0.50)

## 2018-12-26 LAB — GLUCOSE, CAPILLARY
Glucose-Capillary: 184 mg/dL — ABNORMAL HIGH (ref 70–99)
Glucose-Capillary: 167 mg/dL — ABNORMAL HIGH (ref 70–99)
Glucose-Capillary: 253 mg/dL — ABNORMAL HIGH (ref 70–99)
Glucose-Capillary: 245 mg/dL — ABNORMAL HIGH (ref 70–99)
Glucose-Capillary: 278 mg/dL — ABNORMAL HIGH (ref 70–99)

## 2018-12-26 LAB — PREPARE FRESH FROZEN PLASMA: Unit division: 0

## 2018-12-26 LAB — MAGNESIUM: Magnesium: 2.6 mg/dL — ABNORMAL HIGH (ref 1.7–2.4)

## 2018-12-26 LAB — LACTATE DEHYDROGENASE: LDH: 353 U/L — ABNORMAL HIGH (ref 98–192)

## 2018-12-26 LAB — C-REACTIVE PROTEIN: CRP: 4 mg/dL — ABNORMAL HIGH (ref <1.0)

## 2018-12-26 LAB — FERRITIN: Ferritin: 1789 ng/mL — ABNORMAL HIGH (ref 24–336)

## 2018-12-26 LAB — BRAIN NATRIURETIC PEPTIDE: B Natriuretic Peptide: 60 pg/mL (ref 0.0–100.0)

## 2018-12-26 MED ORDER — FUROSEMIDE 10 MG/ML IJ SOLN
40.0000 mg | Freq: Once | INTRAMUSCULAR | Status: AC
  Administered 2018-12-26: 40 mg via INTRAVENOUS
  Filled 2018-12-26: qty 4

## 2018-12-26 MED ORDER — CLONAZEPAM 0.5 MG PO TBDP
1.0000 mg | ORAL_TABLET | Freq: Three times a day (TID) | ORAL | Status: DC
  Administered 2018-12-26 – 2018-12-29 (×12): 1 mg via ORAL
  Filled 2018-12-26 (×13): qty 2

## 2018-12-26 MED ORDER — LORAZEPAM 2 MG/ML IJ SOLN
0.5000 mg | Freq: Four times a day (QID) | INTRAMUSCULAR | Status: DC | PRN
  Administered 2018-12-26 (×2): 0.5 mg via INTRAVENOUS
  Filled 2018-12-26 (×2): qty 1

## 2018-12-26 NOTE — Progress Notes (Signed)
NAME:  Joel Greene, MRN:  [id number], DOB:  1950/08/21, LOS: 8 ADMISSION DATE:  12/18/2018, CONSULTATION DATE:  6/20 REFERRING MD:  TRH-Singh, CHIEF COMPLAINT:  Dyspnea  Brief History   68 y/o male with a history of bronchitis presented with dyspnea and hypoxemia, initially required intubation on 6/20.  Extubated on 6/22.  Has had worsening hypoxemia.   Past Medical History  Bronchitis Inflammatory bowel syndrome Anxiety Depression Hyperlipidemia GERD  Significant Hospital Events   6/20 intubation for COVID  6/22 extubated 6/27 moved back to ICU, hypoxemic  Consults:  PCCM  Procedures:  6/20 ETT > 6/22  6/21 R arm PICC >   Significant Diagnostic Tests:    Micro Data:  6/20 SARS-COV2> positive 6/20 blood > negative 6/22 c diff > negative   Antimicrobials:  6/20 zosyn x1 6/20 vanc x1 6/21 azithro > 6/23 6/21 ceftriaxone > 6/23 6/20 remdesivir >  6/26 actemra x1 6/26  Convalescent plasma  Interim history/subjective:   Slept well Minimal anxiety on Precedex infusion Oxygenation improving No complaints this morning  Objective   Blood pressure 111/70, pulse 74, temperature 97.9 F (36.6 C), temperature source Axillary, resp. rate (!) 25, height 5\' 7"  (1.702 m), weight 78.7 kg, SpO2 94 %.    FiO2 (%):  [50 %-100 %] 50 %   Intake/Output Summary (Last 24 hours) at 12/26/2018 0746 Last data filed at 12/26/2018 0700 Gross per 24 hour  Intake 629.47 ml  Output 2970 ml  Net -2340.53 ml   Filed Weights   12/24/18 0500 12/24/18 0600 12/25/18 0500  Weight: 80.1 kg 80.1 kg 78.7 kg    Examination:  General:  Resting comfortably in bed HENT: NCAT OP clear PULM: CTA B, normal effort CV: RRR, no mgr GI: BS+, soft, nontender MSK: normal bulk and tone Neuro: drowsy but easily wakes to voice, alert, no distress, MAEW   Resolved Hospital Problem list     Assessment & Plan:  ARDS due to SARS-COV2: Oxygenation and oxygen needs worse in setting of panic and  anxiety, improved with Precedex Possibly acute diastolic heart failure Continue Precedex infusion today, try to wean off Increase clonazepam Continue to administer oxygen via heated high flow Continue to monitor carefully in the intensive care unit Tolerate periods of hypoxemia, goal at rest is greater than 85% SaO2, with movement ideally above 75% Decision for intubation should be based on a change in mental status or physical evidence of ventilatory failure such as nasal flaring, accessory muscle use, paradoxical breathing Out of bed to chair as able Prone positioning while in bed Consider echo   Anxiety/depression Frequent orientation Continue music therapy Increase clonazepam Try to wean off Precedex Continue BuSpar, Effexor  Urinary retention: Continue Foley today Continue flomax  Best practice:  Diet: npo for now, may need tube feeding if intubated Pain/Anxiety/Delirium protocol (if indicated): n/a VAP protocol (if indicated): n/a DVT prophylaxis: lovenox GI prophylaxis: n/a Glucose control: per TRH Mobility: out of bed today Code Status: full Family Communication: per [facility] Disposition: remain in ICU  Labs   CBC: Recent Labs  Lab 12/22/18 0415 12/23/18 0500 12/24/18 0300 12/24/18 2137 12/25/18 0045 12/26/18 0145  WBC 8.4 8.9 10.6*  --  9.2 8.1  NEUTROABS 7.9* 8.3* 9.9*  --  8.6* 7.6  HGB 11.3* 11.9* 12.8* 13.9 13.4 12.8*  HCT 32.9* 34.1* 36.9* 41.0 38.8* 39.0  MCV 96.2 94.7 95.8  --  94.2 96.8  PLT 336 302 338  --  363 370  Basic Metabolic Panel: Recent Labs  Lab 12/20/18 0120  12/21/18 0200 12/22/18 0415 12/23/18 0500 12/24/18 0300 12/24/18 2137 12/25/18 0045 12/26/18 0145  NA 142   < > 144 143 139 139 139 138 141  K 3.2*   < > 3.1* 4.4 3.7 3.9 3.6 4.0 3.8  CL 103  --  106 106 105 103  --  100 101  CO2 29  --  29 27 27 28   --  26 29  GLUCOSE 202*  --  221* 174* 168* 177*  --  177* 223*  BUN 31*  --  29* 20 20 22   --  24* 39*  CREATININE  0.74  --  0.88 0.80 0.71 0.68  --  0.81 0.92  CALCIUM 8.3*  --  8.1* 7.8* 7.7* 7.9*  --  8.0* 8.3*  MG 2.5*  --  2.2 1.9 1.9 2.2  --  2.2 2.6*  PHOS 2.9  --  2.2* 4.3  --   --   --   --   --    < > = values in this interval not displayed.   GFR: Estimated Creatinine Clearance: 70.8 mL/min (by C-G formula based on SCr of 0.92 mg/dL). Recent Labs  Lab 12/20/18 0120 12/21/18 0200  12/23/18 0500 12/24/18 0300 12/25/18 0045 12/26/18 0145  PROCALCITON 0.90 0.58  --   --   --   --   --   WBC 10.0 8.4   < > 8.9 10.6* 9.2 8.1   < > = values in this interval not displayed.    Liver Function Tests: Recent Labs  Lab 12/22/18 0415 12/23/18 0500 12/24/18 0300 12/25/18 0045 12/26/18 0145  AST 38 33 29 34 29  ALT 79* 67* 56* 54* 49*  ALKPHOS 78 83 82 87 90  BILITOT 0.8 0.7 0.5 0.9 0.9  PROT 5.2* 5.2* 5.2* 5.4* 5.7*  ALBUMIN 2.0* 2.0* 2.2* 2.2* 2.5*   No results for input(s): LIPASE, AMYLASE in the last 168 hours. No results for input(s): AMMONIA in the last 168 hours.  ABG    Component Value Date/Time   PHART 7.629 (HH) 12/24/2018 2137   PCO2ART 27.1 (L) 12/24/2018 2137   PO2ART 47.0 (L) 12/24/2018 2137   HCO3 28.5 (H) 12/24/2018 2137   TCO2 29 12/24/2018 2137   O2SAT 91.0 12/24/2018 2137     Coagulation Profile: No results for input(s): INR, PROTIME in the last 168 hours.  Cardiac Enzymes: Recent Labs  Lab 12/20/18 0120 12/21/18 0200 12/22/18 0415  CKTOTAL 14* 14* 15*    HbA1C: Hgb A1c MFr Bld  Date/Time Value Ref Range Status  12/21/2018 02:00 AM 6.5 (H) 4.8 - 5.6 % Final    Comment:    (NOTE) Pre diabetes:          5.7%-6.4% Diabetes:              >6.4% Glycemic control for   <7.0% adults with diabetes     CBG: Recent Labs  Lab 12/25/18 1545 12/25/18 1935 12/25/18 2206 12/25/18 2344 12/26/18 0457  GLUCAP 221* 151* 144* 207* 184*    Critical care time: 32 minutes       Roselie Awkward, MD [place] P[facility]M Pager: [phone] Cell: [phone]  If no response, call [phone]

## 2018-12-26 NOTE — Progress Notes (Signed)
RT called to patient's room due to desat. Patient in anxious, has increased WOB, RN at bedside working with patient to breath slowly and deeply.  FIO2 increased to 100%, and 40L. Sats slowly recovering. RT will continue to monitor.

## 2018-12-26 NOTE — Progress Notes (Signed)
Patient called his nephew and talked with him via hospital cell phone.

## 2018-12-26 NOTE — Progress Notes (Signed)
Patient is much calmer now, watching a movie. RT instructed him on the use of incentive spirometer. Patient was able to reach 750 mL with it. RT will continue to monitor.

## 2018-12-26 NOTE — Progress Notes (Signed)
spoke with rose- pt significant other and POA. Gave update regarding [place]. Also enabled speaker on iphone to allow free flow of conversation. This was much appreciated by both parties.

## 2018-12-26 NOTE — Progress Notes (Signed)
OT Cancellation Note  Patient Details Name: Joel Greene MRN: [id number] DOB: 1951/01/12   Cancelled Treatment:    Reason Eval/Treat Not Completed: Medical issues which prohibited therapy(Pt on 30L O2 and destats with conversation. Will hold and return as schedule allows. Thank you.)  S[place], OTR/L Acute Rehab Pager: [phone] Office: [phone] 12/26/2018, 12:41 PM

## 2018-12-26 NOTE — Progress Notes (Signed)
PROGRESS NOTE    Joel Greene  XNA:[id number] DOB: 1950-11-18 DOA: 12/18/2018 PCP: Redmond School, MD    Brief Narrative:  68 year old male was initially evaluated at a [facility] hospital and transferred to [place] when he tested positive for COVID-19.  He had presented with fevers, chills, shortness of breath.  He became progressively hypoxic and tachypneic.  He was intubated in the emergency room prior to transfer to [facility].  Currently on steroids, remdesivir and antibiotics.  Patient was able to extubate on 6/22.  He has been transitioned to progressive care.  He was transferred to my care on day 5 of his hospital stay on 12/22/2018.   Subjective: Patient in bed, appears comfortable, denies any headache, no fever, no chest pain or pressure, improved shortness of breath , no abdominal pain. No focal weakness.   Assessment & Plan:    1. Acute hypoxic respiratory failure due to COVID-19 interstitial pneumonia.  He was started on IV steroids along with IV REMDESIVIR, clinically improved, briefly intubated for 2 days, extubated on 12/20/2018.  He was transferred to my care on 12/22/2018 on high flow nasal cannula oxygen, he continued to be severely hypoxic hence he received Actemra IV on 12/24/2018.  Continues to have profound hypoxia requiring high flow nasal cannula oxygen in ICU, monitor closely, PCCM help appreciated, he usually does much better once he is calm and anxiety is in good control.  COVID-19 Labs  Recent Labs    12/24/18 0300 12/25/18 0045 12/26/18 0145  DDIMER 2.62* 2.02* 1.82*  FERRITIN 1,083* 1,092* 1,789*  LDH 376* 423* 353*  CRP 8.8* 9.0* 4.0*    Lab Results  Component Value Date   SARSCOV2NAA POSITIVE (A) 12/18/2018      2. Sepsis secondary to COVID-19.  Blood cultures negative stable procalcitonin.  Monitor.  3. Hypokalemia.  Replaced.  Magnesium is normal.  4. Eessential hypertension.  Placed on PRN hydralazine.  5.  BPH.  On  alpha-blocker, had evidence of bladder fullness hence Foley was placed on 12/25/2018.  600 cc of urine removed.  6.  Anxiety and insomnia.  Home medications continued, he has been placed on high-dose clonidine, continue trazodone, continue BuSpar along with Neurontin, PRN IV Ativan, also requiring intermittent Precedex drip which seemed to help him quite a bit in terms of his anxiety.  7. OSA -CPAP at night, and using oxygen high flow.  8.  Acute on chronic nonspecific CHF.  No previous echo on file.  Diuresed with high dose Lasix IV on 12/24/2018 will continue monitor, low dose Lasix x 1 on 6/27.  Outpatient echocardiogram.    DVT prophylaxis: Lovenox Code Status: Full code Family Communication: updated significant other on 6/23 Disposition Plan: Transfer to progressive care   Consultants:   PCCM  Procedures:   Intubation 6/20 > 6/22   Remdesivir 6/20   R-Arm PICC  Actemra 12/24/2018  Foley catheter 12/25/2018  Plasma 12/25/18  I, Lala Lund, MD, consented Subject Joel Greene (male, Date of Birth [birth year], 68 y.o.) and with diagnosis of COVID-19, in the [facility] [employer] Research Protocol for [employer] against COVID-19.  The consent took place under following circumstances.   Subject Capacity assessed by this investigator as:  Presence of adequate emotional and mental capacity to consent with normal ability to read and write.  Consent took place in the following setting(s):  In-room, face to face   The following were present for the consent process:  Investigator  A copy of the cover letter and signed consent document was provided to subject/LAR.  The original signed consent document has been placed in the subject's physical chart and will be scanned into the electronic medical record upon discharge.  Statement of acknowledgement that the following was discussed with the subject/LAR:    1) Discussed the purpose of the research  and procedures  2) Discussed risks and benefits and uncertainties of study participation 3) Discussed subjects responsibilities  4) Discussed the measures in place to maintain subjects confidentiality while a participant on the trial  5) Discussed alternatives to study participation.   6) Discussed study participation is voluntary and that the subject's care would not be jeopardized if they declined participation in the study.   7) Discussed freedom to withdraw at any time.   8) All subject/LAR questions were answered to their satisfaction.   9) In case of emergency consent, investigator agreed to discuss with subject/LAR at earliest available opportunity when the subject stabilizes and/or LAR can be located.     Final Investigator Signature  [place], [place] [phone]   Date: 12/26/2018 and 10:01 AM     Objective: Vitals:   12/26/18 0600 12/26/18 0700 12/26/18 0714 12/26/18 0939  BP: 116/69 111/70 111/70 (!) 151/74  Pulse:   65 97  Resp: (!) 31 (!) 25 (!) 28 (!) 29  Temp:      TempSrc:      SpO2: 93% 94% 93% (!) 84%  Weight:      Height:        Intake/Output Summary (Last 24 hours) at 12/26/2018 1001 Last data filed at 12/26/2018 0700 Gross per 24 hour  Intake 579.47 ml  Output 1670 ml  Net -1090.53 ml   Filed Weights   12/24/18 0500 12/24/18 0600 12/25/18 0500  Weight: 80.1 kg 80.1 kg 78.7 kg    Examination:  Awake Alert,   No new F.N deficits, less anxious affect [place].AT,PERRAL Supple Neck,No JVD, No cervical lymphadenopathy appriciated.  Symmetrical Chest wall movement, Good air movement bilaterally, CTAB RRR,No Gallops, Rubs or new Murmurs, No Parasternal Heave +ve B.Sounds, Abd Soft, No tenderness, No organomegaly appriciated, No rebound - guarding or rigidity. No Cyanosis, Clubbing or edema, No new Rash or bruise  Data Reviewed: I have personally reviewed following labs and imaging studies  CBC: Recent Labs  Lab 12/22/18 0415 12/23/18 0500  12/24/18 0300 12/24/18 2137 12/25/18 0045 12/26/18 0145  WBC 8.4 8.9 10.6*  --  9.2 8.1  NEUTROABS 7.9* 8.3* 9.9*  --  8.6* 7.6  HGB 11.3* 11.9* 12.8* 13.9 13.4 12.8*  HCT 32.9* 34.1* 36.9* 41.0 38.8* 39.0  MCV 96.2 94.7 95.8  --  94.2 96.8  PLT 336 302 338  --  363 270   Basic Metabolic Panel: Recent Labs  Lab 12/20/18 0120  12/21/18 0200 12/22/18 0415 12/23/18 0500 12/24/18 0300 12/24/18 2137 12/25/18 0045 12/26/18 0145  NA 142   < > 144 143 139 139 139 138 141  K 3.2*   < > 3.1* 4.4 3.7 3.9 3.6 4.0 3.8  CL 103  --  106 106 105 103  --  100 101  CO2 29  --  29 27 27 28   --  26 29  GLUCOSE 202*  --  221* 174* 168* 177*  --  177* 223*  BUN 31*  --  29* 20 20 22   --  24* 39*  CREATININE 0.74  --  0.88 0.80 0.71 0.68  --  0.81  0.92  CALCIUM 8.3*  --  8.1* 7.8* 7.7* 7.9*  --  8.0* 8.3*  MG 2.5*  --  2.2 1.9 1.9 2.2  --  2.2 2.6*  PHOS 2.9  --  2.2* 4.3  --   --   --   --   --    < > = values in this interval not displayed.   GFR: Estimated Creatinine Clearance: 70.8 mL/min (by C-G formula based on SCr of 0.92 mg/dL). Liver Function Tests: Recent Labs  Lab 12/22/18 0415 12/23/18 0500 12/24/18 0300 12/25/18 0045 12/26/18 0145  AST 38 33 29 34 29  ALT 79* 67* 56* 54* 49*  ALKPHOS 78 83 82 87 90  BILITOT 0.8 0.7 0.5 0.9 0.9  PROT 5.2* 5.2* 5.2* 5.4* 5.7*  ALBUMIN 2.0* 2.0* 2.2* 2.2* 2.5*   No results for input(s): LIPASE, AMYLASE in the last 168 hours. No results for input(s): AMMONIA in the last 168 hours. Coagulation Profile: No results for input(s): INR, PROTIME in the last 168 hours. Cardiac Enzymes: Recent Labs  Lab 12/20/18 0120 12/21/18 0200 12/22/18 0415  CKTOTAL 14* 14* 15*   BNP (last 3 results) No results for input(s): PROBNP in the last 8760 hours. HbA1C: No results for input(s): HGBA1C in the last 72 hours. CBG: Recent Labs  Lab 12/25/18 1935 12/25/18 2206 12/25/18 2344 12/26/18 0457 12/26/18 0821  GLUCAP 151* 144* 207* 184* 167*    Lipid Profile: No results for input(s): CHOL, HDL, LDLCALC, TRIG, CHOLHDL, LDLDIRECT in the last 72 hours. Thyroid Function Tests: No results for input(s): TSH, T4TOTAL, FREET4, T3FREE, THYROIDAB in the last 72 hours. Anemia Panel: Recent Labs    12/25/18 0045 12/26/18 0145  FERRITIN 1,092* 1,789*   Sepsis Labs: Recent Labs  Lab 12/20/18 0120 12/21/18 0200  PROCALCITON 0.90 0.58    Recent Results (from the past 240 hour(s))  SARS Coronavirus 2 (CEPHEID - Performed in [facility] hospital lab), Hosp Order     Status: Abnormal   Collection Time: 12/18/18  1:13 PM   Specimen: Nasopharyngeal Swab  Result Value Ref Range Status   SARS Coronavirus 2 POSITIVE (A) NEGATIVE Final    Comment: CRITICAL RESULT CALLED TO, READ BACK BY AND VERIFIED WITH: A. TUTTLE @1426   12/18/18 KAY (NOTE) If result is NEGATIVE SARS-CoV-2 target nucleic acids are NOT DETECTED. The SARS-CoV-2 RNA is generally detectable in upper and lower  respiratory specimens during the acute phase of infection. The lowest  concentration of SARS-CoV-2 viral copies this assay can detect is 250  copies / mL. A negative result does not preclude SARS-CoV-2 infection  and should not be used as the sole basis for treatment or other  patient management decisions.  A negative result may occur with  improper specimen collection / handling, submission of specimen other  than nasopharyngeal swab, presence of viral mutation(s) within the  areas targeted by this assay, and inadequate number of viral copies  (<250 copies / mL). A negative result must be combined with clinical  observations, patient history, and epidemiological information. If result is POSITIVE SARS-CoV-2 target nucleic acids are DETECTED.  The SARS-CoV-2 RNA is generally detectable in upper and lower  respiratory specimens during the acute phase of infection.  Positive  results are indicative of active infection with SARS-CoV-2.  Clinical  correlation with  patient history and other diagnostic information is  necessary to determine patient infection status.  Positive results do  not rule out bacterial infection or co-infection with other viruses. If  result is PRESUMPTIVE POSTIVE SARS-CoV-2 nucleic acids MAY BE PRESENT.   A presumptive positive result was obtained on the submitted specimen  and confirmed on repeat testing.  While 2019 novel coronavirus  (SARS-CoV-2) nucleic acids may be present in the submitted sample  additional confirmatory testing may be necessary for epidemiological  and / or clinical management purposes  to differentiate between  SARS-CoV-2 and other Sarbecovirus currently known to infect humans.  If clinically indicated additional testing with an alternate test  methodology [phone])  is advised. The SARS-CoV-2 RNA is generally  detectable in upper and lower respiratory specimens during the acute  phase of infection. The expected result is Negative. Fact Sheet for Patients:  StrictlyIdeas.no Fact Sheet for Healthcare Providers: BankingDealers.co.za This test is not yet approved or cleared by the [place] FDA and has been authorized for detection and/or diagnosis of SARS-CoV-2 by FDA under an Emergency Use Authorization (EUA).  This EUA will remain in effect (meaning this test can be used) for the duration of the COVID-19 declaration under Section 564(b)(1) of the Act, 21 U.S.C. section 360bbb-3(b)(1), unless the authorization is terminated or revoked sooner. Performed at [facility], [address], [place], [place] [address]   Blood Culture (routine x 2)     Status: None   Collection Time: 12/18/18  1:44 PM   Specimen: Left Antecubital; Blood  Result Value Ref Range Status   Specimen Description   Final    LEFT ANTECUBITAL BOTTLES DRAWN AEROBIC AND ANAEROBIC   Special Requests Blood Culture adequate volume  Final   Culture   Final    NO GROWTH 5  DAYS Performed at [facility], [address], [place], [place] [address]    Report Status 12/23/2018 FINAL  Final  Blood Culture (routine x 2)     Status: None   Collection Time: 12/18/18  1:45 PM   Specimen: BLOOD RIGHT WRIST  Result Value Ref Range Status   Specimen Description   Final    BLOOD RIGHT WRIST BOTTLES DRAWN AEROBIC AND ANAEROBIC   Special Requests Blood Culture adequate volume  Final   Culture   Final    NO GROWTH 5 DAYS Performed at [facility], [address], [place], [place] [address]    Report Status 12/23/2018 FINAL  Final  MRSA PCR Screening     Status: None   Collection Time: 12/19/18  5:24 AM   Specimen: Nasal Mucosa; Nasopharyngeal  Result Value Ref Range Status   MRSA by PCR NEGATIVE NEGATIVE Final    Comment:        The GeneXpert MRSA Assay (FDA approved for NASAL specimens only), is one component of a comprehensive MRSA colonization surveillance program. It is not intended to diagnose MRSA infection nor to guide or monitor treatment for MRSA infections. Performed at [facility], [place] [address], [place], [place] [address]   C difficile quick scan w PCR reflex     Status: None   Collection Time: 12/20/18 10:02 PM   Specimen: STOOL  Result Value Ref Range Status   C Diff antigen NEGATIVE NEGATIVE Final   C Diff toxin NEGATIVE NEGATIVE Final   C Diff interpretation No C. difficile detected.  Final    Comment: Performed at [facility], [place] [address] [place], [place] [address]         Radiology Studies: Dg Chest Port 1 View  Result Date: 12/25/2018 CLINICAL DATA:  Status postextubation.  Hypoxia. EXAM: PORTABLE CHEST 1 VIEW  COMPARISON:  December 20, 2018 FINDINGS: Endotracheal tube and nasogastric tube have been removed. Central catheter tip is in the superior vena cava. No pneumothorax. There is airspace opacity in both lower lobes with consolidation medially in both lung bases, progressed compared to most  recent study. More patchy infiltrate in the right upper lobe is essentially stable. Left upper lobe is now clear. There is cardiomegaly with pulmonary vascularity normal. No adenopathy. No bone lesions. IMPRESSION: Progression of airspace opacity in each lower lobe. Stable more subtle opacity right upper lobe. Left upper lobe now clear. Stable cardiomegaly. No adenopathy evident. Central catheter tip in superior vena cava.  No pneumothorax. Electronically Signed   By: Lowella Grip III M.D.   On: 12/25/2018 10:30     Scheduled Meds:  sodium chloride   Intravenous Once   aspirin  81 mg Oral Daily   atorvastatin  20 mg Oral Daily   busPIRone  10 mg Oral BID   Chlorhexidine Gluconate Cloth  6 each Topical Daily   cholecalciferol  5,000 Units Oral Daily   clonazepam  1 mg Oral TID   enoxaparin (LOVENOX) injection  40 mg Subcutaneous Q12H   feeding supplement (ENSURE ENLIVE)  237 mL Oral TID BM   fluticasone  2 spray Each Nare Daily   gabapentin  300 mg Oral 2 times per day   And   gabapentin  600 mg Oral QHS   insulin aspart  0-20 Units Subcutaneous TID WC   insulin aspart  0-5 Units Subcutaneous QHS   latanoprost  1 drop Both Eyes QHS   mouth rinse  15 mL Mouth Rinse BID   methylPREDNISolone (SOLU-MEDROL) injection  60 mg Intravenous Q12H   multivitamin with minerals  1 tablet Oral Daily   pantoprazole  40 mg Oral Daily   polyethylene glycol  17 g Oral Daily   saccharomyces boulardii  250 mg Oral BID   tamsulosin  0.4 mg Oral QPC breakfast   venlafaxine XR  75 mg Oral Q breakfast   Continuous Infusions:  dexmedetomidine (PRECEDEX) IV infusion 0.4 mcg/kg/hr (12/26/18 0700)     LOS: 8 days   Signature  Lala Lund M.D on 12/26/2018 at 10:01 AM   -  To page go to [email or web address]

## 2018-12-26 NOTE — Progress Notes (Signed)
Spoke with Significant other on the phone updated and answered questions that she had.

## 2018-12-27 ENCOUNTER — Inpatient Hospital Stay (HOSPITAL_COMMUNITY): Payer: Medicare Other

## 2018-12-27 LAB — CBC WITH DIFFERENTIAL/PLATELET
Abs Immature Granulocytes: 0.11 10*3/uL — ABNORMAL HIGH (ref 0.00–0.07)
Basophils Absolute: 0 10*3/uL (ref 0.0–0.1)
Basophils Relative: 0 %
Eosinophils Absolute: 0 10*3/uL (ref 0.0–0.5)
Eosinophils Relative: 0 %
HCT: 42.1 % (ref 39.0–52.0)
Hemoglobin: 14.1 g/dL (ref 13.0–17.0)
Immature Granulocytes: 1 %
Lymphocytes Relative: 4 %
Lymphs Abs: 0.5 10*3/uL — ABNORMAL LOW (ref 0.7–4.0)
MCH: 32.3 pg (ref 26.0–34.0)
MCHC: 33.5 g/dL (ref 30.0–36.0)
MCV: 96.6 fL (ref 80.0–100.0)
Monocytes Absolute: 0.3 10*3/uL (ref 0.1–1.0)
Monocytes Relative: 3 %
Neutro Abs: 10.8 10*3/uL — ABNORMAL HIGH (ref 1.7–7.7)
Neutrophils Relative %: 92 %
Platelets: 368 10*3/uL (ref 150–400)
RBC: 4.36 MIL/uL (ref 4.22–5.81)
RDW: 12.7 % (ref 11.5–15.5)
WBC: 11.7 10*3/uL — ABNORMAL HIGH (ref 4.0–10.5)
nRBC: 0 % (ref 0.0–0.2)

## 2018-12-27 LAB — ABO/RH: ABO/RH(D): O NEG

## 2018-12-27 LAB — BASIC METABOLIC PANEL
Anion gap: 12 (ref 5–15)
BUN: 34 mg/dL — ABNORMAL HIGH (ref 8–23)
CO2: 24 mmol/L (ref 22–32)
Calcium: 8.7 mg/dL — ABNORMAL LOW (ref 8.9–10.3)
Chloride: 103 mmol/L (ref 98–111)
Creatinine, Ser: 0.82 mg/dL (ref 0.61–1.24)
GFR calc Af Amer: >60 mL/min (ref >60)
GFR calc non Af Amer: >60 mL/min (ref >60)
Glucose, Bld: 265 mg/dL — ABNORMAL HIGH (ref 70–99)
Potassium: 4.1 mmol/L (ref 3.5–5.1)
Sodium: 139 mmol/L (ref 135–145)

## 2018-12-27 LAB — GLUCOSE, CAPILLARY
Glucose-Capillary: 227 mg/dL — ABNORMAL HIGH (ref 70–99)
Glucose-Capillary: 132 mg/dL — ABNORMAL HIGH (ref 70–99)
Glucose-Capillary: 272 mg/dL — ABNORMAL HIGH (ref 70–99)

## 2018-12-27 LAB — MAGNESIUM: Magnesium: 2.3 mg/dL (ref 1.7–2.4)

## 2018-12-27 LAB — LACTATE DEHYDROGENASE: LDH: 289 U/L — ABNORMAL HIGH (ref 98–192)

## 2018-12-27 NOTE — Progress Notes (Addendum)
PROGRESS NOTE    Joel Greene  FXT:[id number] DOB: [birth year] DOA: 12/18/2018 PCP: Redmond School, MD    Brief Narrative:  67 year old male was initially evaluated at a [facility] hospital and transferred to [place] when he tested positive for COVID-19.  He had presented with fevers, chills, shortness of breath.  He became progressively hypoxic and tachypneic.  He was intubated in the emergency room prior to transfer to [facility].  Currently on steroids, remdesivir and antibiotics.  Patient was able to extubate on 6/22.  He has been transitioned to progressive care.  He was transferred to my care on day 5 of his hospital stay on 12/22/2018.   Subjective: Patient in bed, appears comfortable, denies any headache, no fever, no chest pain or pressure, improved shortness of breath , no abdominal pain. No focal weakness.  Assessment & Plan:    1. Acute hypoxic respiratory failure due to COVID-19 interstitial pneumonia.  He was started on IV steroids along with IV REMDESIVIR, clinically improved, briefly intubated for 2 days, extubated on 12/20/2018.  He was transferred to my care on 12/22/2018 on high flow nasal cannula oxygen, he continued to be severely hypoxic hence he received Actemra IV on 12/24/2018.  Continues to have profound hypoxia requiring high flow nasal cannula oxygen in ICU, monitor closely, PCCM help appreciated, he usually does much better once he is calm and anxiety is in good control.  COVID-19 Labs  Recent Labs    12/25/18 0045 12/26/18 0145 12/27/18 0030  DDIMER 2.02* 1.82*  --   FERRITIN 1,092* 1,789*  --   LDH 423* 353* 289*  CRP 9.0* 4.0*  --     Lab Results  Component Value Date   SARSCOV2NAA POSITIVE (A) 12/18/2018      2. Sepsis secondary to COVID-19.  Blood cultures negative stable procalcitonin.  Monitor.  3. Hypokalemia.  Replaced.  Magnesium is normal.  4. Eessential hypertension.  Placed on PRN hydralazine.  5.  BPH.  On  alpha-blocker, had evidence of bladder fullness hence Foley was placed on 12/25/2018.  600 cc of urine removed.  6.  Anxiety and insomnia.  Home medications continued, he has been placed on high-dose clonidine, continue trazodone, continue BuSpar along with Neurontin, PRN IV Ativan, also requiring intermittent Precedex drip which seemed to help him quite a bit in terms of his anxiety.  7. OSA -CPAP at night, and using oxygen high flow.  8.  Acute on chronic nonspecific CHF.  No previous echo on file.  Diuresed with high dose Lasix IV on 12/24/2018 will continue monitor, low dose Lasix x 1 on 6/27.  Outpatient echocardiogram.  9.  Constipation.  Placed on bowel regimen.    DVT prophylaxis: Lovenox Code Status: Full code Family Communication: updated significant other on 6/23, 6/29 Disposition Plan: Transfer to progressive care   Consultants:   PCCM  Procedures:   Intubation 6/20 > 6/22   Remdesivir 6/20   R-Arm PICC  Actemra 12/24/2018  Foley catheter 12/25/2018  Plasma 12/25/18  I, Lala Lund, MD, consented Subject Joel Greene (male, Date of Birth 1951-05-11, 68 y.o.) and with diagnosis of COVID-19, in the [facility] Expanded Access Program (EAP) Research Protocol for [employer] against COVID-19.  The consent took place under following circumstances.   Subject Capacity assessed by this investigator as:  Presence of adequate emotional and mental capacity to consent with normal ability to read and write.  Consent took place in the following setting(s):  In-room,  face to face   The following were present for the consent process:  Investigator   A copy of the cover letter and signed consent document was provided to subject/LAR.  The original signed consent document has been placed in the subject's physical chart and will be scanned into the electronic medical record upon discharge.  Statement of acknowledgement that the following was discussed with the  subject/LAR:    1) Discussed the purpose of the research and procedures  2) Discussed risks and benefits and uncertainties of study participation 3) Discussed subject's responsibilities  4) Discussed the measures in place to maintain subject's confidentiality while a participant on the trial  5) Discussed alternatives to study participation.   6) Discussed study participation is voluntary and that the subject's care would not be jeopardized if they declined participation in the study.   7) Discussed freedom to withdraw at any time.   8) All subject/LAR questions were answered to their satisfaction.   9) In case of emergency consent, investigator agreed to discuss with subject/LAR at earliest available opportunity when the subject stabilizes and/or LAR can be located.     Final Investigator Signature  Lala Lund, [place] [phone]   Date: 12/27/2018 and 9:32 AM     Objective: Vitals:   12/27/18 0500 12/27/18 0700 12/27/18 0800 12/27/18 0900  BP: 115/73 131/79 134/75 126/70  Pulse: 69 82 90 100  Resp: (!) 23 (!) 36 (!) 23 (!) 30  Temp: 98.5 F (36.9 C)  (!) 97.5 F (36.4 C)   TempSrc: Oral     SpO2: 96% (!) 87% (!) 87% (!) 86%  Weight: 72.8 kg     Height:        Intake/Output Summary (Last 24 hours) at 12/27/2018 0932 Last data filed at 12/27/2018 0900 Gross per 24 hour  Intake 1657.62 ml  Output 2225 ml  Net -567.38 ml   Filed Weights   12/24/18 0600 12/25/18 0500 12/27/18 0500  Weight: 80.1 kg 78.7 kg 72.8 kg    Examination:  Awake Alert,mildly anxious affect, mild SOB [place].AT,PERRAL Supple Neck,No JVD, No cervical lymphadenopathy appriciated.  Symmetrical Chest wall movement, Good air movement bilaterally, CTAB RRR,No Gallops, Rubs or new Murmurs, No Parasternal Heave +ve B.Sounds, Abd Soft, No tenderness, No organomegaly appriciated, No rebound - guarding or rigidity. No Cyanosis, Clubbing or edema, No new Rash or bruise   Data Reviewed: I have personally  reviewed following labs and imaging studies  CBC: Recent Labs  Lab 12/23/18 0500 12/24/18 0300 12/24/18 2137 12/25/18 0045 12/26/18 0145 12/27/18 0800  WBC 8.9 10.6*  --  9.2 8.1 11.7*  NEUTROABS 8.3* 9.9*  --  8.6* 7.6 10.8*  HGB 11.9* 12.8* 13.9 13.4 12.8* 14.1  HCT 34.1* 36.9* 41.0 38.8* 39.0 42.1  MCV 94.7 95.8  --  94.2 96.8 96.6  PLT 302 338  --  363 370 409   Basic Metabolic Panel: Recent Labs  Lab 12/21/18 0200 12/22/18 0415 12/23/18 0500 12/24/18 0300 12/24/18 2137 12/25/18 0045 12/26/18 0145 12/27/18 0800  NA 144 143 139 139 139 138 141 139  K 3.1* 4.4 3.7 3.9 3.6 4.0 3.8 4.1  CL 106 106 105 103  --  100 101 103  CO2 29 27 27 28   --  26 29 24   GLUCOSE 221* 174* 168* 177*  --  177* 223* 265*  BUN 29* 20 20 22   --  24* 39* 34*  CREATININE 0.88 0.80 0.71 0.68  --  0.81 0.92 0.82  CALCIUM 8.1* 7.8* 7.7* 7.9*  --  8.0* 8.3* 8.7*  MG 2.2 1.9 1.9 2.2  --  2.2 2.6* 2.3  PHOS 2.2* 4.3  --   --   --   --   --   --    GFR: Estimated Creatinine Clearance: 79.5 mL/min (by C-G formula based on SCr of 0.82 mg/dL). Liver Function Tests: Recent Labs  Lab 12/22/18 0415 12/23/18 0500 12/24/18 0300 12/25/18 0045 12/26/18 0145  AST 38 33 29 34 29  ALT 79* 67* 56* 54* 49*  ALKPHOS 78 83 82 87 90  BILITOT 0.8 0.7 0.5 0.9 0.9  PROT 5.2* 5.2* 5.2* 5.4* 5.7*  ALBUMIN 2.0* 2.0* 2.2* 2.2* 2.5*   No results for input(s): LIPASE, AMYLASE in the last 168 hours. No results for input(s): AMMONIA in the last 168 hours. Coagulation Profile: No results for input(s): INR, PROTIME in the last 168 hours. Cardiac Enzymes: Recent Labs  Lab 12/21/18 0200 12/22/18 0415  CKTOTAL 14* 15*   BNP (last 3 results) No results for input(s): PROBNP in the last 8760 hours. HbA1C: No results for input(s): HGBA1C in the last 72 hours. CBG: Recent Labs  Lab 12/26/18 0821 12/26/18 1219 12/26/18 1627 12/26/18 2218 12/27/18 0701  GLUCAP 167* 253* 245* 278* 227*   Lipid Profile: No  results for input(s): CHOL, HDL, LDLCALC, TRIG, CHOLHDL, LDLDIRECT in the last 72 hours. Thyroid Function Tests: No results for input(s): TSH, T4TOTAL, FREET4, T3FREE, THYROIDAB in the last 72 hours. Anemia Panel: Recent Labs    12/25/18 0045 12/26/18 0145  FERRITIN 1,092* 1,789*   Sepsis Labs: Recent Labs  Lab 12/21/18 0200  PROCALCITON 0.58    Recent Results (from the past 240 hour(s))  SARS Coronavirus 2 (CEPHEID - Performed in [place] hospital lab), Hosp Order     Status: Abnormal   Collection Time: 12/18/18  1:13 PM   Specimen: Nasopharyngeal Swab  Result Value Ref Range Status   SARS Coronavirus 2 POSITIVE (A) NEGATIVE Final    Comment: CRITICAL RESULT CALLED TO, READ BACK BY AND VERIFIED WITH: A. TUTTLE @1426   12/18/18 KAY (NOTE) If result is NEGATIVE SARS-CoV-2 target nucleic acids are NOT DETECTED. The SARS-CoV-2 RNA is generally detectable in upper and lower  respiratory specimens during the acute phase of infection. The lowest  concentration of SARS-CoV-2 viral copies this assay can detect is 250  copies / mL. A negative result does not preclude SARS-CoV-2 infection  and should not be used as the sole basis for treatment or other  patient management decisions.  A negative result may occur with  improper specimen collection / handling, submission of specimen other  than nasopharyngeal swab, presence of viral mutation(s) within the  areas targeted by this assay, and inadequate number of viral copies  (<250 copies / mL). A negative result must be combined with clinical  observations, patient history, and epidemiological information. If result is POSITIVE SARS-CoV-2 target nucleic acids are DETECTED.  The SARS-CoV-2 RNA is generally detectable in upper and lower  respiratory specimens during the acute phase of infection.  Positive  results are indicative of active infection with SARS-CoV-2.  Clinical  correlation with patient history and other diagnostic  information is  necessary to determine patient infection status.  Positive results do  not rule out bacterial infection or co-infection with other viruses. If result is PRESUMPTIVE POSTIVE SARS-CoV-2 nucleic acids MAY BE PRESENT.   A presumptive positive result was obtained on the submitted specimen  and confirmed on  repeat testing.  While 2019 novel coronavirus  (SARS-CoV-2) nucleic acids may be present in the submitted sample  additional confirmatory testing may be necessary for epidemiological  and / or clinical management purposes  to differentiate between  SARS-CoV-2 and other Sarbecovirus currently known to infect humans.  If clinically indicated additional testing with an alternate test  methodology [phone])  is advised. The SARS-CoV-2 RNA is generally  detectable in upper and lower respiratory specimens during the acute  phase of infection. The expected result is Negative. Fact Sheet for Patients:  StrictlyIdeas.no Fact Sheet for Healthcare Providers: BankingDealers.co.za This test is not yet approved or cleared by the [place] FDA and has been authorized for detection and/or diagnosis of SARS-CoV-2 by FDA under an Emergency Use Authorization (EUA).  This EUA will remain in effect (meaning this test can be used) for the duration of the COVID-19 declaration under Section 564(b)(1) of the Act, 21 U.S.C. section 360bbb-3(b)(1), unless the authorization is terminated or revoked sooner. Performed at [facility], [address] [place], [place] [address]   Blood Culture (routine x 2)     Status: None   Collection Time: 12/18/18  1:44 PM   Specimen: Left Antecubital; Blood  Result Value Ref Range Status   Specimen Description   Final    LEFT ANTECUBITAL BOTTLES DRAWN AEROBIC AND ANAEROBIC   Special Requests Blood Culture adequate volume  Final   Culture   Final    NO GROWTH 5 DAYS Performed at [facility], [address], [place], [place] [address]    Report Status 12/23/2018 FINAL  Final  Blood Culture (routine x 2)     Status: None   Collection Time: 12/18/18  1:45 PM   Specimen: BLOOD RIGHT WRIST  Result Value Ref Range Status   Specimen Description   Final    BLOOD RIGHT WRIST BOTTLES DRAWN AEROBIC AND ANAEROBIC   Special Requests Blood Culture adequate volume  Final   Culture   Final    NO GROWTH 5 DAYS Performed at [facility], [address] [place], [place] [address]    Report Status 12/23/2018 FINAL  Final  MRSA PCR Screening     Status: None   Collection Time: 12/19/18  5:24 AM   Specimen: Nasal Mucosa; Nasopharyngeal  Result Value Ref Range Status   MRSA by PCR NEGATIVE NEGATIVE Final    Comment:        The GeneXpert MRSA Assay (FDA approved for NASAL specimens only), is one component of a comprehensive MRSA colonization surveillance program. It is not intended to diagnose MRSA infection nor to guide or monitor treatment for MRSA infections. Performed at [facility], [place] [address], [place], [place] [address]   C difficile quick scan w PCR reflex     Status: None   Collection Time: 12/20/18 10:02 PM   Specimen: STOOL  Result Value Ref Range Status   C Diff antigen NEGATIVE NEGATIVE Final   C Diff toxin NEGATIVE NEGATIVE Final   C Diff interpretation No C. difficile detected.  Final    Comment: Performed at [facility], [place] [address], [place], [place] [address]         Radiology Studies: Dg Chest Port 1 View  Result Date: 12/27/2018 CLINICAL DATA:  Shortness of breath EXAM: PORTABLE CHEST 1 VIEW COMPARISON:  December 25, 2018 FINDINGS: Central catheter tip is in the superior vena cava. No pneumothorax. There is patchy airspace consolidation in the right mid lung  and both lower lung zone regions. There is cardiomegaly with pulmonary vascularity normal. No adenopathy. No bone lesions. IMPRESSION: Stable airspace opacity bilaterally, most  likely due to multifocal pneumonia. Stable cardiac prominence. No change in central catheter position. No pneumothorax. Electronically Signed   By: Lowella Grip III M.D.   On: 12/27/2018 08:51   Dg Chest Port 1 View  Result Date: 12/25/2018 CLINICAL DATA:  Status postextubation.  Hypoxia. EXAM: PORTABLE CHEST 1 VIEW COMPARISON:  December 20, 2018 FINDINGS: Endotracheal tube and nasogastric tube have been removed. Central catheter tip is in the superior vena cava. No pneumothorax. There is airspace opacity in both lower lobes with consolidation medially in both lung bases, progressed compared to most recent study. More patchy infiltrate in the right upper lobe is essentially stable. Left upper lobe is now clear. There is cardiomegaly with pulmonary vascularity normal. No adenopathy. No bone lesions. IMPRESSION: Progression of airspace opacity in each lower lobe. Stable more subtle opacity right upper lobe. Left upper lobe now clear. Stable cardiomegaly. No adenopathy evident. Central catheter tip in superior vena cava.  No pneumothorax. Electronically Signed   By: Lowella Grip III M.D.   On: 12/25/2018 10:30     Scheduled Meds: . aspirin  81 mg Oral Daily  . atorvastatin  20 mg Oral Daily  . busPIRone  10 mg Oral BID  . Chlorhexidine Gluconate Cloth  6 each Topical Daily  . cholecalciferol  5,000 Units Oral Daily  . clonazepam  1 mg Oral TID  . enoxaparin (LOVENOX) injection  40 mg Subcutaneous Q12H  . feeding supplement (ENSURE ENLIVE)  237 mL Oral TID BM  . fluticasone  2 spray Each Nare Daily  . gabapentin  300 mg Oral 2 times per day   And  . gabapentin  600 mg Oral QHS  . insulin aspart  0-20 Units Subcutaneous TID WC  . insulin aspart  0-5 Units Subcutaneous QHS  . latanoprost  1 drop Both Eyes QHS  . mouth rinse  15 mL Mouth Rinse BID  . methylPREDNISolone (SOLU-MEDROL) injection  60 mg Intravenous Q12H  . multivitamin with minerals  1 tablet Oral Daily  . pantoprazole  40 mg  Oral Daily  . polyethylene glycol  17 g Oral Daily  . saccharomyces boulardii  250 mg Oral BID  . tamsulosin  0.4 mg Oral QPC breakfast  . venlafaxine XR  75 mg Oral Q breakfast   Continuous Infusions: . dexmedetomidine (PRECEDEX) IV infusion 0.4 mcg/kg/hr (12/27/18 0742)     LOS: 9 days   Signature  Lala Lund M.D on 12/27/2018 at 9:32 AM   -  To page go to [email or web address]

## 2018-12-27 NOTE — Progress Notes (Signed)
Nutrition Follow-up  RD working remotely.  DOCUMENTATION CODES:   Not applicable  INTERVENTION:   Continue to offer Ensure Enlive po TID, each supplement provides 350 kcal and 20 grams of protein.  Continue Hormel Shake daily with Breakfast which provides 520 kcals and 22 g of protein and Magic cup BID with lunch and dinner, each supplement provides 290 kcal and 9 grams of protein, automatically on meal trays to optimize nutritional intake.   NUTRITION DIAGNOSIS:   Inadequate oral intake related to acute illness as evidenced by per patient/family report.  Ongoing  GOAL:   Patient will meet greater than or equal to 90% of their needs  Unmet  MONITOR:   PO intake, Supplement acceptance, Labs  ASSESSMENT:   68 yo male admitted with COVID-19 related acute respiratory failure requiring intubation at [facility] and transferred to [facility].  PMH includes bronchitis, IBS.  S/P convalescent plasma 6/26. Patient still feeling short of breath today. Diet has been advanced to carbohydrate modified. Patient is consuming 40-75% of meals. He reports that his appetite has improved since last week. He is receiving 1-2 Ensure Enlive supplements every day, but doesn't always drink the whole supplement.  Labs reviewed. CBG's: [phone]  Medications reviewed and include Vitamin D, Novolog, Florastor, MVI, Flomax, Precedex.    Diet Order:   Diet Order            Diet Carb Modified Fluid consistency: Thin; Room service appropriate? Yes  Diet effective now              EDUCATION NEEDS:   Not appropriate for education at this time  Skin:  Skin Assessment: Reviewed RN Assessment  Last BM:  prior to admission  Height:   Ht Readings from Last 1 Encounters:  12/19/18 5\' 7"  (1.702 m)    Weight:   Wt Readings from Last 1 Encounters:  12/27/18 72.8 kg    Ideal Body Weight:     BMI:  Body mass index is 25.14 kg/m.  Estimated Nutritional Needs:   Kcal:  1900-2100  kcals  Protein:  95-105 g  Fluid:  >/= 1.9 L    Molli Barrows, RD, LDN, [place] Pager [phone] After Hours Pager [phone]

## 2018-12-27 NOTE — Progress Notes (Signed)
Inpatient Diabetes Program Recommendations  AACE/ADA: New Consensus Statement on Inpatient Glycemic Control (2015)  Target Ranges:  Prepandial:   less than 140 mg/dL      Peak postprandial:   less than 180 mg/dL (1-2 hours)      Critically ill patients:  140 - 180 mg/dL   Lab Results  Component Value Date   GLUCAP 132 (H) 12/27/2018   HGBA1C 6.5 (H) 12/21/2018    Review of Glycemic Control Results for KATAI, MARSICO (MRN [id number]) as of 12/27/2018 14:50  Ref. Range 12/26/2018 08:21 12/26/2018 12:19 12/26/2018 16:27 12/26/2018 22:18 12/27/2018 07:01 12/27/2018 12:07  Glucose-Capillary Latest Ref Range: 70 - 99 mg/dL 167 (H) 253 (H) 245 (H) 278 (H) 227 (H) 132 (H)   Diabetes history: None- A1C=6.5% (is this new diagnosis?) Current orders for Inpatient glycemic control:  Novolog resistant tid with meals and HS Solumedrol 60 mg IV q 12 hours Inpatient Diabetes Program Recommendations:   May consider adding Levemir 5 units bid while on steroids.   Thanks  Adah Perl, RN, BC-ADM Inpatient Diabetes Coordinator Pager [phone] (8a-5p)

## 2018-12-27 NOTE — Progress Notes (Signed)
NAME:  Joel Greene, MRN:  [id number], DOB:  Mar 27, 1951, LOS: 9 ADMISSION DATE:  12/18/2018, CONSULTATION DATE:  6/20 REFERRING MD:  TRH-Singh, CHIEF COMPLAINT:  Dyspnea  Brief History   68 y/o male with a history of bronchitis presented with dyspnea and hypoxemia, initially required intubation on 6/20.  Extubated on 6/22.  Has had worsening hypoxemia.   Past Medical History  Bronchitis Inflammatory bowel syndrome Anxiety Depression Hyperlipidemia GERD  Significant Hospital Events   6/20 intubation for COVID  6/22 extubated 6/27 moved back to ICU, hypoxemic  Consults:  PCCM  Procedures:  6/20 ETT > 6/22  6/21 R arm PICC >   Significant Diagnostic Tests:    Micro Data:  6/20 SARS-COV2> positive 6/20 blood > negative 6/22 c diff > negative   Antimicrobials:  6/20 zosyn x1 6/20 vanc x1 6/21 azithro > 6/23 6/21 ceftriaxone > 6/23 6/20 remdesivir >  6/26 actemra x1 6/26  Convalescent plasma  Interim history/subjective:   Hypoxemia about the same Feels about the same, still has some shortness of breath Eating, had a bowel movement yesterday   Objective   Blood pressure 115/73, pulse 69, temperature 98 F (36.7 C), temperature source Oral, resp. rate (!) 23, height 5\' 7"  (1.702 m), weight 72.8 kg, SpO2 96 %.    FiO2 (%):  [40 %-100 %] 40 %   Intake/Output Summary (Last 24 hours) at 12/27/2018 0751 Last data filed at 12/27/2018 0720 Gross per 24 hour  Intake 1634.3 ml  Output 1700 ml  Net -65.7 ml   Filed Weights   12/24/18 0600 12/25/18 0500 12/27/18 0500  Weight: 80.1 kg 78.7 kg 72.8 kg    Examination:  General:  Resting comfortably in bed HENT: NCAT OP clear PULM: CTA B, normal effort CV: RRR, no mgr GI: BS+, soft, nontender MSK: normal bulk and tone Neuro: awake, alert, no distress, MAEW   Resolved Hospital Problem list     Assessment & Plan:  ARDS due to SARS-COV2: Oxygenation and oxygen needs worse in setting of panic and anxiety,  improved with Precedex Possibly acute diastolic heart failure Continue Precedex infusion today again, continue to try to wean off Continue clonazepam Continue oxygen via heated high flow Continue to monitor carefully in the intensive care unit as he is at high risk of decompensation and needing intubation Tolerate periods of hypoxemia, goal at rest is greater than 85% SaO2, with movement ideally above 75% Decision for intubation should be based on a change in mental status or physical evidence of ventilatory failure such as nasal flaring, accessory muscle use, paradoxical breathing Out of bed to chair as able Prone positioning while in bed   Anxiety/depression Frequent orientation Continue music therapy, allow him to watch movies as able Continue clonazepam, PRN Ativan Continue to try to wean off Precedex Continue BuSpar and Effexor  Urinary retention: Consider foley removal Continue flomax   Best practice:  Diet: npo for now, may need tube feeding if intubated Pain/Anxiety/Delirium protocol (if indicated): n/a VAP protocol (if indicated): n/a DVT prophylaxis: lovenox GI prophylaxis: n/a Glucose control: per [facility] Mobility: out of bed today Code Status: full Family Communication: per [facility] Disposition: remain in ICU  Labs   CBC: Recent Labs  Lab 12/22/18 0415 12/23/18 0500 12/24/18 0300 12/24/18 2137 12/25/18 0045 12/26/18 0145  WBC 8.4 8.9 10.6*  --  9.2 8.1  NEUTROABS 7.9* 8.3* 9.9*  --  8.6* 7.6  HGB 11.3* 11.9* 12.8* 13.9 13.4 12.8*  HCT 32.9* 34.1* 36.9*  41.0 38.8* 39.0  MCV 96.2 94.7 95.8  --  94.2 96.8  PLT 336 302 338  --  363 154    Basic Metabolic Panel: Recent Labs  Lab 12/21/18 0200 12/22/18 0415 12/23/18 0500 12/24/18 0300 12/24/18 2137 12/25/18 0045 12/26/18 0145  NA 144 143 139 139 139 138 141  K 3.1* 4.4 3.7 3.9 3.6 4.0 3.8  CL 106 106 105 103  --  100 101  CO2 29 27 27 28   --  26 29  GLUCOSE 221* 174* 168* 177*  --  177* 223*  BUN 29*  20 20 22   --  24* 39*  CREATININE 0.88 0.80 0.71 0.68  --  0.81 0.92  CALCIUM 8.1* 7.8* 7.7* 7.9*  --  8.0* 8.3*  MG 2.2 1.9 1.9 2.2  --  2.2 2.6*  PHOS 2.2* 4.3  --   --   --   --   --    GFR: Estimated Creatinine Clearance: 70.8 mL/min (by C-G formula based on SCr of 0.92 mg/dL). Recent Labs  Lab 12/21/18 0200  12/23/18 0500 12/24/18 0300 12/25/18 0045 12/26/18 0145  PROCALCITON 0.58  --   --   --   --   --   WBC 8.4   < > 8.9 10.6* 9.2 8.1   < > = values in this interval not displayed.    Liver Function Tests: Recent Labs  Lab 12/22/18 0415 12/23/18 0500 12/24/18 0300 12/25/18 0045 12/26/18 0145  AST 38 33 29 34 29  ALT 79* 67* 56* 54* 49*  ALKPHOS 78 83 82 87 90  BILITOT 0.8 0.7 0.5 0.9 0.9  PROT 5.2* 5.2* 5.2* 5.4* 5.7*  ALBUMIN 2.0* 2.0* 2.2* 2.2* 2.5*   No results for input(s): LIPASE, AMYLASE in the last 168 hours. No results for input(s): AMMONIA in the last 168 hours.  ABG    Component Value Date/Time   PHART 7.629 (HH) 12/24/2018 2137   PCO2ART 27.1 (L) 12/24/2018 2137   PO2ART 47.0 (L) 12/24/2018 2137   HCO3 28.5 (H) 12/24/2018 2137   TCO2 29 12/24/2018 2137   O2SAT 91.0 12/24/2018 2137     Coagulation Profile: No results for input(s): INR, PROTIME in the last 168 hours.  Cardiac Enzymes: Recent Labs  Lab 12/21/18 0200 12/22/18 0415  CKTOTAL 14* 15*    HbA1C: Hgb A1c MFr Bld  Date/Time Value Ref Range Status  12/21/2018 02:00 AM 6.5 (H) 4.8 - 5.6 % Final    Comment:    (NOTE) Pre diabetes:          5.7%-6.4% Diabetes:              >6.4% Glycemic control for   <7.0% adults with diabetes     CBG: Recent Labs  Lab 12/26/18 0821 12/26/18 1219 12/26/18 1627 12/26/18 2218 12/27/18 0701  GLUCAP 167* 253* 245* 278* 227*    Critical care time: 35 minutes       Roselie Awkward, MD [place] [facility] Pager: [phone] Cell: [phone] If no response, call [phone]

## 2018-12-27 NOTE — Progress Notes (Signed)
Occupational Therapy Treatment Patient Details Name: Joel Greene MRN: [id number] DOB: December 20, 1950 Today's Date: 12/27/2018    History of present illness Pt is a 68 y.o. male admitted 12/18/18 with fever, chills and SOB; became progressively hypoxic and tachypnic, tested postiive for COVID-19. ETT 6/20-6/22. PMH includes OSA, IBS, DJD, glaucoma.   OT comments  Pt seen on 20L HFNC 80% FiO2. Completed bed level exercises. Pt wanting to try to transfer to [facility] for BM. Able to dangle EOB, however, pt became anxious with increased RR to upper 40s and asked to recline back to bed. End of session, HR 95; RR 22; BP 146/78; SpO2 97. Discussed attmepting progression OOB to chair next session if able. Pt agreeable. Encourage "chair position" in bed and completion of theraband exercises.  Given decline in function, pt will most likley need rehab at [facility] before returning home. If pt progresses, he may be appropriate for [facility] with a program like [facility].  Will continue to follow. Pt very appreciative.    Follow Up Recommendations  SNF;Supervision/Assistance - 24 hour vs HHOT with program like [facility] - pending progress   Equipment Recommendations  3 in 1 bedside commode    Recommendations for Other Services      Precautions / Restrictions Precautions Precautions: Fall Precaution Comments: monitor sats, RR, HR       Mobility Bed Mobility Overal bed mobility: Needs Assistance Bed Mobility: Supine to Sit     Supine to sit: Min assist     General bed mobility comments: with HOB elevated  Transfers                 General transfer comment: unable today    Balance Overall balance assessment: Needs assistance   Sitting balance-Leahy Scale: Fair                                     ADL either performed or assessed with clinical judgement   ADL Overall ADL's : Needs assistance/impaired Eating/Feeding: Set up   Grooming: Set up;Bed level   Upper Body Bathing:  Set up;Bed level   Lower Body Bathing: Moderate assistance;Bed level   Upper Body Dressing : Minimal assistance;Bed level   Lower Body Dressing: Moderate assistance;Bed level               Functional mobility during ADLs: Minimal assistance(bed mobility) General ADL Comments: Attempted BSC transfer however, pt became too anxious with increased RR     Vision       Perception     Praxis      Cognition Arousal/Alertness: Awake/alert Behavior During Therapy: Anxious Overall Cognitive Status: Within Functional Limits for tasks assessed                                          Exercises General Exercises - Lower Extremity Heel Slides: AROM;Both;10 reps Straight Leg Raises: AROM;Strengthening;Both;10 reps Hip Flexion/Marching: AROM;Strengthening;10 reps Other Exercises Other Exercises: theraband level 1 for elbow flex/ext x 10 B UE; shoulder abduction x 10 BUE   Shoulder Instructions       General Comments      Pertinent Vitals/ Pain       Pain Assessment: No/denies pain  Home Living  Prior Functioning/Environment              Frequency  Min 3X/week        Progress Toward Goals  OT Goals(current goals can now be found in the care plan section)  Progress towards OT goals: Not progressing toward goals - comment(decline in medical status - transfer to ICU)  Acute Rehab OT Goals Patient Stated Goal: go home soon OT Goal Formulation: With patient Time For Goal Achievement: 01/06/19 Potential to Achieve Goals: Good ADL Goals Pt Will Perform Lower Body Bathing: with modified independence;with adaptive equipment;sit to/from stand Pt Will Perform Lower Body Dressing: with modified independence;with adaptive equipment;sit to/from stand Pt Will Transfer to Toilet: Independently;ambulating;bedside commode Pt Will Perform Toileting - Clothing Manipulation and hygiene: with modified  independence;sit to/from stand Pt Will Perform Tub/Shower Transfer: with supervision;ambulating;3 in 1;Tub transfer Pt/caregiver will Perform Home Exercise Program: Increased strength;With theraband;With written HEP provided;Independently Additional ADL Goal #1: Pt will independently verbalize 3 energy conservation strategies for ADL adn IADL tasks  Plan Discharge plan needs to be updated;Frequency needs to be updated    Co-evaluation                 AM-PAC OT "6 Clicks" Daily Activity     Outcome Measure   Help from another person eating meals?: None Help from another person taking care of personal grooming?: A Little Help from another person toileting, which includes using toliet, bedpan, or urinal?: A Lot Help from another person bathing (including washing, rinsing, drying)?: A Lot Help from another person to put on and taking off regular upper body clothing?: A Little Help from another person to put on and taking off regular lower body clothing?: A Lot 6 Click Score: 16    End of Session    OT Visit Diagnosis: Unsteadiness on feet (R26.81);Muscle weakness (generalized) (M62.81);Pain   Activity Tolerance Patient tolerated treatment well   Patient Left in bed;with call bell/phone within reach;with nursing/sitter in room   Nurse Communication Mobility status        Time: 2671-2458 OT Time Calculation (min): 30 min  Charges: OT General Charges $OT Visit: 1 Visit OT Treatments $Self Care/Home Management : 8-22 mins $Therapeutic Exercise: 8-22 mins  Maurie Boettcher, OT/L   Acute OT Clinical Specialist Acute Rehabilitation Services Pager [phone] Office [phone]    [facility] 12/27/2018, 2:05 PM

## 2018-12-27 NOTE — Progress Notes (Signed)
Pt reports speaking with family using personal phone. Pt's friend Konrad Dolores called and updated of pt condition.

## 2018-12-28 DIAGNOSIS — Z9289 Personal history of other medical treatment: Secondary | ICD-10-CM

## 2018-12-28 DIAGNOSIS — Z01818 Encounter for other preprocedural examination: Secondary | ICD-10-CM

## 2018-12-28 LAB — GLUCOSE, CAPILLARY
Glucose-Capillary: 265 mg/dL — ABNORMAL HIGH (ref 70–99)
Glucose-Capillary: 158 mg/dL — ABNORMAL HIGH (ref 70–99)
Glucose-Capillary: 228 mg/dL — ABNORMAL HIGH (ref 70–99)
Glucose-Capillary: 344 mg/dL — ABNORMAL HIGH (ref 70–99)
Glucose-Capillary: 312 mg/dL — ABNORMAL HIGH (ref 70–99)

## 2018-12-28 LAB — C-REACTIVE PROTEIN: CRP: 0.8 mg/dL (ref <1.0)

## 2018-12-28 LAB — CBC WITH DIFFERENTIAL/PLATELET
Abs Immature Granulocytes: 0.17 10*3/uL — ABNORMAL HIGH (ref 0.00–0.07)
Basophils Absolute: 0 10*3/uL (ref 0.0–0.1)
Basophils Relative: 0 %
Eosinophils Absolute: 0 10*3/uL (ref 0.0–0.5)
Eosinophils Relative: 0 %
HCT: 36.6 % — ABNORMAL LOW (ref 39.0–52.0)
Hemoglobin: 12.4 g/dL — ABNORMAL LOW (ref 13.0–17.0)
Immature Granulocytes: 2 %
Lymphocytes Relative: 3 %
Lymphs Abs: 0.3 10*3/uL — ABNORMAL LOW (ref 0.7–4.0)
MCH: 32.9 pg (ref 26.0–34.0)
MCHC: 33.9 g/dL (ref 30.0–36.0)
MCV: 97.1 fL (ref 80.0–100.0)
Monocytes Absolute: 0.2 10*3/uL (ref 0.1–1.0)
Monocytes Relative: 2 %
Neutro Abs: 10.1 10*3/uL — ABNORMAL HIGH (ref 1.7–7.7)
Neutrophils Relative %: 93 %
Platelets: 293 10*3/uL (ref 150–400)
RBC: 3.77 MIL/uL — ABNORMAL LOW (ref 4.22–5.81)
RDW: 12.7 % (ref 11.5–15.5)
WBC: 10.8 10*3/uL — ABNORMAL HIGH (ref 4.0–10.5)
nRBC: 0 % (ref 0.0–0.2)

## 2018-12-28 LAB — COMPREHENSIVE METABOLIC PANEL
ALT: 94 U/L — ABNORMAL HIGH (ref 0–44)
AST: 41 U/L (ref 15–41)
Albumin: 2.3 g/dL — ABNORMAL LOW (ref 3.5–5.0)
Alkaline Phosphatase: 104 U/L (ref 38–126)
Anion gap: 9 (ref 5–15)
BUN: 28 mg/dL — ABNORMAL HIGH (ref 8–23)
CO2: 26 mmol/L (ref 22–32)
Calcium: 8.3 mg/dL — ABNORMAL LOW (ref 8.9–10.3)
Chloride: 102 mmol/L (ref 98–111)
Creatinine, Ser: 0.73 mg/dL (ref 0.61–1.24)
GFR calc Af Amer: >60 mL/min (ref >60)
GFR calc non Af Amer: >60 mL/min (ref >60)
Glucose, Bld: 252 mg/dL — ABNORMAL HIGH (ref 70–99)
Potassium: 4.4 mmol/L (ref 3.5–5.1)
Sodium: 137 mmol/L (ref 135–145)
Total Bilirubin: 0.5 mg/dL (ref 0.3–1.2)
Total Protein: 5.2 g/dL — ABNORMAL LOW (ref 6.5–8.1)

## 2018-12-28 LAB — LACTATE DEHYDROGENASE: LDH: 276 U/L — ABNORMAL HIGH (ref 98–192)

## 2018-12-28 LAB — BRAIN NATRIURETIC PEPTIDE: B Natriuretic Peptide: 50.5 pg/mL (ref 0.0–100.0)

## 2018-12-28 LAB — FERRITIN: Ferritin: 1431 ng/mL — ABNORMAL HIGH (ref 24–336)

## 2018-12-28 LAB — D-DIMER, QUANTITATIVE: D-Dimer, Quant: 1.51 ug/mL-FEU — ABNORMAL HIGH (ref 0.00–0.50)

## 2018-12-28 LAB — MAGNESIUM: Magnesium: 2.1 mg/dL (ref 1.7–2.4)

## 2018-12-28 MED ORDER — SODIUM CHLORIDE 0.9 % IV SOLN
INTRAVENOUS | Status: DC
  Administered 2018-12-28 – 2018-12-30 (×3): via INTRAVENOUS

## 2018-12-28 MED ORDER — FUROSEMIDE 10 MG/ML IJ SOLN
40.0000 mg | Freq: Once | INTRAMUSCULAR | Status: AC
  Administered 2018-12-28: 40 mg via INTRAVENOUS
  Filled 2018-12-28: qty 4

## 2018-12-28 MED ORDER — METHYLPREDNISOLONE SODIUM SUCC 40 MG IJ SOLR
40.0000 mg | Freq: Two times a day (BID) | INTRAMUSCULAR | Status: DC
  Administered 2018-12-28 – 2018-12-29 (×3): 40 mg via INTRAVENOUS
  Filled 2018-12-28 (×4): qty 1

## 2018-12-28 MED ORDER — BUSPIRONE HCL 5 MG PO TABS
15.0000 mg | ORAL_TABLET | Freq: Two times a day (BID) | ORAL | Status: DC
  Administered 2018-12-28 – 2019-01-02 (×10): 15 mg via ORAL
  Filled 2018-12-28 (×13): qty 1

## 2018-12-28 MED ORDER — CLONIDINE HCL 0.1 MG PO TABS
0.1000 mg | ORAL_TABLET | Freq: Three times a day (TID) | ORAL | Status: DC
  Administered 2018-12-28 – 2018-12-31 (×9): 0.1 mg via ORAL
  Filled 2018-12-28 (×9): qty 1

## 2018-12-28 NOTE — Progress Notes (Signed)
Gaspar Skeeters (spouse) called and updated on patient.

## 2018-12-28 NOTE — Progress Notes (Signed)
OT Cancellation Note  Patient Details Name: Joel Greene MRN: [id number] DOB: [birth year]   Cancelled Treatment:    Reason Eval/Treat Not Completed: Other (comment). See PT note. Pt had Xanax and is sleeping. Let written HEP for pt to complete with theraband. Nsg aware.   Ramond Dial, OT/L   Acute OT Clinical Specialist Acute Rehabilitation Services Pager [phone] Office [phone]  12/28/2018, 4:07 PM

## 2018-12-28 NOTE — Progress Notes (Signed)
Patient asked to make a phone call to his friend. RN asked if she needed to update any other family members or friends this evening and patient stated no. Will continue to closely monitor patient.

## 2018-12-28 NOTE — Progress Notes (Signed)
Rose was called and updated. All questions are answered

## 2018-12-28 NOTE — Progress Notes (Deleted)
RT & RN repositioned head to the left side. ETT remains secured 28@ lip.

## 2018-12-28 NOTE — Progress Notes (Signed)
NAME:  Joel Greene, MRN:  [id number], DOB:  May 09, 1951, LOS: 2 ADMISSION DATE:  12/18/2018, CONSULTATION DATE:  6/20 REFERRING MD:  TRH-Singh, CHIEF COMPLAINT:  Dyspnea  Brief History   68 y/o male with a history of bronchitis presented with dyspnea and hypoxemia, initially required intubation on 6/20.  Extubated on 6/22.  Has had worsening hypoxemia.   Past Medical History  Bronchitis Inflammatory bowel syndrome Anxiety Depression Hyperlipidemia GERD  Significant Hospital Events   6/20 intubation for COVID  6/22 extubated 6/27 moved back to ICU, hypoxemic  Consults:  PCCM  Procedures:  6/20 ETT > 6/22  6/21 R arm PICC >   Significant Diagnostic Tests:    Micro Data:  6/20 SARS-COV2> positive 6/20 blood > negative 6/22 c diff > negative   Antimicrobials:  6/20 zosyn x1 6/20 vanc x1 6/21 azithro > 6/23 6/21 ceftriaxone > 6/23 6/20 remdesivir >  6/26 actemra x1 6/26  Convalescent plasma  Interim history/subjective:   More comfortable initially this morning, however has thoughts of wanting to die.  Says "I don't want to live this way anymore". Periods of hypoxemia with moving, panic  Objective   Blood pressure (!) 142/91, pulse 75, temperature 98 F (36.7 C), temperature source Oral, resp. rate (!) 38, height 5\' 7"  (1.702 m), weight 72.8 kg, SpO2 90 %.    FiO2 (%):  [40 %-100 %] 100 %   Intake/Output Summary (Last 24 hours) at 12/28/2018 1203 Last data filed at 12/28/2018 1100 Gross per 24 hour  Intake 1025.62 ml  Output 2050 ml  Net -1024.38 ml   Filed Weights   12/25/18 0500 12/27/18 0500 12/28/18 0500  Weight: 78.7 kg 72.8 kg 72.8 kg    Examination:  General:  Resting comfortably in bed HENT: NCAT OP clear PULM: CTA B, normal effort CV: RRR, no mgr GI: BS+, soft, nontender MSK: normal bulk and tone Neuro: awake, alert, no distress, MAEW Psyche: anxious   Resolved Hospital Problem list     Assessment & Plan:  ARDS due to  SARS-COV2: Oxygenation and oxygen needs worse in setting of panic and anxiety, improved with Precedex Possibly acute diastolic heart failure I explained to him today that while his prognosis is guarded I think he is improving and I think he has the chance to improve even more.  I explained that this will take weeks to recover.  He is willing to continue to try. Continue to wean off Precedex, start clonidine to facilitate this Continue clonazepam Continue oxygen via heated high flow, target O2 saturation greater than 85% Out of bed, mobilize Tolerate periods of hypoxemia, goal at rest is greater than 85% SaO2, with movement ideally above 75% Decision for intubation should be based on a change in mental status or physical evidence of ventilatory failure such as nasal flaring, accessory muscle use, paradoxical breathing Out of bed to chair as able Prone positioning while in bed   Anxiety/depression Will periodically make statements like "I want to die, I do not want to live this way anymore", however he contracts for safety, assures me he is not trying to hurt himself, and says that he is willing to continue to press on to try to get better. Frequent orientation Continue music therapy, movies Continue clonazepam and Ativan Wean off Precedex as detailed above, start clonidine Increase BuSpar Continue Effexor  Urinary retention: Continue flomax   Best practice:  Diet: npo for now, may need tube feeding if intubated Pain/Anxiety/Delirium protocol (if indicated): n/a VAP  protocol (if indicated): n/a DVT prophylaxis: lovenox GI prophylaxis: n/a Glucose control: per [facility] Mobility: out of bed today Code Status: full Family Communication: per [facility] Disposition: remain in ICU  Labs   CBC: Recent Labs  Lab 12/24/18 0300 12/24/18 2137 12/25/18 0045 12/26/18 0145 12/27/18 0800 12/28/18 0500  WBC 10.6*  --  9.2 8.1 11.7* 10.8*  NEUTROABS 9.9*  --  8.6* 7.6 10.8* 10.1*  HGB 12.8* 13.9  13.4 12.8* 14.1 12.4*  HCT 36.9* 41.0 38.8* 39.0 42.1 36.6*  MCV 95.8  --  94.2 96.8 96.6 97.1  PLT 338  --  363 370 368 353    Basic Metabolic Panel: Recent Labs  Lab 12/22/18 0415  12/24/18 0300 12/24/18 2137 12/25/18 0045 12/26/18 0145 12/27/18 0800 12/28/18 0500  NA 143   < > 139 139 138 141 139 137  K 4.4   < > 3.9 3.6 4.0 3.8 4.1 4.4  CL 106   < > 103  --  100 101 103 102  CO2 27   < > 28  --  26 29 24 26   GLUCOSE 174*   < > 177*  --  177* 223* 265* 252*  BUN 20   < > 22  --  24* 39* 34* 28*  CREATININE 0.80   < > 0.68  --  0.81 0.92 0.82 0.73  CALCIUM 7.8*   < > 7.9*  --  8.0* 8.3* 8.7* 8.3*  MG 1.9   < > 2.2  --  2.2 2.6* 2.3 2.1  PHOS 4.3  --   --   --   --   --   --   --    < > = values in this interval not displayed.   GFR: Estimated Creatinine Clearance: 81.5 mL/min (by C-G formula based on SCr of 0.73 mg/dL). Recent Labs  Lab 12/25/18 0045 12/26/18 0145 12/27/18 0800 12/28/18 0500  WBC 9.2 8.1 11.7* 10.8*    Liver Function Tests: Recent Labs  Lab 12/23/18 0500 12/24/18 0300 12/25/18 0045 12/26/18 0145 12/28/18 0500  AST 33 29 34 29 41  ALT 67* 56* 54* 49* 94*  ALKPHOS 83 82 87 90 104  BILITOT 0.7 0.5 0.9 0.9 0.5  PROT 5.2* 5.2* 5.4* 5.7* 5.2*  ALBUMIN 2.0* 2.2* 2.2* 2.5* 2.3*   No results for input(s): LIPASE, AMYLASE in the last 168 hours. No results for input(s): AMMONIA in the last 168 hours.  ABG    Component Value Date/Time   PHART 7.629 (HH) 12/24/2018 2137   PCO2ART 27.1 (L) 12/24/2018 2137   PO2ART 47.0 (L) 12/24/2018 2137   HCO3 28.5 (H) 12/24/2018 2137   TCO2 29 12/24/2018 2137   O2SAT 91.0 12/24/2018 2137     Coagulation Profile: No results for input(s): INR, PROTIME in the last 168 hours.  Cardiac Enzymes: Recent Labs  Lab 12/22/18 0415  CKTOTAL 15*    HbA1C: Hgb A1c MFr Bld  Date/Time Value Ref Range Status  12/21/2018 02:00 AM 6.5 (H) 4.8 - 5.6 % Final    Comment:    (NOTE) Pre diabetes:          5.7%-6.4%  Diabetes:              >6.4% Glycemic control for   <7.0% adults with diabetes     CBG: Recent Labs  Lab 12/27/18 0701 12/27/18 1207 12/27/18 1645 12/27/18 2116 12/28/18 0825  GLUCAP 227* 132* 265* 272* 158*    Critical care time: 35 minutes  Brent McQuaid, MD [place] [facility] Pager: [phone] Cell: [phone] If no response, call [phone]  

## 2018-12-28 NOTE — Progress Notes (Signed)
Pt is calling family members, stating "I probably will not see you ever again, I have no purpose, I do not want to live like this." During conversations on the phone, pt is taking Oxygen off, stating "it is easier to speak this way". Pt Alert and Oriented. Will continue to monitor

## 2018-12-28 NOTE — Progress Notes (Signed)
Pt currently talking on cell phone with oxygen off. Pt encouraged to leave oxygen in his nose in which he refused saying that its "blowing in his nose" Pt educated on importance of keeping it on. Spo2  95%.

## 2018-12-28 NOTE — Progress Notes (Signed)
PROGRESS NOTE    Joel Greene  LZJ:[id number] DOB: 10/20/1950 DOA: 12/18/2018 PCP: Redmond School, MD    Brief Narrative:  68 year old male was initially evaluated at a [facility] hospital and transferred to [place] when he tested positive for COVID-19.  He had presented with fevers, chills, shortness of breath.  He became progressively hypoxic and tachypneic.  He was intubated in the emergency room prior to transfer to [facility].  Currently on steroids, remdesivir and antibiotics.  Patient was able to extubate on 6/22.  He has been transitioned to progressive care.  He was transferred to my care on day 5 of his hospital stay on 12/22/2018.   Subjective: Patient in bed resting comfortably, in no distress, no chest or abdominal pain.  Shortness of breath improved.  Assessment & Plan:    1. Acute hypoxic respiratory failure due to COVID-19 interstitial pneumonia.  He was started on IV steroids along with IV REMDESIVIR, clinically improved, briefly intubated for 2 days, extubated on 12/20/2018.  He was transferred to my care on 12/22/2018 on high flow nasal cannula oxygen, he continued to be severely hypoxic hence he received Actemra IV on 12/24/2018 along with convalescent plasma on 12/25/2018.  Being continued on IV steroids dose gradually being tapered.  Inflammatory markers have stabilized.  Continues to have profound hypoxia requiring high flow nasal cannula oxygen but overall now improving gradually, remains physically in ICU, large factor of his hypoxia is his extreme anxiety as well, anxiety and better control with 3 times daily Klonopin and Precedex being on board PRN.  COVID-19 Labs  Recent Labs    12/26/18 0145 12/27/18 0030 12/28/18 0500  DDIMER 1.82*  --  1.51*  FERRITIN 1,789*  --  1,431*  LDH 353* 289* 276*  CRP 4.0*  --  0.8    Lab Results  Component Value Date   SARSCOV2NAA POSITIVE (A) 12/18/2018      2. Sepsis secondary to COVID-19.  Blood  cultures negative stable procalcitonin.  Monitor.  3. Hypokalemia.  Replaced.  Magnesium is normal.  4. Eessential hypertension.  Placed on PRN hydralazine.  5.  BPH.  On alpha-blocker, had evidence of bladder fullness hence Foley was placed on 12/25/2018.  600 cc of urine removed.  6.  Anxiety and insomnia.  Home medications continued, he has been placed on high-dose clonidine, continue trazodone, continue BuSpar along with Neurontin, PRN IV Ativan, also requiring intermittent Precedex drip which seemed to help him quite a bit in terms of his anxiety.  7. OSA -CPAP at night, and using oxygen high flow.  8.  Acute on chronic nonspecific CHF.  No previous echo on file.  Diuresed with high dose Lasix IV on 12/24/2018 will continue monitor, low dose Lasix x 1 being given intermittently IV as needed repeat dose on 12/28/2018.  Outpatient echocardiogram and cardiology follow-up.  9.  Constipation.  Placed on bowel regimen, improved.    DVT prophylaxis: Lovenox Code Status: Full code Family Communication: updated significant other on 6/23, 6/29 Disposition Plan: Transfer to progressive care   Consultants:   PCCM  Procedures:   Intubation 6/20 > 6/22   Remdesivir 6/20   R-Arm PICC  Actemra 12/24/2018  Foley catheter 12/25/2018  Plasma 12/25/18  I, Lala Lund, MD, consented Subject Joel Greene (male, Date of Birth April 14, 1951, 68 y.o.) and with diagnosis of COVID-19, in the [facility] Expanded Access Program (EAP) Research Protocol for [employer] against COVID-19.  The consent took place under  following circumstances.   Subject Capacity assessed by this investigator as:  Presence of adequate emotional and mental capacity to consent with normal ability to read and write.  Consent took place in the following setting(s):  In-room, face to face   The following were present for the consent process:  Investigator   A copy of the cover letter and signed consent  document was provided to subject/LAR.  The original signed consent document has been placed in the subject's physical chart and will be scanned into the electronic medical record upon discharge.  Statement of acknowledgement that the following was discussed with the subject/LAR:    1) Discussed the purpose of the research and procedures  2) Discussed risks and benefits and uncertainties of study participation 3) Discussed subject's responsibilities  4) Discussed the measures in place to maintain subject's confidentiality while a participant on the trial  5) Discussed alternatives to study participation.   6) Discussed study participation is voluntary and that the subject's care would not be jeopardized if they declined participation in the study.   7) Discussed freedom to withdraw at any time.   8) All subject/LAR questions were answered to their satisfaction.   9) In case of emergency consent, investigator agreed to discuss with subject/LAR at earliest available opportunity when the subject stabilizes and/or LAR can be located.     Final Investigator Signature  Lala Lund, [place] [id number]   Date: 12/28/2018 and 9:33 AM     Objective: Vitals:   12/28/18 0600 12/28/18 0700 12/28/18 0748 12/28/18 0800  BP: (!) 112/53 108/60 108/60 119/61  Pulse: 73 70  68  Resp: (!) 24 20 12  (!) 7  Temp:      TempSrc:      SpO2: 95% 96%  92%  Weight:      Height:        Intake/Output Summary (Last 24 hours) at 12/28/2018 0933 Last data filed at 12/28/2018 0400 Gross per 24 hour  Intake 717.57 ml  Output 1220 ml  Net -502.43 ml   Filed Weights   12/25/18 0500 12/27/18 0500 12/28/18 0500  Weight: 78.7 kg 72.8 kg 72.8 kg    Examination:  Resting in bed calmly, in no distress [place].AT,PERRAL Supple Neck,No JVD, No cervical lymphadenopathy appriciated.  Symmetrical Chest wall movement, Good air movement bilaterally, CTAB RRR,No Gallops, Rubs or new Murmurs, No Parasternal Heave +ve  B.Sounds, Abd Soft, No tenderness, No organomegaly appriciated, No rebound - guarding or rigidity. No Cyanosis, Clubbing or edema, No new Rash or bruise    Data Reviewed: I have personally reviewed following labs and imaging studies  CBC: Recent Labs  Lab 12/24/18 0300 12/24/18 2137 12/25/18 0045 12/26/18 0145 12/27/18 0800 12/28/18 0500  WBC 10.6*  --  9.2 8.1 11.7* 10.8*  NEUTROABS 9.9*  --  8.6* 7.6 10.8* 10.1*  HGB 12.8* 13.9 13.4 12.8* 14.1 12.4*  HCT 36.9* 41.0 38.8* 39.0 42.1 36.6*  MCV 95.8  --  94.2 96.8 96.6 97.1  PLT 338  --  363 370 368 903   Basic Metabolic Panel: Recent Labs  Lab 12/22/18 0415  12/24/18 0300 12/24/18 2137 12/25/18 0045 12/26/18 0145 12/27/18 0800 12/28/18 0500  NA 143   < > 139 139 138 141 139 137  K 4.4   < > 3.9 3.6 4.0 3.8 4.1 4.4  CL 106   < > 103  --  100 101 103 102  CO2 27   < > 28  --  26 29  24 26  GLUCOSE 174*   < > 177*  --  177* 223* 265* 252*  BUN 20   < > 22  --  24* 39* 34* 28*  CREATININE 0.80   < > 0.68  --  0.81 0.92 0.82 0.73  CALCIUM 7.8*   < > 7.9*  --  8.0* 8.3* 8.7* 8.3*  MG 1.9   < > 2.2  --  2.2 2.6* 2.3 2.1  PHOS 4.3  --   --   --   --   --   --   --    < > = values in this interval not displayed.   GFR: Estimated Creatinine Clearance: 81.5 mL/min (by C-G formula based on SCr of 0.73 mg/dL). Liver Function Tests: Recent Labs  Lab 12/23/18 0500 12/24/18 0300 12/25/18 0045 12/26/18 0145 12/28/18 0500  AST 33 29 34 29 41  ALT 67* 56* 54* 49* 94*  ALKPHOS 83 82 87 90 104  BILITOT 0.7 0.5 0.9 0.9 0.5  PROT 5.2* 5.2* 5.4* 5.7* 5.2*  ALBUMIN 2.0* 2.2* 2.2* 2.5* 2.3*   No results for input(s): LIPASE, AMYLASE in the last 168 hours. No results for input(s): AMMONIA in the last 168 hours. Coagulation Profile: No results for input(s): INR, PROTIME in the last 168 hours. Cardiac Enzymes: Recent Labs  Lab 12/22/18 0415  CKTOTAL 15*   BNP (last 3 results) No results for input(s): PROBNP in the last 8760  hours. HbA1C: No results for input(s): HGBA1C in the last 72 hours. CBG: Recent Labs  Lab 12/27/18 0701 12/27/18 1207 12/27/18 1645 12/27/18 2116 12/28/18 0825  GLUCAP 227* 132* 265* 272* 158*   Lipid Profile: No results for input(s): CHOL, HDL, LDLCALC, TRIG, CHOLHDL, LDLDIRECT in the last 72 hours. Thyroid Function Tests: No results for input(s): TSH, T4TOTAL, FREET4, T3FREE, THYROIDAB in the last 72 hours. Anemia Panel: Recent Labs    12/26/18 0145 12/28/18 0500  FERRITIN 1,789* 1,431*   Sepsis Labs: No results for input(s): PROCALCITON, LATICACIDVEN in the last 168 hours.  Recent Results (from the past 240 hour(s))  SARS Coronavirus 2 (CEPHEID - Performed in [place] hospital lab), Hosp Order     Status: Abnormal   Collection Time: 12/18/18  1:13 PM   Specimen: Nasopharyngeal Swab  Result Value Ref Range Status   SARS Coronavirus 2 POSITIVE (A) NEGATIVE Final    Comment: CRITICAL RESULT CALLED TO, READ BACK BY AND VERIFIED WITH: A. TUTTLE @1426   12/18/18 KAY (NOTE) If result is NEGATIVE SARS-CoV-2 target nucleic acids are NOT DETECTED. The SARS-CoV-2 RNA is generally detectable in upper and lower  respiratory specimens during the acute phase of infection. The lowest  concentration of SARS-CoV-2 viral copies this assay can detect is 250  copies / mL. A negative result does not preclude SARS-CoV-2 infection  and should not be used as the sole basis for treatment or other  patient management decisions.  A negative result may occur with  improper specimen collection / handling, submission of specimen other  than nasopharyngeal swab, presence of viral mutation(s) within the  areas targeted by this assay, and inadequate number of viral copies  (<250 copies / mL). A negative result must be combined with clinical  observations, patient history, and epidemiological information. If result is POSITIVE SARS-CoV-2 target nucleic acids are DETECTED.  The SARS-CoV-2 RNA is  generally detectable in upper and lower  respiratory specimens during the acute phase of infection.  Positive  results are indicative of active  infection with SARS-CoV-2.  Clinical  correlation with patient history and other diagnostic information is  necessary to determine patient infection status.  Positive results do  not rule out bacterial infection or co-infection with other viruses. If result is PRESUMPTIVE POSTIVE SARS-CoV-2 nucleic acids MAY BE PRESENT.   A presumptive positive result was obtained on the submitted specimen  and confirmed on repeat testing.  While 2019 novel coronavirus  (SARS-CoV-2) nucleic acids may be present in the submitted sample  additional confirmatory testing may be necessary for epidemiological  and / or clinical management purposes  to differentiate between  SARS-CoV-2 and other Sarbecovirus currently known to infect humans.  If clinically indicated additional testing with an alternate test  methodology [phone])  is advised. The SARS-CoV-2 RNA is generally  detectable in upper and lower respiratory specimens during the acute  phase of infection. The expected result is Negative. Fact Sheet for Patients:  StrictlyIdeas.no Fact Sheet for Healthcare Providers: BankingDealers.co.za This test is not yet approved or cleared by the [place] FDA and has been authorized for detection and/or diagnosis of SARS-CoV-2 by FDA under an Emergency Use Authorization (EUA).  This EUA will remain in effect (meaning this test can be used) for the duration of the COVID-19 declaration under Section 564(b)(1) of the Act, 21 U.S.C. section 360bbb-3(b)(1), unless the authorization is terminated or revoked sooner. Performed at [facility], [address] [place], [place] [address]   Blood Culture (routine x 2)     Status: None   Collection Time: 12/18/18  1:44 PM   Specimen: Left Antecubital; Blood  Result Value Ref Range  Status   Specimen Description   Final    LEFT ANTECUBITAL BOTTLES DRAWN AEROBIC AND ANAEROBIC   Special Requests Blood Culture adequate volume  Final   Culture   Final    NO GROWTH 5 DAYS Performed at [facility], [address], [place], [place] [address]    Report Status 12/23/2018 FINAL  Final  Blood Culture (routine x 2)     Status: None   Collection Time: 12/18/18  1:45 PM   Specimen: BLOOD RIGHT WRIST  Result Value Ref Range Status   Specimen Description   Final    BLOOD RIGHT WRIST BOTTLES DRAWN AEROBIC AND ANAEROBIC   Special Requests Blood Culture adequate volume  Final   Culture   Final    NO GROWTH 5 DAYS Performed at [facility], [address] [place], [place] [address]    Report Status 12/23/2018 FINAL  Final  MRSA PCR Screening     Status: None   Collection Time: 12/19/18  5:24 AM   Specimen: Nasal Mucosa; Nasopharyngeal  Result Value Ref Range Status   MRSA by PCR NEGATIVE NEGATIVE Final    Comment:        The GeneXpert MRSA Assay (FDA approved for NASAL specimens only), is one component of a comprehensive MRSA colonization surveillance program. It is not intended to diagnose MRSA infection nor to guide or monitor treatment for MRSA infections. Performed at [facility], [place] [address], [place], [place] [address]   C difficile quick scan w PCR reflex     Status: None   Collection Time: 12/20/18 10:02 PM   Specimen: STOOL  Result Value Ref Range Status   C Diff antigen NEGATIVE NEGATIVE Final   C Diff toxin NEGATIVE NEGATIVE Final   C Diff interpretation No C. difficile detected.  Final    Comment: Performed at [facility],  [place] [address], [place], [place] [address]    Radiology Studies: Dg Chest Port 1 View  Result Date: 12/27/2018 CLINICAL DATA:  Shortness of breath EXAM: PORTABLE CHEST 1 VIEW COMPARISON:  December 25, 2018 FINDINGS: Central catheter tip is in the superior vena cava. No pneumothorax. There is  patchy airspace consolidation in the right mid lung and both lower lung zone regions. There is cardiomegaly with pulmonary vascularity normal. No adenopathy. No bone lesions. IMPRESSION: Stable airspace opacity bilaterally, most likely due to multifocal pneumonia. Stable cardiac prominence. No change in central catheter position. No pneumothorax. Electronically Signed   By: Lowella Grip III M.D.   On: 12/27/2018 08:51     Scheduled Meds: . aspirin  81 mg Oral Daily  . atorvastatin  20 mg Oral Daily  . busPIRone  10 mg Oral BID  . Chlorhexidine Gluconate Cloth  6 each Topical Daily  . cholecalciferol  5,000 Units Oral Daily  . clonazepam  1 mg Oral TID  . enoxaparin (LOVENOX) injection  40 mg Subcutaneous Q12H  . feeding supplement (ENSURE ENLIVE)  237 mL Oral TID BM  . fluticasone  2 spray Each Nare Daily  . furosemide  40 mg Intravenous Once  . gabapentin  300 mg Oral 2 times per day   And  . gabapentin  600 mg Oral QHS  . insulin aspart  0-20 Units Subcutaneous TID WC  . insulin aspart  0-5 Units Subcutaneous QHS  . latanoprost  1 drop Both Eyes QHS  . mouth rinse  15 mL Mouth Rinse BID  . methylPREDNISolone (SOLU-MEDROL) injection  40 mg Intravenous Q12H  . multivitamin with minerals  1 tablet Oral Daily  . pantoprazole  40 mg Oral Daily  . polyethylene glycol  17 g Oral Daily  . saccharomyces boulardii  250 mg Oral BID  . tamsulosin  0.4 mg Oral QPC breakfast  . venlafaxine XR  75 mg Oral Q breakfast   Continuous Infusions: . dexmedetomidine (PRECEDEX) IV infusion 0.4 mcg/kg/hr (12/27/18 2113)     LOS: 10 days   Signature  Lala Lund M.D on 12/28/2018 at 9:33 AM   -  To page go to [email or web address]

## 2018-12-28 NOTE — Progress Notes (Signed)
Pt continuously, sying "there is no purpose in his life. Want to finish this life". MD is aware. Will get Psych on board.  Encoursgements and emotional support was given. During a call with Rose, pt got very anxious, and agitated, when she was trying to encourage him as well.

## 2018-12-28 NOTE — Progress Notes (Signed)
PT Cancellation Note  Patient Details Name: Joel Greene MRN: [id number] DOB: [birth year]   Cancelled Treatment:    Reason Eval/Treat Not Completed: Other (comment)  Attempted to see pt ~1400. Patient talking on his cellphone and RN reported pt very upset, calling people he knows and telling them he wants to die (see Dr. Anastasia Pall previous note). RN reported his sats recently in the 78s and had taken a long time to recover to 48s.   Returned 1553 and pt now sleeping and RN requested to defer PT and allow him to rest. Agreed.  Jeanie Cooks [name], PT 12/28/2018, 3:56 PM

## 2018-12-29 LAB — CBC WITH DIFFERENTIAL/PLATELET
Abs Immature Granulocytes: 0.13 10*3/uL — ABNORMAL HIGH (ref 0.00–0.07)
Basophils Absolute: 0 10*3/uL (ref 0.0–0.1)
Basophils Relative: 0 %
Eosinophils Absolute: 0 10*3/uL (ref 0.0–0.5)
Eosinophils Relative: 0 %
HCT: 39.1 % (ref 39.0–52.0)
Hemoglobin: 12.7 g/dL — ABNORMAL LOW (ref 13.0–17.0)
Immature Granulocytes: 1 %
Lymphocytes Relative: 2 %
Lymphs Abs: 0.3 10*3/uL — ABNORMAL LOW (ref 0.7–4.0)
MCH: 31.8 pg (ref 26.0–34.0)
MCHC: 32.5 g/dL (ref 30.0–36.0)
MCV: 97.8 fL (ref 80.0–100.0)
Monocytes Absolute: 0.4 10*3/uL (ref 0.1–1.0)
Monocytes Relative: 3 %
Neutro Abs: 11.1 10*3/uL — ABNORMAL HIGH (ref 1.7–7.7)
Neutrophils Relative %: 94 %
Platelets: 255 10*3/uL (ref 150–400)
RBC: 4 MIL/uL — ABNORMAL LOW (ref 4.22–5.81)
RDW: 12.7 % (ref 11.5–15.5)
WBC: 11.9 10*3/uL — ABNORMAL HIGH (ref 4.0–10.5)
nRBC: 0 % (ref 0.0–0.2)

## 2018-12-29 LAB — COMPREHENSIVE METABOLIC PANEL
ALT: 131 U/L — ABNORMAL HIGH (ref 0–44)
AST: 48 U/L — ABNORMAL HIGH (ref 15–41)
Albumin: 2.5 g/dL — ABNORMAL LOW (ref 3.5–5.0)
Alkaline Phosphatase: 118 U/L (ref 38–126)
Anion gap: 8 (ref 5–15)
BUN: 26 mg/dL — ABNORMAL HIGH (ref 8–23)
CO2: 27 mmol/L (ref 22–32)
Calcium: 8.5 mg/dL — ABNORMAL LOW (ref 8.9–10.3)
Chloride: 100 mmol/L (ref 98–111)
Creatinine, Ser: 0.69 mg/dL (ref 0.61–1.24)
GFR calc Af Amer: >60 mL/min (ref >60)
GFR calc non Af Amer: >60 mL/min (ref >60)
Glucose, Bld: 297 mg/dL — ABNORMAL HIGH (ref 70–99)
Potassium: 4.7 mmol/L (ref 3.5–5.1)
Sodium: 135 mmol/L (ref 135–145)
Total Bilirubin: 0.3 mg/dL (ref 0.3–1.2)
Total Protein: 5.1 g/dL — ABNORMAL LOW (ref 6.5–8.1)

## 2018-12-29 LAB — GLUCOSE, CAPILLARY
Glucose-Capillary: 291 mg/dL — ABNORMAL HIGH (ref 70–99)
Glucose-Capillary: 160 mg/dL — ABNORMAL HIGH (ref 70–99)
Glucose-Capillary: 274 mg/dL — ABNORMAL HIGH (ref 70–99)
Glucose-Capillary: 197 mg/dL — ABNORMAL HIGH (ref 70–99)

## 2018-12-29 LAB — D-DIMER, QUANTITATIVE: D-Dimer, Quant: 1.11 ug/mL-FEU — ABNORMAL HIGH (ref 0.00–0.50)

## 2018-12-29 LAB — LACTATE DEHYDROGENASE: LDH: 253 U/L — ABNORMAL HIGH (ref 98–192)

## 2018-12-29 LAB — BRAIN NATRIURETIC PEPTIDE: B Natriuretic Peptide: 90 pg/mL (ref 0.0–100.0)

## 2018-12-29 LAB — MAGNESIUM: Magnesium: 2.1 mg/dL (ref 1.7–2.4)

## 2018-12-29 LAB — C-REACTIVE PROTEIN: CRP: 0.9 mg/dL (ref <1.0)

## 2018-12-29 LAB — FERRITIN: Ferritin: 1460 ng/mL — ABNORMAL HIGH (ref 24–336)

## 2018-12-29 MED ORDER — FUROSEMIDE 10 MG/ML IJ SOLN
60.0000 mg | Freq: Once | INTRAMUSCULAR | Status: AC
  Administered 2018-12-29: 60 mg via INTRAVENOUS
  Filled 2018-12-29: qty 6

## 2018-12-29 MED ORDER — INSULIN GLARGINE 100 UNIT/ML ~~LOC~~ SOLN
20.0000 [IU] | Freq: Every day | SUBCUTANEOUS | Status: DC
  Administered 2018-12-29: 09:00:00 20 [IU] via SUBCUTANEOUS
  Filled 2018-12-29 (×2): qty 0.2

## 2018-12-29 NOTE — Progress Notes (Addendum)
PROGRESS NOTE    Joel Greene  ZOX:[id number] DOB: 04-24-1951 DOA: 12/18/2018 PCP: Redmond School, MD    Brief Narrative:  68 year old male was initially evaluated at a [facility] hospital and transferred to [place] when he tested positive for COVID-19.  He had presented with fevers, chills, shortness of breath.  He became progressively hypoxic and tachypneic.  He was intubated in the emergency room prior to transfer to [facility].  Currently on steroids, remdesivir and antibiotics.  Patient was able to extubate on 6/22.  He has been transitioned to progressive care.  He was transferred to my care on day 5 of his hospital stay on 12/22/2018.   Subjective: Shin in bed, feels good, no chest pain, says shortness of breath is improving.  In good spirits today.  Assessment & Plan:    1. Acute hypoxic respiratory failure due to COVID-19 interstitial pneumonia.  He was started on IV steroids along with IV REMDESIVIR, clinically improved, briefly intubated for 2 days, extubated on 12/20/2018.  He was transferred to my care on 12/22/2018 on high flow nasal cannula oxygen, he continued to be severely hypoxic hence he received Actemra IV on 12/24/2018 along with Convalescent Plasma on 12/25/2018.  Being continued on IV steroids dose gradually being tapered.  Inflammatory markers have stabilized.  Continues to have profound hypoxia requiring high flow nasal cannula oxygen but overall now improving gradually, remains physically in ICU, large factor of his hypoxia is his extreme anxiety as well, anxiety and better control with 3 times daily Klonopin and Precedex being on board PRN.  COVID-19 Labs  Recent Labs    12/27/18 0030 12/28/18 0500 12/29/18 0455  DDIMER  --  1.51* 1.11*  FERRITIN  --  1,431* 1,460*  LDH 289* 276* 253*  CRP  --  0.8 0.9    Lab Results  Component Value Date   SARSCOV2NAA POSITIVE (A) 12/18/2018      2. Sepsis secondary to COVID-19.  Blood cultures  negative stable procalcitonin.  Monitor.  3. Hypokalemia.  Replaced.  Magnesium is normal.  4. Eessential hypertension.  Placed on PRN hydralazine.  5.  BPH.  On alpha-blocker, had evidence of bladder fullness hence Foley was placed on 12/25/2018.  600 cc of urine removed.  6.  Anxiety and insomnia.  Home medications continued, he has been placed on high-dose clonidine, continue trazodone, continue BuSpar along with Neurontin, PRN IV Ativan, also requiring intermittent Precedex drip which seemed to help him quite a bit in terms of his anxiety.  7. OSA -CPAP at night, and using oxygen high flow.  8.  Acute on chronic nonspecific CHF.  No previous echo on file.  Diuresed with high dose Lasix IV on 12/24/2018 will continue monitor, low dose Lasix x 1 being given intermittently IV as needed repeat dose on 12/29/2018.  Outpatient echocardiogram and cardiology follow-up.  9.  Constipation.  Placed on bowel regimen, improved.  10.  Steroid-induced hyperglycemia placed on Lantus along with sliding scale will monitor and adjust as needed.  CBG (last 3)  Recent Labs    12/28/18 1619 12/28/18 2101 12/29/18 0738  GLUCAP 344* 312* 291*      DVT prophylaxis: Lovenox Code Status: DNR Family Communication: updated significant other on 6/23, 6/29 Disposition Plan: Transfer to progressive care   Consultants:   PCCM  Procedures:    Intubation 6/20 > 6/22  Remdesivir 6/20   R-Arm PICC 12/19/18  Actemra 12/24/2018  Foley catheter 12/25/2018  Plasma 12/25/18  Objective: Vitals:   12/29/18 0800 12/29/18 0900 12/29/18 0901 12/29/18 1000  BP: 140/84  (!) 142/78 120/75  Pulse: 96 (!) 108  90  Resp: (!) 23 (!) 39  (!) 35  Temp:      TempSrc:      SpO2: 92% (!) 80%  94%  Weight:      Height:        Intake/Output Summary (Last 24 hours) at 12/29/2018 1024 Last data filed at 12/29/2018 1000 Gross per 24 hour  Intake 2445.63 ml  Output 2380 ml  Net 65.63 ml   Filed Weights    12/27/18 0500 12/28/18 0500 12/29/18 0500  Weight: 72.8 kg 72.8 kg 76.4 kg    Examination:  Awake Alert, Oriented X 3, No new F.N deficits, Normal affect [place].AT,PERRAL Supple Neck,No JVD, No cervical lymphadenopathy appriciated.  Symmetrical Chest wall movement, Good air movement bilaterally, CTAB RRR,No Gallops, Rubs or new Murmurs, No Parasternal Heave +ve B.Sounds, Abd Soft, No tenderness, No organomegaly appriciated, No rebound - guarding or rigidity. No Cyanosis, Clubbing or edema, No new Rash or bruise   Data Reviewed: I have personally reviewed following labs and imaging studies  CBC: Recent Labs  Lab 12/25/18 0045 12/26/18 0145 12/27/18 0800 12/28/18 0500 12/29/18 0455  WBC 9.2 8.1 11.7* 10.8* 11.9*  NEUTROABS 8.6* 7.6 10.8* 10.1* 11.1*  HGB 13.4 12.8* 14.1 12.4* 12.7*  HCT 38.8* 39.0 42.1 36.6* 39.1  MCV 94.2 96.8 96.6 97.1 97.8  PLT 363 370 368 293 169   Basic Metabolic Panel: Recent Labs  Lab 12/25/18 0045 12/26/18 0145 12/27/18 0800 12/28/18 0500 12/29/18 0455  NA 138 141 139 137 135  K 4.0 3.8 4.1 4.4 4.7  CL 100 101 103 102 100  CO2 26 29 24 26 27   GLUCOSE 177* 223* 265* 252* 297*  BUN 24* 39* 34* 28* 26*  CREATININE 0.81 0.92 0.82 0.73 0.69  CALCIUM 8.0* 8.3* 8.7* 8.3* 8.5*  MG 2.2 2.6* 2.3 2.1 2.1   GFR: Estimated Creatinine Clearance: 81.5 mL/min (by C-G formula based on SCr of 0.69 mg/dL). Liver Function Tests: Recent Labs  Lab 12/24/18 0300 12/25/18 0045 12/26/18 0145 12/28/18 0500 12/29/18 0455  AST 29 34 29 41 48*  ALT 56* 54* 49* 94* 131*  ALKPHOS 82 87 90 104 118  BILITOT 0.5 0.9 0.9 0.5 0.3  PROT 5.2* 5.4* 5.7* 5.2* 5.1*  ALBUMIN 2.2* 2.2* 2.5* 2.3* 2.5*   No results for input(s): LIPASE, AMYLASE in the last 168 hours. No results for input(s): AMMONIA in the last 168 hours. Coagulation Profile: No results for input(s): INR, PROTIME in the last 168 hours. Cardiac Enzymes: No results for input(s): CKTOTAL, CKMB, CKMBINDEX,  TROPONINI in the last 168 hours. BNP (last 3 results) No results for input(s): PROBNP in the last 8760 hours. HbA1C: No results for input(s): HGBA1C in the last 72 hours. CBG: Recent Labs  Lab 12/28/18 0825 12/28/18 1221 12/28/18 1619 12/28/18 2101 12/29/18 0738  GLUCAP 158* 228* 344* 312* 291*   Lipid Profile: No results for input(s): CHOL, HDL, LDLCALC, TRIG, CHOLHDL, LDLDIRECT in the last 72 hours. Thyroid Function Tests: No results for input(s): TSH, T4TOTAL, FREET4, T3FREE, THYROIDAB in the last 72 hours. Anemia Panel: Recent Labs    12/28/18 0500 12/29/18 0455  FERRITIN 1,431* 1,460*   Sepsis Labs: No results for input(s): PROCALCITON, LATICACIDVEN in the last 168 hours.  Recent Results (from the past 240 hour(s))  C difficile quick scan w PCR reflex  Status: None   Collection Time: 12/20/18 10:02 PM   Specimen: STOOL  Result Value Ref Range Status   C Diff antigen NEGATIVE NEGATIVE Final   C Diff toxin NEGATIVE NEGATIVE Final   C Diff interpretation No C. difficile detected.  Final    Comment: Performed at [facility], [place] [address], [place], [place] [address]    Radiology Studies: No results found.   Scheduled Meds: . aspirin  81 mg Oral Daily  . atorvastatin  20 mg Oral Daily  . busPIRone  15 mg Oral BID  . Chlorhexidine Gluconate Cloth  6 each Topical Daily  . cholecalciferol  5,000 Units Oral Daily  . clonazepam  1 mg Oral TID  . cloNIDine  0.1 mg Oral TID  . enoxaparin (LOVENOX) injection  40 mg Subcutaneous Q12H  . feeding supplement (ENSURE ENLIVE)  237 mL Oral TID BM  . fluticasone  2 spray Each Nare Daily  . gabapentin  300 mg Oral 2 times per day   And  . gabapentin  600 mg Oral QHS  . insulin aspart  0-20 Units Subcutaneous TID WC  . insulin aspart  0-5 Units Subcutaneous QHS  . insulin glargine  20 Units Subcutaneous Daily  . latanoprost  1 drop Both Eyes QHS  . mouth rinse  15 mL Mouth Rinse BID  .  methylPREDNISolone (SOLU-MEDROL) injection  40 mg Intravenous Q12H  . multivitamin with minerals  1 tablet Oral Daily  . pantoprazole  40 mg Oral Daily  . polyethylene glycol  17 g Oral Daily  . saccharomyces boulardii  250 mg Oral BID  . tamsulosin  0.4 mg Oral QPC breakfast  . venlafaxine XR  75 mg Oral Q breakfast   Continuous Infusions: . sodium chloride 10 mL/hr at 12/29/18 0700  . dexmedetomidine (PRECEDEX) IV infusion 0.6 mcg/kg/hr (12/29/18 0910)     LOS: 11 days   Signature  Lala Lund M.D on 12/29/2018 at 10:24 AM   -  To page go to [email or web address]

## 2018-12-29 NOTE — Progress Notes (Signed)
RN patient placed back on [place] @ 40L Fio2 80% due to increased WOB, RR while being cleaned and bed changed. Spo2 currently at 95%.

## 2018-12-29 NOTE — Progress Notes (Signed)
PT Cancellation Note  Patient Details Name: Joel Greene MRN: [id number] DOB: Sep 10, 1950   Cancelled Treatment:    Reason Eval/Treat Not Completed: Patient declined,stated that he is writing something very important. RN will let me kn ow when pt. Ready to  Mobilize.  [name]- 12/29/2018, 10:48 AM336-

## 2018-12-29 NOTE — Progress Notes (Signed)
NAME:  Joel Greene, MRN:  [id number], DOB:  [birth year], LOS: 40 ADMISSION DATE:  12/18/2018, CONSULTATION DATE:  6/20 REFERRING MD:  TRH-Singh, CHIEF COMPLAINT:  Dyspnea  Brief History   68 y/o male with a history of bronchitis presented with dyspnea and hypoxemia, initially required intubation on 6/20.  Extubated on 6/22.   Has had worsening hypoxemia felt to be related to agitation.  Past Medical History  Bronchitis Inflammatory bowel syndrome Anxiety Depression Hyperlipidemia GERD  Significant Hospital Events   6/20 intubation for COVID  6/22 extubated 6/27 moved back to ICU, hypoxemic.  Started on Precedex for agitation  Consults:  PCCM  Procedures:  6/20 ETT > 6/22  6/21 R arm PICC >   Significant Diagnostic Tests:    Micro Data:  6/20 SARS-COV2> positive 6/20 blood > negative 6/22 c diff > negative   Antimicrobials:  6/20 zosyn x1 6/20 vanc x1 6/21 azithro > 6/23 6/21 ceftriaxone > 6/23 6/20 remdesivir >  6/26 actemra x1 6/26  Convalescent plasma  Interim history/subjective:   Appears better today.  More calm.  Feels Precedex is helping.  No longer expressing thoughts of doom.  Objective   Blood pressure (!) 142/78, pulse 92, temperature 97.7 F (36.5 C), temperature source Oral, resp. rate (!) 33, height 5\' 7"  (1.702 m), weight 76.4 kg, SpO2 94 %.    FiO2 (%):  [40 %-100 %] 40 %   Intake/Output Summary (Last 24 hours) at 12/29/2018 9163 Last data filed at 12/29/2018 0700 Gross per 24 hour  Intake 2643.81 ml  Output 2650 ml  Net -6.19 ml   Filed Weights   12/27/18 0500 12/28/18 0500 12/29/18 0500  Weight: 72.8 kg 72.8 kg 76.4 kg    Examination:  General:  Resting comfortably in bed HENT: NCAT OP clear PULM: CTA B, normal effort, crackles CV: RRR, no mgr, no JVD GI: BS+, soft, nontender MSK: normal bulk and tone Neuro: awake, alert, no distress, MAEW Psyche: anxious   Resolved Hospital Problem list     Assessment & Plan:  ARDS  due to SARS-COV2: Oxygenation and oxygen needs worse in setting of panic and anxiety, improved with Precedex Possibly acute diastolic heart failure Diurese again today Continue to wean off Precedex, started clonidine to facilitate this Continue clonazepam Continue oxygen via heated high flow, target O2 saturation greater than 85% Out of bed, mobilize Tolerate periods of hypoxemia, goal at rest is greater than 85% SaO2, with movement ideally above 75% Decision for intubation should be based on a change in mental status or physical evidence of ventilatory failure such as nasal flaring, accessory muscle use, paradoxical breathing Out of bed to chair as able Prone positioning while in bed   Anxiety/depression Mental outlook has improved Frequent orientation Continue music therapy, movies Continue clonazepam and Ativan Wean off Precedex as detailed above, start clonidine Increase BuSpar Continue Effexor  Urinary retention: Continue flomax   Best practice:  Diet: Diet as tolerated. Pain/Anxiety/Delirium protocol (if indicated): Weaning Precedex infusion VAP protocol (if indicated): n/a DVT prophylaxis: lovenox GI prophylaxis: n/a Glucose control: per TRH Mobility: out of bed today Code Status: full Family Communication: per [facility] Disposition: remain in ICU  Labs   CBC: Recent Labs  Lab 12/25/18 0045 12/26/18 0145 12/27/18 0800 12/28/18 0500 12/29/18 0455  WBC 9.2 8.1 11.7* 10.8* 11.9*  NEUTROABS 8.6* 7.6 10.8* 10.1* 11.1*  HGB 13.4 12.8* 14.1 12.4* 12.7*  HCT 38.8* 39.0 42.1 36.6* 39.1  MCV 94.2 96.8 96.6 97.1 97.8  PLT 363 370 368 293 557    Basic Metabolic Panel: Recent Labs  Lab 12/25/18 0045 12/26/18 0145 12/27/18 0800 12/28/18 0500 12/29/18 0455  NA 138 141 139 137 135  K 4.0 3.8 4.1 4.4 4.7  CL 100 101 103 102 100  CO2 26 29 24 26 27   GLUCOSE 177* 223* 265* 252* 297*  BUN 24* 39* 34* 28* 26*  CREATININE 0.81 0.92 0.82 0.73 0.69  CALCIUM 8.0* 8.3*  8.7* 8.3* 8.5*  MG 2.2 2.6* 2.3 2.1 2.1   GFR: Estimated Creatinine Clearance: 81.5 mL/min (by C-G formula based on SCr of 0.69 mg/dL). Recent Labs  Lab 12/26/18 0145 12/27/18 0800 12/28/18 0500 12/29/18 0455  WBC 8.1 11.7* 10.8* 11.9*    Liver Function Tests: Recent Labs  Lab 12/24/18 0300 12/25/18 0045 12/26/18 0145 12/28/18 0500 12/29/18 0455  AST 29 34 29 41 48*  ALT 56* 54* 49* 94* 131*  ALKPHOS 82 87 90 104 118  BILITOT 0.5 0.9 0.9 0.5 0.3  PROT 5.2* 5.4* 5.7* 5.2* 5.1*  ALBUMIN 2.2* 2.2* 2.5* 2.3* 2.5*   No results for input(s): LIPASE, AMYLASE in the last 168 hours. No results for input(s): AMMONIA in the last 168 hours.  ABG    Component Value Date/Time   PHART 7.629 (HH) 12/24/2018 2137   PCO2ART 27.1 (L) 12/24/2018 2137   PO2ART 47.0 (L) 12/24/2018 2137   HCO3 28.5 (H) 12/24/2018 2137   TCO2 29 12/24/2018 2137   O2SAT 91.0 12/24/2018 2137     Coagulation Profile: No results for input(s): INR, PROTIME in the last 168 hours.  Cardiac Enzymes: No results for input(s): CKTOTAL, CKMB, CKMBINDEX, TROPONINI in the last 168 hours.  HbA1C: Hgb A1c MFr Bld  Date/Time Value Ref Range Status  12/21/2018 02:00 AM 6.5 (H) 4.8 - 5.6 % Final    Comment:    (NOTE) Pre diabetes:          5.7%-6.4% Diabetes:              >6.4% Glycemic control for   <7.0% adults with diabetes     CBG: Recent Labs  Lab 12/28/18 0825 12/28/18 1221 12/28/18 1619 12/28/18 2101 12/29/18 [place], [place] ICU Physician [facility]  Pager: [phone] Mobile: [phone] After hours: [phone].  12/29/2018, 9:12 AM

## 2018-12-30 LAB — CBC WITH DIFFERENTIAL/PLATELET
Abs Immature Granulocytes: 0.19 10*3/uL — ABNORMAL HIGH (ref 0.00–0.07)
Basophils Absolute: 0 10*3/uL (ref 0.0–0.1)
Basophils Relative: 0 %
Eosinophils Absolute: 0 10*3/uL (ref 0.0–0.5)
Eosinophils Relative: 0 %
HCT: 40.4 % (ref 39.0–52.0)
Hemoglobin: 13.2 g/dL (ref 13.0–17.0)
Immature Granulocytes: 2 %
Lymphocytes Relative: 3 %
Lymphs Abs: 0.3 10*3/uL — ABNORMAL LOW (ref 0.7–4.0)
MCH: 32 pg (ref 26.0–34.0)
MCHC: 32.7 g/dL (ref 30.0–36.0)
MCV: 98.1 fL (ref 80.0–100.0)
Monocytes Absolute: 0.3 10*3/uL (ref 0.1–1.0)
Monocytes Relative: 3 %
Neutro Abs: 9.2 10*3/uL — ABNORMAL HIGH (ref 1.7–7.7)
Neutrophils Relative %: 92 %
Platelets: 264 10*3/uL (ref 150–400)
RBC: 4.12 MIL/uL — ABNORMAL LOW (ref 4.22–5.81)
RDW: 13.1 % (ref 11.5–15.5)
WBC: 10.1 10*3/uL (ref 4.0–10.5)
nRBC: 0.2 % (ref 0.0–0.2)

## 2018-12-30 LAB — COMPREHENSIVE METABOLIC PANEL
ALT: 148 U/L — ABNORMAL HIGH (ref 0–44)
AST: 53 U/L — ABNORMAL HIGH (ref 15–41)
Albumin: 2.5 g/dL — ABNORMAL LOW (ref 3.5–5.0)
Alkaline Phosphatase: 111 U/L (ref 38–126)
Anion gap: 14 (ref 5–15)
BUN: 29 mg/dL — ABNORMAL HIGH (ref 8–23)
CO2: 27 mmol/L (ref 22–32)
Calcium: 8.6 mg/dL — ABNORMAL LOW (ref 8.9–10.3)
Chloride: 96 mmol/L — ABNORMAL LOW (ref 98–111)
Creatinine, Ser: 0.85 mg/dL (ref 0.61–1.24)
GFR calc Af Amer: >60 mL/min (ref >60)
GFR calc non Af Amer: >60 mL/min (ref >60)
Glucose, Bld: 262 mg/dL — ABNORMAL HIGH (ref 70–99)
Potassium: 4.4 mmol/L (ref 3.5–5.1)
Sodium: 137 mmol/L (ref 135–145)
Total Bilirubin: 0.5 mg/dL (ref 0.3–1.2)
Total Protein: 5.2 g/dL — ABNORMAL LOW (ref 6.5–8.1)

## 2018-12-30 LAB — BRAIN NATRIURETIC PEPTIDE: B Natriuretic Peptide: 77.9 pg/mL (ref 0.0–100.0)

## 2018-12-30 LAB — GLUCOSE, CAPILLARY
Glucose-Capillary: 158 mg/dL — ABNORMAL HIGH (ref 70–99)
Glucose-Capillary: 277 mg/dL — ABNORMAL HIGH (ref 70–99)
Glucose-Capillary: 378 mg/dL — ABNORMAL HIGH (ref 70–99)
Glucose-Capillary: 158 mg/dL — ABNORMAL HIGH (ref 70–99)

## 2018-12-30 LAB — LACTATE DEHYDROGENASE: LDH: 249 U/L — ABNORMAL HIGH (ref 98–192)

## 2018-12-30 LAB — MAGNESIUM: Magnesium: 2.3 mg/dL (ref 1.7–2.4)

## 2018-12-30 LAB — FERRITIN: Ferritin: 1343 ng/mL — ABNORMAL HIGH (ref 24–336)

## 2018-12-30 LAB — C-REACTIVE PROTEIN: CRP: <0.8 mg/dL (ref <1.0)

## 2018-12-30 LAB — D-DIMER, QUANTITATIVE: D-Dimer, Quant: 1.05 ug/mL-FEU — ABNORMAL HIGH (ref 0.00–0.50)

## 2018-12-30 MED ORDER — METHYLPREDNISOLONE SODIUM SUCC 40 MG IJ SOLR
40.0000 mg | Freq: Every day | INTRAMUSCULAR | Status: DC
  Administered 2018-12-30 – 2018-12-31 (×2): 40 mg via INTRAVENOUS
  Filled 2018-12-30: qty 1

## 2018-12-30 MED ORDER — LORAZEPAM 2 MG/ML IJ SOLN
0.5000 mg | INTRAMUSCULAR | Status: DC | PRN
  Administered 2018-12-31 – 2019-01-01 (×3): 0.5 mg via INTRAVENOUS
  Filled 2018-12-30 (×3): qty 1

## 2018-12-30 MED ORDER — CLONAZEPAM 0.5 MG PO TBDP
2.0000 mg | ORAL_TABLET | Freq: Two times a day (BID) | ORAL | Status: DC
  Administered 2018-12-30 – 2018-12-31 (×3): 2 mg via ORAL
  Filled 2018-12-30 (×2): qty 4

## 2018-12-30 MED ORDER — INSULIN GLARGINE 100 UNIT/ML ~~LOC~~ SOLN
15.0000 [IU] | Freq: Every day | SUBCUTANEOUS | Status: DC
  Administered 2018-12-30 – 2018-12-31 (×2): 15 [IU] via SUBCUTANEOUS
  Filled 2018-12-30 (×2): qty 0.15

## 2018-12-30 MED ORDER — FUROSEMIDE 10 MG/ML IJ SOLN
40.0000 mg | Freq: Once | INTRAMUSCULAR | Status: AC
  Administered 2018-12-30: 40 mg via INTRAVENOUS
  Filled 2018-12-30: qty 4

## 2018-12-30 MED ORDER — CYCLOBENZAPRINE HCL 5 MG PO TABS
7.5000 mg | ORAL_TABLET | Freq: Three times a day (TID) | ORAL | Status: DC | PRN
  Filled 2018-12-30: qty 1.5

## 2018-12-30 NOTE — Progress Notes (Signed)
PT Cancellation Note  Patient Details Name: Joel Greene MRN: [id number] DOB: February 07, 1951   Cancelled Treatment:    Reason Eval/Treat Not Completed: Medical issues which prohibited therapy at ~8:15 RN reports that patient was  Assisted OOB to recliner and was recovering form SOB. Will check back another time.   [name] 12/30/2018, 2:46 PM

## 2018-12-30 NOTE — Plan of Care (Signed)
  Problem: Pain Managment: Goal: General experience of comfort will improve Outcome: Progressing   

## 2018-12-30 NOTE — Progress Notes (Signed)
Alert and oriented x  4. Remains afebrile. Out of bed and sitting in recliner. Dyspnea on exertion noted. O2 SAT fluctuated between 70-80%. O2 via nasal canula increased from 8 liters to 10 liters with no improvement in O2 SAT. Nasal canula is now at 15 liters and O2 SAT is at 100%. Call bell within reach. Encouraged to report signs and symptoms to RN.

## 2018-12-30 NOTE — Progress Notes (Signed)
NAME:  Joel Greene, MRN:  [id number], DOB:  January 19, 1951, LOS: 70 ADMISSION DATE:  12/18/2018, CONSULTATION DATE:  6/20 REFERRING MD:  TRH-Singh, CHIEF COMPLAINT:  Dyspnea  Brief History   68 y/o male with a history of bronchitis presented with dyspnea and hypoxemia, initially required intubation on 6/20.  Extubated on 6/22.   Has had worsening hypoxemia felt to be related to agitation.  Past Medical History  Bronchitis Inflammatory bowel syndrome Anxiety Depression Hyperlipidemia GERD  Significant Hospital Events   6/20 intubation for COVID  6/22 extubated 6/27 moved back to ICU, hypoxemic.  Started on Precedex for agitation  Consults:  PCCM  Procedures:  6/20 ETT > 6/22  6/21 R arm PICC >   Significant Diagnostic Tests:    Micro Data:  6/20 SARS-COV2> positive 6/20 blood > negative 6/22 c diff > negative   Antimicrobials:  6/20 zosyn x1 6/20 vanc x1 6/21 azithro > 6/23 6/21 ceftriaxone > 6/23 6/20 remdesivir >  6/26 actemra x1 6/26  Convalescent plasma  Interim history/subjective:   Generally calm but remains on Precedex.  He stated anxiety is driven by the sensation of dyspnea when he moves.  Objective   Blood pressure 107/68, pulse 64, temperature 97.7 F (36.5 C), temperature source Axillary, resp. rate 16, height 5\' 7"  (1.702 m), weight 74.8 kg, SpO2 100 %.    FiO2 (%):  [30 %-80 %] 30 %   Intake/Output Summary (Last 24 hours) at 12/30/2018 1213 Last data filed at 12/30/2018 0400 Gross per 24 hour  Intake 890.93 ml  Output 1440 ml  Net -549.07 ml   Filed Weights   12/28/18 0500 12/29/18 0500 12/30/18 0458  Weight: 72.8 kg 76.4 kg 74.8 kg    Examination:  General:  Resting comfortably in bed HENT: NCAT OP clear PULM: CTA B, normal effort, crackles bilaterally remain unchanged. CV: RRR, no mgr, no JVD GI: BS+, soft, nontender MSK: normal bulk and tone Neuro: awake, alert, no distress, MAEW Psyche: anxious   Resolved Hospital Problem  list     Assessment & Plan:  ARDS due to SARS-COV2: Oxygenation and oxygen needs worse in setting of panic and anxiety, improved with Precedex Possibly acute diastolic heart failure Continue diuresis tolerated Continue to wean off Precedex, started clonidine to facilitate this Continue clonazepam Continue oxygen via heated high flow, target O2 saturation greater than 85% Out of bed, mobilize Tolerate periods of hypoxemia, goal at rest is greater than 85% SaO2, with movement ideally above 75% Decision for intubation should be based on a change in mental status or physical evidence of ventilatory failure such as nasal flaring, accessory muscle use, paradoxical breathing Out of bed to chair as able Prone positioning while in bed   Anxiety/depression Mental outlook has improved Frequent orientation Continue music therapy, movies Continue clonazepam and Ativan Wean off Precedex as detailed above, start clonidine Increase BuSpar Continue Effexor  Urinary retention: Continue flomax   Best practice:  Diet: Diet as tolerated. Pain/Anxiety/Delirium protocol (if indicated): Weaning Precedex infusion VAP protocol (if indicated): n/a DVT prophylaxis: lovenox GI prophylaxis: n/a Glucose control: per TRH Mobility: out of bed today Code Status: full Family Communication: I have advised the patient the recovery will be slow. Disposition: remain in ICU  Labs   CBC: Recent Labs  Lab 12/26/18 0145 12/27/18 0800 12/28/18 0500 12/29/18 0455 12/30/18 0230  WBC 8.1 11.7* 10.8* 11.9* 10.1  NEUTROABS 7.6 10.8* 10.1* 11.1* 9.2*  HGB 12.8* 14.1 12.4* 12.7* 13.2  HCT 39.0 42.1  36.6* 39.1 40.4  MCV 96.8 96.6 97.1 97.8 98.1  PLT 370 368 293 255 024    Basic Metabolic Panel: Recent Labs  Lab 12/26/18 0145 12/27/18 0800 12/28/18 0500 12/29/18 0455 12/30/18 0230  NA 141 139 137 135 137  K 3.8 4.1 4.4 4.7 4.4  CL 101 103 102 100 96*  CO2 29 24 26 27 27   GLUCOSE 223* 265* 252* 297*  262*  BUN 39* 34* 28* 26* 29*  CREATININE 0.92 0.82 0.73 0.69 0.85  CALCIUM 8.3* 8.7* 8.3* 8.5* 8.6*  MG 2.6* 2.3 2.1 2.1 2.3   GFR: Estimated Creatinine Clearance: 76.7 mL/min (by C-G formula based on SCr of 0.85 mg/dL). Recent Labs  Lab 12/27/18 0800 12/28/18 0500 12/29/18 0455 12/30/18 0230  WBC 11.7* 10.8* 11.9* 10.1    Liver Function Tests: Recent Labs  Lab 12/25/18 0045 12/26/18 0145 12/28/18 0500 12/29/18 0455 12/30/18 0230  AST 34 29 41 48* 53*  ALT 54* 49* 94* 131* 148*  ALKPHOS 87 90 104 118 111  BILITOT 0.9 0.9 0.5 0.3 0.5  PROT 5.4* 5.7* 5.2* 5.1* 5.2*  ALBUMIN 2.2* 2.5* 2.3* 2.5* 2.5*   No results for input(s): LIPASE, AMYLASE in the last 168 hours. No results for input(s): AMMONIA in the last 168 hours.  ABG    Component Value Date/Time   PHART 7.629 (HH) 12/24/2018 2137   PCO2ART 27.1 (L) 12/24/2018 2137   PO2ART 47.0 (L) 12/24/2018 2137   HCO3 28.5 (H) 12/24/2018 2137   TCO2 29 12/24/2018 2137   O2SAT 91.0 12/24/2018 2137     Coagulation Profile: No results for input(s): INR, PROTIME in the last 168 hours.  Cardiac Enzymes: No results for input(s): CKTOTAL, CKMB, CKMBINDEX, TROPONINI in the last 168 hours.  HbA1C: Hgb A1c MFr Bld  Date/Time Value Ref Range Status  12/21/2018 02:00 AM 6.5 (H) 4.8 - 5.6 % Final    Comment:    (NOTE) Pre diabetes:          5.7%-6.4% Diabetes:              >6.4% Glycemic control for   <7.0% adults with diabetes     CBG: Recent Labs  Lab 12/29/18 0738 12/29/18 1201 12/29/18 1749 12/29/18 2128 12/30/18 [place], [place] [facility] ICU Physician [place]  Pager: [phone] Mobile: [phone] After hours: [phone].  12/30/2018, 12:13 PM

## 2018-12-30 NOTE — Progress Notes (Signed)
Spoke with Significant other Rose, all questions answered. Rose stated that patient has been recently taken off of Xanax.

## 2018-12-30 NOTE — Progress Notes (Signed)
PROGRESS NOTE    Joel Greene  ZRA:[id number] DOB: [birth year] DOA: 12/18/2018 PCP: Redmond School, MD    Brief Narrative:  68 year old male was initially evaluated at a [facility] hospital and transferred to [place] when he tested positive for COVID-19.  He had presented with fevers, chills, shortness of breath.  He became progressively hypoxic and tachypneic.  He was intubated in the emergency room prior to transfer to [facility].  Currently on steroids, remdesivir and antibiotics.  Patient was able to extubate on 6/22.  He has been transitioned to progressive care.  He was transferred to my care on day 5 of his hospital stay on 12/22/2018.   Subjective: Patient in chaor, appears comfortable, denies any headache, no fever, no chest pain or pressure, no shortness of breath , no abdominal pain. No focal weakness.   Assessment & Plan:    1. Acute hypoxic respiratory failure due to COVID-19 interstitial pneumonia + EXTREME ANXIETY.  He was started on IV steroids along with IV REMDESIVIR, clinically improved, briefly intubated for 2 days, extubated on 12/20/2018.  He was transferred to my care on 12/22/2018 on high flow nasal cannula oxygen, he continued to be severely hypoxic hence he received Actemra IV on 12/24/2018 along with Convalescent Plasma on 12/25/2018.  Being continued on IV steroids dose gradually being tapered.  Inflammatory markers have stabilized.  Continues to have profound hypoxia requiring high flow nasal cannula oxygen but overall now improving gradually, remains physically in ICU, large factor of his hypoxia is his extreme anxiety as well, anxiety and better control with high dose Klonopin, Buspar, Effexor and Precedex being on board PRN.  With anxiety and control on 12/30/2018 he is currently on 15 L nasal cannula oxygen.  Down from a high flow 40 L with 100% FiO2 few hours ago.  COVID-19 Labs  Recent Labs    12/28/18 0500 12/29/18 0455 12/30/18 0230  DDIMER  1.51* 1.11* 1.05*  FERRITIN 1,431* 1,460* 1,343*  LDH 276* 253* 249*  CRP 0.8 0.9 <0.8    Lab Results  Component Value Date   SARSCOV2NAA POSITIVE (A) 12/18/2018      2. Sepsis secondary to COVID-19.  Blood cultures negative stable procalcitonin.  Monitor.  3. Hypokalemia.  Replaced.  Magnesium is normal.  4. Eessential hypertension.  Placed on PRN hydralazine.  5.  BPH.  On alpha-blocker, had evidence of bladder fullness hence Foley was placed on 12/25/2018.  600 cc of urine removed.  6.  Anxiety and insomnia.  Home medications continued, he has been placed on clonidine, Klonopin twice daily, Effexor, BuSpar along with Neurontin, PRN IV Precedex drip, Zaidi in much better control now.  7. OSA -CPAP at night, and using oxygen high flow.  8.  Acute on chronic nonspecific CHF.  No previous echo on file.  Diuresed with high dose Lasix IV on 12/24/2018 will continue monitor, low dose Lasix x 1 being given intermittently IV as needed repeat dose on 12/29/2018.  Outpatient echocardiogram and cardiology follow-up.  9.  Constipation.  Placed on bowel regimen, improved.  10.  Steroid-induced hyperglycemia placed on Lantus along with sliding scale will monitor and adjust as needed.  CBG (last 3)  Recent Labs    12/29/18 1201 12/29/18 1749 12/29/18 2128  GLUCAP 160* 274* 197*      DVT prophylaxis: Lovenox Code Status: DNR Family Communication: updated significant other on 6/23, 6/29 Disposition Plan: Transfer to progressive care   Consultants:   PCCM  Procedures:  Intubation 6/20 > 6/22  Remdesivir 6/20   R-Arm PICC 12/19/18  Actemra 12/24/2018  Foley catheter 12/25/2018  Plasma 12/25/18   Objective: Vitals:   12/30/18 0458 12/30/18 0500 12/30/18 0600 12/30/18 0800  BP:  112/76 110/68 116/73  Pulse:  66 65 81  Resp:  (!) 25 18 (!) 34  Temp:      TempSrc:      SpO2:  95% 96% 99%  Weight: 74.8 kg     Height:        Intake/Output Summary (Last 24 hours) at  12/30/2018 0906 Last data filed at 12/30/2018 0400 Gross per 24 hour  Intake 1356.33 ml  Output 2140 ml  Net -783.67 ml   Filed Weights   12/28/18 0500 12/29/18 0500 12/30/18 0458  Weight: 72.8 kg 76.4 kg 74.8 kg    Examination:  Awake Alert, Oriented X 3, No new F.N deficits, anxious affect [place].AT,PERRAL Supple Neck,No JVD, No cervical lymphadenopathy appriciated.  Symmetrical Chest wall movement, Good air movement bilaterally, few rales RRR,No Gallops, Rubs or new Murmurs, No Parasternal Heave +ve B.Sounds, Abd Soft, No tenderness, No organomegaly appriciated, No rebound - guarding or rigidity. No Cyanosis, Clubbing or edema, No new Rash or bruise   Data Reviewed: I have personally reviewed following labs and imaging studies  CBC: Recent Labs  Lab 12/26/18 0145 12/27/18 0800 12/28/18 0500 12/29/18 0455 12/30/18 0230  WBC 8.1 11.7* 10.8* 11.9* 10.1  NEUTROABS 7.6 10.8* 10.1* 11.1* 9.2*  HGB 12.8* 14.1 12.4* 12.7* 13.2  HCT 39.0 42.1 36.6* 39.1 40.4  MCV 96.8 96.6 97.1 97.8 98.1  PLT 370 368 293 255 725   Basic Metabolic Panel: Recent Labs  Lab 12/26/18 0145 12/27/18 0800 12/28/18 0500 12/29/18 0455 12/30/18 0230  NA 141 139 137 135 137  K 3.8 4.1 4.4 4.7 4.4  CL 101 103 102 100 96*  CO2 29 24 26 27 27   GLUCOSE 223* 265* 252* 297* 262*  BUN 39* 34* 28* 26* 29*  CREATININE 0.92 0.82 0.73 0.69 0.85  CALCIUM 8.3* 8.7* 8.3* 8.5* 8.6*  MG 2.6* 2.3 2.1 2.1 2.3   GFR: Estimated Creatinine Clearance: 76.7 mL/min (by C-G formula based on SCr of 0.85 mg/dL). Liver Function Tests: Recent Labs  Lab 12/25/18 0045 12/26/18 0145 12/28/18 0500 12/29/18 0455 12/30/18 0230  AST 34 29 41 48* 53*  ALT 54* 49* 94* 131* 148*  ALKPHOS 87 90 104 118 111  BILITOT 0.9 0.9 0.5 0.3 0.5  PROT 5.4* 5.7* 5.2* 5.1* 5.2*  ALBUMIN 2.2* 2.5* 2.3* 2.5* 2.5*   No results for input(s): LIPASE, AMYLASE in the last 168 hours. No results for input(s): AMMONIA in the last 168 hours.  Coagulation Profile: No results for input(s): INR, PROTIME in the last 168 hours. Cardiac Enzymes: No results for input(s): CKTOTAL, CKMB, CKMBINDEX, TROPONINI in the last 168 hours. BNP (last 3 results) No results for input(s): PROBNP in the last 8760 hours. HbA1C: No results for input(s): HGBA1C in the last 72 hours. CBG: Recent Labs  Lab 12/28/18 2101 12/29/18 0738 12/29/18 1201 12/29/18 1749 12/29/18 2128  GLUCAP 312* 291* 160* 274* 197*   Lipid Profile: No results for input(s): CHOL, HDL, LDLCALC, TRIG, CHOLHDL, LDLDIRECT in the last 72 hours. Thyroid Function Tests: No results for input(s): TSH, T4TOTAL, FREET4, T3FREE, THYROIDAB in the last 72 hours. Anemia Panel: Recent Labs    12/29/18 0455 12/30/18 0230  FERRITIN 1,460* 1,343*   Sepsis Labs: No results for input(s): PROCALCITON, LATICACIDVEN in  the last 168 hours.  Recent Results (from the past 240 hour(s))  C difficile quick scan w PCR reflex     Status: None   Collection Time: 12/20/18 10:02 PM   Specimen: STOOL  Result Value Ref Range Status   C Diff antigen NEGATIVE NEGATIVE Final   C Diff toxin NEGATIVE NEGATIVE Final   C Diff interpretation No C. difficile detected.  Final    Comment: Performed at [facility], [place] [address] [place],  [address]    Radiology Studies: No results found.   Scheduled Meds: . aspirin  81 mg Oral Daily  . atorvastatin  20 mg Oral Daily  . busPIRone  15 mg Oral BID  . Chlorhexidine Gluconate Cloth  6 each Topical Daily  . cholecalciferol  5,000 Units Oral Daily  . clonazepam  2 mg Oral BID  . cloNIDine  0.1 mg Oral TID  . enoxaparin (LOVENOX) injection  40 mg Subcutaneous Q12H  . feeding supplement (ENSURE ENLIVE)  237 mL Oral TID BM  . fluticasone  2 spray Each Nare Daily  . furosemide  40 mg Intravenous Once  . gabapentin  300 mg Oral 2 times per day   And  . gabapentin  600 mg Oral QHS  . insulin aspart  0-20 Units Subcutaneous TID  WC  . insulin aspart  0-5 Units Subcutaneous QHS  . insulin glargine  15 Units Subcutaneous Daily  . latanoprost  1 drop Both Eyes QHS  . mouth rinse  15 mL Mouth Rinse BID  . methylPREDNISolone (SOLU-MEDROL) injection  40 mg Intravenous Daily  . multivitamin with minerals  1 tablet Oral Daily  . pantoprazole  40 mg Oral Daily  . polyethylene glycol  17 g Oral Daily  . saccharomyces boulardii  250 mg Oral BID  . tamsulosin  0.4 mg Oral QPC breakfast  . venlafaxine XR  75 mg Oral Q breakfast   Continuous Infusions: . sodium chloride 10 mL/hr at 12/30/18 0200  . dexmedetomidine (PRECEDEX) IV infusion 0.8 mcg/kg/hr (12/30/18 0753)     LOS: 12 days   Signature  Lala Lund M.D on 12/30/2018 at 9:06 AM   -  To page go to [email or web address]

## 2018-12-30 NOTE — Progress Notes (Signed)
Occupational Therapy Treatment Patient Details Name: Joel Greene MRN: [id number] DOB: [birth year] Today's Date: 12/30/2018    History of present illness Pt is a 68 y.o. male admitted 12/18/18 with fever, chills and SOB; became progressively hypoxic and tachypnic, tested postiive for COVID-19. ETT 6/20-6/22. PMH includes OSA, IBS, DJD, glaucoma.   OT comments  Pt initially was a little grumpy and not wanting to participate with OT. Pt allowed to finish his food/drinks/icie and @ 1 hr later was agreeable to stand to change his pads in his chair. Pt assisted with washing his upper body while seated. Time spent talking with pt about what kind of music he enjoyed. Pt asked to play Vanice Sarah because it was relaxing. Engaged pt in relaxation techniques using music to reduce his anxiety with mobility. Pt then asked to play "Lillia Abed was a [employer]" and told this therapist all about the history of the Temptations while cycling on the LE bike. At one point, pt began singing along with the music. Bike left for pt to work on while he watched a documentary on the Temptations. Pt's mood was much improved at the end of the session and pt expressing gratitude for therapist working with him.  Theraband and Bike left in room for pt to work with. Recommend use of music during sessions as pt appears to enjoy this.  Pt will most likely need rehab at SNF prior to return home.  SpO2 varied form 95 - low 70s during session. RR at times into the 50s - then in 20s.   Follow Up Recommendations  SNF;Supervision/Assistance - 24 hour    Equipment Recommendations  3 in 1 bedside commode    Recommendations for Other Services      Precautions / Restrictions Precautions Precautions: Fall Precaution Comments: monitor vitals; very very anxious       Mobility Bed Mobility               General bed mobility comments: OOB in cahir  Transfers Overall transfer level: Needs assistance Equipment used: Rolling walker (2  wheeled) Transfers: Sit to/from Stand Sit to Stand: Min assist              Balance     Sitting balance-Leahy Scale: Fair Sitting balance - Comments: Pt sliding down in chair but is able to help reposition himself if he wants to  Postural control: (pt readjusts when he wants to readjust)   Standing balance-Leahy Scale: Poor Standing balance comment: reliant on use of RW                           ADL either performed or assessed with clinical judgement   ADL Overall ADL's : Needs assistance/impaired Eating/Feeding: Set up   Grooming: Set up;Sitting   Upper Body Bathing: Minimal assistance;Sitting   Lower Body Bathing: Maximal assistance;Sit to/from stand                       Functional mobility during ADLs: Minimal assistance;Rolling walker(sit - stand)       Vision       Perception     Praxis      Cognition Arousal/Alertness: Lethargic;Suspect due to medications Behavior During Therapy: Flat affect Overall Cognitive Status: Impaired/Different from baseline Area of Impairment: Orientation;Attention;Safety/judgement;Awareness;Problem solving;Memory                 Orientation Level: Disoriented to;Time Current Attention Level: Sustained Memory: Decreased  short-term memory   Safety/Judgement: Decreased awareness of safety;Decreased awareness of deficits Awareness: Emergent Problem Solving: Slow processing;Decreased initiation General Comments: Pt upset about "everything". Pt given time to finish eating, pt grumbling about food, food chhoices, his care. Explained need to clarify his demand for gingerale, applesause and icies due to how they affect his sugar level. Pt with poor insight and continued to ask for same drinks/food.         Exercises Other Exercises Other Exercises: bike x  8 min with multiple rest breaks   Shoulder Instructions       General Comments      Pertinent Vitals/ Pain       Pain Assessment:  Faces Faces Pain Scale: Hurts little more Pain Location: "everything" Pain Descriptors / Indicators: Discomfort Pain Intervention(s): Limited activity within patient's tolerance  Home Living                                          Prior Functioning/Environment              Frequency  Min 3X/week        Progress Toward Goals  OT Goals(current goals can now be found in the care plan section)  Progress towards OT goals: Progressing toward goals  Acute Rehab OT Goals Patient Stated Goal: to breath better OT Goal Formulation: With patient Time For Goal Achievement: 01/06/19 Potential to Achieve Goals: Good ADL Goals Pt Will Perform Lower Body Bathing: with modified independence;with adaptive equipment;sit to/from stand Pt Will Perform Lower Body Dressing: with modified independence;with adaptive equipment;sit to/from stand Pt Will Transfer to Toilet: Independently;ambulating;bedside commode Pt Will Perform Toileting - Clothing Manipulation and hygiene: with modified independence;sit to/from stand Pt Will Perform Tub/Shower Transfer: with supervision;ambulating;3 in 1;Tub transfer Pt/caregiver will Perform Home Exercise Program: Increased strength;With theraband;With written HEP provided;Independently Additional ADL Goal #1: Pt will independently verbalize 3 energy conservation strategies for ADL adn IADL tasks  Plan Discharge plan remains appropriate    Co-evaluation                 AM-PAC OT "6 Clicks" Daily Activity     Outcome Measure   Help from another person eating meals?: None Help from another person taking care of personal grooming?: A Little Help from another person toileting, which includes using toliet, bedpan, or urinal?: A Lot Help from another person bathing (including washing, rinsing, drying)?: A Lot Help from another person to put on and taking off regular upper body clothing?: A Little Help from another person to put on and  taking off regular lower body clothing?: A Lot 6 Click Score: 16    End of Session Equipment Utilized During Treatment: Oxygen(15L)  OT Visit Diagnosis: Unsteadiness on feet (R26.81);Muscle weakness (generalized) (M62.81);Pain Pain - part of body: (back)   Activity Tolerance Patient tolerated treatment well   Patient Left in chair;with call bell/phone within reach;with nursing/sitter in room   Nurse Communication Mobility status;Other (comment)(encourage theraband and use of bike)        Time: 1610-1700 OT Time Calculation (min): 50 min  Charges: OT General Charges $OT Visit: 1 Visit OT Treatments $Self Care/Home Management : 8-22 mins $Therapeutic Activity: 8-22 mins $Therapeutic Exercise: 8-22 mins  Maurie Boettcher, OT/L   Acute OT Clinical Specialist [place] Pager [phone] Office [phone]    [facility] 12/30/2018, 6:14 PM

## 2018-12-31 DIAGNOSIS — F410 Panic disorder [episodic paroxysmal anxiety] without agoraphobia: Secondary | ICD-10-CM

## 2018-12-31 LAB — COMPREHENSIVE METABOLIC PANEL
ALT: 133 U/L — ABNORMAL HIGH (ref 0–44)
AST: 36 U/L (ref 15–41)
Albumin: 2.7 g/dL — ABNORMAL LOW (ref 3.5–5.0)
Alkaline Phosphatase: 103 U/L (ref 38–126)
Anion gap: 11 (ref 5–15)
BUN: 33 mg/dL — ABNORMAL HIGH (ref 8–23)
CO2: 28 mmol/L (ref 22–32)
Calcium: 8.8 mg/dL — ABNORMAL LOW (ref 8.9–10.3)
Chloride: 99 mmol/L (ref 98–111)
Creatinine, Ser: 0.71 mg/dL (ref 0.61–1.24)
GFR calc Af Amer: >60 mL/min (ref >60)
GFR calc non Af Amer: >60 mL/min (ref >60)
Glucose, Bld: 145 mg/dL — ABNORMAL HIGH (ref 70–99)
Potassium: 4.1 mmol/L (ref 3.5–5.1)
Sodium: 138 mmol/L (ref 135–145)
Total Bilirubin: 0.7 mg/dL (ref 0.3–1.2)
Total Protein: 5.5 g/dL — ABNORMAL LOW (ref 6.5–8.1)

## 2018-12-31 LAB — CBC WITH DIFFERENTIAL/PLATELET
Abs Immature Granulocytes: 0.19 10*3/uL — ABNORMAL HIGH (ref 0.00–0.07)
Basophils Absolute: 0 10*3/uL (ref 0.0–0.1)
Basophils Relative: 0 %
Eosinophils Absolute: 0 10*3/uL (ref 0.0–0.5)
Eosinophils Relative: 0 %
HCT: 42.9 % (ref 39.0–52.0)
Hemoglobin: 14.7 g/dL (ref 13.0–17.0)
Immature Granulocytes: 2 %
Lymphocytes Relative: 10 %
Lymphs Abs: 0.9 10*3/uL (ref 0.7–4.0)
MCH: 33.3 pg (ref 26.0–34.0)
MCHC: 34.3 g/dL (ref 30.0–36.0)
MCV: 97.1 fL (ref 80.0–100.0)
Monocytes Absolute: 0.5 10*3/uL (ref 0.1–1.0)
Monocytes Relative: 5 %
Neutro Abs: 7.4 10*3/uL (ref 1.7–7.7)
Neutrophils Relative %: 83 %
Platelets: 293 10*3/uL (ref 150–400)
RBC: 4.42 MIL/uL (ref 4.22–5.81)
RDW: 13.3 % (ref 11.5–15.5)
WBC: 9 10*3/uL (ref 4.0–10.5)
nRBC: 0.2 % (ref 0.0–0.2)

## 2018-12-31 LAB — D-DIMER, QUANTITATIVE: D-Dimer, Quant: 1.12 ug/mL-FEU — ABNORMAL HIGH (ref 0.00–0.50)

## 2018-12-31 LAB — BRAIN NATRIURETIC PEPTIDE: B Natriuretic Peptide: 54.7 pg/mL (ref 0.0–100.0)

## 2018-12-31 LAB — GLUCOSE, CAPILLARY
Glucose-Capillary: 302 mg/dL — ABNORMAL HIGH (ref 70–99)
Glucose-Capillary: 150 mg/dL — ABNORMAL HIGH (ref 70–99)
Glucose-Capillary: 286 mg/dL — ABNORMAL HIGH (ref 70–99)
Glucose-Capillary: 123 mg/dL — ABNORMAL HIGH (ref 70–99)

## 2018-12-31 LAB — LACTATE DEHYDROGENASE: LDH: 228 U/L — ABNORMAL HIGH (ref 98–192)

## 2018-12-31 LAB — C-REACTIVE PROTEIN: CRP: <0.8 mg/dL (ref <1.0)

## 2018-12-31 LAB — FERRITIN: Ferritin: 1336 ng/mL — ABNORMAL HIGH (ref 24–336)

## 2018-12-31 LAB — MAGNESIUM: Magnesium: 2.4 mg/dL (ref 1.7–2.4)

## 2018-12-31 MED ORDER — CLONAZEPAM 0.5 MG PO TBDP
1.0000 mg | ORAL_TABLET | Freq: Two times a day (BID) | ORAL | Status: DC
  Administered 2018-12-31 – 2019-01-13 (×26): 1 mg via ORAL
  Filled 2018-12-31 (×26): qty 2

## 2018-12-31 MED ORDER — DIVALPROEX SODIUM 250 MG PO DR TAB
250.0000 mg | DELAYED_RELEASE_TABLET | Freq: Two times a day (BID) | ORAL | Status: DC
  Administered 2018-12-31 – 2019-01-13 (×27): 250 mg via ORAL
  Filled 2018-12-31 (×30): qty 1

## 2018-12-31 MED ORDER — SODIUM CHLORIDE 0.9 % IV BOLUS
1000.0000 mL | Freq: Once | INTRAVENOUS | Status: AC
  Administered 2018-12-31: 1000 mL via INTRAVENOUS

## 2018-12-31 MED ORDER — CYCLOBENZAPRINE HCL 5 MG PO TABS
5.0000 mg | ORAL_TABLET | Freq: Three times a day (TID) | ORAL | Status: DC | PRN
  Administered 2019-01-04: 10:00:00 5 mg via ORAL
  Filled 2018-12-31 (×2): qty 1

## 2018-12-31 MED ORDER — CLONIDINE HCL 0.1 MG PO TABS: 0.1000 mg | ORAL_TABLET | Freq: Every day | ORAL | Status: DC

## 2018-12-31 MED ORDER — DEXTROSE 50 % IV SOLN
INTRAVENOUS | Status: AC
  Administered 2018-12-31: 12:00:00 25 mL
  Filled 2018-12-31: qty 50

## 2018-12-31 MED ORDER — METHYLPREDNISOLONE SODIUM SUCC 40 MG IJ SOLR: 20.0000 mg | Freq: Every day | INTRAMUSCULAR | Status: DC

## 2018-12-31 MED ORDER — INSULIN ASPART 100 UNIT/ML ~~LOC~~ SOLN
0.0000 [IU] | Freq: Three times a day (TID) | SUBCUTANEOUS | Status: DC
  Administered 2018-12-31: 17:00:00 5 [IU] via SUBCUTANEOUS
  Administered 2018-12-31: 1 [IU] via SUBCUTANEOUS
  Administered 2019-01-01 (×2): 3 [IU] via SUBCUTANEOUS
  Administered 2019-01-02: 08:00:00 2 [IU] via SUBCUTANEOUS
  Administered 2019-01-02 – 2019-01-03 (×3): 3 [IU] via SUBCUTANEOUS
  Administered 2019-01-03: 2 [IU] via SUBCUTANEOUS
  Administered 2019-01-03 – 2019-01-04 (×2): 3 [IU] via SUBCUTANEOUS
  Administered 2019-01-04: 10:00:00 1 [IU] via SUBCUTANEOUS
  Administered 2019-01-04: 17:00:00 3 [IU] via SUBCUTANEOUS
  Administered 2019-01-05: 12:00:00 1 [IU] via SUBCUTANEOUS
  Administered 2019-01-05: 17:00:00 5 [IU] via SUBCUTANEOUS
  Administered 2019-01-05: 3 [IU] via SUBCUTANEOUS
  Administered 2019-01-06: 2 [IU] via SUBCUTANEOUS
  Administered 2019-01-06: 17:00:00 1 [IU] via SUBCUTANEOUS
  Administered 2019-01-06: 13:00:00 3 [IU] via SUBCUTANEOUS
  Administered 2019-01-07 (×2): 2 [IU] via SUBCUTANEOUS
  Administered 2019-01-07: 09:00:00 1 [IU] via SUBCUTANEOUS
  Administered 2019-01-08 (×2): 3 [IU] via SUBCUTANEOUS
  Administered 2019-01-09: 1 [IU] via SUBCUTANEOUS
  Administered 2019-01-09 – 2019-01-10 (×2): 3 [IU] via SUBCUTANEOUS
  Administered 2019-01-10: 18:00:00 2 [IU] via SUBCUTANEOUS
  Administered 2019-01-10: 10:00:00 1 [IU] via SUBCUTANEOUS
  Administered 2019-01-11: 18:00:00 2 [IU] via SUBCUTANEOUS
  Administered 2019-01-12: 13:00:00 3 [IU] via SUBCUTANEOUS
  Administered 2019-01-12 – 2019-01-13 (×2): 1 [IU] via SUBCUTANEOUS
  Administered 2019-01-13: 13:00:00 2 [IU] via SUBCUTANEOUS

## 2018-12-31 MED ORDER — METHYLPREDNISOLONE SODIUM SUCC 40 MG IJ SOLR
40.0000 mg | Freq: Every day | INTRAMUSCULAR | Status: DC
  Administered 2019-01-01 – 2019-01-02 (×2): 40 mg via INTRAVENOUS
  Filled 2018-12-31 (×2): qty 1

## 2018-12-31 MED ORDER — INSULIN GLARGINE 100 UNIT/ML ~~LOC~~ SOLN
10.0000 [IU] | Freq: Every day | SUBCUTANEOUS | Status: DC
  Administered 2019-01-01 – 2019-01-13 (×13): 10 [IU] via SUBCUTANEOUS
  Filled 2018-12-31 (×13): qty 0.1

## 2018-12-31 NOTE — Progress Notes (Signed)
NAME:  Joel Greene, MRN:  [id number], DOB:  August 22, 1950, LOS: 12 ADMISSION DATE:  12/18/2018, CONSULTATION DATE:  6/20 REFERRING MD:  TR[facility]-Singh, CHIEF COMPLAINT:  Dyspnea  Brief History   68 y/o male with a history of bronchitis presented with dyspnea and hypoxemia, initially required intubation on 6/20.  Extubated on 6/22.  Has had worsening hypoxemia.   Past Medical History  Bronchitis Inflammatory bowel syndrome Anxiety Depression Hyperlipidemia GERD  Significant Hospital Events   6/20 intubation for COVID  6/22 extubated 6/27 moved back to ICU, hypoxemic 6/28-July 3 > titrating sedation, oxygenation slowly improving from heated high flow to salter nasal cannula, remains dependent on precedex  Consults:  PCCM  Procedures:  6/20 ETT > 6/22  6/21 R arm PICC >   Significant Diagnostic Tests:    Micro Data:  6/20 SARS-COV2> positive 6/20 blood > negative 6/22 c diff > negative   Antimicrobials:  6/20 zosyn x1 6/20 vanc x1 6/21 azithro > 6/23 6/21 ceftriaxone > 6/23 6/20 remdesivir >  6/26 actemra x1 6/26  Convalescent plasma  Interim history/subjective:   Oxygenation improved Currently on salter high flow 10 L Breathing has improved Needs to get out of bed Remains on Precedex Had one episode of severe panic on July  Objective   Blood pressure (!) 94/56, pulse 60, temperature (!) 97 F (36.1 C), temperature source Axillary, resp. rate (!) 21, height 5\' 7"  (1.702 m), weight 74.8 kg, SpO2 99 %.    FiO2 (%):  [100 %] 100 %   Intake/Output Summary (Last 24 hours) at 12/31/2018 0739 Last data filed at 12/31/2018 0700 Gross per 24 hour  Intake 1196.78 ml  Output 1000 ml  Net 196.78 ml   Filed Weights   12/28/18 0500 12/29/18 0500 12/30/18 0458  Weight: 72.8 kg 76.4 kg 74.8 kg    Examination:  General:  Resting comfortably in bed HENT: NCAT OP clear PULM: CTA B, normal effort CV: RRR, no mgr GI: BS+, soft, nontender MSK: normal bulk and tone  Neuro: awake, alert, no distress, MAEW   Resolved Hospital Problem list     Assessment & Plan:  ARDS due to SARS-COV2: Oxygenation and oxygen needs worse in setting of panic and anxiety, improved with Precedex Continue to wean off oxygen, continue using high flow via salter nasal cannula Tolerate periods of hypoxemia, goal at rest is greater than 85% SaO2, with movement ideally above 75% Decision for intubation should be based on a change in mental status or physical evidence of ventilatory failure such as nasal flaring, accessory muscle use, paradoxical breathing Out of bed to chair as able Prone positioning while in bed   Anxiety/depression Severe panic episodes Consider depakote: will discuss on rounds today Continue frequent orientation, out of bed, music therapy, movies Continue clonazepam, buspar, effexor Continue efforts to wean off precedex  Urinary retention: Flomax   Best practice:  Diet: npo for now, may need tube feeding if intubated Pain/Anxiety/Delirium protocol (if indicated): n/a VAP protocol (if indicated): n/a DVT prophylaxis: lovenox GI prophylaxis: n/a Glucose control: per TR[facility] Mobility: out of bed today Code Status: full Family Communication: per [facility] Disposition: remain in ICU  Labs   CBC: Recent Labs  Lab 12/27/18 0800 12/28/18 0500 12/29/18 0455 12/30/18 0230 12/31/18 0500  WBC 11.7* 10.8* 11.9* 10.1 9.0  NEUTROABS 10.8* 10.1* 11.1* 9.2* 7.4  HGB 14.1 12.4* 12.7* 13.2 14.7  HCT 42.1 36.6* 39.1 40.4 42.9  MCV 96.6 97.1 97.8 98.1 97.1  PLT 368 293  255 264 828    Basic Metabolic Panel: Recent Labs  Lab 12/27/18 0800 12/28/18 0500 12/29/18 0455 12/30/18 0230 12/31/18 0500  NA 139 137 135 137 138  K 4.1 4.4 4.7 4.4 4.1  CL 103 102 100 96* 99  CO2 24 26 27 27 28   GLUCOSE 265* 252* 297* 262* 145*  BUN 34* 28* 26* 29* 33*  CREATININE 0.82 0.73 0.69 0.85 0.71  CALCIUM 8.7* 8.3* 8.5* 8.6* 8.8*  MG 2.3 2.1 2.1 2.3 2.4   GFR:  Estimated Creatinine Clearance: 81.5 mL/min (by C-G formula based on SCr of 0.71 mg/dL). Recent Labs  Lab 12/28/18 0500 12/29/18 0455 12/30/18 0230 12/31/18 0500  WBC 10.8* 11.9* 10.1 9.0    Liver Function Tests: Recent Labs  Lab 12/26/18 0145 12/28/18 0500 12/29/18 0455 12/30/18 0230 12/31/18 0500  AST 29 41 48* 53* 36  ALT 49* 94* 131* 148* 133*  ALKPHOS 90 104 118 111 103  BILITOT 0.9 0.5 0.3 0.5 0.7  PROT 5.7* 5.2* 5.1* 5.2* 5.5*  ALBUMIN 2.5* 2.3* 2.5* 2.5* 2.7*   No results for input(s): LIPASE, AMYLASE in the last 168 hours. No results for input(s): AMMONIA in the last 168 hours.  ABG    Component Value Date/Time   PHART 7.629 (HH) 12/24/2018 2137   PCO2ART 27.1 (L) 12/24/2018 2137   PO2ART 47.0 (L) 12/24/2018 2137   HCO3 28.5 (H) 12/24/2018 2137   TCO2 29 12/24/2018 2137   O2SAT 91.0 12/24/2018 2137     Coagulation Profile: No results for input(s): INR, PROTIME in the last 168 hours.  Cardiac Enzymes: No results for input(s): CKTOTAL, CKMB, CKMBINDEX, TROPONINI in the last 168 hours.  HbA1C: Hgb A1c MFr Bld  Date/Time Value Ref Range Status  12/21/2018 02:00 AM 6.5 (H) 4.8 - 5.6 % Final    Comment:    (NOTE) Pre diabetes:          5.7%-6.4% Diabetes:              >6.4% Glycemic control for   <7.0% adults with diabetes     CBG: Recent Labs  Lab 12/29/18 2128 12/30/18 0948 12/30/18 1206 12/30/18 1638 12/30/18 2125  GLUCAP 197* 158* 277* 378* 158*    Critical care time: 31 minutes       Roselie Awkward, MD [place] [facility] Pager: [phone] Cell: [phone] If no response, call [phone]

## 2018-12-31 NOTE — Progress Notes (Signed)
Patient refuses to get up and out of bed to the recliner. Patient was educated on the importance of sitting in recliner but still declined. Patient stated "I do not want to I am comfortable in the bed". On 10 liters nasal canula with O2 SAT 95-96% at rest.Call bell within reach. Encouraged to report signs and symptoms to RN.

## 2018-12-31 NOTE — Progress Notes (Signed)
Nutrition Follow-up  DOCUMENTATION CODES:   Not applicable  INTERVENTION:   Continue to offer Ensure Enlive po TID, each supplement provides 350 kcal and 20 grams of protein.  Continue Hormel Shake daily with Breakfast which provides 520 kcals and 22 g of protein and Magic cup BID with lunch and dinner, each supplement provides 290 kcal and 9 grams of protein, automatically on meal trays to optimize nutritional intake.   NUTRITION DIAGNOSIS:   Inadequate oral intake related to acute illness as evidenced by per patient/family report.  Ongoing   GOAL:   Patient will meet greater than or equal to 90% of their needs  progressing  MONITOR:   PO intake, Supplement acceptance, Labs  ASSESSMENT:   68 yo male admitted with COVID-19 related acute respiratory failure requiring intubation at [facility] and transferred to [facility].  PMH includes bronchitis, IBS.  Patient is consuming 25-75% of meals.   He is on 10 L high flow oxygen. Labs reviewed. CBG's: [phone] Medications reviewed and include precedex, MVI, Solu-medrol, Novolog, vitamin D, Lasix. Weight trending back down with diuresis. I/O net -5.4 L  Diet Order:   Diet Order            Diet Carb Modified Fluid consistency: Thin; Room service appropriate? Yes  Diet effective now              EDUCATION NEEDS:   Not appropriate for education at this time  Skin:  Skin Assessment: Reviewed RN Assessment(MASD to anus, scrotum)  Last BM:  6/30  Height:   Ht Readings from Last 1 Encounters:  12/19/18 5\' 7"  (1.702 m)    Weight:   Wt Readings from Last 1 Encounters:  12/30/18 74.8 kg    Ideal Body Weight:  67.3 kg  BMI:  Body mass index is 25.83 kg/m.  Estimated Nutritional Needs:   Kcal:  1900-2100 kcals  Protein:  95-105 g  Fluid:  >/= 1.9 L    Molli Barrows, RD, LDN, CNSC Pager [phone] After Hours [place] [phone]

## 2018-12-31 NOTE — Progress Notes (Addendum)
PROGRESS NOTE    Joel Greene  FIE:[id number] DOB: [birth year] DOA: 12/18/2018 PCP: Redmond School, MD    Brief Narrative:  68 year old male was initially evaluated at a [facility] hospital and transferred to [place] when he tested positive for COVID-19.  He had presented with fevers, chills, shortness of breath.  He became progressively hypoxic and tachypneic.  He was intubated in the emergency room prior to transfer to [facility].  Currently on steroids, remdesivir and antibiotics.  Patient was able to extubate on 6/22.  He has been transitioned to progressive care.  He was transferred to my care on day 5 of his hospital stay on 12/22/2018.   Subjective: Patient in bed, appears comfortable, denies any headache, no fever, no chest pain or pressure, no shortness of breath , no abdominal pain. No focal weakness.  Assessment & Plan:    1. Acute hypoxic respiratory failure due to COVID-19 interstitial pneumonia + EXTREME ANXIETY.  He was started on IV steroids along with IV REMDESIVIR, clinically improved, briefly intubated for 2 days, extubated on 12/20/2018.  He was transferred to my care on 12/22/2018 on high flow nasal cannula oxygen, he continued to be severely hypoxic hence he received Actemra IV on 12/24/2018 along with Convalescent Plasma on 12/25/2018.  IV steroids being tapered.  Inflammatory markers have stabilized.  Continues to have profound hypoxia requiring high flow nasal cannula oxygen but overall now improving gradually, remains physically in ICU, large factor of his hypoxia is his extreme anxiety as well, anxiety and better control with high dose Klonopin, Buspar, Effexor and Precedex being on board PRN.  With anxiety and control on 12/30/2018 he is currently on 10 L nasal cannula oxygen.  Down from a high flow 40 L with 100% FiO2 24 hours ago.  COVID-19 Labs  Recent Labs    12/29/18 0455 12/30/18 0230 12/31/18 0500  DDIMER 1.11* 1.05* 1.12*  FERRITIN 1,460*  1,343* 1,336*  LDH 253* 249* 228*  CRP 0.9 <0.8 <0.8    Lab Results  Component Value Date   SARSCOV2NAA POSITIVE (A) 12/18/2018      2. Sepsis secondary to COVID-19.  Blood cultures negative stable procalcitonin.  Monitor.  3. Hypokalemia.  Replaced.  Magnesium is normal.  4. Eessential hypertension.  Placed on PRN hydralazine.  Slightly hypotensive on 12/31/2018, discontinue clonidine which was started for anxiety, gentle 1 L bolus and monitor.  5.  BPH.  On alpha-blocker, had evidence of bladder fullness hence Foley was placed on 12/25/2018.  600 cc of urine removed.  6.  Anxiety and insomnia.  Home medications continued, he has been placed on  Klonopin twice daily, Effexor, BuSpar along with Neurontin, PRN IV Precedex drip, Zaidi in much better control now.  7. OSA -CPAP at night, and using oxygen high flow.  8.  Acute on chronic nonspecific CHF.  No previous echo on file.  Diuresed with high dose Lasix IV on 12/24/2018 will continue monitor, he gets intermittent IV Lasix last dose on 12/30/2018.  Outpatient echocardiogram and cardiology follow-up.  Reassess Lasix dose based on clinical assessment.  On 12/31/2018 he is slightly hypotensive and I think he has been slightly over diuresed so we will give him a liter bolus and monitor.  9.  Constipation.  Placed on bowel regimen, improved.  10.  Steroid-induced hyperglycemia placed on Lantus along with sliding scale will monitor and adjust as needed.  CBG (last 3)  Recent Labs    12/30/18 1638 12/30/18 2125 12/31/18  [place]      DVT prophylaxis: Lovenox Code Status: DNR Family Communication: updated significant other on 6/23, 6/29 Disposition Plan: Transfer to progressive care   Consultants:   PCCM  Procedures:    Intubation 6/20 > 6/22  Remdesivir 6/20   R-Arm PICC 12/19/18  Actemra 12/24/2018  Foley catheter 12/25/2018  Plasma 12/25/18   Objective: Vitals:   12/31/18 0600 12/31/18 0700  12/31/18 0750 12/31/18 0800  BP: (!) 80/49 (!) 94/56 (!) 94/56 (!) 92/50  Pulse:  60 63 68  Resp: (!) 27 (!) 21 (!) 29 (!) 25  Temp:    (!) 97.4 F (36.3 C)  TempSrc:    Axillary  SpO2:  99% 93% (!) 87%  Weight:      Height:        Intake/Output Summary (Last 24 hours) at 12/31/2018 1017 Last data filed at 12/31/2018 0700 Gross per 24 hour  Intake 1196.78 ml  Output 1000 ml  Net 196.78 ml   Filed Weights   12/28/18 0500 12/29/18 0500 12/30/18 0458  Weight: 72.8 kg 76.4 kg 74.8 kg    Examination:  Awake Alert, Oriented X 3, No new F.N deficits, Anxious affect [place].AT,PERRAL Supple Neck,No JVD, No cervical lymphadenopathy appriciated.  Symmetrical Chest wall movement, Good air movement bilaterally, CTAB RRR,No Gallops, Rubs or new Murmurs, No Parasternal Heave +ve B.Sounds, Abd Soft, No tenderness, No organomegaly appriciated, No rebound - guarding or rigidity. No Cyanosis, Clubbing or edema, No new Rash or bruise    Data Reviewed: I have personally reviewed following labs and imaging studies  CBC: Recent Labs  Lab 12/27/18 0800 12/28/18 0500 12/29/18 0455 12/30/18 0230 12/31/18 0500  WBC 11.7* 10.8* 11.9* 10.1 9.0  NEUTROABS 10.8* 10.1* 11.1* 9.2* 7.4  HGB 14.1 12.4* 12.7* 13.2 14.7  HCT 42.1 36.6* 39.1 40.4 42.9  MCV 96.6 97.1 97.8 98.1 97.1  PLT 368 293 255 264 161   Basic Metabolic Panel: Recent Labs  Lab 12/27/18 0800 12/28/18 0500 12/29/18 0455 12/30/18 0230 12/31/18 0500  NA 139 137 135 137 138  K 4.1 4.4 4.7 4.4 4.1  CL 103 102 100 96* 99  CO2 24 26 27 27 28   GLUCOSE 265* 252* 297* 262* 145*  BUN 34* 28* 26* 29* 33*  CREATININE 0.82 0.73 0.69 0.85 0.71  CALCIUM 8.7* 8.3* 8.5* 8.6* 8.8*  MG 2.3 2.1 2.1 2.3 2.4   GFR: Estimated Creatinine Clearance: 81.5 mL/min (by C-G formula based on SCr of 0.71 mg/dL). Liver Function Tests: Recent Labs  Lab 12/26/18 0145 12/28/18 0500 12/29/18 0455 12/30/18 0230 12/31/18 0500  AST 29 41 48* 53* 36   ALT 49* 94* 131* 148* 133*  ALKPHOS 90 104 118 111 103  BILITOT 0.9 0.5 0.3 0.5 0.7  PROT 5.7* 5.2* 5.1* 5.2* 5.5*  ALBUMIN 2.5* 2.3* 2.5* 2.5* 2.7*   No results for input(s): LIPASE, AMYLASE in the last 168 hours. No results for input(s): AMMONIA in the last 168 hours. Coagulation Profile: No results for input(s): INR, PROTIME in the last 168 hours. Cardiac Enzymes: No results for input(s): CKTOTAL, CKMB, CKMBINDEX, TROPONINI in the last 168 hours. BNP (last 3 results) No results for input(s): PROBNP in the last 8760 hours. HbA1C: No results for input(s): HGBA1C in the last 72 hours. CBG: Recent Labs  Lab 12/30/18 0948 12/30/18 1206 12/30/18 1638 12/30/18 2125 12/31/18 0745  GLUCAP 158* 277* 378* 158* 302*   Lipid Profile: No results for input(s): CHOL, HDL,  LDLCALC, TRIG, CHOLHDL, LDLDIRECT in the last 72 hours. Thyroid Function Tests: No results for input(s): TSH, T4TOTAL, FREET4, T3FREE, THYROIDAB in the last 72 hours. Anemia Panel: Recent Labs    12/30/18 0230 12/31/18 0500  FERRITIN 1,343* 1,336*   Sepsis Labs: No results for input(s): PROCALCITON, LATICACIDVEN in the last 168 hours.  No results found for this or any previous visit (from the past 240 hour(s)).  Radiology Studies: No results found.   Scheduled Meds: . aspirin  81 mg Oral Daily  . atorvastatin  20 mg Oral Daily  . busPIRone  15 mg Oral BID  . Chlorhexidine Gluconate Cloth  6 each Topical Daily  . cholecalciferol  5,000 Units Oral Daily  . clonazepam  2 mg Oral BID  . cloNIDine  0.1 mg Oral TID  . enoxaparin (LOVENOX) injection  40 mg Subcutaneous Q12H  . feeding supplement (ENSURE ENLIVE)  237 mL Oral TID BM  . fluticasone  2 spray Each Nare Daily  . gabapentin  300 mg Oral 2 times per day   And  . gabapentin  600 mg Oral QHS  . insulin aspart  0-20 Units Subcutaneous TID WC  . insulin aspart  0-5 Units Subcutaneous QHS  . insulin glargine  15 Units Subcutaneous Daily  . latanoprost   1 drop Both Eyes QHS  . mouth rinse  15 mL Mouth Rinse BID  . methylPREDNISolone (SOLU-MEDROL) injection  40 mg Intravenous Daily  . multivitamin with minerals  1 tablet Oral Daily  . pantoprazole  40 mg Oral Daily  . polyethylene glycol  17 g Oral Daily  . saccharomyces boulardii  250 mg Oral BID  . tamsulosin  0.4 mg Oral QPC breakfast  . venlafaxine XR  75 mg Oral Q breakfast   Continuous Infusions: . sodium chloride 10 mL/hr at 12/31/18 0700  . dexmedetomidine (PRECEDEX) IV infusion 1.2 mcg/kg/hr (12/31/18 0700)     LOS: 13 days   Signature  Lala Lund M.D on 12/31/2018 at 10:17 AM   -  To page go to [email or web address]

## 2018-12-31 NOTE — Progress Notes (Signed)
[facility] PCCM Evening Rounds  Transitioned off of precedex Gave depakote today for mood stabilization Seems to be working Remains off precedex Calm On O2 at 10Lpm, O2 saturation stable No changes  Roselie Awkward, MD [facility] PCCM Pager: [phone] Cell: [phone] If no response, call [phone]

## 2018-12-31 NOTE — Progress Notes (Addendum)
Previously hypotensive but able to follow commands. MD Candiss Norse made aware. Lethargic and afebrile. Order noted for NS bolus 1 liter. Bolus given and blood pressure and MAP is improving.

## 2018-12-31 NOTE — Progress Notes (Signed)
Updated Rose by phone. All questions were answered and Rose thanks [place] for his care. Advised Rose that patient may potentially move to progressive floor in morning.

## 2018-12-31 NOTE — Progress Notes (Signed)
PT Cancellation Note  Patient Details Name: Joel Greene MRN: [id number] DOB: October 03, 1950   Cancelled Treatment:     Patient consented to getting OOB to recliner to eat breakfast. After spending time to prepare patient for transfer, patient refused to get up after he saw his tray. Will attempt to return for PT as schedule allows.   [name] 12/31/2018, 1:09 PM    [place] [phone]  Office [phone]

## 2018-12-31 NOTE — Progress Notes (Signed)
Initial blood glucose 70. Repeat glucose check performed and was at 66. MD Candiss Norse made aware. 1/2 Amp of D50 given. Glucose repeated again and is now at 150. Patient is no longer lethargic. Alert and oriented x 4. Able to follow commands. Precedex place on hold.

## 2019-01-01 ENCOUNTER — Inpatient Hospital Stay (HOSPITAL_COMMUNITY): Payer: Medicare Other

## 2019-01-01 DIAGNOSIS — E876 Hypokalemia: Secondary | ICD-10-CM

## 2019-01-01 LAB — POCT I-STAT 7, (LYTES, BLD GAS, ICA,H+H)
Acid-base deficit: 2 mmol/L (ref 0.0–2.0)
Bicarbonate: 18.4 mmol/L — ABNORMAL LOW (ref 20.0–28.0)
Calcium, Ion: 1.11 mmol/L — ABNORMAL LOW (ref 1.15–1.40)
HCT: 48 % (ref 39.0–52.0)
Hemoglobin: 16.3 g/dL (ref 13.0–17.0)
O2 Saturation: 82 %
Patient temperature: 100.6
Potassium: 4.3 mmol/L (ref 3.5–5.1)
Sodium: 136 mmol/L (ref 135–145)
TCO2: 19 mmol/L — ABNORMAL LOW (ref 22–32)
pCO2 arterial: 22.5 mmHg — ABNORMAL LOW (ref 32.0–48.0)
pH, Arterial: 7.525 — ABNORMAL HIGH (ref 7.350–7.450)
pO2, Arterial: 42 mmHg — ABNORMAL LOW (ref 83.0–108.0)

## 2019-01-01 LAB — CBC WITH DIFFERENTIAL/PLATELET
Abs Immature Granulocytes: 0.11 10*3/uL — ABNORMAL HIGH (ref 0.00–0.07)
Basophils Absolute: 0 10*3/uL (ref 0.0–0.1)
Basophils Relative: 0 %
Eosinophils Absolute: 0 10*3/uL (ref 0.0–0.5)
Eosinophils Relative: 0 %
HCT: 44.9 % (ref 39.0–52.0)
Hemoglobin: 14.5 g/dL (ref 13.0–17.0)
Immature Granulocytes: 1 %
Lymphocytes Relative: 8 %
Lymphs Abs: 0.7 10*3/uL (ref 0.7–4.0)
MCH: 32.2 pg (ref 26.0–34.0)
MCHC: 32.3 g/dL (ref 30.0–36.0)
MCV: 99.6 fL (ref 80.0–100.0)
Monocytes Absolute: 0.5 10*3/uL (ref 0.1–1.0)
Monocytes Relative: 6 %
Neutro Abs: 7.1 10*3/uL (ref 1.7–7.7)
Neutrophils Relative %: 85 %
Platelets: 309 10*3/uL (ref 150–400)
RBC: 4.51 MIL/uL (ref 4.22–5.81)
RDW: 14 % (ref 11.5–15.5)
WBC: 8.5 10*3/uL (ref 4.0–10.5)
nRBC: 0.2 % (ref 0.0–0.2)

## 2019-01-01 LAB — COMPREHENSIVE METABOLIC PANEL
ALT: 187 U/L — ABNORMAL HIGH (ref 0–44)
AST: 81 U/L — ABNORMAL HIGH (ref 15–41)
Albumin: 2.8 g/dL — ABNORMAL LOW (ref 3.5–5.0)
Alkaline Phosphatase: 113 U/L (ref 38–126)
Anion gap: 17 — ABNORMAL HIGH (ref 5–15)
BUN: 29 mg/dL — ABNORMAL HIGH (ref 8–23)
CO2: 22 mmol/L (ref 22–32)
Calcium: 8.6 mg/dL — ABNORMAL LOW (ref 8.9–10.3)
Chloride: 101 mmol/L (ref 98–111)
Creatinine, Ser: 0.84 mg/dL (ref 0.61–1.24)
GFR calc Af Amer: >60 mL/min (ref >60)
GFR calc non Af Amer: >60 mL/min (ref >60)
Glucose, Bld: 186 mg/dL — ABNORMAL HIGH (ref 70–99)
Potassium: 3.9 mmol/L (ref 3.5–5.1)
Sodium: 140 mmol/L (ref 135–145)
Total Bilirubin: 0.6 mg/dL (ref 0.3–1.2)
Total Protein: 5.5 g/dL — ABNORMAL LOW (ref 6.5–8.1)

## 2019-01-01 LAB — MAGNESIUM: Magnesium: 2.6 mg/dL — ABNORMAL HIGH (ref 1.7–2.4)

## 2019-01-01 LAB — C-REACTIVE PROTEIN: CRP: <0.8 mg/dL (ref <1.0)

## 2019-01-01 LAB — GLUCOSE, CAPILLARY
Glucose-Capillary: 203 mg/dL — ABNORMAL HIGH (ref 70–99)
Glucose-Capillary: 95 mg/dL (ref 70–99)
Glucose-Capillary: 179 mg/dL — ABNORMAL HIGH (ref 70–99)

## 2019-01-01 LAB — BRAIN NATRIURETIC PEPTIDE: B Natriuretic Peptide: 103.4 pg/mL — ABNORMAL HIGH (ref 0.0–100.0)

## 2019-01-01 LAB — FERRITIN: Ferritin: 1561 ng/mL — ABNORMAL HIGH (ref 24–336)

## 2019-01-01 LAB — LACTATE DEHYDROGENASE: LDH: 280 U/L — ABNORMAL HIGH (ref 98–192)

## 2019-01-01 LAB — D-DIMER, QUANTITATIVE: D-Dimer, Quant: 1.17 ug/mL-FEU — ABNORMAL HIGH (ref 0.00–0.50)

## 2019-01-01 MED ORDER — DEXMEDETOMIDINE HCL IN NACL 400 MCG/100ML IV SOLN
0.4000 ug/kg/h | INTRAVENOUS | Status: DC
  Administered 2019-01-01: 1.2 ug/kg/h via INTRAVENOUS
  Filled 2019-01-01 (×2): qty 100

## 2019-01-01 MED ORDER — CLONIDINE HCL 0.1 MG PO TABS
0.1000 mg | ORAL_TABLET | Freq: Four times a day (QID) | ORAL | Status: DC
  Administered 2019-01-01: 0.1 mg via ORAL
  Filled 2019-01-01: qty 1

## 2019-01-01 MED ORDER — LORAZEPAM 2 MG/ML IJ SOLN
1.0000 mg | INTRAMUSCULAR | Status: DC | PRN
  Administered 2019-01-01 – 2019-01-06 (×4): 1 mg via INTRAVENOUS
  Filled 2019-01-01 (×4): qty 1

## 2019-01-01 NOTE — Progress Notes (Signed)
Pt has not slept the entire shift. He's demanding that his meds need to be increased stating that he has a high tolerance to medications. He had PRN doses of ativan and ambien. Administered another PRN dose of ativan at 0622 per patient request. Patient states that IF he goes to sleep he does not want any doctors or nurses bothering him. Advised him that I would pass that along.

## 2019-01-01 NOTE — Progress Notes (Addendum)
PROGRESS NOTE                                                                                                                                                                                                             Patient Demographics:    Joel Greene, is a 67 y.o. male, DOB - 09-25-1950, CBS:[id number]  Outpatient Primary MD for the patient is Redmond School, MD    LOS - 16  Chief Complaint  Patient presents with  . Shortness of Breath       Brief Narrative: Patient is a 68 y.o. male with PMHx of CAD, depression with anxiety, chronic pain-presented with fever, shortness of breath-he unfortunately became progressively hypoxic and was intubated and transferred to [facility].  Treated with steroids, Remdesivir, Actemra and convalescent plasma.  Subsequently extubated on 6/22.  Hospital course has then been complicated by extreme anxiety.   Subjective:    Joel Greene today continues to be anxious-reassurance provided.  No major issues overnight.   Assessment  & Plan :   Acute Hypoxic Resp Failure due to Covid 19 Viral pneumonia: Slowly improving-continue high flow oxygen.  Anxiety playing a significant role in his symptoms.  Has received maximal medical therapy with Remdesivir, Actemra, convalescent plasma and steroids.  Since relatively stable over the past few days-okay to transfer to progressive care unit.  COVID-19 Labs:  Recent Labs    12/30/18 0230 12/31/18 0500 01/01/19 0200  DDIMER 1.05* 1.12* 1.17*  FERRITIN 1,343* 1,336* 1,561*  LDH 249* 228* 280*  CRP <0.8 <0.8 <0.8    Lab Results  Component Value Date   SARSCOV2NAA POSITIVE (A) 12/18/2018     COVID-19 Medications: 6/21>> Remdesivir 6/26>> Actemra 6/27>> convalescent plasma    Anxiety: Playing a big role in his symptoms-continues to exhibit severe anxiety-significant amount of reassurance provided this morning.  Continue BuSpar,  Klonopin, Effexor and Depakote.  If anxiety remains significant-we will probably need psychiatric evaluation.  Per significant other-patient was on Xanax for a number of years until this was discontinued by his PCP sometime last year-Per significant other-patient since then has had significant issues with anxiety and depression (also has some stressors with daughter)  Sepsis secondary to COVID-19: Sepsis pathophysiology has resolved-blood cultures negative.  Acute on chronic diastolic heart failure: Relatively well compensated this morning-do not think he requires diuretics  today.  HTN: Controlled-resume antihypertensives when able  BPH: Continue Flomax  OSA on CPAP  DM-2 with hyperglycemia worsened due to IV steroids (A1c 6.5): CBG stable-continue Lantus 10 units and SSI   ABG:    Component Value Date/Time   PHART 7.629 (HH) 12/24/2018 2137   PCO2ART 27.1 (L) 12/24/2018 2137   PO2ART 47.0 (L) 12/24/2018 2137   HCO3 28.5 (H) 12/24/2018 2137   TCO2 29 12/24/2018 2137   O2SAT 91.0 12/24/2018 2137    Condition -stable  Family Communication  :  Significant other updated over the phone  Code Status :  DNR  Diet :  Diet Order            Diet Carb Modified Fluid consistency: Thin; Room service appropriate? Yes  Diet effective now               Disposition Plan  :  Remain inpatient  Consults  :  PCCM  Procedures  :    ETT>>6/22 R arm PICC >>6/21  GI prophylaxis: PPI  DVT Prophylaxis  :  Lovenox   Lab Results  Component Value Date   PLT 309 01/01/2019    Inpatient Medications  Scheduled Meds: . aspirin  81 mg Oral Daily  . atorvastatin  20 mg Oral Daily  . busPIRone  15 mg Oral BID  . Chlorhexidine Gluconate Cloth  6 each Topical Daily  . cholecalciferol  5,000 Units Oral Daily  . clonazepam  1 mg Oral BID  . divalproex  250 mg Oral Q12H  . enoxaparin (LOVENOX) injection  40 mg Subcutaneous Q12H  . feeding supplement (ENSURE ENLIVE)  237 mL Oral TID BM   . fluticasone  2 spray Each Nare Daily  . gabapentin  300 mg Oral 2 times per day   And  . gabapentin  600 mg Oral QHS  . insulin aspart  0-9 Units Subcutaneous TID WC  . insulin glargine  10 Units Subcutaneous Daily  . latanoprost  1 drop Both Eyes QHS  . mouth rinse  15 mL Mouth Rinse BID  . methylPREDNISolone (SOLU-MEDROL) injection  40 mg Intravenous Daily  . multivitamin with minerals  1 tablet Oral Daily  . pantoprazole  40 mg Oral Daily  . polyethylene glycol  17 g Oral Daily  . saccharomyces boulardii  250 mg Oral BID  . tamsulosin  0.4 mg Oral QPC breakfast  . venlafaxine XR  75 mg Oral Q breakfast   Continuous Infusions: . sodium chloride 999 mL/hr at 12/31/18 1027  . dexmedetomidine (PRECEDEX) IV infusion 0.8 mcg/kg/hr (12/31/18 1500)   PRN Meds:.acetaminophen, cyclobenzaprine, hydrALAZINE, HYDROcodone-acetaminophen, loperamide, LORazepam, [DISCONTINUED] ondansetron **OR** ondansetron (ZOFRAN) IV, pneumococcal 23 valent vaccine, polyvinyl alcohol, zolpidem  Antibiotics  :    Anti-infectives (From admission, onward)   Start     Dose/Rate Route Frequency Ordered Stop   12/20/18 0800  remdesivir 100 mg in sodium chloride 0.9 % 250 mL IVPB     100 mg 500 mL/hr over 30 Minutes Intravenous Every 24 hours 12/19/18 0513 12/23/18 1845   12/19/18 1000  cefTRIAXone (ROCEPHIN) 1 g in sodium chloride 0.9 % 100 mL IVPB  Status:  Discontinued     1 g 200 mL/hr over 30 Minutes Intravenous Every 24 hours 12/19/18 0924 12/21/18 1034   12/19/18 1000  azithromycin (ZITHROMAX) 500 mg in sodium chloride 0.9 % 250 mL IVPB  Status:  Discontinued     500 mg 250 mL/hr over 60 Minutes Intravenous Every 24 hours 12/19/18  4259 12/21/18 1034   12/19/18 0545  remdesivir 200 mg in sodium chloride 0.9 % 250 mL IVPB     200 mg 500 mL/hr over 30 Minutes Intravenous Once 12/19/18 0513 12/19/18 0651   12/18/18 1330  vancomycin (VANCOCIN) IVPB 1000 mg/200 mL premix     1,000 mg 200 mL/hr over 60  Minutes Intravenous  Once 12/18/18 1327 12/18/18 1521   12/18/18 1330  piperacillin-tazobactam (ZOSYN) IVPB 3.375 g     3.375 g 100 mL/hr over 30 Minutes Intravenous  Once 12/18/18 1327 12/18/18 1521      Time Spent in minutes  25   Oren Binet M.D on 01/01/2019 at 2:59 PM  To page go to [email or web address] - use universal password  [facility] -  Office  [phone]  Admit date - 12/18/2018    14    Objective:   Vitals:   01/01/19 1100 01/01/19 1103 01/01/19 1134 01/01/19 1200  BP:  (!) 104/50 (!) 87/48 123/65  Pulse: (!) 128 (!) 129 (!) 131 (!) 133  Resp: (!) 37 (!) 34 (!) 37 (!) 40  Temp:      TempSrc:      SpO2: 97% 97% 94% (!) 88%  Weight:      Height:        Wt Readings from Last 3 Encounters:  01/01/19 74.6 kg  04/27/18 87.1 kg  07/14/17 94.6 kg     Intake/Output Summary (Last 24 hours) at 01/01/2019 1459 Last data filed at 01/01/2019 1200 Gross per 24 hour  Intake 1127.45 ml  Output 2275 ml  Net -1147.55 ml     Physical Exam Gen Exam:Alert awake-not in any distress HEENT:atraumatic, normocephalic Chest: B/L clear to auscultation anteriorly CVS:S1S2 regular Abdomen:soft non tender, non distended Extremities:no edema Neurology: Non focal Skin: no rash   Data Review:    CBC Recent Labs  Lab 12/28/18 0500 12/29/18 0455 12/30/18 0230 12/31/18 0500 01/01/19 0200  WBC 10.8* 11.9* 10.1 9.0 8.5  HGB 12.4* 12.7* 13.2 14.7 14.5  HCT 36.6* 39.1 40.4 42.9 44.9  PLT 293 255 264 293 309  MCV 97.1 97.8 98.1 97.1 99.6  MCH 32.9 31.8 32.0 33.3 32.2  MCHC 33.9 32.5 32.7 34.3 32.3  RDW 12.7 12.7 13.1 13.3 14.0  LYMPHSABS 0.3* 0.3* 0.3* 0.9 0.7  MONOABS 0.2 0.4 0.3 0.5 0.5  EOSABS 0.0 0.0 0.0 0.0 0.0  BASOSABS 0.0 0.0 0.0 0.0 0.0    Chemistries  Recent Labs  Lab 12/28/18 0500 12/29/18 0455 12/30/18 0230 12/31/18 0500 01/01/19 0200  NA 137 135 137 138 140  K 4.4 4.7 4.4 4.1 3.9  CL 102 100 96* 99 101  CO2 26 27 27 28 22   GLUCOSE 252*  297* 262* 145* 186*  BUN 28* 26* 29* 33* 29*  CREATININE 0.73 0.69 0.85 0.71 0.84  CALCIUM 8.3* 8.5* 8.6* 8.8* 8.6*  MG 2.1 2.1 2.3 2.4 2.6*  AST 41 48* 53* 36 81*  ALT 94* 131* 148* 133* 187*  ALKPHOS 104 118 111 103 113  BILITOT 0.5 0.3 0.5 0.7 0.6   ------------------------------------------------------------------------------------------------------------------ No results for input(s): CHOL, HDL, LDLCALC, TRIG, CHOLHDL, LDLDIRECT in the last 72 hours.  Lab Results  Component Value Date   HGBA1C 6.5 (H) 12/21/2018   ------------------------------------------------------------------------------------------------------------------ No results for input(s): TSH, T4TOTAL, T3FREE, THYROIDAB in the last 72 hours.  Invalid input(s): FREET3 ------------------------------------------------------------------------------------------------------------------ Recent Labs    12/31/18 0500 01/01/19 0200  FERRITIN 1,336* 1,561*    Coagulation profile No results for input(s):  INR, PROTIME in the last 168 hours.  Recent Labs    12/31/18 0500 01/01/19 0200  DDIMER 1.12* 1.17*    Cardiac Enzymes No results for input(s): CKMB, TROPONINI, MYOGLOBIN in the last 168 hours.  Invalid input(s): CK ------------------------------------------------------------------------------------------------------------------    Component Value Date/Time   BNP 103.4 (H) 01/01/2019 0200    Micro Results No results found for this or any previous visit (from the past 240 hour(s)).  Radiology Reports Dg Abd 1 View  Result Date: 12/20/2018 CLINICAL DATA:  68 year old male with abdominal distention and pain. EXAM: ABDOMEN - 1 VIEW COMPARISON:  Abdominal radiograph dated 06/11/2016 FINDINGS: There is severe gaseous distention of the stomach. This may represent gastric dysmotility or gastroparesis although a gastric outlet obstruction is not entirely excluded. CT may provide better evaluation. Air is noted  within the colon. No free air identified. There is no dilatation of the small bowel. The osseous structures and soft tissues are grossly unremarkable. IMPRESSION: Severe gaseous distention of the stomach. Electronically Signed   By: Anner Crete M.D.   On: 12/20/2018 22:23   Dg Chest Port 1 View  Result Date: 12/27/2018 CLINICAL DATA:  Shortness of breath EXAM: PORTABLE CHEST 1 VIEW COMPARISON:  December 25, 2018 FINDINGS: Central catheter tip is in the superior vena cava. No pneumothorax. There is patchy airspace consolidation in the right mid lung and both lower lung zone regions. There is cardiomegaly with pulmonary vascularity normal. No adenopathy. No bone lesions. IMPRESSION: Stable airspace opacity bilaterally, most likely due to multifocal pneumonia. Stable cardiac prominence. No change in central catheter position. No pneumothorax. Electronically Signed   By: Lowella Grip III M.D.   On: 12/27/2018 08:51   Dg Chest Port 1 View  Result Date: 12/25/2018 CLINICAL DATA:  Status postextubation.  Hypoxia. EXAM: PORTABLE CHEST 1 VIEW COMPARISON:  December 20, 2018 FINDINGS: Endotracheal tube and nasogastric tube have been removed. Central catheter tip is in the superior vena cava. No pneumothorax. There is airspace opacity in both lower lobes with consolidation medially in both lung bases, progressed compared to most recent study. More patchy infiltrate in the right upper lobe is essentially stable. Left upper lobe is now clear. There is cardiomegaly with pulmonary vascularity normal. No adenopathy. No bone lesions. IMPRESSION: Progression of airspace opacity in each lower lobe. Stable more subtle opacity right upper lobe. Left upper lobe now clear. Stable cardiomegaly. No adenopathy evident. Central catheter tip in superior vena cava.  No pneumothorax. Electronically Signed   By: Lowella Grip III M.D.   On: 12/25/2018 10:30   Dg Chest Port 1 View  Result Date: 12/20/2018 CLINICAL DATA:   Intubation. EXAM: PORTABLE CHEST 1 VIEW COMPARISON:  12/19/2018. FINDINGS: Right PICC line noted with tip over superior vena cava. Endotracheal tube, NG tube in stable position. Heart size stable. Interim improvement and bilateral pulmonary infiltrates with mild residual interstitial prominence. No pleural effusion or pneumothorax. IMPRESSION: 1. Right PICC line noted with tip over superior vena cava. Endotracheal tube and NG tube stable position. 2. Improvement of bilateral pulmonary infiltrates with mild residual interstitial prominence. Electronically Signed   By: Marcello Moores  Register   On: 12/20/2018 06:16   Dg Chest Port 1 View  Result Date: 12/19/2018 CLINICAL DATA:  Status post intubation and OG tube placement. EXAM: PORTABLE CHEST 1 VIEW COMPARISON:  Single-view of the chest 12/18/2018 at 1338 hours. FINDINGS: Endotracheal tube is in place with the tip in good position below the clavicular heads. OG tube is also seen.  The tube courses into the duodenum below the inferior margin the film. Extensive bilateral airspace disease is greater on the right and has progressed since the prior exam. Heart size is upper normal. No pneumothorax. IMPRESSION: ETT in good position. OG tube courses into the duodenum and below the inferior margin the film. Right greater than left airspace disease appears worse than on the comparison exam. Electronically Signed   By: Inge Rise M.D.   On: 12/19/2018 00:57   Dg Chest Portable 1 View  Result Date: 12/18/2018 CLINICAL DATA:  68 year old male with shortness of breath EXAM: PORTABLE CHEST 1 VIEW COMPARISON:  11/28/2015 and prior radiographs FINDINGS: Cardiomegaly again noted. Bilateral interstitial and scattered airspace opacities, RIGHT greater than LEFT, are identified. No definite pleural effusion or pneumothorax. No acute bony abnormalities are noted. IMPRESSION: Cardiomegaly with bilateral interstitial and scattered airspace opacities, RIGHT greater than LEFT -  question pneumonia versus pulmonary edema. Electronically Signed   By: Margarette Canada M.D.   On: 12/18/2018 14:33   [place] Ekg Site Rite  Result Date: 12/19/2018 If Site Rite image not attached, placement could not be confirmed due to current cardiac rhythm.

## 2019-01-01 NOTE — Progress Notes (Signed)
   01/01/19 2214  Vitals  Pulse Rate (!) 134  ECG Heart Rate (!) 134  Resp (!) 35  Oxygen Therapy  SpO2 95 %  O2 Device Non-rebreather Mask  O2 Flow Rate (L/min) 15 L/min  MEWS Score  MEWS RR 2  MEWS Pulse 3  MEWS Systolic 0  MEWS LOC 0  MEWS Temp 0  MEWS Score 5  MEWS Score Color Red    Switched to NRB mask. Pt appearing less anxious, Dr Loleta Books to come assess pt.

## 2019-01-01 NOTE — Progress Notes (Addendum)
Called to bedside for patinet tachycardic 140s, RR 40/min and SpO2 83%.    Reviewed notes, patient had been weaned to 6L today and transferred to med floor from ICU.  Anxiety noted consistently to be important driver of hypoxia.  Unfortunately, tonight, has already had Ambien, Klonopin, Ativan, as well as Buspar, Depakote, and Norco and still appears in distress, uncomfortable and stating "I'm going to die".  Patient able to slow breathing and maintain O2 sats on 15L NRB, but with myself and nursing out of the room, he removed mask and desaturated to 52% again.    Transfer to ICU will need O2 delivery with some form of PEEP  Will trial clonidine 0.1 mg q6hrs  If no improvement in anxiety, HR, RR with clonidine, will obtain CXR and restart precedex    Addendum: Clonidine no effect.  Started precedex and no improvement in HR; but BP dropped to 82C systolic.  ABG showed pH 7.5, pCO2 low, bicarb low.  CXR obtained shows worsening mostly right sided opacities.  Patient placed on HHF at 45L/min plus NRB at 100%, O2 sats still 83-88%, tachypneic to 50s, grunting.  CRNA alerted, but patient stated with RT and RN in presence that he does not wish to be intubated, understands death possible.  Would prefer comfort. Discussed with significant other, and brother. They confirmed patient's stated wishes were consistent with their understanding of his statements to them since admission.  Continue HHF and NRB.   Give Lasix 40 IV once now. Morphine for dyspnea May repeat Ativan for anxiety.    The patient is critically ill with multi-organ failure.  Critical care was necessary to treat or prevent imminent or life-threatening deterioration of respiratory failure, cardiac failure and was exclusive of separately billable procedures and treating other patients. Total critical care time spent by me: 35 minutes Time spent personally by me on obtaining history from patient or surrogate, evaluation of the patient,  evaluation of patient's response to treatment, ordering and review of laboratory studies, ordering and review of radiographic studies, ordering and performing treatments and interventions, and re-evaluation of the patient's condition.

## 2019-01-01 NOTE — Progress Notes (Signed)
Pt is laying in bed mumbling under his breath that we refuse to help him; we won't give him anything to help him sleep; all he needs is to get out of here and get some [place] and he could sleep. He gets agitated with anyone who is talking in the room.

## 2019-01-01 NOTE — Progress Notes (Signed)
   01/01/19 2239  Vitals  Pulse Rate (!) 145  ECG Heart Rate (!) 143  Resp (!) 47  Oxygen Therapy  SpO2 (!) 58 %    Alerted by CCMD pts sp02 in the 79s. Found pt to have removed oxygen and extremely anxious and disoriented. Held NRB on pts face until sp02> 90. Requested pt be upgraded for closer monitoring.

## 2019-01-01 NOTE — Progress Notes (Addendum)
NAME:  Joel Greene, MRN:  [id number], DOB:  1950/10/12, LOS: 65 ADMISSION DATE:  12/18/2018, CONSULTATION DATE:  6/20 REFERRING MD:  TRH-Singh, CHIEF COMPLAINT:  Dyspnea  Brief History   68 y/o male with a history of bronchitis presented with dyspnea and hypoxemia, initially required intubation on 6/20.  Extubated on 6/22.  Has had worsening hypoxemia.   Past Medical History  Bronchitis Inflammatory bowel syndrome Anxiety Depression Hyperlipidemia GERD  Significant Hospital Events   6/20 intubation for COVID  6/22 extubated 6/27 moved back to ICU, hypoxemic 6/28-July 3 > titrating sedation, oxygenation slowly improving from heated high flow to salter nasal cannula, remains dependent on precedex July 3> started depakote, off precedex  Consults:  PCCM  Procedures:  6/20 ETT > 6/22  6/21 R arm PICC >   Significant Diagnostic Tests:    Micro Data:  6/20 SARS-COV2> positive 6/20 blood > negative 6/22 c diff > negative   Antimicrobials:  6/20 zosyn x1 6/20 vanc x1 6/21 azithro > 6/23 6/21 ceftriaxone > 6/23 6/20 remdesivir >  6/26 actemra x1 6/26  Convalescent plasma  Interim history/subjective:   Still has periods of hypoxemia when he panics or breathes too quickly, but overall hypoxemia dramatically improved.  On 6L [place] this morning Off precedex  Objective   Blood pressure 121/80, pulse (!) 108, temperature 98.6 F (37 C), temperature source Oral, resp. rate (!) 24, height 5\' 7"  (1.702 m), weight 74.6 kg, SpO2 91 %.        Intake/Output Summary (Last 24 hours) at 01/01/2019 0742 Last data filed at 01/01/2019 1856 Gross per 24 hour  Intake 1495.36 ml  Output 2100 ml  Net -604.64 ml   Filed Weights   12/29/18 0500 12/30/18 0458 01/01/19 0400  Weight: 76.4 kg 74.8 kg 74.6 kg    Examination:  General:  Resting comfortably in bed HENT: NCAT OP clear PULM: CTA B, normal effort CV: RRR, no mgr GI: BS+, soft, nontender MSK: normal bulk and tone Neuro:  awake, alert, no distress, MAEW   Resolved Hospital Problem list     Assessment & Plan:  ARDS due to SARS-COV2: Oxygenation and oxygen needs worse in setting of panic and anxiety, improved with Precedex Continue to wean off oxygen to maintain O2 saturation greater than 85% Continue to control panic/anxiety Mobilize, out of bed Out of ICU  Anxiety/depression Severe panic episodes > improving somewhat  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 Continue depakote Stop precedex Continue clonazepam, buspar, effexor Frequent orientation, music therapy, movies Psychiatry consult  Urinary retention: Flomax   Best practice:  Diet: npo for now, may need tube feeding if intubated Pain/Anxiety/Delirium protocol (if indicated): n/a VAP protocol (if indicated): n/a DVT prophylaxis: lovenox GI prophylaxis: n/a Glucose control: per TRH Mobility: out of bed today Code Status: DNR Family Communication: per TRH Disposition: transition to floor today  Labs   CBC: Recent Labs  Lab 12/28/18 0500 12/29/18 0455 12/30/18 0230 12/31/18 0500 01/01/19 0200  WBC 10.8* 11.9* 10.1 9.0 8.5  NEUTROABS 10.1* 11.1* 9.2* 7.4 7.1  HGB 12.4* 12.7* 13.2 14.7 14.5  HCT 36.6* 39.1 40.4 42.9 44.9  MCV 97.1 97.8 98.1 97.1 99.6  PLT 293 255 264 293 314    Basic Metabolic Panel: Recent Labs  Lab 12/28/18 0500 12/29/18 0455 12/30/18 0230 12/31/18 0500 01/01/19 0200  NA 137 135 137 138 140  K 4.4 4.7 4.4 4.1 3.9  CL 102 100 96* 99 101  CO2 26 27 27 28  22  GLUCOSE 252* 297* 262* 145* 186*  BUN 28* 26* 29* 33* 29*  CREATININE 0.73 0.69 0.85 0.71 0.84  CALCIUM 8.3* 8.5* 8.6* 8.8* 8.6*  MG 2.1 2.1 2.3 2.4 2.6*   GFR: Estimated Creatinine Clearance: 77.6 mL/min (by C-G formula based on SCr of 0.84 mg/dL). Recent Labs  Lab 12/29/18 0455 12/30/18 0230 12/31/18 0500 01/01/19 0200  WBC 11.9* 10.1 9.0 8.5    Liver Function Tests: Recent Labs  Lab 12/28/18 0500 12/29/18 0455  12/30/18 0230 12/31/18 0500 01/01/19 0200  AST 41 48* 53* 36 81*  ALT 94* 131* 148* 133* 187*  ALKPHOS 104 118 111 103 113  BILITOT 0.5 0.3 0.5 0.7 0.6  PROT 5.2* 5.1* 5.2* 5.5* 5.5*  ALBUMIN 2.3* 2.5* 2.5* 2.7* 2.8*   No results for input(s): LIPASE, AMYLASE in the last 168 hours. No results for input(s): AMMONIA in the last 168 hours.  ABG    Component Value Date/Time   PHART 7.629 (HH) 12/24/2018 2137   PCO2ART 27.1 (L) 12/24/2018 2137   PO2ART 47.0 (L) 12/24/2018 2137   HCO3 28.5 (H) 12/24/2018 2137   TCO2 29 12/24/2018 2137   O2SAT 91.0 12/24/2018 2137     Coagulation Profile: No results for input(s): INR, PROTIME in the last 168 hours.  Cardiac Enzymes: No results for input(s): CKTOTAL, CKMB, CKMBINDEX, TROPONINI in the last 168 hours.  HbA1C: Hgb A1c MFr Bld  Date/Time Value Ref Range Status  12/21/2018 02:00 AM 6.5 (H) 4.8 - 5.6 % Final    Comment:    (NOTE) Pre diabetes:          5.7%-6.4% Diabetes:              >6.4% Glycemic control for   <7.0% adults with diabetes     CBG: Recent Labs  Lab 12/31/18 0745 12/31/18 1149 12/31/18 1649 12/31/18 2154 01/01/19 0727  GLUCAP 302* 150* 286* 123* 203*    Critical care time: n/a       Roselie Awkward, MD [facility] Pager: [phone] Cell: [phone] If no response, call [phone]

## 2019-01-01 NOTE — Progress Notes (Signed)
Dr Lake Bells & Dr Sloan Leiter at bedside rounding on patient.  Updated on pt status and plan of care.  To transfer out today.

## 2019-01-01 NOTE — Progress Notes (Signed)
Paged Dr Sloan Leiter to notify him that pt was refusing to wear his oxygen, sating 91% on RA and in no distress.  Pt also expresses desire to die.  Awaiting response.

## 2019-01-01 NOTE — Progress Notes (Signed)
   01/01/19 2155  Vitals  BP (!) 142/88  MAP (mmHg) 101  Pulse Rate (!) 143  ECG Heart Rate (!) 143  Resp (!) 40  Oxygen Therapy  SpO2 (!) 83 %  MEWS Score  MEWS RR 3  MEWS Pulse 3  MEWS Systolic 0  MEWS LOC 0  MEWS Temp 0  MEWS Score 6  MEWS Score Color Red    Pt with increased anxiety stating he is about to die given all anxiety meds currently ordered. Dr. Johnathan Hausen notified

## 2019-01-01 NOTE — Progress Notes (Signed)
CBG 234  

## 2019-01-01 NOTE — Progress Notes (Signed)
Pt has been very calm and cooperative. He has been very talkative and requested his PRN ativan to help him relax and/or sleep. Continuing to monitor

## 2019-01-01 NOTE — Plan of Care (Signed)
Patient received from CCU via bed. No complaints of pain currently. Patient is SOB with anxiety and movement. No needs expressed at this time. Will continue to monitor.

## 2019-01-02 LAB — D-DIMER, QUANTITATIVE: D-Dimer, Quant: 2.71 ug/mL-FEU — ABNORMAL HIGH (ref 0.00–0.50)

## 2019-01-02 LAB — GLUCOSE, CAPILLARY
Glucose-Capillary: 168 mg/dL — ABNORMAL HIGH (ref 70–99)
Glucose-Capillary: 245 mg/dL — ABNORMAL HIGH (ref 70–99)
Glucose-Capillary: 234 mg/dL — ABNORMAL HIGH (ref 70–99)
Glucose-Capillary: 250 mg/dL — ABNORMAL HIGH (ref 70–99)

## 2019-01-02 LAB — CBC
HCT: 47.7 % (ref 39.0–52.0)
Hemoglobin: 15.7 g/dL (ref 13.0–17.0)
MCH: 32.2 pg (ref 26.0–34.0)
MCHC: 32.9 g/dL (ref 30.0–36.0)
MCV: 97.9 fL (ref 80.0–100.0)
Platelets: 304 10*3/uL (ref 150–400)
RBC: 4.87 MIL/uL (ref 4.22–5.81)
RDW: 14.6 % (ref 11.5–15.5)
WBC: 10 10*3/uL (ref 4.0–10.5)
nRBC: 0.2 % (ref 0.0–0.2)

## 2019-01-02 LAB — COMPREHENSIVE METABOLIC PANEL
ALT: 147 U/L — ABNORMAL HIGH (ref 0–44)
AST: 50 U/L — ABNORMAL HIGH (ref 15–41)
Albumin: 2.7 g/dL — ABNORMAL LOW (ref 3.5–5.0)
Alkaline Phosphatase: 114 U/L (ref 38–126)
Anion gap: 19 — ABNORMAL HIGH (ref 5–15)
BUN: 23 mg/dL (ref 8–23)
CO2: 19 mmol/L — ABNORMAL LOW (ref 22–32)
Calcium: 7.9 mg/dL — ABNORMAL LOW (ref 8.9–10.3)
Chloride: 101 mmol/L (ref 98–111)
Creatinine, Ser: 1.14 mg/dL (ref 0.61–1.24)
GFR calc Af Amer: >60 mL/min (ref >60)
GFR calc non Af Amer: >60 mL/min (ref >60)
Glucose, Bld: 129 mg/dL — ABNORMAL HIGH (ref 70–99)
Potassium: 4.4 mmol/L (ref 3.5–5.1)
Sodium: 139 mmol/L (ref 135–145)
Total Bilirubin: 0.8 mg/dL (ref 0.3–1.2)
Total Protein: 5.3 g/dL — ABNORMAL LOW (ref 6.5–8.1)

## 2019-01-02 LAB — C-REACTIVE PROTEIN: CRP: 4.3 mg/dL — ABNORMAL HIGH (ref <1.0)

## 2019-01-02 LAB — FERRITIN: Ferritin: 1908 ng/mL — ABNORMAL HIGH (ref 24–336)

## 2019-01-02 LAB — PROCALCITONIN: Procalcitonin: 21.39 ng/mL

## 2019-01-02 LAB — LACTIC ACID, PLASMA: Lactic Acid, Venous: 3.4 mmol/L (ref 0.5–1.9)

## 2019-01-02 LAB — LACTATE DEHYDROGENASE: LDH: 317 U/L — ABNORMAL HIGH (ref 98–192)

## 2019-01-02 MED ORDER — BUSPIRONE HCL 5 MG PO TABS
15.0000 mg | ORAL_TABLET | Freq: Three times a day (TID) | ORAL | Status: DC
  Administered 2019-01-02 – 2019-01-13 (×33): 15 mg via ORAL
  Filled 2019-01-02 (×39): qty 1

## 2019-01-02 MED ORDER — VANCOMYCIN HCL 10 G IV SOLR
1500.0000 mg | Freq: Once | INTRAVENOUS | Status: AC
  Administered 2019-01-02: 1500 mg via INTRAVENOUS
  Filled 2019-01-02: qty 500

## 2019-01-02 MED ORDER — VANCOMYCIN HCL 10 G IV SOLR
1250.0000 mg | INTRAVENOUS | Status: DC
  Filled 2019-01-02: qty 1250

## 2019-01-02 MED ORDER — VECURONIUM BROMIDE 10 MG IV SOLR
INTRAVENOUS | Status: AC
  Filled 2019-01-02: qty 10

## 2019-01-02 MED ORDER — PROPOFOL 10 MG/ML IV BOLUS
INTRAVENOUS | Status: AC
  Filled 2019-01-02: qty 20

## 2019-01-02 MED ORDER — NOREPINEPHRINE 4 MG/250ML-% IV SOLN: 0.0000 ug/min | INTRAVENOUS | Status: DC

## 2019-01-02 MED ORDER — SODIUM CHLORIDE 0.9 % IV BOLUS
500.0000 mL | Freq: Once | INTRAVENOUS | Status: AC
  Administered 2019-01-02: 12:00:00 500 mL via INTRAVENOUS

## 2019-01-02 MED ORDER — SUCCINYLCHOLINE CHLORIDE 200 MG/10ML IV SOSY
PREFILLED_SYRINGE | INTRAVENOUS | Status: AC
  Filled 2019-01-02: qty 10

## 2019-01-02 MED ORDER — ETOMIDATE 2 MG/ML IV SOLN
INTRAVENOUS | Status: AC
  Filled 2019-01-02: qty 20

## 2019-01-02 MED ORDER — SODIUM CHLORIDE 0.9 % IV SOLN
INTRAVENOUS | Status: DC
  Administered 2019-01-02 – 2019-01-12 (×3): via INTRAVENOUS

## 2019-01-02 MED ORDER — MAGNESIUM SULFATE 2 GM/50ML IV SOLN
2.0000 g | Freq: Once | INTRAVENOUS | Status: AC
  Administered 2019-01-02: 2 g via INTRAVENOUS
  Filled 2019-01-02: qty 50

## 2019-01-02 MED ORDER — SODIUM CHLORIDE 0.9 % IV BOLUS
500.0000 mL | Freq: Once | INTRAVENOUS | Status: AC
  Administered 2019-01-02: 500 mL via INTRAVENOUS

## 2019-01-02 MED ORDER — ROCURONIUM BROMIDE 10 MG/ML (PF) SYRINGE
PREFILLED_SYRINGE | INTRAVENOUS | Status: AC
  Filled 2019-01-02: qty 10

## 2019-01-02 MED ORDER — LIP MEDEX EX OINT
TOPICAL_OINTMENT | CUTANEOUS | Status: DC | PRN
  Filled 2019-01-02: qty 7

## 2019-01-02 MED ORDER — MIDAZOLAM HCL 2 MG/2ML IJ SOLN
INTRAMUSCULAR | Status: AC
  Filled 2019-01-02: qty 4

## 2019-01-02 MED ORDER — STERILE WATER FOR INJECTION IJ SOLN
INTRAMUSCULAR | Status: AC
  Filled 2019-01-02: qty 10

## 2019-01-02 MED ORDER — METHYLPREDNISOLONE SODIUM SUCC 40 MG IJ SOLR
40.0000 mg | Freq: Two times a day (BID) | INTRAMUSCULAR | Status: DC
  Administered 2019-01-02 – 2019-01-04 (×4): 40 mg via INTRAVENOUS
  Filled 2019-01-02 (×4): qty 1

## 2019-01-02 MED ORDER — FENTANYL CITRATE (PF) 100 MCG/2ML IJ SOLN
INTRAMUSCULAR | Status: AC
  Filled 2019-01-02: qty 2

## 2019-01-02 MED ORDER — FUROSEMIDE 10 MG/ML IJ SOLN
40.0000 mg | Freq: Once | INTRAMUSCULAR | Status: AC
  Administered 2019-01-02: 01:00:00 40 mg via INTRAVENOUS
  Filled 2019-01-02: qty 4

## 2019-01-02 MED ORDER — SODIUM CHLORIDE 0.9 % IV SOLN
2.0000 g | Freq: Three times a day (TID) | INTRAVENOUS | Status: DC
  Administered 2019-01-02 – 2019-01-03 (×3): 2 g via INTRAVENOUS
  Filled 2019-01-02 (×3): qty 2

## 2019-01-02 MED ORDER — MORPHINE SULFATE (PF) 2 MG/ML IV SOLN
1.0000 mg | INTRAVENOUS | Status: DC | PRN
  Administered 2019-01-02: 1 mg via INTRAVENOUS
  Filled 2019-01-02: qty 1

## 2019-01-02 NOTE — Progress Notes (Signed)
Spoke with patients girlfriend Rose.  Updated on pt status.  Pt currently in bed on phone with brother Ricky, oxygen saturations good, seems to be comfortable.

## 2019-01-02 NOTE — Progress Notes (Signed)
Patient spoke with girlfriend Rose on Theatre [employer].

## 2019-01-02 NOTE — Progress Notes (Signed)
NAME:  Joel Greene, MRN:  [id number], DOB:  04-17-1951, LOS: 56 ADMISSION DATE:  12/18/2018, CONSULTATION DATE:  6/20 REFERRING MD:  TRH-Singh, CHIEF COMPLAINT:  Dyspnea  Brief History   68 y/o male with a history of bronchitis presented with dyspnea and hypoxemia, initially required intubation on 6/20.  Extubated on 6/22.  Has had worsening hypoxemia.   Past Medical History  Bronchitis Inflammatory bowel syndrome Anxiety Depression Hyperlipidemia GERD  Significant Hospital Events   6/20 intubation for COVID  6/22 extubated 6/27 moved back to ICU, hypoxemic 6/28-July 3 > titrating sedation, oxygenation slowly improving from heated high flow to salter nasal cannula, remains dependent on precedex  7/4-  Oxygenation improved Currently on salter high flow 10 L Breathing has improved Needs to get out of bed Remains on Precedex Had one episode of severe panic on July  Consults:  PCCM  Procedures:  6/20 ETT > 6/22  6/21 R arm PICC >   Significant Diagnostic Tests:    Micro Data:  6/20 SARS-COV2> positive 6/20 blood > negative 6/22 c diff > negative   Antimicrobials:  6/20 zosyn x1 6/20 vanc x1 6/21 azithro > 6/23 6/21 ceftriaxone > 6/23 6/20 remdesivir >  6/26 actemra x1 6/26  Convalescent plasma  Interim history/subjective:   73 - back in ICU. Now DNR. Wants to die. RN reports significant issues with negativity but has capacity. Psych consult pending . 80% fio2 - 55L HFNC. New fever 101F with hgih PCT and lactate   Objective   Blood pressure (!) 97/57, pulse (!) 131, temperature (!) 100.6 F (38.1 C), temperature source Axillary, resp. rate (!) 36, height 5\' 7"  (1.702 m), weight 74.6 kg, SpO2 97 %.    FiO2 (%):  [80 %-100 %] 80 %   Intake/Output Summary (Last 24 hours) at 01/02/2019 1109 Last data filed at 01/01/2019 2007 Gross per 24 hour  Intake 160 ml  Output 300 ml  Net -140 ml   Filed Weights   12/29/18 0500 12/30/18 0458 01/01/19 0400   Weight: 76.4 kg 74.8 kg 74.6 kg    Examination: General Appearance:  Looks comfortable. Bu Head:  Normocephalic, without obvious abnormality, atraumatic Eyes:  PERRL - yes, conjunctiva/corneas - muddy     Ears:  Normal external ear canals, both ears Nose:  G tube - no but has Lyndon Throat:  ETT TUBE - no , OG tube - no Neck:  Supple,  No enlargement/tenderness/nodules Lungs: Clear to auscultation bilaterally, RR 39 but not paradoxical Heart:  S1 and S2 normal, no murmur, CVP - no.  Pressors - no Abdomen:  Soft, no masses, no organomegaly Genitalia / Rectal:  Not done Extremities:  Extremities- intact Skin:  ntact in exposed areas . Sacral area - not examinee Neurologic:  Sedation - none -> RASS - -1 sleepy . Moves all 4s - yes. CAM-ICU - neg . Orientation - x3+     LABS    PULMONARY Recent Labs  Lab 01/01/19 2336  PHART 7.525*  PCO2ART 22.5*  PO2ART 42.0*  HCO3 18.4*  TCO2 19*  O2SAT 82.0    CBC Recent Labs  Lab 12/31/18 0500 01/01/19 0200 01/01/19 2336 01/02/19 0414  HGB 14.7 14.5 16.3 15.7  HCT 42.9 44.9 48.0 47.7  WBC 9.0 8.5  --  10.0  PLT 293 309  --  304    COAGULATION No results for input(s): INR in the last 168 hours.  CARDIAC  No results for input(s): TROPONINI in the last 168  hours. No results for input(s): PROBNP in the last 168 hours.   CHEMISTRY Recent Labs  Lab 12/28/18 0500 12/29/18 0455 12/30/18 0230 12/31/18 0500 01/01/19 0200 01/01/19 2336 01/02/19 0414  NA 137 135 137 138 140 136 139  K 4.4 4.7 4.4 4.1 3.9 4.3 4.4  CL 102 100 96* 99 101  --  101  CO2 26 27 27 28 22   --  19*  GLUCOSE 252* 297* 262* 145* 186*  --  129*  BUN 28* 26* 29* 33* 29*  --  23  CREATININE 0.73 0.69 0.85 0.71 0.84  --  1.14  CALCIUM 8.3* 8.5* 8.6* 8.8* 8.6*  --  7.9*  MG 2.1 2.1 2.3 2.4 2.6*  --   --    Estimated Creatinine Clearance: 57.2 mL/min (by C-G formula based on SCr of 1.14 mg/dL).   LIVER Recent Labs  Lab 12/29/18 0455 12/30/18 0230  12/31/18 0500 01/01/19 0200 01/02/19 0414  AST 48* 53* 36 81* 50*  ALT 131* 148* 133* 187* 147*  ALKPHOS 118 111 103 113 114  BILITOT 0.3 0.5 0.7 0.6 0.8  PROT 5.1* 5.2* 5.5* 5.5* 5.3*  ALBUMIN 2.5* 2.5* 2.7* 2.8* 2.7*     INFECTIOUS Recent Labs  Lab 01/02/19 0830  LATICACIDVEN 3.4*  PROCALCITON 21.39     ENDOCRINE CBG (last 3)  Recent Labs    01/01/19 1147 01/01/19 2222 01/02/19 0804  GLUCAP 95 179* 168*         IMAGING x48h  - image(s) personally visualized  -   highlighted in bold Dg Chest Port 1 View  Result Date: 01/02/2019 CLINICAL DATA:  Hypoxia EXAM: PORTABLE CHEST 1 VIEW COMPARISON:  12/27/2018, 12/25/2018, 12/19/2018, 12/18/2018 FINDINGS: Right upper extremity catheter tip over the cavoatrial region. Low lung volumes. Increased airspace disease in the right mid to upper lung. Bilateral pulmonary airspace opacities are otherwise unchanged. Stable enlarged cardiomediastinal silhouette. No pneumothorax. IMPRESSION: 1. Low lung volumes 2. Interval worsening of airspace disease in the right mid to upper lung since the most recent prior. Bilateral pulmonary infiltrates are otherwise unchanged. 3. Enlarged cardiomediastinal silhouette Electronically Signed   By: Donavan Foil M.D.   On: 01/02/2019 00:30   Anti-infectives (From admission, onward)   Start     Dose/Rate Route Frequency Ordered Stop   12/20/18 0800  remdesivir 100 mg in sodium chloride 0.9 % 250 mL IVPB     100 mg 500 mL/hr over 30 Minutes Intravenous Every 24 hours 12/19/18 0513 12/23/18 1845   12/19/18 1000  cefTRIAXone (ROCEPHIN) 1 g in sodium chloride 0.9 % 100 mL IVPB  Status:  Discontinued     1 g 200 mL/hr over 30 Minutes Intravenous Every 24 hours 12/19/18 0924 12/21/18 1034   12/19/18 1000  azithromycin (ZITHROMAX) 500 mg in sodium chloride 0.9 % 250 mL IVPB  Status:  Discontinued     500 mg 250 mL/hr over 60 Minutes Intravenous Every 24 hours 12/19/18 0924 12/21/18 1034   12/19/18 0545   remdesivir 200 mg in sodium chloride 0.9 % 250 mL IVPB     200 mg 500 mL/hr over 30 Minutes Intravenous Once 12/19/18 0513 12/19/18 0651   12/18/18 1330  vancomycin (VANCOCIN) IVPB 1000 mg/200 mL premix     1,000 mg 200 mL/hr over 60 Minutes Intravenous  Once 12/18/18 1327 12/18/18 1521   12/18/18 1330  piperacillin-tazobactam (ZOSYN) IVPB 3.375 g     3.375 g 100 mL/hr over 30 Minutes Intravenous  Once 12/18/18 1327 12/18/18  [facility] Problem list     Assessment & Plan:  Acute resp failure and ARDS due to SARS-COV2: Oxygenation and oxygen needs worse in setting of panic and anxiety, improved with Precedex   On 01/02/2019 - likely developng HCAP with fever, high PCT andlactate and infiltrates. He is DNR/DNI  PLAN - [facility] MD d/w Dr Sloan Leiter - will start antibiotics and fluids - Maintain HFNC - Prone  - DNR/DNI   Anxiety/depression Severe panic episodes Per triad  Urinary retention: Per triad   CCM will sign off given fact he is DNR/DNI   Best practice:  Diet: npo for now, may need tube feeding if intubated Pain/Anxiety/Delirium protocol (if indicated): n/a VAP protocol (if indicated): n/a DVT prophylaxis: lovenox GI prophylaxis: n/a Glucose control: per TR[facility] Mobility: out of bed today Code Status: DNR/DNI Family Communication: per [facility] Disposition: remain in ICU      ATTESTATION & SIGNATURE   Dr. Brand Males, M.D., [facility].C.P Pulmonary and Critical Care Medicine Staff Physician [facility] Pager: [phone], If no answer or between  15:00h - 7:00h: call [phone]  01/02/2019 11:15 AM

## 2019-01-02 NOTE — Progress Notes (Signed)
PROGRESS NOTE                                                                                                                                                                                                             Patient Demographics:    Joel Greene, is a 68 y.o. male, DOB - 04-10-1951, XFG:[id number]  Outpatient Primary MD for the patient is Redmond School, MD    LOS - 70  Chief Complaint  Patient presents with   Shortness of Breath       Brief Narrative: Patient is a 68 y.o. male with PMHx of CAD, depression with anxiety, chronic pain-presented with fever, shortness of breath-he unfortunately became progressively hypoxic and was intubated and transferred to [facility].  Treated with steroids, Remdesivir, Actemra and convalescent plasma.  Subsequently extubated on 6/22.  Hospital course has then been complicated by extreme anxiety and lately by superimposed bacterial pneumonia   Subjective:    Joel Greene was transferred back to the ICU.  Overnight he developed tachypnea, tachycardia.  He continues to exhibit anxiety issues.   Assessment  & Plan :   Acute Hypoxic Resp Failure due to Covid 19 Viral pneumonia and now with superimposed bacterial pneumonia: Continues to require high flow oxygen-he has completed a course of Remdesivir, and has been treated with Actemra and convalescent plasma.  He remains on steroids.  Overnight-has worsening tachycardia and tachypnea-chest x-ray shows worsening disease in the right midlung (personal reading)-lactate and procalcitonin are significantly elevated-suspect has superimposed bacterial pneumonia.  Starting cefepime and vancomycin.  COVID-19 Labs:  Recent Labs    12/31/18 0500 01/01/19 0200 01/02/19 0414 01/02/19 0830  DDIMER 1.12* 1.17*  --  2.71*  FERRITIN 1,336* 1,561* 1,908*  --   LDH 228* 280* 317*  --   CRP <0.8 <0.8 4.3*  --     Lab Results    Component Value Date   SARSCOV2NAA POSITIVE (A) 12/18/2018     COVID-19 Medications: 6/21>> Remdesivir 6/26>> Actemra 6/27>> convalescent plasma FiO2 (%):  [80 %-100 %] 80 %   Sepsis secondary to bacterial pneumonia: Initially had sepsis secondary to COVID-19 that resolved.  On 7/5 morning-hypertensive and tachycardic-starting empiric vancomycin and cefepime.  Have started IV fluids-if hypotension reoccurs-we will start vasopressors.  Due to sepsis-we will change Solu-Medrol to twice daily dosing.  Anxiety: Continues  to have significant amount of anxiety issues-overnight was started on Precedex infusion.  Change BuSpar to 3 times daily dosing, will continue with current dosing of Klonopin, Depakote and Effexor.  If he continues to exhibit anxiety symptoms-we will need to discuss with psychiatry.   Per significant other-patient was on Xanax for a number of years until this was discontinued by his PCP sometime last year-Per significant other-patient since then has had significant issues with anxiety and depression (also has some stressors with daughter)  Acute on chronic diastolic heart failure: Relatively well compensated this morning-do not think he requires diuretics today.  HTN: Blood pressure on the softer side-continue to hold all antihypertensives.  BPH: Continue Flomax  OSA on CPAP  DM-2 with hyperglycemia worsened due to IV steroids (A1c 6.5): CBG stable-continue Lantus 10 units and SSI   ABG:    Component Value Date/Time   PHART 7.525 (H) 01/01/2019 2336   PCO2ART 22.5 (L) 01/01/2019 2336   PO2ART 42.0 (L) 01/01/2019 2336   HCO3 18.4 (L) 01/01/2019 2336   TCO2 19 (L) 01/01/2019 2336   ACIDBASEDEF 2.0 01/01/2019 2336   O2SAT 82.0 01/01/2019 2336    Condition -stable  Family Communication  :  Significant other and brother updated over the phone  Code Status :  DNR (significant other and brother aware of patient's wishes)  Diet :  Diet Order            Diet Carb  Modified Fluid consistency: Thin; Room service appropriate? Yes  Diet effective now               Disposition Plan  :  Remain inpatient  Consults  :  PCCM  Procedures  :    ETT>>6/22 R arm PICC >>6/21  GI prophylaxis: PPI  DVT Prophylaxis  :  Lovenox   Lab Results  Component Value Date   PLT 304 01/02/2019    Inpatient Medications  Scheduled Meds:  aspirin  81 mg Oral Daily   atorvastatin  20 mg Oral Daily   busPIRone  15 mg Oral BID   Chlorhexidine Gluconate Cloth  6 each Topical Daily   cholecalciferol  5,000 Units Oral Daily   clonazepam  1 mg Oral BID   divalproex  250 mg Oral Q12H   enoxaparin (LOVENOX) injection  40 mg Subcutaneous Q12H   feeding supplement (ENSURE ENLIVE)  237 mL Oral TID BM   fluticasone  2 spray Each Nare Daily   gabapentin  300 mg Oral 2 times per day   And   gabapentin  600 mg Oral QHS   insulin aspart  0-9 Units Subcutaneous TID WC   insulin glargine  10 Units Subcutaneous Daily   latanoprost  1 drop Both Eyes QHS   mouth rinse  15 mL Mouth Rinse BID   methylPREDNISolone (SOLU-MEDROL) injection  40 mg Intravenous Daily   multivitamin with minerals  1 tablet Oral Daily   pantoprazole  40 mg Oral Daily   polyethylene glycol  17 g Oral Daily   saccharomyces boulardii  250 mg Oral BID   tamsulosin  0.4 mg Oral QPC breakfast   venlafaxine XR  75 mg Oral Q breakfast   Continuous Infusions:  sodium chloride 999 mL/hr at 12/31/18 1027   sodium chloride 125 mL/hr at 01/02/19 1400   ceFEPime (MAXIPIME) IV 200 mL/hr at 01/02/19 1221   dexmedetomidine (PRECEDEX) IV infusion Stopped (01/01/19 2353)   vancomycin 250 mL/hr at 01/02/19 1400   PRN Meds:.acetaminophen, cyclobenzaprine, hydrALAZINE,  HYDROcodone-acetaminophen, loperamide, LORazepam, [DISCONTINUED] ondansetron **OR** ondansetron (ZOFRAN) IV, pneumococcal 23 valent vaccine, polyvinyl alcohol, zolpidem  Antibiotics  :    Anti-infectives (From admission,  onward)   Start     Dose/Rate Route Frequency Ordered Stop   01/02/19 1130  vancomycin (VANCOCIN) 1,500 mg in sodium chloride 0.9 % 500 mL IVPB     1,500 mg 250 mL/hr over 120 Minutes Intravenous  Once 01/02/19 1123     01/02/19 1130  ceFEPIme (MAXIPIME) 2 g in sodium chloride 0.9 % 100 mL IVPB     2 g 200 mL/hr over 30 Minutes Intravenous Every 8 hours 01/02/19 1123     12/20/18 0800  remdesivir 100 mg in sodium chloride 0.9 % 250 mL IVPB     100 mg 500 mL/hr over 30 Minutes Intravenous Every 24 hours 12/19/18 0513 12/23/18 1845   12/19/18 1000  cefTRIAXone (ROCEPHIN) 1 g in sodium chloride 0.9 % 100 mL IVPB  Status:  Discontinued     1 g 200 mL/hr over 30 Minutes Intravenous Every 24 hours 12/19/18 0924 12/21/18 1034   12/19/18 1000  azithromycin (ZITHROMAX) 500 mg in sodium chloride 0.9 % 250 mL IVPB  Status:  Discontinued     500 mg 250 mL/hr over 60 Minutes Intravenous Every 24 hours 12/19/18 0924 12/21/18 1034   12/19/18 0545  remdesivir 200 mg in sodium chloride 0.9 % 250 mL IVPB     200 mg 500 mL/hr over 30 Minutes Intravenous Once 12/19/18 0513 12/19/18 0651   12/18/18 1330  vancomycin (VANCOCIN) IVPB 1000 mg/200 mL premix     1,000 mg 200 mL/hr over 60 Minutes Intravenous  Once 12/18/18 1327 12/18/18 1521   12/18/18 1330  piperacillin-tazobactam (ZOSYN) IVPB 3.375 g     3.375 g 100 mL/hr over 30 Minutes Intravenous  Once 12/18/18 1327 12/18/18 1521      Time Spent in minutes  25   Oren Binet M.D on 01/02/2019 at 3:04 PM  To page go to [email or web address] - use universal password  [facility] -  Office  [phone]  Admit date - 12/18/2018    15    Objective:   Vitals:   01/02/19 1100 01/02/19 1200 01/02/19 1300 01/02/19 1400  BP: (!) 87/72 (!) 81/54 (!) 82/49 (!) 121/57  Pulse: (!) 132 (!) 119 (!) 113 (!) 106  Resp: (!) 28 (!) 40 (!) 38 20  Temp:  99.1 F (37.3 C)    TempSrc:  Oral    SpO2: 97% 99% 99% 98%  Weight:      Height:        Wt Readings  from Last 3 Encounters:  01/01/19 74.6 kg  04/27/18 87.1 kg  07/14/17 94.6 kg     Intake/Output Summary (Last 24 hours) at 01/02/2019 1504 Last data filed at 01/02/2019 1400 Gross per 24 hour  Intake 1389.93 ml  Output 525 ml  Net 864.93 ml     Physical Exam General appearance:Awake, alert, not in any distress.  Eyes:no scleral icterus. HEENT: Atraumatic and Normocephalic Neck: supple, no JVD. Resp:Good air entry bilaterally,no rales or rhonchi heard anteriorly CVS: S1 S2 regular, no murmurs.  GI: Bowel sounds present, Non tender and not distended with no gaurding, rigidity or rebound. Extremities: B/L Lower Ext shows no edema, both legs are warm to touch Skin:No Rash, warm and dry Wounds:N/A   Data Review:    CBC Recent Labs  Lab 12/28/18 0500 12/29/18 0455 12/30/18 0230 12/31/18 0500 01/01/19 0200 01/01/19 2336 01/02/19  0414  WBC 10.8* 11.9* 10.1 9.0 8.5  --  10.0  HGB 12.4* 12.7* 13.2 14.7 14.5 16.3 15.7  HCT 36.6* 39.1 40.4 42.9 44.9 48.0 47.7  PLT 293 255 264 293 309  --  304  MCV 97.1 97.8 98.1 97.1 99.6  --  97.9  MCH 32.9 31.8 32.0 33.3 32.2  --  32.2  MCHC 33.9 32.5 32.7 34.3 32.3  --  32.9  RDW 12.7 12.7 13.1 13.3 14.0  --  14.6  LYMPHSABS 0.3* 0.3* 0.3* 0.9 0.7  --   --   MONOABS 0.2 0.4 0.3 0.5 0.5  --   --   EOSABS 0.0 0.0 0.0 0.0 0.0  --   --   BASOSABS 0.0 0.0 0.0 0.0 0.0  --   --     Chemistries  Recent Labs  Lab 12/28/18 0500 12/29/18 0455 12/30/18 0230 12/31/18 0500 01/01/19 0200 01/01/19 2336 01/02/19 0414  NA 137 135 137 138 140 136 139  K 4.4 4.7 4.4 4.1 3.9 4.3 4.4  CL 102 100 96* 99 101  --  101  CO2 26 27 27 28 22   --  19*  GLUCOSE 252* 297* 262* 145* 186*  --  129*  BUN 28* 26* 29* 33* 29*  --  23  CREATININE 0.73 0.69 0.85 0.71 0.84  --  1.14  CALCIUM 8.3* 8.5* 8.6* 8.8* 8.6*  --  7.9*  MG 2.1 2.1 2.3 2.4 2.6*  --   --   AST 41 48* 53* 36 81*  --  50*  ALT 94* 131* 148* 133* 187*  --  147*  ALKPHOS 104 118 111 103 113   --  114  BILITOT 0.5 0.3 0.5 0.7 0.6  --  0.8   ------------------------------------------------------------------------------------------------------------------ No results for input(s): CHOL, HDL, LDLCALC, TRIG, CHOLHDL, LDLDIRECT in the last 72 hours.  Lab Results  Component Value Date   HGBA1C 6.5 (H) 12/21/2018   ------------------------------------------------------------------------------------------------------------------ No results for input(s): TSH, T4TOTAL, T3FREE, THYROIDAB in the last 72 hours.  Invalid input(s): FREET3 ------------------------------------------------------------------------------------------------------------------ Recent Labs    01/01/19 0200 01/02/19 0414  FERRITIN 1,561* 1,908*    Coagulation profile No results for input(s): INR, PROTIME in the last 168 hours.  Recent Labs    01/01/19 0200 01/02/19 0830  DDIMER 1.17* 2.71*    Cardiac Enzymes No results for input(s): CKMB, TROPONINI, MYOGLOBIN in the last 168 hours.  Invalid input(s): CK ------------------------------------------------------------------------------------------------------------------    Component Value Date/Time   BNP 103.4 (H) 01/01/2019 0200    Micro Results No results found for this or any previous visit (from the past 240 hour(s)).  Radiology Reports Dg Abd 1 View  Result Date: 12/20/2018 CLINICAL DATA:  68 year old male with abdominal distention and pain. EXAM: ABDOMEN - 1 VIEW COMPARISON:  Abdominal radiograph dated 06/11/2016 FINDINGS: There is severe gaseous distention of the stomach. This may represent gastric dysmotility or gastroparesis although a gastric outlet obstruction is not entirely excluded. CT may provide better evaluation. Air is noted within the colon. No free air identified. There is no dilatation of the small bowel. The osseous structures and soft tissues are grossly unremarkable. IMPRESSION: Severe gaseous distention of the stomach.  Electronically Signed   By: Anner Crete M.D.   On: 12/20/2018 22:23   Dg Chest Port 1 View  Result Date: 01/02/2019 CLINICAL DATA:  Hypoxia EXAM: PORTABLE CHEST 1 VIEW COMPARISON:  12/27/2018, 12/25/2018, 12/19/2018, 12/18/2018 FINDINGS: Right upper extremity catheter tip over the cavoatrial region. Low lung  volumes. Increased airspace disease in the right mid to upper lung. Bilateral pulmonary airspace opacities are otherwise unchanged. Stable enlarged cardiomediastinal silhouette. No pneumothorax. IMPRESSION: 1. Low lung volumes 2. Interval worsening of airspace disease in the right mid to upper lung since the most recent prior. Bilateral pulmonary infiltrates are otherwise unchanged. 3. Enlarged cardiomediastinal silhouette Electronically Signed   By: Donavan Foil M.D.   On: 01/02/2019 00:30   Dg Chest Port 1 View  Result Date: 12/27/2018 CLINICAL DATA:  Shortness of breath EXAM: PORTABLE CHEST 1 VIEW COMPARISON:  December 25, 2018 FINDINGS: Central catheter tip is in the superior vena cava. No pneumothorax. There is patchy airspace consolidation in the right mid lung and both lower lung zone regions. There is cardiomegaly with pulmonary vascularity normal. No adenopathy. No bone lesions. IMPRESSION: Stable airspace opacity bilaterally, most likely due to multifocal pneumonia. Stable cardiac prominence. No change in central catheter position. No pneumothorax. Electronically Signed   By: Lowella Grip III M.D.   On: 12/27/2018 08:51   Dg Chest Port 1 View  Result Date: 12/25/2018 CLINICAL DATA:  Status postextubation.  Hypoxia. EXAM: PORTABLE CHEST 1 VIEW COMPARISON:  December 20, 2018 FINDINGS: Endotracheal tube and nasogastric tube have been removed. Central catheter tip is in the superior vena cava. No pneumothorax. There is airspace opacity in both lower lobes with consolidation medially in both lung bases, progressed compared to most recent study. More patchy infiltrate in the right upper lobe is  essentially stable. Left upper lobe is now clear. There is cardiomegaly with pulmonary vascularity normal. No adenopathy. No bone lesions. IMPRESSION: Progression of airspace opacity in each lower lobe. Stable more subtle opacity right upper lobe. Left upper lobe now clear. Stable cardiomegaly. No adenopathy evident. Central catheter tip in superior vena cava.  No pneumothorax. Electronically Signed   By: Lowella Grip III M.D.   On: 12/25/2018 10:30   Dg Chest Port 1 View  Result Date: 12/20/2018 CLINICAL DATA:  Intubation. EXAM: PORTABLE CHEST 1 VIEW COMPARISON:  12/19/2018. FINDINGS: Right PICC line noted with tip over superior vena cava. Endotracheal tube, NG tube in stable position. Heart size stable. Interim improvement and bilateral pulmonary infiltrates with mild residual interstitial prominence. No pleural effusion or pneumothorax. IMPRESSION: 1. Right PICC line noted with tip over superior vena cava. Endotracheal tube and NG tube stable position. 2. Improvement of bilateral pulmonary infiltrates with mild residual interstitial prominence. Electronically Signed   By: Marcello Moores  Register   On: 12/20/2018 06:16   Dg Chest Port 1 View  Result Date: 12/19/2018 CLINICAL DATA:  Status post intubation and OG tube placement. EXAM: PORTABLE CHEST 1 VIEW COMPARISON:  Single-view of the chest 12/18/2018 at 1338 hours. FINDINGS: Endotracheal tube is in place with the tip in good position below the clavicular heads. OG tube is also seen. The tube courses into the duodenum below the inferior margin the film. Extensive bilateral airspace disease is greater on the right and has progressed since the prior exam. Heart size is upper normal. No pneumothorax. IMPRESSION: ETT in good position. OG tube courses into the duodenum and below the inferior margin the film. Right greater than left airspace disease appears worse than on the comparison exam. Electronically Signed   By: Inge Rise M.D.   On: 12/19/2018 00:57    Dg Chest Portable 1 View  Result Date: 12/18/2018 CLINICAL DATA:  68 year old male with shortness of breath EXAM: PORTABLE CHEST 1 VIEW COMPARISON:  11/28/2015 and prior radiographs FINDINGS: Cardiomegaly again  noted. Bilateral interstitial and scattered airspace opacities, RIGHT greater than LEFT, are identified. No definite pleural effusion or pneumothorax. No acute bony abnormalities are noted. IMPRESSION: Cardiomegaly with bilateral interstitial and scattered airspace opacities, RIGHT greater than LEFT - question pneumonia versus pulmonary edema. Electronically Signed   By: Margarette Canada M.D.   On: 12/18/2018 14:33   [place] Ekg Site Rite  Result Date: 12/19/2018 If Site Rite image not attached, placement could not be confirmed due to current cardiac rhythm.

## 2019-01-02 NOTE — Progress Notes (Signed)
Pharmacy Antibiotic Note  Joel Greene is a 68 y.o. male admitted on 12/18/2018 with Covid 19 viral pneumonia and now with superimposed bacterial pneumonia.  Pharmacy has been consulted for Cefepime and Vancomycin dosing.  SCr increasing - adding IVF PCT 21, LA 3.4  Plan:  Cefepime 2g IV q8h  Vancomycin 1500 mg IV x1, then 1250 mg IV q24h.  Goal AUC = 400 - 550.  Expected AUC 448 using SCr 1.14  Follow up renal function, culture results, and clinical course.   Height: 5\' 7"  (170.2 cm) Weight: 164 lb 8 oz (74.6 kg) IBW/kg (Calculated) : 66.1  Temp (24hrs), Avg:100.3 F (37.9 C), Min:99.9 F (37.7 C), Max:100.6 F (38.1 C)  Recent Labs  Lab 12/29/18 0455 12/30/18 0230 12/31/18 0500 01/01/19 0200 01/02/19 0414 01/02/19 0830  WBC 11.9* 10.1 9.0 8.5 10.0  --   CREATININE 0.69 0.85 0.71 0.84 1.14  --   LATICACIDVEN  --   --   --   --   --  3.4*    Estimated Creatinine Clearance: 57.2 mL/min (by C-G formula based on SCr of 1.14 mg/dL).    Allergies  Allergen Reactions  . Clams [Shellfish Allergy] Nausea And Vomiting  . Demeclocycline Rash  . Tetracyclines & Related Rash    Antimicrobials this admission: 6/20 Vanc x1, Zosyn x1 6/21 Azith >>6/24 6/21 Ceftriaxone >> 6/23 6/21 Remdesivir >> 6/25 7/5 Vancomycin >>  7/5 Cefepime >>   Microbiology results: 6/20 BCx>>NGF 6/21 MRSA PCR neg 6/22 Cdiff neg  7/5 BCx: 7/5 MRSA PCR:    Thank you for allowing pharmacy to be a part of this patient's care.  Gretta Arab PharmD, BCPS Clinical pharmacist phone 7am- 5pm: [phone] 01/02/2019 11:28 AM

## 2019-01-03 LAB — MAGNESIUM: Magnesium: 2.5 mg/dL — ABNORMAL HIGH (ref 1.7–2.4)

## 2019-01-03 LAB — BLOOD CULTURE ID PANEL (REFLEXED)

## 2019-01-03 LAB — GLUCOSE, CAPILLARY
Glucose-Capillary: 66 mg/dL — ABNORMAL LOW (ref 70–99)
Glucose-Capillary: 234 mg/dL — ABNORMAL HIGH (ref 70–99)
Glucose-Capillary: 171 mg/dL — ABNORMAL HIGH (ref 70–99)
Glucose-Capillary: 219 mg/dL — ABNORMAL HIGH (ref 70–99)
Glucose-Capillary: 249 mg/dL — ABNORMAL HIGH (ref 70–99)
Glucose-Capillary: 185 mg/dL — ABNORMAL HIGH (ref 70–99)

## 2019-01-03 LAB — COMPREHENSIVE METABOLIC PANEL
ALT: 71 U/L — ABNORMAL HIGH (ref 0–44)
AST: 32 U/L (ref 15–41)
Albumin: 2 g/dL — ABNORMAL LOW (ref 3.5–5.0)
Alkaline Phosphatase: 76 U/L (ref 38–126)
Anion gap: 7 (ref 5–15)
BUN: 24 mg/dL — ABNORMAL HIGH (ref 8–23)
CO2: 22 mmol/L (ref 22–32)
Calcium: 7.4 mg/dL — ABNORMAL LOW (ref 8.9–10.3)
Chloride: 108 mmol/L (ref 98–111)
Creatinine, Ser: 0.7 mg/dL (ref 0.61–1.24)
GFR calc Af Amer: >60 mL/min (ref >60)
GFR calc non Af Amer: >60 mL/min (ref >60)
Glucose, Bld: 235 mg/dL — ABNORMAL HIGH (ref 70–99)
Potassium: 4 mmol/L (ref 3.5–5.1)
Sodium: 137 mmol/L (ref 135–145)
Total Bilirubin: 0.6 mg/dL (ref 0.3–1.2)
Total Protein: 4.4 g/dL — ABNORMAL LOW (ref 6.5–8.1)

## 2019-01-03 LAB — FERRITIN: Ferritin: 1402 ng/mL — ABNORMAL HIGH (ref 24–336)

## 2019-01-03 LAB — D-DIMER, QUANTITATIVE: D-Dimer, Quant: 1.18 ug/mL-FEU — ABNORMAL HIGH (ref 0.00–0.50)

## 2019-01-03 LAB — CBC
HCT: 35.8 % — ABNORMAL LOW (ref 39.0–52.0)
Hemoglobin: 12.1 g/dL — ABNORMAL LOW (ref 13.0–17.0)
MCH: 33.4 pg (ref 26.0–34.0)
MCHC: 33.8 g/dL (ref 30.0–36.0)
MCV: 98.9 fL (ref 80.0–100.0)
Platelets: UNDETERMINED 10*3/uL (ref 150–400)
RBC: 3.62 MIL/uL — ABNORMAL LOW (ref 4.22–5.81)
RDW: 14.1 % (ref 11.5–15.5)
WBC: 3.1 10*3/uL — ABNORMAL LOW (ref 4.0–10.5)
nRBC: 0 % (ref 0.0–0.2)

## 2019-01-03 LAB — PROCALCITONIN: Procalcitonin: 4.21 ng/mL

## 2019-01-03 LAB — C-REACTIVE PROTEIN: CRP: 12.7 mg/dL — ABNORMAL HIGH (ref <1.0)

## 2019-01-03 MED ORDER — SODIUM CHLORIDE 0.9 % IV SOLN
2.0000 g | INTRAVENOUS | Status: DC
  Administered 2019-01-03 – 2019-01-12 (×10): 2 g via INTRAVENOUS
  Filled 2019-01-03 (×3): qty 20
  Filled 2019-01-03 (×2): qty 2
  Filled 2019-01-03: qty 20
  Filled 2019-01-03: qty 2
  Filled 2019-01-03: qty 20
  Filled 2019-01-03 (×2): qty 2
  Filled 2019-01-03: qty 20
  Filled 2019-01-03: qty 2

## 2019-01-03 NOTE — Progress Notes (Signed)
PROGRESS NOTE                                                                                                                                                                                                             Patient Demographics:    Joel Greene, is a 68 y.o. male, DOB - 1951/01/10, QIH:[id number]  Outpatient Primary MD for the patient is Redmond School, MD    LOS - 73  Chief Complaint  Patient presents with   Shortness of Breath       Brief Narrative: Patient is a 68 y.o. male with PMHx of CAD, depression with anxiety, chronic pain-presented with fever, shortness of breath-he unfortunately became progressively hypoxic and was intubated and transferred to [facility].  Treated with steroids, Remdesivir, Actemra and convalescent plasma.  Subsequently extubated on 6/22.  Hospital course has then been complicated by extreme anxiety and lately by superimposed bacterial pneumonia   Subjective:    Raghav Verrilli looks better than yesterday-still requiring significant amount of oxygen via high flow.  Afebrile-blood pressure now stable.   Assessment  & Plan :   Acute Hypoxic Resp Failure due to Covid 19 Viral pneumonia and now with superimposed Streptococcus pneumoniae pneumonia: Remains clinically improved compared to yesterday-afebrile, blood pressure stable.  No longer tachypneic.  His anxiety seems to be well controlled today.  Blood cultures-preliminary-positive for Streptococcus pneumonia-stop vancomycin and cefepime-we will switch to to Rocephin.  He has already been treated with Remdesivir, Actemra and convalescent plasma for COVID-19.  He remains on steroids which will be tapered gradually over the next few days.  Nursing staff to titrate down FiO2 as much as patient can tolerate to keep O2 saturations above 88%  COVID-19 Labs:  Recent Labs    01/01/19 0200 01/02/19 0414 01/02/19 0830 01/03/19 0500    DDIMER 1.17*  --  2.71* 1.18*  FERRITIN 1,561* 1,908*  --  1,402*  LDH 280* 317*  --   --   CRP <0.8 4.3*  --  12.7*    Lab Results  Component Value Date   SARSCOV2NAA POSITIVE (A) 12/18/2018     COVID-19 Medications: 6/21>> Remdesivir 6/26>> Actemra 6/27>> convalescent plasma FiO2 (%):  [45 %-80 %] 45 %   Sepsis secondary to streptococcal pneumoniae pneumonia and bacteremia: Initially presented with sepsis secondary to COVID-19 that had resolved.  On 7/5-redeveloped sepsis pathophysiology-suspected to have superimposed bacterial pneumonia.  Blood cultures from 7/5 (prelim results) positive for Streptococcus pneumoniae.  He is clinically improved after empiric antimicrobial therapy was started-sepsis physiology has markedly improved overnight.  Stop vancomycin and cefepime-we will switch to ceftriaxone.  Anxiety: Anxiety seems to be better controlled this morning-Precedex infusion was discontinued yesterday.  He seems to be stable on BuSpar, Klonopin, Depakote and Effexor.  Per significant other-patient was on Xanax for a number of years until this was discontinued by his PCP sometime last year-Per significant other-patient since then has had significant issues with anxiety and depression (also has some stressors with daughter with whom he is estranged)  Acute on chronic diastolic heart failure: Relatively well compensated this morning-do not think he requires diuretics today.  HTN: Blood pressure controlled without the use of any antihypertensives.  BPH: Continue Flomax  OSA on CPAP  DM-2 with hyperglycemia worsened due to IV steroids (A1c 6.5): CBGs stable-continue Lantus 10 units and SSI   ABG:    Component Value Date/Time   PHART 7.525 (H) 01/01/2019 2336   PCO2ART 22.5 (L) 01/01/2019 2336   PO2ART 42.0 (L) 01/01/2019 2336   HCO3 18.4 (L) 01/01/2019 2336   TCO2 19 (L) 01/01/2019 2336   ACIDBASEDEF 2.0 01/01/2019 2336   O2SAT 82.0 01/01/2019 2336    Condition  -stable  Family Communication  :  Significant other updated over the phone on 7/6  Code Status :  DNR (significant other and brother aware of patient's wishes)  Diet :  Diet Order            Diet Carb Modified Fluid consistency: Thin; Room service appropriate? Yes  Diet effective now               Disposition Plan  :  Remain inpatient-remain in the ICU with  Consults  :  PCCM  Procedures  :    ETT>>6/22 R arm PICC >>6/21  GI prophylaxis: PPI  DVT Prophylaxis  :  Lovenox   Lab Results  Component Value Date   PLT PLATELET CLUMPS NOTED ON SMEAR, UNABLE TO ESTIMATE 01/03/2019    Inpatient Medications  Scheduled Meds:  aspirin  81 mg Oral Daily   atorvastatin  20 mg Oral Daily   busPIRone  15 mg Oral TID   Chlorhexidine Gluconate Cloth  6 each Topical Daily   cholecalciferol  5,000 Units Oral Daily   clonazepam  1 mg Oral BID   divalproex  250 mg Oral Q12H   enoxaparin (LOVENOX) injection  40 mg Subcutaneous Q12H   feeding supplement (ENSURE ENLIVE)  237 mL Oral TID BM   fluticasone  2 spray Each Nare Daily   gabapentin  300 mg Oral 2 times per day   And   gabapentin  600 mg Oral QHS   insulin aspart  0-9 Units Subcutaneous TID WC   insulin glargine  10 Units Subcutaneous Daily   latanoprost  1 drop Both Eyes QHS   mouth rinse  15 mL Mouth Rinse BID   methylPREDNISolone (SOLU-MEDROL) injection  40 mg Intravenous Q12H   multivitamin with minerals  1 tablet Oral Daily   pantoprazole  40 mg Oral Daily   polyethylene glycol  17 g Oral Daily   saccharomyces boulardii  250 mg Oral BID   tamsulosin  0.4 mg Oral QPC breakfast   venlafaxine XR  75 mg Oral Q breakfast   Continuous Infusions:  sodium chloride 999 mL/hr at 12/31/18 1027  sodium chloride 125 mL/hr at 01/03/19 0700   cefTRIAXone (ROCEPHIN)  IV     dexmedetomidine (PRECEDEX) IV infusion Stopped (01/01/19 2353)   norepinephrine (LEVOPHED) Adult infusion     PRN  Meds:.acetaminophen, cyclobenzaprine, hydrALAZINE, HYDROcodone-acetaminophen, lip balm, loperamide, LORazepam, [DISCONTINUED] ondansetron **OR** ondansetron (ZOFRAN) IV, pneumococcal 23 valent vaccine, polyvinyl alcohol, zolpidem  Antibiotics  :    Anti-infectives (From admission, onward)   Start     Dose/Rate Route Frequency Ordered Stop   01/03/19 1700  cefTRIAXone (ROCEPHIN) 2 g in sodium chloride 0.9 % 100 mL IVPB     2 g 200 mL/hr over 30 Minutes Intravenous Every 24 hours 01/03/19 0948     01/03/19 1400  vancomycin (VANCOCIN) 1,250 mg in sodium chloride 0.9 % 250 mL IVPB  Status:  Discontinued     1,250 mg 166.7 mL/hr over 90 Minutes Intravenous Every 24 hours 01/02/19 1658 01/03/19 0948   01/02/19 1130  vancomycin (VANCOCIN) 1,500 mg in sodium chloride 0.9 % 500 mL IVPB     1,500 mg 250 mL/hr over 120 Minutes Intravenous  Once 01/02/19 1123 01/02/19 1515   01/02/19 1130  ceFEPIme (MAXIPIME) 2 g in sodium chloride 0.9 % 100 mL IVPB  Status:  Discontinued     2 g 200 mL/hr over 30 Minutes Intravenous Every 8 hours 01/02/19 1123 01/03/19 0948   12/20/18 0800  remdesivir 100 mg in sodium chloride 0.9 % 250 mL IVPB     100 mg 500 mL/hr over 30 Minutes Intravenous Every 24 hours 12/19/18 0513 12/23/18 1845   12/19/18 1000  cefTRIAXone (ROCEPHIN) 1 g in sodium chloride 0.9 % 100 mL IVPB  Status:  Discontinued     1 g 200 mL/hr over 30 Minutes Intravenous Every 24 hours 12/19/18 0924 12/21/18 1034   12/19/18 1000  azithromycin (ZITHROMAX) 500 mg in sodium chloride 0.9 % 250 mL IVPB  Status:  Discontinued     500 mg 250 mL/hr over 60 Minutes Intravenous Every 24 hours 12/19/18 0924 12/21/18 1034   12/19/18 0545  remdesivir 200 mg in sodium chloride 0.9 % 250 mL IVPB     200 mg 500 mL/hr over 30 Minutes Intravenous Once 12/19/18 0513 12/19/18 0651   12/18/18 1330  vancomycin (VANCOCIN) IVPB 1000 mg/200 mL premix     1,000 mg 200 mL/hr over 60 Minutes Intravenous  Once 12/18/18 1327  12/18/18 1521   12/18/18 1330  piperacillin-tazobactam (ZOSYN) IVPB 3.375 g     3.375 g 100 mL/hr over 30 Minutes Intravenous  Once 12/18/18 1327 12/18/18 1521      Time Spent in minutes  25   Oren Binet M.D on 01/03/2019 at 10:45 AM  To page go to [email or web address] - use universal password  [facility] -  Office  [phone]  Admit date - 12/18/2018    16    Objective:   Vitals:   01/03/19 0800 01/03/19 0900 01/03/19 0915 01/03/19 1000  BP: 134/77 140/78  113/73  Pulse: (!) 106 (!) 112  (!) 110  Resp: (!) 26 (!) 43  (!) 31  Temp:      TempSrc:      SpO2: (!) 86% (!) 84% 91% 96%  Weight:      Height:        Wt Readings from Last 3 Encounters:  01/01/19 74.6 kg  04/27/18 87.1 kg  07/14/17 94.6 kg     Intake/Output Summary (Last 24 hours) at 01/03/2019 1045 Last data filed at 01/03/2019 1015 Gross per  24 hour  Intake 4655.85 ml  Output 1275 ml  Net 3380.85 ml     Physical Exam General appearance:Awake, alert, not in any distress.  Eyes:no scleral icterus. HEENT: Atraumatic and Normocephalic Neck: supple, no JVD. Resp:Good air entry bilaterally,no rales or rhonchi anteriorly CVS: S1 S2 regular, no murmurs.  GI: Bowel sounds present, Non tender and not distended with no gaurding, rigidity or rebound. Extremities: B/L Lower Ext shows no edema, both legs are warm to touch Neurology:  Non focal Musculoskeletal:No digital cyanosis Skin:No Rash, warm and dry Wounds:N/A   Data Review:    CBC Recent Labs  Lab 12/28/18 0500 12/29/18 0455 12/30/18 0230 12/31/18 0500 01/01/19 0200 01/01/19 2336 01/02/19 0414 01/03/19 0500  WBC 10.8* 11.9* 10.1 9.0 8.5  --  10.0 3.1*  HGB 12.4* 12.7* 13.2 14.7 14.5 16.3 15.7 12.1*  HCT 36.6* 39.1 40.4 42.9 44.9 48.0 47.7 35.8*  PLT 293 255 264 293 309  --  304 PLATELET CLUMPS NOTED ON SMEAR, UNABLE TO ESTIMATE  MCV 97.1 97.8 98.1 97.1 99.6  --  97.9 98.9  MCH 32.9 31.8 32.0 33.3 32.2  --  32.2 33.4  MCHC 33.9 32.5  32.7 34.3 32.3  --  32.9 33.8  RDW 12.7 12.7 13.1 13.3 14.0  --  14.6 14.1  LYMPHSABS 0.3* 0.3* 0.3* 0.9 0.7  --   --   --   MONOABS 0.2 0.4 0.3 0.5 0.5  --   --   --   EOSABS 0.0 0.0 0.0 0.0 0.0  --   --   --   BASOSABS 0.0 0.0 0.0 0.0 0.0  --   --   --     Chemistries  Recent Labs  Lab 12/29/18 0455 12/30/18 0230 12/31/18 0500 01/01/19 0200 01/01/19 2336 01/02/19 0414 01/03/19 0500  NA 135 137 138 140 136 139 137  K 4.7 4.4 4.1 3.9 4.3 4.4 4.0  CL 100 96* 99 101  --  101 108  CO2 27 27 28 22   --  19* 22  GLUCOSE 297* 262* 145* 186*  --  129* 235*  BUN 26* 29* 33* 29*  --  23 24*  CREATININE 0.69 0.85 0.71 0.84  --  1.14 0.70  CALCIUM 8.5* 8.6* 8.8* 8.6*  --  7.9* 7.4*  MG 2.1 2.3 2.4 2.6*  --   --  2.5*  AST 48* 53* 36 81*  --  50* 32  ALT 131* 148* 133* 187*  --  147* 71*  ALKPHOS 118 111 103 113  --  114 76  BILITOT 0.3 0.5 0.7 0.6  --  0.8 0.6   ------------------------------------------------------------------------------------------------------------------ No results for input(s): CHOL, HDL, LDLCALC, TRIG, CHOLHDL, LDLDIRECT in the last 72 hours.  Lab Results  Component Value Date   HGBA1C 6.5 (H) 12/21/2018   ------------------------------------------------------------------------------------------------------------------ No results for input(s): TSH, T4TOTAL, T3FREE, THYROIDAB in the last 72 hours.  Invalid input(s): FREET3 ------------------------------------------------------------------------------------------------------------------ Recent Labs    01/02/19 0414 01/03/19 0500  FERRITIN 1,908* 1,402*    Coagulation profile No results for input(s): INR, PROTIME in the last 168 hours.  Recent Labs    01/02/19 0830 01/03/19 0500  DDIMER 2.71* 1.18*    Cardiac Enzymes No results for input(s): CKMB, TROPONINI, MYOGLOBIN in the last 168 hours.  Invalid input(s):  CK ------------------------------------------------------------------------------------------------------------------    Component Value Date/Time   BNP 103.4 (H) 01/01/2019 0200    Micro Results Recent Results (from the past 240 hour(s))  Culture, blood (routine x 2)  Status: None (Preliminary result)   Collection Time: 01/02/19 11:40 AM   Specimen: BLOOD  Result Value Ref Range Status   Specimen Description   Final    BLOOD LEFT ANTECUBITAL Performed at [facility] [address], [place], [place] [address]    Special Requests   Final    BOTTLES DRAWN AEROBIC ONLY Blood Culture adequate volume Performed at [facility] [address], [place], [place] [address]    Culture  Setup Time   Final    GRAM POSITIVE COCCI AEROBIC BOTTLE ONLY CRITICAL VALUE NOTED.  VALUE IS CONSISTENT WITH PREVIOUSLY REPORTED AND CALLED VALUE. Performed at [facility], [place] [address] [place], [place] [address]    Culture PENDING  Incomplete   Report Status PENDING  Incomplete  Culture, blood (routine x 2)     Status: None (Preliminary result)   Collection Time: 01/02/19 11:45 AM   Specimen: BLOOD  Result Value Ref Range Status   Specimen Description   Final    BLOOD LEFT HAND Performed at [facility] [address], [place], [place] [address]    Special Requests   Final    BOTTLES DRAWN AEROBIC ONLY Blood Culture adequate volume Performed at [facility] [address] [place], [place] [address]    Culture  Setup Time   Final    GRAM POSITIVE COCCI AEROBIC BOTTLE ONLY Organism ID to follow CRITICAL RESULT CALLED TO, READ BACK BY AND VERIFIED WITH: Jene Every PharmD 9:40 01/03/19 ([email or web address]) Performed at [facility], [place] [address] [place],  [address]    Culture PENDING  Incomplete   Report Status PENDING  Incomplete  Blood Culture ID Panel (Reflexed)     Status: Abnormal   Collection Time:  01/02/19 11:45 AM  Result Value Ref Range Status   Enterococcus species NOT DETECTED NOT DETECTED Final   Listeria monocytogenes NOT DETECTED NOT DETECTED Final   Staphylococcus species NOT DETECTED NOT DETECTED Final   Staphylococcus aureus (BCID) NOT DETECTED NOT DETECTED Final   Streptococcus species DETECTED (A) NOT DETECTED Final    Comment: CRITICAL RESULT CALLED TO, READ BACK BY AND VERIFIED WITH: Jene Every PharmD 9:40 01/03/19 ([email or web address])    Streptococcus agalactiae NOT DETECTED NOT DETECTED Final   Streptococcus pneumoniae DETECTED (A) NOT DETECTED Final    Comment: CRITICAL RESULT CALLED TO, READ BACK BY AND VERIFIED WITH: Jene Every PharmD 9:40 01/03/19 ([email or web address])    Streptococcus pyogenes NOT DETECTED NOT DETECTED Final   Acinetobacter baumannii NOT DETECTED NOT DETECTED Final   Enterobacteriaceae species NOT DETECTED NOT DETECTED Final   Enterobacter cloacae complex NOT DETECTED NOT DETECTED Final   Escherichia coli NOT DETECTED NOT DETECTED Final   Klebsiella oxytoca NOT DETECTED NOT DETECTED Final   Klebsiella pneumoniae NOT DETECTED NOT DETECTED Final   Proteus species NOT DETECTED NOT DETECTED Final   Serratia marcescens NOT DETECTED NOT DETECTED Final   Haemophilus influenzae NOT DETECTED NOT DETECTED Final   Neisseria meningitidis NOT DETECTED NOT DETECTED Final   Pseudomonas aeruginosa NOT DETECTED NOT DETECTED Final   Candida albicans NOT DETECTED NOT DETECTED Final   Candida glabrata NOT DETECTED NOT DETECTED Final   Candida krusei NOT DETECTED NOT DETECTED Final   Candida parapsilosis NOT DETECTED NOT DETECTED Final   Candida tropicalis NOT DETECTED NOT DETECTED Final    Comment: Performed at [facility], [place] [address], [place],  [address]    Radiology  Reports Dg Abd 1 View  Result Date: 12/20/2018 CLINICAL DATA:  68 year old male with abdominal distention and pain. EXAM: ABDOMEN - 1 VIEW COMPARISON:  Abdominal radiograph dated 06/11/2016 FINDINGS:  There is severe gaseous distention of the stomach. This may represent gastric dysmotility or gastroparesis although a gastric outlet obstruction is not entirely excluded. CT may provide better evaluation. Air is noted within the colon. No free air identified. There is no dilatation of the small bowel. The osseous structures and soft tissues are grossly unremarkable. IMPRESSION: Severe gaseous distention of the stomach. Electronically Signed   By: Anner Crete M.D.   On: 12/20/2018 22:23   Dg Chest Port 1 View  Result Date: 01/02/2019 CLINICAL DATA:  Hypoxia EXAM: PORTABLE CHEST 1 VIEW COMPARISON:  12/27/2018, 12/25/2018, 12/19/2018, 12/18/2018 FINDINGS: Right upper extremity catheter tip over the cavoatrial region. Low lung volumes. Increased airspace disease in the right mid to upper lung. Bilateral pulmonary airspace opacities are otherwise unchanged. Stable enlarged cardiomediastinal silhouette. No pneumothorax. IMPRESSION: 1. Low lung volumes 2. Interval worsening of airspace disease in the right mid to upper lung since the most recent prior. Bilateral pulmonary infiltrates are otherwise unchanged. 3. Enlarged cardiomediastinal silhouette Electronically Signed   By: Donavan Foil M.D.   On: 01/02/2019 00:30   Dg Chest Port 1 View  Result Date: 12/27/2018 CLINICAL DATA:  Shortness of breath EXAM: PORTABLE CHEST 1 VIEW COMPARISON:  December 25, 2018 FINDINGS: Central catheter tip is in the superior vena cava. No pneumothorax. There is patchy airspace consolidation in the right mid lung and both lower lung zone regions. There is cardiomegaly with pulmonary vascularity normal. No adenopathy. No bone lesions. IMPRESSION: Stable airspace opacity bilaterally, most likely due to multifocal pneumonia. Stable cardiac prominence. No change in central catheter position. No pneumothorax. Electronically Signed   By: Lowella Grip III M.D.   On: 12/27/2018 08:51   Dg Chest Port 1 View  Result Date:  12/25/2018 CLINICAL DATA:  Status postextubation.  Hypoxia. EXAM: PORTABLE CHEST 1 VIEW COMPARISON:  December 20, 2018 FINDINGS: Endotracheal tube and nasogastric tube have been removed. Central catheter tip is in the superior vena cava. No pneumothorax. There is airspace opacity in both lower lobes with consolidation medially in both lung bases, progressed compared to most recent study. More patchy infiltrate in the right upper lobe is essentially stable. Left upper lobe is now clear. There is cardiomegaly with pulmonary vascularity normal. No adenopathy. No bone lesions. IMPRESSION: Progression of airspace opacity in each lower lobe. Stable more subtle opacity right upper lobe. Left upper lobe now clear. Stable cardiomegaly. No adenopathy evident. Central catheter tip in superior vena cava.  No pneumothorax. Electronically Signed   By: Lowella Grip III M.D.   On: 12/25/2018 10:30   Dg Chest Port 1 View  Result Date: 12/20/2018 CLINICAL DATA:  Intubation. EXAM: PORTABLE CHEST 1 VIEW COMPARISON:  12/19/2018. FINDINGS: Right PICC line noted with tip over superior vena cava. Endotracheal tube, NG tube in stable position. Heart size stable. Interim improvement and bilateral pulmonary infiltrates with mild residual interstitial prominence. No pleural effusion or pneumothorax. IMPRESSION: 1. Right PICC line noted with tip over superior vena cava. Endotracheal tube and NG tube stable position. 2. Improvement of bilateral pulmonary infiltrates with mild residual interstitial prominence. Electronically Signed   By: Marcello Moores  Register   On: 12/20/2018 06:16   Dg Chest Port 1 View  Result Date: 12/19/2018 CLINICAL DATA:  Status post intubation and OG tube placement. EXAM: PORTABLE CHEST 1  VIEW COMPARISON:  Single-view of the chest 12/18/2018 at 1338 hours. FINDINGS: Endotracheal tube is in place with the tip in good position below the clavicular heads. OG tube is also seen. The tube courses into the duodenum below the  inferior margin the film. Extensive bilateral airspace disease is greater on the right and has progressed since the prior exam. Heart size is upper normal. No pneumothorax. IMPRESSION: ETT in good position. OG tube courses into the duodenum and below the inferior margin the film. Right greater than left airspace disease appears worse than on the comparison exam. Electronically Signed   By: Inge Rise M.D.   On: 12/19/2018 00:57   Dg Chest Portable 1 View  Result Date: 12/18/2018 CLINICAL DATA:  68 year old male with shortness of breath EXAM: PORTABLE CHEST 1 VIEW COMPARISON:  11/28/2015 and prior radiographs FINDINGS: Cardiomegaly again noted. Bilateral interstitial and scattered airspace opacities, RIGHT greater than LEFT, are identified. No definite pleural effusion or pneumothorax. No acute bony abnormalities are noted. IMPRESSION: Cardiomegaly with bilateral interstitial and scattered airspace opacities, RIGHT greater than LEFT - question pneumonia versus pulmonary edema. Electronically Signed   By: Margarette Canada M.D.   On: 12/18/2018 14:33   [place] Ekg Site Rite  Result Date: 12/19/2018 If Site Rite image not attached, placement could not be confirmed due to current cardiac rhythm.

## 2019-01-03 NOTE — Progress Notes (Signed)
New water bottle added and charge reflects.

## 2019-01-03 NOTE — Progress Notes (Signed)
Inpatient Diabetes Program Recommendations  AACE/ADA: New Consensus Statement on Inpatient Glycemic Control (2015)  Target Ranges:  Prepandial:   less than 140 mg/dL      Peak postprandial:   less than 180 mg/dL (1-2 hours)      Critically ill patients:  140 - 180 mg/dL   Lab Results  Component Value Date   GLUCAP 249 (H) 01/03/2019   HGBA1C 6.5 (H) 12/21/2018    Review of Glycemic Control Results for Joel Greene, Joel Greene (MRN [id number]) as of 01/03/2019 17:12  Ref. Range 01/02/2019 18:33 01/02/2019 19:52 01/03/2019 08:20 01/03/2019 12:50 01/03/2019 16:48  Glucose-Capillary Latest Ref Range: 70 - 99 mg/dL 234 (H) 250 (H) 171 (H) 219 (H) 249 (H)    Inpatient Diabetes Program Recommendations:   Noted CBGs.200 since steroids restarted. Consider while on steroids: -Novolog 3 units tid meal coverage if eats 50%  Thank you, Bethena Roys E. Elihu Milstein, RN, MSN, CDE  Diabetes Coordinator Inpatient Glycemic Control Team Team Pager [phone] (8am-5pm) 01/03/2019 5:15 PM

## 2019-01-04 LAB — CBC
HCT: 34 % — ABNORMAL LOW (ref 39.0–52.0)
Hemoglobin: 11.5 g/dL — ABNORMAL LOW (ref 13.0–17.0)
MCH: 32.8 pg (ref 26.0–34.0)
MCHC: 33.8 g/dL (ref 30.0–36.0)
MCV: 96.9 fL (ref 80.0–100.0)
Platelets: 134 10*3/uL — ABNORMAL LOW (ref 150–400)
RBC: 3.51 MIL/uL — ABNORMAL LOW (ref 4.22–5.81)
RDW: 13.8 % (ref 11.5–15.5)
WBC: 4.8 10*3/uL (ref 4.0–10.5)
nRBC: 0 % (ref 0.0–0.2)

## 2019-01-04 LAB — COMPREHENSIVE METABOLIC PANEL
ALT: 79 U/L — ABNORMAL HIGH (ref 0–44)
AST: 43 U/L — ABNORMAL HIGH (ref 15–41)
Albumin: 2.1 g/dL — ABNORMAL LOW (ref 3.5–5.0)
Alkaline Phosphatase: 79 U/L (ref 38–126)
Anion gap: 9 (ref 5–15)
BUN: 23 mg/dL (ref 8–23)
CO2: 22 mmol/L (ref 22–32)
Calcium: 8.2 mg/dL — ABNORMAL LOW (ref 8.9–10.3)
Chloride: 107 mmol/L (ref 98–111)
Creatinine, Ser: 0.64 mg/dL (ref 0.61–1.24)
GFR calc Af Amer: >60 mL/min (ref >60)
GFR calc non Af Amer: >60 mL/min (ref >60)
Glucose, Bld: 217 mg/dL — ABNORMAL HIGH (ref 70–99)
Potassium: 3.9 mmol/L (ref 3.5–5.1)
Sodium: 138 mmol/L (ref 135–145)
Total Bilirubin: 0.5 mg/dL (ref 0.3–1.2)
Total Protein: 4.6 g/dL — ABNORMAL LOW (ref 6.5–8.1)

## 2019-01-04 LAB — D-DIMER, QUANTITATIVE: D-Dimer, Quant: 1 ug/mL-FEU — ABNORMAL HIGH (ref 0.00–0.50)

## 2019-01-04 LAB — GLUCOSE, CAPILLARY
Glucose-Capillary: 124 mg/dL — ABNORMAL HIGH (ref 70–99)
Glucose-Capillary: 203 mg/dL — ABNORMAL HIGH (ref 70–99)
Glucose-Capillary: 217 mg/dL — ABNORMAL HIGH (ref 70–99)
Glucose-Capillary: 241 mg/dL — ABNORMAL HIGH (ref 70–99)

## 2019-01-04 LAB — FERRITIN: Ferritin: 1638 ng/mL — ABNORMAL HIGH (ref 24–336)

## 2019-01-04 LAB — C-REACTIVE PROTEIN: CRP: 5.7 mg/dL — ABNORMAL HIGH (ref <1.0)

## 2019-01-04 MED ORDER — DIPHENHYDRAMINE HCL 25 MG PO CAPS
25.0000 mg | ORAL_CAPSULE | Freq: Four times a day (QID) | ORAL | Status: DC | PRN
  Administered 2019-01-04: 12:00:00 25 mg via ORAL
  Filled 2019-01-04: qty 1

## 2019-01-04 MED ORDER — METHYLPREDNISOLONE SODIUM SUCC 40 MG IJ SOLR
40.0000 mg | INTRAMUSCULAR | Status: DC
  Administered 2019-01-05: 40 mg via INTRAVENOUS
  Filled 2019-01-04: qty 1

## 2019-01-04 NOTE — Progress Notes (Signed)
PT Cancellation Note  Patient Details Name: Joel Greene MRN: [id number] DOB: [birth year]   Cancelled Treatment:    Reason Eval/Treat Not Completed: Medical issues which prohibited therapy, RN reports respiratory distress in AM after getting into recliner. Currently is resting well and has had a good rest of the day. Plana to transfer out of ICU today and RN recommends to postpone therapy until tomorrow.   [name] 01/04/2019, 2:57 PM  [phone]

## 2019-01-04 NOTE — Progress Notes (Signed)
PROGRESS NOTE                                                                                                                                                                                                             Patient Demographics:    Joel Greene, is a 68 y.o. male, DOB - 25-May-1951, KZS:[id number]  Outpatient Primary MD for the patient is Redmond School, MD    LOS - 53  Chief Complaint  Patient presents with   Shortness of Breath       Brief Narrative: Patient is a 68 y.o. male with PMHx of CAD, depression with anxiety, chronic pain-presented with fever, shortness of breath-he unfortunately became progressively hypoxic and was intubated and transferred to [facility].  Treated with steroids, Remdesivir, Actemra and convalescent plasma.  Subsequently extubated on 6/22.  Hospital course has then been complicated by extreme anxiety and lately by superimposed Streptococcus pneumoniae pneumonia and bacteremia.  See below for details   Subjective:    Joel Greene has improved-he was briefly titrated down to room air yesterday-oxygen requirements are markedly better.  He is much more comfortable-legs anxious than just the past few days.   Assessment  & Plan :   Acute Hypoxic Resp Failure due to Covid 19 Viral pneumonia and now with superimposed Streptococcus pneumoniae pneumonia and bacteremia: Clinically much improved-anxiety under better control.  IV Rocephin-awaiting final blood culture results.  He has already been treated with Remdesivir, Actemra and convalescent plasma for COVID-19.  He remains on steroids which will be tapered gradually over the next few days.  Continue to titrate down FiO2 as much as possible-okay to transfer to progressive care unit later today.   COVID-19 Labs:  Recent Labs    01/02/19 0414 01/02/19 0830 01/03/19 0500 01/04/19 0437 01/04/19 0442  DDIMER  --  2.71* 1.18*  --   1.00*  FERRITIN 1,908*  --  1,402* 1,638*  --   LDH 317*  --   --   --   --   CRP 4.3*  --  12.7* 5.7*  --     Lab Results  Component Value Date   SARSCOV2NAA POSITIVE (A) 12/18/2018     COVID-19 Medications: 6/21>> Remdesivir 6/26>> Actemra 6/27>> convalescent plasma FiO2 (%):  [45 %] 45 %   Sepsis secondary to streptococcal pneumoniae pneumonia and  bacteremia: Sepsis pathophysiology has resolved-treated with antibiotics and IV fluids.  Awaiting final blood culture (done on 7/5) results-preliminary results positive for Streptococcus pneumonia.  Initially presented with sepsis secondary to COVID-19 that had resolved-and on 7/5 developed sepsis pathophysiology secondary to Streptococcus pneumonia and bacteremia.  Remains on IV Rocephin.  Anxiety: Had extreme anxiety during the earlier part of this hospital stay-required Precedex infusion at one point.  His anxiety is currently stable with BuSpar, Klonopin, Depakote and Effexor.   Per significant other-patient was on Xanax for a number of years until this was discontinued by his PCP sometime last year-Per significant other-patient since then has had significant issues with anxiety and depression (also has some stressors with daughter with whom he is estranged)  Acute on chronic diastolic heart failure: Relatively well compensated this morning-do not think he requires diuretics today.  HTN: Blood pressure controlled without the use of any antihypertensives.  BPH: Continue Flomax  OSA on CPAP  DM-2 with hyperglycemia worsened due to IV steroids (A1c 6.5): CBGs stable-continue Lantus 10 units and SSI   ABG:    Component Value Date/Time   PHART 7.525 (H) 01/01/2019 2336   PCO2ART 22.5 (L) 01/01/2019 2336   PO2ART 42.0 (L) 01/01/2019 2336   HCO3 18.4 (L) 01/01/2019 2336   TCO2 19 (L) 01/01/2019 2336   ACIDBASEDEF 2.0 01/01/2019 2336   O2SAT 82.0 01/01/2019 2336    Condition -stable  Family Communication  : Left voicemail for  significant other on 7/7  Code Status :  DNR (significant other and brother aware of patient's wishes)  Diet :  Diet Order            Diet Carb Modified Fluid consistency: Thin; Room service appropriate? Yes  Diet effective now               Disposition Plan  :  Transfer to PCU  Consults  :  PCCM  Procedures  :    ETT>>6/22 R arm PICC >>6/21  GI prophylaxis: PPI  DVT Prophylaxis  :  Lovenox   Lab Results  Component Value Date   PLT 134 (L) 01/04/2019    Inpatient Medications  Scheduled Meds:  aspirin  81 mg Oral Daily   atorvastatin  20 mg Oral Daily   busPIRone  15 mg Oral TID   Chlorhexidine Gluconate Cloth  6 each Topical Daily   cholecalciferol  5,000 Units Oral Daily   clonazepam  1 mg Oral BID   divalproex  250 mg Oral Q12H   enoxaparin (LOVENOX) injection  40 mg Subcutaneous Q12H   feeding supplement (ENSURE ENLIVE)  237 mL Oral TID BM   fluticasone  2 spray Each Nare Daily   gabapentin  300 mg Oral 2 times per day   And   gabapentin  600 mg Oral QHS   insulin aspart  0-9 Units Subcutaneous TID WC   insulin glargine  10 Units Subcutaneous Daily   latanoprost  1 drop Both Eyes QHS   mouth rinse  15 mL Mouth Rinse BID   methylPREDNISolone (SOLU-MEDROL) injection  40 mg Intravenous Q12H   multivitamin with minerals  1 tablet Oral Daily   pantoprazole  40 mg Oral Daily   polyethylene glycol  17 g Oral Daily   saccharomyces boulardii  250 mg Oral BID   tamsulosin  0.4 mg Oral QPC breakfast   venlafaxine XR  75 mg Oral Q breakfast   Continuous Infusions:  sodium chloride 999 mL/hr at 12/31/18 1027  sodium chloride 10 mL/hr at 01/04/19 0400   cefTRIAXone (ROCEPHIN)  IV Stopped (01/03/19 1738)   dexmedetomidine (PRECEDEX) IV infusion Stopped (01/01/19 2353)   PRN Meds:.acetaminophen, cyclobenzaprine, diphenhydrAMINE, hydrALAZINE, HYDROcodone-acetaminophen, lip balm, loperamide, LORazepam, [DISCONTINUED] ondansetron **OR**  ondansetron (ZOFRAN) IV, pneumococcal 23 valent vaccine, polyvinyl alcohol, zolpidem  Antibiotics  :    Anti-infectives (From admission, onward)   Start     Dose/Rate Route Frequency Ordered Stop   01/03/19 1700  cefTRIAXone (ROCEPHIN) 2 g in sodium chloride 0.9 % 100 mL IVPB     2 g 200 mL/hr over 30 Minutes Intravenous Every 24 hours 01/03/19 0948     01/03/19 1400  vancomycin (VANCOCIN) 1,250 mg in sodium chloride 0.9 % 250 mL IVPB  Status:  Discontinued     1,250 mg 166.7 mL/hr over 90 Minutes Intravenous Every 24 hours 01/02/19 1658 01/03/19 0948   01/02/19 1130  vancomycin (VANCOCIN) 1,500 mg in sodium chloride 0.9 % 500 mL IVPB     1,500 mg 250 mL/hr over 120 Minutes Intravenous  Once 01/02/19 1123 01/02/19 1515   01/02/19 1130  ceFEPIme (MAXIPIME) 2 g in sodium chloride 0.9 % 100 mL IVPB  Status:  Discontinued     2 g 200 mL/hr over 30 Minutes Intravenous Every 8 hours 01/02/19 1123 01/03/19 0948   12/20/18 0800  remdesivir 100 mg in sodium chloride 0.9 % 250 mL IVPB     100 mg 500 mL/hr over 30 Minutes Intravenous Every 24 hours 12/19/18 0513 12/23/18 1845   12/19/18 1000  cefTRIAXone (ROCEPHIN) 1 g in sodium chloride 0.9 % 100 mL IVPB  Status:  Discontinued     1 g 200 mL/hr over 30 Minutes Intravenous Every 24 hours 12/19/18 0924 12/21/18 1034   12/19/18 1000  azithromycin (ZITHROMAX) 500 mg in sodium chloride 0.9 % 250 mL IVPB  Status:  Discontinued     500 mg 250 mL/hr over 60 Minutes Intravenous Every 24 hours 12/19/18 0924 12/21/18 1034   12/19/18 0545  remdesivir 200 mg in sodium chloride 0.9 % 250 mL IVPB     200 mg 500 mL/hr over 30 Minutes Intravenous Once 12/19/18 0513 12/19/18 0651   12/18/18 1330  vancomycin (VANCOCIN) IVPB 1000 mg/200 mL premix     1,000 mg 200 mL/hr over 60 Minutes Intravenous  Once 12/18/18 1327 12/18/18 1521   12/18/18 1330  piperacillin-tazobactam (ZOSYN) IVPB 3.375 g     3.375 g 100 mL/hr over 30 Minutes Intravenous  Once 12/18/18 1327  12/18/18 1521      Time Spent in minutes  25   Oren Binet M.D on 01/04/2019 at 12:07 PM  To page go to [email or web address] - use universal password  [facility] -  Office  [phone]  Admit date - 12/18/2018    17    Objective:   Vitals:   01/04/19 0500 01/04/19 0523 01/04/19 0850 01/04/19 0900  BP: (!) 156/86  136/78   Pulse: 97 98 (!) 110   Resp: (!) 31 (!) 29 (!) 31   Temp:    98.6 F (37 C)  TempSrc:    Oral  SpO2: 92% 97% 94%   Weight:      Height:        Wt Readings from Last 3 Encounters:  01/01/19 74.6 kg  04/27/18 87.1 kg  07/14/17 94.6 kg     Intake/Output Summary (Last 24 hours) at 01/04/2019 1207 Last data filed at 01/04/2019 0400 Gross per 24 hour  Intake 882.95 ml  Output 550 ml  Net 332.95 ml     Physical Exam General appearance:Awake, alert, not in any distress.  Eyes:no scleral icterus. HEENT: Atraumatic and Normocephalic Neck: supple, no JVD. Resp:Good air entry bilaterally,no rales or rhonchi CVS: S1 S2 regular, no murmurs.  GI: Bowel sounds present, Non tender and not distended with no gaurding, rigidity or rebound. Extremities: B/L Lower Ext shows no edema, both legs are warm to touch Neurology:  Non focal Psychiatric: Normal judgment and insight. Normal mood. Musculoskeletal:No digital cyanosis Skin:No Rash, warm and dry Wounds:N/A   Data Review:    CBC Recent Labs  Lab 12/29/18 0455 12/30/18 0230 12/31/18 0500 01/01/19 0200 01/01/19 2336 01/02/19 0414 01/03/19 0500 01/04/19 0442  WBC 11.9* 10.1 9.0 8.5  --  10.0 3.1* 4.8  HGB 12.7* 13.2 14.7 14.5 16.3 15.7 12.1* 11.5*  HCT 39.1 40.4 42.9 44.9 48.0 47.7 35.8* 34.0*  PLT 255 264 293 309  --  304 PLATELET CLUMPS NOTED ON SMEAR, UNABLE TO ESTIMATE 134*  MCV 97.8 98.1 97.1 99.6  --  97.9 98.9 96.9  MCH 31.8 32.0 33.3 32.2  --  32.2 33.4 32.8  MCHC 32.5 32.7 34.3 32.3  --  32.9 33.8 33.8  RDW 12.7 13.1 13.3 14.0  --  14.6 14.1 13.8  LYMPHSABS 0.3* 0.3* 0.9 0.7  --    --   --   --   MONOABS 0.4 0.3 0.5 0.5  --   --   --   --   EOSABS 0.0 0.0 0.0 0.0  --   --   --   --   BASOSABS 0.0 0.0 0.0 0.0  --   --   --   --     Chemistries  Recent Labs  Lab 12/29/18 0455 12/30/18 0230 12/31/18 0500 01/01/19 0200 01/01/19 2336 01/02/19 0414 01/03/19 0500 01/04/19 0442  NA 135 137 138 140 136 139 137 138  K 4.7 4.4 4.1 3.9 4.3 4.4 4.0 3.9  CL 100 96* 99 101  --  101 108 107  CO2 27 27 28 22   --  19* 22 22  GLUCOSE 297* 262* 145* 186*  --  129* 235* 217*  BUN 26* 29* 33* 29*  --  23 24* 23  CREATININE 0.69 0.85 0.71 0.84  --  1.14 0.70 0.64  CALCIUM 8.5* 8.6* 8.8* 8.6*  --  7.9* 7.4* 8.2*  MG 2.1 2.3 2.4 2.6*  --   --  2.5*  --   AST 48* 53* 36 81*  --  50* 32 43*  ALT 131* 148* 133* 187*  --  147* 71* 79*  ALKPHOS 118 111 103 113  --  114 76 79  BILITOT 0.3 0.5 0.7 0.6  --  0.8 0.6 0.5   ------------------------------------------------------------------------------------------------------------------ No results for input(s): CHOL, HDL, LDLCALC, TRIG, CHOLHDL, LDLDIRECT in the last 72 hours.  Lab Results  Component Value Date   HGBA1C 6.5 (H) 12/21/2018   ------------------------------------------------------------------------------------------------------------------ No results for input(s): TSH, T4TOTAL, T3FREE, THYROIDAB in the last 72 hours.  Invalid input(s): FREET3 ------------------------------------------------------------------------------------------------------------------ Recent Labs    01/03/19 0500 01/04/19 0437  FERRITIN 1,402* 1,638*    Coagulation profile No results for input(s): INR, PROTIME in the last 168 hours.  Recent Labs    01/03/19 0500 01/04/19 0442  DDIMER 1.18* 1.00*    Cardiac Enzymes No results for input(s): CKMB, TROPONINI, MYOGLOBIN in the last 168 hours.  Invalid input(s): CK ------------------------------------------------------------------------------------------------------------------      Component Value Date/Time  BNP 103.4 (H) 01/01/2019 0200    Micro Results Recent Results (from the past 240 hour(s))  Culture, blood (routine x 2)     Status: None (Preliminary result)   Collection Time: 01/02/19 11:40 AM   Specimen: BLOOD  Result Value Ref Range Status   Specimen Description   Final    BLOOD LEFT ANTECUBITAL Performed at [facility] [address] [place], [place] [address]    Special Requests   Final    BOTTLES DRAWN AEROBIC ONLY Blood Culture adequate volume Performed at [facility] [address], [place], [place] [address]    Culture  Setup Time   Final    GRAM POSITIVE COCCI AEROBIC BOTTLE ONLY CRITICAL VALUE NOTED.  VALUE IS CONSISTENT WITH PREVIOUSLY REPORTED AND CALLED VALUE. Performed at [facility], [place] [address], [place], [place] [address]    Culture GRAM POSITIVE COCCI  Final   Report Status PENDING  Incomplete  Culture, blood (routine x 2)     Status: Abnormal (Preliminary result)   Collection Time: 01/02/19 11:45 AM   Specimen: BLOOD  Result Value Ref Range Status   Specimen Description   Final    BLOOD LEFT HAND Performed at [facility] [address] [place], [place] [address]    Special Requests   Final    BOTTLES DRAWN AEROBIC ONLY Blood Culture adequate volume Performed at [facility] [address], [place], [place] [address]    Culture  Setup Time   Final    GRAM POSITIVE COCCI AEROBIC BOTTLE ONLY CRITICAL RESULT CALLED TO, READ BACK BY AND VERIFIED WITH: Jene Every PharmD 9:40 01/03/19 ([email or web address])    Culture (A)  Final    STREPTOCOCCUS PNEUMONIAE SUSCEPTIBILITIES TO FOLLOW Performed at [facility], [place] [address], [place], [place] [address]    Report Status PENDING  Incomplete  Blood Culture ID Panel (Reflexed)     Status: Abnormal   Collection Time: 01/02/19 11:45 AM  Result Value Ref Range Status   Enterococcus species NOT  DETECTED NOT DETECTED Final   Listeria monocytogenes NOT DETECTED NOT DETECTED Final   Staphylococcus species NOT DETECTED NOT DETECTED Final   Staphylococcus aureus (BCID) NOT DETECTED NOT DETECTED Final   Streptococcus species DETECTED (A) NOT DETECTED Final    Comment: CRITICAL RESULT CALLED TO, READ BACK BY AND VERIFIED WITH: Jene Every PharmD 9:40 01/03/19 ([email or web address])    Streptococcus agalactiae NOT DETECTED NOT DETECTED Final   Streptococcus pneumoniae DETECTED (A) NOT DETECTED Final    Comment: CRITICAL RESULT CALLED TO, READ BACK BY AND VERIFIED WITH: Jene Every PharmD 9:40 01/03/19 ([email or web address])    Streptococcus pyogenes NOT DETECTED NOT DETECTED Final   Acinetobacter baumannii NOT DETECTED NOT DETECTED Final   Enterobacteriaceae species NOT DETECTED NOT DETECTED Final   Enterobacter cloacae complex NOT DETECTED NOT DETECTED Final   Escherichia coli NOT DETECTED NOT DETECTED Final   Klebsiella oxytoca NOT DETECTED NOT DETECTED Final   Klebsiella pneumoniae NOT DETECTED NOT DETECTED Final   Proteus species NOT DETECTED NOT DETECTED Final   Serratia marcescens NOT DETECTED NOT DETECTED Final   Haemophilus influenzae NOT DETECTED NOT DETECTED Final   Neisseria meningitidis NOT DETECTED NOT DETECTED Final   Pseudomonas aeruginosa NOT DETECTED NOT DETECTED Final   Candida albicans NOT DETECTED NOT DETECTED Final   Candida glabrata NOT DETECTED NOT DETECTED Final   Candida krusei NOT DETECTED NOT DETECTED Final   Candida parapsilosis NOT  DETECTED NOT DETECTED Final   Candida tropicalis NOT DETECTED NOT DETECTED Final    Comment: Performed at [facility], [place] [address], [place], [place] [address]    Radiology Reports Dg Abd 1 View  Result Date: 12/20/2018 CLINICAL DATA:  68 year old male with abdominal distention and pain. EXAM: ABDOMEN - 1 VIEW COMPARISON:  Abdominal radiograph dated 06/11/2016 FINDINGS: There is severe gaseous distention of the stomach. This may represent gastric  dysmotility or gastroparesis although a gastric outlet obstruction is not entirely excluded. CT may provide better evaluation. Air is noted within the colon. No free air identified. There is no dilatation of the small bowel. The osseous structures and soft tissues are grossly unremarkable. IMPRESSION: Severe gaseous distention of the stomach. Electronically Signed   By: Anner Crete M.D.   On: 12/20/2018 22:23   Dg Chest Port 1 View  Result Date: 01/02/2019 CLINICAL DATA:  Hypoxia EXAM: PORTABLE CHEST 1 VIEW COMPARISON:  12/27/2018, 12/25/2018, 12/19/2018, 12/18/2018 FINDINGS: Right upper extremity catheter tip over the cavoatrial region. Low lung volumes. Increased airspace disease in the right mid to upper lung. Bilateral pulmonary airspace opacities are otherwise unchanged. Stable enlarged cardiomediastinal silhouette. No pneumothorax. IMPRESSION: 1. Low lung volumes 2. Interval worsening of airspace disease in the right mid to upper lung since the most recent prior. Bilateral pulmonary infiltrates are otherwise unchanged. 3. Enlarged cardiomediastinal silhouette Electronically Signed   By: Donavan Foil M.D.   On: 01/02/2019 00:30   Dg Chest Port 1 View  Result Date: 12/27/2018 CLINICAL DATA:  Shortness of breath EXAM: PORTABLE CHEST 1 VIEW COMPARISON:  December 25, 2018 FINDINGS: Central catheter tip is in the superior vena cava. No pneumothorax. There is patchy airspace consolidation in the right mid lung and both lower lung zone regions. There is cardiomegaly with pulmonary vascularity normal. No adenopathy. No bone lesions. IMPRESSION: Stable airspace opacity bilaterally, most likely due to multifocal pneumonia. Stable cardiac prominence. No change in central catheter position. No pneumothorax. Electronically Signed   By: Lowella Grip III M.D.   On: 12/27/2018 08:51   Dg Chest Port 1 View  Result Date: 12/25/2018 CLINICAL DATA:  Status postextubation.  Hypoxia. EXAM: PORTABLE CHEST 1 VIEW  COMPARISON:  December 20, 2018 FINDINGS: Endotracheal tube and nasogastric tube have been removed. Central catheter tip is in the superior vena cava. No pneumothorax. There is airspace opacity in both lower lobes with consolidation medially in both lung bases, progressed compared to most recent study. More patchy infiltrate in the right upper lobe is essentially stable. Left upper lobe is now clear. There is cardiomegaly with pulmonary vascularity normal. No adenopathy. No bone lesions. IMPRESSION: Progression of airspace opacity in each lower lobe. Stable more subtle opacity right upper lobe. Left upper lobe now clear. Stable cardiomegaly. No adenopathy evident. Central catheter tip in superior vena cava.  No pneumothorax. Electronically Signed   By: Lowella Grip III M.D.   On: 12/25/2018 10:30   Dg Chest Port 1 View  Result Date: 12/20/2018 CLINICAL DATA:  Intubation. EXAM: PORTABLE CHEST 1 VIEW COMPARISON:  12/19/2018. FINDINGS: Right PICC line noted with tip over superior vena cava. Endotracheal tube, NG tube in stable position. Heart size stable. Interim improvement and bilateral pulmonary infiltrates with mild residual interstitial prominence. No pleural effusion or pneumothorax. IMPRESSION: 1. Right PICC line noted with tip over superior vena cava. Endotracheal tube and NG tube stable position. 2. Improvement of bilateral pulmonary infiltrates with mild residual interstitial prominence. Electronically Signed  By: [place]   On: 12/20/2018 06:16   Dg Chest Port 1 View  Result Date: 12/19/2018 CLINICAL DATA:  Status post intubation and OG tube placement. EXAM: PORTABLE CHEST 1 VIEW COMPARISON:  Single-view of the chest 12/18/2018 at 1338 hours. FINDINGS: Endotracheal tube is in place with the tip in good position below the clavicular heads. OG tube is also seen. The tube courses into the duodenum below the inferior margin the film. Extensive bilateral airspace disease is greater on the right  and has progressed since the prior exam. Heart size is upper normal. No pneumothorax. IMPRESSION: ETT in good position. OG tube courses into the duodenum and below the inferior margin the film. Right greater than left airspace disease appears worse than on the comparison exam. Electronically Signed   By: Inge Rise M.D.   On: 12/19/2018 00:57   Dg Chest Portable 1 View  Result Date: 12/18/2018 CLINICAL DATA:  68 year old male with shortness of breath EXAM: PORTABLE CHEST 1 VIEW COMPARISON:  11/28/2015 and prior radiographs FINDINGS: Cardiomegaly again noted. Bilateral interstitial and scattered airspace opacities, RIGHT greater than LEFT, are identified. No definite pleural effusion or pneumothorax. No acute bony abnormalities are noted. IMPRESSION: Cardiomegaly with bilateral interstitial and scattered airspace opacities, RIGHT greater than LEFT - question pneumonia versus pulmonary edema. Electronically Signed   By: Margarette Canada M.D.   On: 12/18/2018 14:33   [place] Ekg Site Rite  Result Date: 12/19/2018 If Site Rite image not attached, placement could not be confirmed due to current cardiac rhythm.

## 2019-01-05 ENCOUNTER — Inpatient Hospital Stay (HOSPITAL_COMMUNITY): Payer: Medicare Other

## 2019-01-05 DIAGNOSIS — R6520 Severe sepsis without septic shock: Secondary | ICD-10-CM

## 2019-01-05 DIAGNOSIS — A403 Sepsis due to Streptococcus pneumoniae: Secondary | ICD-10-CM

## 2019-01-05 LAB — COMPREHENSIVE METABOLIC PANEL
ALT: 155 U/L — ABNORMAL HIGH (ref 0–44)
AST: 74 U/L — ABNORMAL HIGH (ref 15–41)
Albumin: 2.4 g/dL — ABNORMAL LOW (ref 3.5–5.0)
Alkaline Phosphatase: 99 U/L (ref 38–126)
Anion gap: 8 (ref 5–15)
BUN: 22 mg/dL (ref 8–23)
CO2: 26 mmol/L (ref 22–32)
Calcium: 8.7 mg/dL — ABNORMAL LOW (ref 8.9–10.3)
Chloride: 104 mmol/L (ref 98–111)
Creatinine, Ser: 0.62 mg/dL (ref 0.61–1.24)
GFR calc Af Amer: >60 mL/min (ref >60)
GFR calc non Af Amer: >60 mL/min (ref >60)
Glucose, Bld: 180 mg/dL — ABNORMAL HIGH (ref 70–99)
Potassium: 3.7 mmol/L (ref 3.5–5.1)
Sodium: 138 mmol/L (ref 135–145)
Total Bilirubin: 0.6 mg/dL (ref 0.3–1.2)
Total Protein: 4.8 g/dL — ABNORMAL LOW (ref 6.5–8.1)

## 2019-01-05 LAB — CULTURE, BLOOD (ROUTINE X 2)
Special Requests: ADEQUATE
Special Requests: ADEQUATE

## 2019-01-05 LAB — GLUCOSE, CAPILLARY
Glucose-Capillary: 208 mg/dL — ABNORMAL HIGH (ref 70–99)
Glucose-Capillary: 139 mg/dL — ABNORMAL HIGH (ref 70–99)
Glucose-Capillary: 254 mg/dL — ABNORMAL HIGH (ref 70–99)
Glucose-Capillary: 242 mg/dL — ABNORMAL HIGH (ref 70–99)

## 2019-01-05 LAB — CBC
HCT: 35.2 % — ABNORMAL LOW (ref 39.0–52.0)
Hemoglobin: 11.6 g/dL — ABNORMAL LOW (ref 13.0–17.0)
MCH: 32.2 pg (ref 26.0–34.0)
MCHC: 33 g/dL (ref 30.0–36.0)
MCV: 97.8 fL (ref 80.0–100.0)
Platelets: 132 10*3/uL — ABNORMAL LOW (ref 150–400)
RBC: 3.6 MIL/uL — ABNORMAL LOW (ref 4.22–5.81)
RDW: 14 % (ref 11.5–15.5)
WBC: 4.3 10*3/uL (ref 4.0–10.5)
nRBC: 0 % (ref 0.0–0.2)

## 2019-01-05 LAB — D-DIMER, QUANTITATIVE: D-Dimer, Quant: 0.93 ug/mL-FEU — ABNORMAL HIGH (ref 0.00–0.50)

## 2019-01-05 LAB — C-REACTIVE PROTEIN: CRP: 2.4 mg/dL — ABNORMAL HIGH (ref <1.0)

## 2019-01-05 LAB — FERRITIN: Ferritin: 1227 ng/mL — ABNORMAL HIGH (ref 24–336)

## 2019-01-05 MED ORDER — MAGIC MOUTHWASH W/LIDOCAINE
15.0000 mL | Freq: Four times a day (QID) | ORAL | Status: DC | PRN
  Administered 2019-01-05 – 2019-01-06 (×3): 15 mL via ORAL
  Filled 2019-01-05 (×5): qty 15

## 2019-01-05 MED ORDER — FLUCONAZOLE 100 MG PO TABS
100.0000 mg | ORAL_TABLET | Freq: Every day | ORAL | Status: AC
  Administered 2019-01-05 – 2019-01-11 (×7): 100 mg via ORAL
  Filled 2019-01-05 (×10): qty 1

## 2019-01-05 NOTE — Progress Notes (Addendum)
PROGRESS NOTE  Joel Greene  WUJ:[id number] DOB: 1951-02-04 DOA: 12/18/2018 PCP: Redmond School, MD   Brief Narrative: Patient is a 68 y.o. male with PMHx of CAD, depression with anxiety, chronic pain-presented with fever, shortness of breath-he unfortunately became progressively hypoxic and was intubated and transferred to [facility].  Treated with steroids, Remdesivir, Actemra and convalescent plasma.  Subsequently extubated on 6/22.  Hospital course has then been complicated by extreme anxiety and lately by superimposed Streptococcus pneumoniae pneumonia and bacteremia.   Assessment & Plan: Active Problems:   Acute respiratory failure with hypoxia (HCC)   Sepsis due to undetermined organism ([place])   Pneumonia due to COVID-19 virus   Acute respiratory failure with hypoxemia (HCC)   Hypokalemia   Encounter for intubation   History of ETT  Acute hypoxic respiratory failure due to covid-19 pneumonia: Received remdesivir, tocilizumab and convalescent plasma.  - Continues to require HFNC - Continue airborne, contact precautions. PPE including surgical gown, gloves, cap, shoe covers, and CAPR used during this encounter in a negative pressure room.  - Check inflammatory labs. Down trending noted. - Maintain euvolemia/net negative.  - Avoid NSAIDs - Recommend aggressive use of incentive spirometry.  Sepsis due to Pneumococcal pneumonia and bacteremia: Sepsis has resolved. WBC normal, afebrile.  - Continue IV ceftriaxone pending blood culture sensitivities. Started cefepime 7/5, transitioned to CTX 7/6. - D/w ID who recommends CT chest r/o complication e.g. cavitation. Given use of tocilizumab and bacteremia, will stop steroids (already given since admission). Reculture to document clearance. Will require 14 days IV therapy.    Oral candidiasis: - Magic mouthwash prn - Diflucan x7 days  Anxiety: Severe, required precedex followed by benzodiazepine taper - Continue xanax.  -  Continue buspar, clonazepam, depakote, and effexor.   Acute on chronic HFpEF: Improved volume status.  - Continue to monitor I/O, daily weights  HTN:  - Monitor, prn hydralazine  BPH:  - Continue flomax, monitor UOP  OSA: Unable to use CPAP currently.   T2DM with steroid-induced hyperglycemia: HbA1c 6.5%. - Continue lantus 10u + SSI  DVT prophylaxis: Lovenox Code Status: DNR Family Communication: None at bedside, stated he's been in touch with family and no need for me to call. Disposition Plan: Uncertain. Continue PT/OT regularly due to severe deconditioning.  Consultants:   PCCM  Procedures:   ETT 6/20 - 6/22  RUE PICC 6/21 6/27 - 7/7 returned to ICU due to progressive hypoxia, required precedex for severe anxiety.   Antimicrobials: 6/20 zosyn x1 6/20 vanc x1 6/21 azithro > 6/23 6/21 ceftriaxone > 6/23 6/20 remdesivir >  6/26 actemra x1 6/26  Convalescent plasma  Subjective: Out of ICU 7/7, now up to chair feels incredibly weak, unable to walk. Moderately short of breath constantly, worse with chair level exertion. Willing to work with PT. Wants to change PCP. Anxiety has improved significantly.  Objective: Vitals:   01/05/19 0411 01/05/19 0428 01/05/19 0430 01/05/19 0833  BP:    (!) 145/83  Pulse: 86  69 (!) 104  Resp: (!) 21  (!) 29 (!) 38  Temp:      TempSrc:      SpO2: 100%  100% 90%  Weight:  74.8 kg    Height:        Intake/Output Summary (Last 24 hours) at 01/05/2019 1116 Last data filed at 01/05/2019 0801 Gross per 24 hour  Intake -  Output 1950 ml  Net -1950 ml   Filed Weights   12/30/18 0458 01/01/19 0400  01/05/19 0428  Weight: 74.8 kg 74.6 kg 74.8 kg    Gen: Frail-appearing male in no distress Pulm: Non-labored breathing at rest 6LPM. Clear to auscultation bilaterally.  CV: Regular rate and rhythm. No murmur, rub, or gallop. No JVD, no significant pedal edema. GI: Abdomen soft, non-tender, non-distended, with normoactive bowel sounds.  No organomegaly or masses felt. Ext: Warm, no deformities Skin: No rashes, lesions or ulcers on visualized skin Neuro: Alert and oriented. No focal neurological deficits. Psych: Judgement and insight appear normal. Mood anxious & affect appropriate.   Data Reviewed: I have personally reviewed following labs and imaging studies  CBC: Recent Labs  Lab 12/30/18 0230 12/31/18 0500 01/01/19 0200 01/01/19 2336 01/02/19 0414 01/03/19 0500 01/04/19 0442 01/05/19 0415  WBC 10.1 9.0 8.5  --  10.0 3.1* 4.8 4.3  NEUTROABS 9.2* 7.4 7.1  --   --   --   --   --   HGB 13.2 14.7 14.5 16.3 15.7 12.1* 11.5* 11.6*  HCT 40.4 42.9 44.9 48.0 47.7 35.8* 34.0* 35.2*  MCV 98.1 97.1 99.6  --  97.9 98.9 96.9 97.8  PLT 264 293 309  --  304 PLATELET CLUMPS NOTED ON SMEAR, UNABLE TO ESTIMATE 134* 147*   Basic Metabolic Panel: Recent Labs  Lab 12/30/18 0230 12/31/18 0500 01/01/19 0200 01/01/19 2336 01/02/19 0414 01/03/19 0500 01/04/19 0442 01/05/19 0415  NA 137 138 140 136 139 137 138 138  K 4.4 4.1 3.9 4.3 4.4 4.0 3.9 3.7  CL 96* 99 101  --  101 108 107 104  CO2 27 28 22   --  19* 22 22 26   GLUCOSE 262* 145* 186*  --  129* 235* 217* 180*  BUN 29* 33* 29*  --  23 24* 23 22  CREATININE 0.85 0.71 0.84  --  1.14 0.70 0.64 0.62  CALCIUM 8.6* 8.8* 8.6*  --  7.9* 7.4* 8.2* 8.7*  MG 2.3 2.4 2.6*  --   --  2.5*  --   --    GFR: Estimated Creatinine Clearance: 81.5 mL/min (by C-G formula based on SCr of 0.62 mg/dL). Liver Function Tests: Recent Labs  Lab 01/01/19 0200 01/02/19 0414 01/03/19 0500 01/04/19 0442 01/05/19 0415  AST 81* 50* 32 43* 74*  ALT 187* 147* 71* 79* 155*  ALKPHOS 113 114 76 79 99  BILITOT 0.6 0.8 0.6 0.5 0.6  PROT 5.5* 5.3* 4.4* 4.6* 4.8*  ALBUMIN 2.8* 2.7* 2.0* 2.1* 2.4*   No results for input(s): LIPASE, AMYLASE in the last 168 hours. No results for input(s): AMMONIA in the last 168 hours. Coagulation Profile: No results for input(s): INR, PROTIME in the last 168 hours.  Cardiac Enzymes: No results for input(s): CKTOTAL, CKMB, CKMBINDEX, TROPONINI in the last 168 hours. BNP (last 3 results) No results for input(s): PROBNP in the last 8760 hours. HbA1C: No results for input(s): HGBA1C in the last 72 hours. CBG: Recent Labs  Lab 01/04/19 0802 01/04/19 1208 01/04/19 1711 01/04/19 2259 01/05/19 0827  GLUCAP 124* 203* 217* 241* 208*   Lipid Profile: No results for input(s): CHOL, HDL, LDLCALC, TRIG, CHOLHDL, LDLDIRECT in the last 72 hours. Thyroid Function Tests: No results for input(s): TSH, T4TOTAL, FREET4, T3FREE, THYROIDAB in the last 72 hours. Anemia Panel: Recent Labs    01/04/19 0437 01/05/19 0415  FERRITIN 1,638* 1,227*   Urine analysis:    Component Value Date/Time   COLORURINE YELLOW 12/18/2018 1527   APPEARANCEUR HAZY (A) 12/18/2018 1527   LABSPEC 1.019 12/18/2018  [place] 6.0 12/18/2018 1527   GLUCOSEU NEGATIVE 12/18/2018 1527   HGBUR NEGATIVE 12/18/2018 1527   [place] 12/18/2018 1527   KETONESUR NEGATIVE 12/18/2018 1527   PROTEINUR 30 (A) 12/18/2018 1527   UROBILINOGEN 0.2 02/06/2015 1353   NITRITE NEGATIVE 12/18/2018 1527   LEUKOCYTESUR NEGATIVE 12/18/2018 1527   Recent Results (from the past 240 hour(s))  Culture, blood (routine x 2)     Status: None (Preliminary result)   Collection Time: 01/02/19 11:40 AM   Specimen: BLOOD  Result Value Ref Range Status   Specimen Description   Final    BLOOD LEFT ANTECUBITAL Performed at [facility], [place] [address], [place], [place] [address]    Special Requests   Final    BOTTLES DRAWN AEROBIC ONLY Blood Culture adequate volume Performed at [place] [address], [place], [place] [address]    Culture  Setup Time   Final    GRAM POSITIVE COCCI AEROBIC BOTTLE ONLY CRITICAL VALUE NOTED.  VALUE IS CONSISTENT WITH PREVIOUSLY REPORTED AND CALLED VALUE.    Culture   Final    GRAM POSITIVE COCCI CULTURE REINCUBATED  FOR BETTER GROWTH Performed at [facility], [place] [address] [place], [place] [address]    Report Status PENDING  Incomplete  Culture, blood (routine x 2)     Status: Abnormal   Collection Time: 01/02/19 11:45 AM   Specimen: BLOOD  Result Value Ref Range Status   Specimen Description   Final    BLOOD LEFT HAND Performed at [place] [address], [place], [place] [address]    Special Requests   Final    BOTTLES DRAWN AEROBIC ONLY Blood Culture adequate volume Performed at [place] [address] [place], [place] [address]    Culture  Setup Time   Final    GRAM POSITIVE COCCI AEROBIC BOTTLE ONLY CRITICAL RESULT CALLED TO, READ BACK BY AND VERIFIED WITH: Jene Every PharmD 9:40 01/03/19 ([email or web address]) Performed at [facility], [place] [address], [place], [place] [address]    Culture STREPTOCOCCUS PNEUMONIAE (A)  Final   Report Status 01/05/2019 FINAL  Final   Organism ID, Bacteria STREPTOCOCCUS PNEUMONIAE  Final      Susceptibility   Streptococcus pneumoniae - MIC*    ERYTHROMYCIN >=8 RESISTANT Resistant     LEVOFLOXACIN 1 SENSITIVE Sensitive     VANCOMYCIN 0.5 SENSITIVE Sensitive     PENICILLIN (non-meningitis) <=0.06 SENSITIVE Sensitive     CEFTRIAXONE (non-meningitis) <=0.12 SENSITIVE Sensitive     * STREPTOCOCCUS PNEUMONIAE  Blood Culture ID Panel (Reflexed)     Status: Abnormal   Collection Time: 01/02/19 11:45 AM  Result Value Ref Range Status   Enterococcus species NOT DETECTED NOT DETECTED Final   Listeria monocytogenes NOT DETECTED NOT DETECTED Final   Staphylococcus species NOT DETECTED NOT DETECTED Final   Staphylococcus aureus (BCID) NOT DETECTED NOT DETECTED Final   Streptococcus species DETECTED (A) NOT DETECTED Final    Comment: CRITICAL RESULT CALLED TO, READ BACK BY AND VERIFIED WITH: Jene Every PharmD 9:40 01/03/19 ([email or web address])    Streptococcus agalactiae NOT DETECTED NOT DETECTED Final   Streptococcus pneumoniae  DETECTED (A) NOT DETECTED Final    Comment: CRITICAL RESULT CALLED TO, READ BACK BY AND VERIFIED WITH: Jene Every PharmD 9:40 01/03/19 ([email or web address])    Streptococcus pyogenes NOT DETECTED NOT DETECTED Final   Acinetobacter baumannii NOT DETECTED NOT DETECTED Final   Enterobacteriaceae  species NOT DETECTED NOT DETECTED Final   Enterobacter cloacae complex NOT DETECTED NOT DETECTED Final   Escherichia coli NOT DETECTED NOT DETECTED Final   Klebsiella oxytoca NOT DETECTED NOT DETECTED Final   Klebsiella pneumoniae NOT DETECTED NOT DETECTED Final   Proteus species NOT DETECTED NOT DETECTED Final   Serratia marcescens NOT DETECTED NOT DETECTED Final   Haemophilus influenzae NOT DETECTED NOT DETECTED Final   Neisseria meningitidis NOT DETECTED NOT DETECTED Final   Pseudomonas aeruginosa NOT DETECTED NOT DETECTED Final   Candida albicans NOT DETECTED NOT DETECTED Final   Candida glabrata NOT DETECTED NOT DETECTED Final   Candida krusei NOT DETECTED NOT DETECTED Final   Candida parapsilosis NOT DETECTED NOT DETECTED Final   Candida tropicalis NOT DETECTED NOT DETECTED Final    Comment: Performed at [facility], [place] [address] [place], [place] [address]      Radiology Studies: No results found.  Scheduled Meds: . aspirin  81 mg Oral Daily  . atorvastatin  20 mg Oral Daily  . busPIRone  15 mg Oral TID  . Chlorhexidine Gluconate Cloth  6 each Topical Daily  . cholecalciferol  5,000 Units Oral Daily  . clonazepam  1 mg Oral BID  . divalproex  250 mg Oral Q12H  . enoxaparin (LOVENOX) injection  40 mg Subcutaneous Q12H  . feeding supplement (ENSURE ENLIVE)  237 mL Oral TID BM  . fluticasone  2 spray Each Nare Daily  . gabapentin  300 mg Oral 2 times per day   And  . gabapentin  600 mg Oral QHS  . insulin aspart  0-9 Units Subcutaneous TID WC  . insulin glargine  10 Units Subcutaneous Daily  . latanoprost  1 drop Both Eyes QHS  . mouth rinse  15 mL Mouth Rinse BID  . methylPREDNISolone  (SOLU-MEDROL) injection  40 mg Intravenous Q24H  . multivitamin with minerals  1 tablet Oral Daily  . pantoprazole  40 mg Oral Daily  . polyethylene glycol  17 g Oral Daily  . saccharomyces boulardii  250 mg Oral BID  . tamsulosin  0.4 mg Oral QPC breakfast  . venlafaxine XR  75 mg Oral Q breakfast   Continuous Infusions: . sodium chloride 999 mL/hr at 12/31/18 1027  . sodium chloride 10 mL/hr at 01/04/19 0400  . cefTRIAXone (ROCEPHIN)  IV Stopped (01/05/19 0256)     LOS: 18 days   Time spent: 25 minutes.  Patrecia Pour, MD [facility] [email or web address] Password [email or web address] 01/05/2019, 11:16 AM

## 2019-01-05 NOTE — Progress Notes (Signed)
Occupational Therapy Treatment Patient Details Name: Joel Greene MRN: [id number] DOB: 1951-03-23 Today's Date: 01/05/2019    History of present illness Pt is a 68 y.o. male admitted 12/18/18 with fever, chills and SOB; became progressively hypoxic and tachypnic, tested postiive for COVID-19. ETT 6/20-6/22. PMH includes OSA, IBS, DJD, glaucoma.   OT comments  Pt progressing towards established OT goals. Upon arrival, pt supine in bed and agreeable to therapy with education and encouragement. At rest, SpO2 90s on 4L, RR 30s, and HR 80s. Pt performing bed mobility with Min A and RR elevating to 40s at EOB with SpO2 dropping to 80s; elevated to 6L O2. Pt performing stand pivot transfer to recliner with Max A +2. Pt SpO2 dropping into 70s on 6L and RR elevated to 51, and pt requiring significant rest break and cues for purse lip breathing to recover. Elevated to 10 L O2 for recover in chair and weaned back to 6L after several minutes of rest. Continue to recommend dc to SNF and will continue to follow acutely as admitted.   Follow Up Recommendations  SNF;Supervision/Assistance - 24 hour    Equipment Recommendations  3 in 1 bedside commode    Recommendations for Other Services      Precautions / Restrictions Precautions Precautions: Fall Precaution Comments: monitor vitals; very very anxious Restrictions Weight Bearing Restrictions: No       Mobility Bed Mobility Overal bed mobility: Needs Assistance Bed Mobility: Supine to Sit     Supine to sit: Min assist     General bed mobility comments: Min A to elevate trunk  Transfers Overall transfer level: Needs assistance Equipment used: Rolling walker (2 wheeled) Transfers: Sit to/from [facility] Sit to Stand: Max assist;+2 physical assistance Stand pivot transfers: Max assist;+2 physical assistance       General transfer comment: Max A +2 to power up in to standing with bilateral knees blockes and use of bad to  elevate hips. Max A+2 to then maintain balance nad pivot to recliner. Pt requiring increased asisstance due anxiety with movement. RR elevating to 51 and SpO2 dropping to 78% on 6L.     Balance Overall balance assessment: Needs assistance Sitting-balance support: Feet supported;Bilateral upper extremity supported Sitting balance-Leahy Scale: Fair       Standing balance-Leahy Scale: Poor Standing balance comment: Reliant on physical A                           ADL either performed or assessed with clinical judgement   ADL Overall ADL's : Needs assistance/impaired                     Lower Body Dressing: Maximal assistance;Bed level Lower Body Dressing Details (indicate cue type and reason): Donning socks Toilet Transfer: Maximal assistance;Stand-pivot;+2 for safety/equipment;+2 for physical assistance(simulated to recliner)           Functional mobility during ADLs: Maximal assistance;+2 for physical assistance(stand pivot only) General ADL Comments: Pt performing stand pivot to recliner for breakfast. Benefiting from cues for slowing RR and purse lip breathing.      Vision       Perception     Praxis      Cognition Arousal/Alertness: Awake/alert Behavior During Therapy: WFL for tasks assessed/performed;Anxious Overall Cognitive Status: Impaired/Different from baseline Area of Impairment: Attention;Safety/judgement;Awareness;Problem solving;Memory                   Current Attention  Level: Sustained Memory: Decreased short-term memory   Safety/Judgement: Decreased awareness of safety;Decreased awareness of deficits Awareness: Emergent Problem Solving: Slow processing;Decreased initiation General Comments: Pt more agreeable to therapy this session. Benefits from calm environment. Pt requiring cues for performing purse lip breathing and to close RR.         Exercises Other Exercises Other Exercises: pursed lip breathing   Shoulder  Instructions       General Comments At rest, pt supine in bed and Spo2 90s on 4L O2, RR 30s, and HR 80s.  With actiivty, pt RR elevating to 51, SpO2 dropping to 78% on 6L, and HR elevating to 110s.     Pertinent Vitals/ Pain       Pain Assessment: Faces Faces Pain Scale: Hurts a little bit Pain Descriptors / Indicators: Discomfort Pain Intervention(s): Monitored during session;Limited activity within patient's tolerance;Repositioned  Home Living                                          Prior Functioning/Environment              Frequency  Min 3X/week        Progress Toward Goals  OT Goals(current goals can now be found in the care plan section)  Progress towards OT goals: Progressing toward goals  Acute Rehab OT Goals Patient Stated Goal: to breath better OT Goal Formulation: With patient Time For Goal Achievement: 01/06/19 Potential to Achieve Goals: Good ADL Goals Pt Will Perform Lower Body Bathing: with modified independence;with adaptive equipment;sit to/from stand Pt Will Perform Lower Body Dressing: with modified independence;with adaptive equipment;sit to/from stand Pt Will Transfer to Toilet: Independently;ambulating;bedside commode Pt Will Perform Toileting - Clothing Manipulation and hygiene: with modified independence;sit to/from stand Pt Will Perform Tub/Shower Transfer: with supervision;ambulating;3 in 1;Tub transfer Pt/caregiver will Perform Home Exercise Program: Increased strength;With theraband;With written HEP provided;Independently Additional ADL Goal #1: Pt will independently verbalize 3 energy conservation strategies for ADL adn IADL tasks  Plan Discharge plan remains appropriate    Co-evaluation    PT/OT/SLP Co-Evaluation/Treatment: Yes Reason for Co-Treatment: For patient/therapist safety;To address functional/ADL transfers   OT goals addressed during session: ADL's and self-care      AM-PAC OT "6 Clicks" Daily  Activity     Outcome Measure   Help from another person eating meals?: None Help from another person taking care of personal grooming?: A Little Help from another person toileting, which includes using toliet, bedpan, or urinal?: A Lot Help from another person bathing (including washing, rinsing, drying)?: A Lot Help from another person to put on and taking off regular upper body clothing?: A Little Help from another person to put on and taking off regular lower body clothing?: A Lot 6 Click Score: 16    End of Session Equipment Utilized During Treatment: Oxygen(4>6L)  OT Visit Diagnosis: Unsteadiness on feet (R26.81);Muscle weakness (generalized) (M62.81);Pain Pain - part of body: (back)   Activity Tolerance Patient tolerated treatment well   Patient Left in chair;with call bell/phone within reach   Nurse Communication Mobility status        Time: 2751-7001 OT Time Calculation (min): 33 min  Charges: OT General Charges $OT Visit: 1 Visit OT Treatments $Self Care/Home Management : 8-22 mins  [place], OTR/L Acute Rehab Pager: [phone] Office: [place] 01/05/2019, 11:39 AM

## 2019-01-05 NOTE — Progress Notes (Signed)
Physical Therapy Treatment Patient Details Name: Joel Greene MRN: [id number] DOB: 08/18/1950 Today's Date: 01/05/2019    History of Present Illness Pt is a 68 y.o. male admitted 12/18/18 with fever, chills and SOB; became progressively hypoxic and tachypnic, tested postiive for COVID-19. ETT 6/20-6/22. PMH includes OSA, IBS, DJD, glaucoma.    PT Comments    [place] was pleasant and participated in mobilizing to recliner which is about all that he can tolerate at this time. Patient was on 4 L HFNC when PT arrived and saO2 95%. While mobilizing saO2 dropping  To 75%(ear lobe probe), RR 44, Increased to 8 -10 L HFNC to recover as patient was very SOB. Returned to 4 L HFNC after recovery with SaO2 88%. Continue PT  For progressive mobility.  Follow Up Recommendations  Supervision - Intermittent;SNF     Equipment Recommendations  (tbd)    Recommendations for Other Services       Precautions / Restrictions Precautions Precautions: Fall Precaution Comments: monitor vitals; very very anxious Restrictions Weight Bearing Restrictions: No    Mobility  Bed Mobility Overal bed mobility: Needs Assistance Bed Mobility: Supine to Sit     Supine to sit: Min assist     General bed mobility comments: Min A to elevate trunk  Transfers Overall transfer level: Needs assistance Equipment used: Rolling walker (2 wheeled) Transfers: Sit to/from [facility] Sit to Stand: Max assist;+2 physical assistance Stand pivot transfers: Max assist;+2 physical assistance       General transfer comment: Max A +2 to power up in to standing with bilateral knees blockes and use of bad to elevate hips. Max A+2 to then maintain balance nad pivot to recliner. Pt requiring increased asisstance due anxiety with movement. RR elevating to 51 and SpO2 dropping to 78% on 6L.   Ambulation/Gait                 Stairs             Wheelchair Mobility    Modified Rankin (Stroke  Patients Only)       Balance Overall balance assessment: Needs assistance Sitting-balance support: Feet supported;Bilateral upper extremity supported Sitting balance-Leahy Scale: Fair       Standing balance-Leahy Scale: Poor Standing balance comment: Reliant on physical A                            Cognition Arousal/Alertness: Awake/alert Behavior During Therapy: WFL for tasks assessed/performed;Anxious Overall Cognitive Status: Impaired/Different from baseline Area of Impairment: Attention;Safety/judgement;Awareness;Problem solving;Memory                   Current Attention Level: Sustained Memory: Decreased short-term memory   Safety/Judgement: Decreased awareness of safety;Decreased awareness of deficits Awareness: Emergent Problem Solving: Slow processing;Decreased initiation General Comments: Pt more agreeable to therapy this session. Benefits from calm environment. Pt requiring cues for performing purse lip breathing and to close RR.       Exercises Other Exercises Other Exercises: pursed lip breathing    General Comments General comments (skin integrity, edema, etc.): At rest, pt supine in bed and Spo2 90s on 4L O2, RR 30s, and HR 80s.  With actiivty, pt RR elevating to 51, SpO2 dropping to 78% on 6L, and HR elevating to 110s.       Pertinent Vitals/Pain Pain Assessment: Faces Faces Pain Scale: Hurts even more Pain Location: "everything", mouth sores Pain Descriptors / Indicators: Discomfort Pain Intervention(s):  Monitored during session;Limited activity within patient's tolerance;Repositioned    Home Living                      Prior Function            PT Goals (current goals can now be found in the care plan section) Acute Rehab PT Goals Patient Stated Goal: to breath better PT Goal Formulation: With patient Time For Goal Achievement: 01/19/19 Potential to Achieve Goals: Fair Additional Goals Additional Goal #1: Patient  will demonstrate proper techniques to raise his SaO2 when it drops. Progress towards PT goals: Progressing toward goals;Goals downgraded-see care plan    Frequency    Min 3X/week      PT Plan Discharge plan needs to be updated    Co-evaluation PT/OT/SLP Co-Evaluation/Treatment: Yes Reason for Co-Treatment: Complexity of the patient's impairments (multi-system involvement);For patient/therapist safety PT goals addressed during session: Mobility/safety with mobility OT goals addressed during session: ADL's and self-care      AM-PAC PT "6 Clicks" Mobility   Outcome Measure  Help needed turning from your back to your side while in a flat bed without using bedrails?: A Little Help needed moving from lying on your back to sitting on the side of a flat bed without using bedrails?: A Little Help needed moving to and from a bed to a chair (including a wheelchair)?: A Lot Help needed standing up from a chair using your arms (e.g., wheelchair or bedside chair)?: A Lot Help needed to walk in hospital room?: Total Help needed climbing 3-5 steps with a railing? : Total 6 Click Score: 12    End of Session Equipment Utilized During Treatment: Oxygen;Gait belt Activity Tolerance: Patient tolerated treatment well Patient left: in chair;with call bell/phone within reach;with chair alarm set Nurse Communication: Mobility status;Other (comment) PT Visit Diagnosis: Difficulty in walking, not elsewhere classified (R26.2)     Time: 4010-2725 PT Time Calculation (min) (ACUTE ONLY): 46 min  Charges:  $Therapeutic Activity: 8-22 mins                     Tresa Endo PT Acute Rehabilitation Services [phone] Office [phone]    [name] 01/05/2019, 1:41 PM

## 2019-01-06 LAB — CBC
HCT: 36.9 % — ABNORMAL LOW (ref 39.0–52.0)
Hemoglobin: 12 g/dL — ABNORMAL LOW (ref 13.0–17.0)
MCH: 32.2 pg (ref 26.0–34.0)
MCHC: 32.5 g/dL (ref 30.0–36.0)
MCV: 98.9 fL (ref 80.0–100.0)
Platelets: 132 10*3/uL — ABNORMAL LOW (ref 150–400)
RBC: 3.73 MIL/uL — ABNORMAL LOW (ref 4.22–5.81)
RDW: 14.3 % (ref 11.5–15.5)
WBC: 4 10*3/uL (ref 4.0–10.5)
nRBC: 0.7 % — ABNORMAL HIGH (ref 0.0–0.2)

## 2019-01-06 LAB — COMPREHENSIVE METABOLIC PANEL
ALT: 167 U/L — ABNORMAL HIGH (ref 0–44)
AST: 40 U/L (ref 15–41)
Albumin: 2.5 g/dL — ABNORMAL LOW (ref 3.5–5.0)
Alkaline Phosphatase: 97 U/L (ref 38–126)
Anion gap: 13 (ref 5–15)
BUN: 21 mg/dL (ref 8–23)
CO2: 28 mmol/L (ref 22–32)
Calcium: 8.6 mg/dL — ABNORMAL LOW (ref 8.9–10.3)
Chloride: 97 mmol/L — ABNORMAL LOW (ref 98–111)
Creatinine, Ser: 0.61 mg/dL (ref 0.61–1.24)
GFR calc Af Amer: >60 mL/min (ref >60)
GFR calc non Af Amer: >60 mL/min (ref >60)
Glucose, Bld: 164 mg/dL — ABNORMAL HIGH (ref 70–99)
Potassium: 4 mmol/L (ref 3.5–5.1)
Sodium: 138 mmol/L (ref 135–145)
Total Bilirubin: 0.3 mg/dL (ref 0.3–1.2)
Total Protein: 4.8 g/dL — ABNORMAL LOW (ref 6.5–8.1)

## 2019-01-06 LAB — GLUCOSE, CAPILLARY
Glucose-Capillary: 186 mg/dL — ABNORMAL HIGH (ref 70–99)
Glucose-Capillary: 212 mg/dL — ABNORMAL HIGH (ref 70–99)
Glucose-Capillary: 136 mg/dL — ABNORMAL HIGH (ref 70–99)
Glucose-Capillary: 184 mg/dL — ABNORMAL HIGH (ref 70–99)

## 2019-01-06 LAB — D-DIMER, QUANTITATIVE: D-Dimer, Quant: 0.82 ug/mL-FEU — ABNORMAL HIGH (ref 0.00–0.50)

## 2019-01-06 LAB — FERRITIN: Ferritin: 753 ng/mL — ABNORMAL HIGH (ref 24–336)

## 2019-01-06 LAB — C-REACTIVE PROTEIN: CRP: 1.1 mg/dL — ABNORMAL HIGH (ref <1.0)

## 2019-01-06 NOTE — Progress Notes (Signed)
Spoke with pt brother Audry Pili and updated on pt condition as well as answered all questions and concerns.

## 2019-01-06 NOTE — Progress Notes (Signed)
PROGRESS NOTE  Joel Greene  VQM:[id number] DOB: 31-May-1951 DOA: 12/18/2018 PCP: Redmond School, MD   Brief Narrative: Patient is a 68 y.o. male with PMHx of CAD, depression with anxiety, chronic pain-presented with fever, shortness of breath-he unfortunately became progressively hypoxic and was intubated and transferred to [facility].  Treated with steroids, Remdesivir, Actemra and convalescent plasma.  Subsequently extubated on 6/22.  Hospital course has then been complicated by extreme anxiety and lately by superimposed Streptococcus pneumoniae pneumonia and bacteremia.   Assessment & Plan: Active Problems:   Acute respiratory failure with hypoxia (HCC)   Sepsis due to undetermined organism ([place])   Pneumonia due to COVID-19 virus   Acute respiratory failure with hypoxemia (HCC)   Hypokalemia   Encounter for intubation   History of ETT  Acute hypoxic respiratory failure due to covid-19 pneumonia: Received remdesivir, tocilizumab and convalescent plasma.  - Continues to require HFNC - Continue airborne, contact precautions. PPE including surgical gown, gloves, cap, shoe covers, and CAPR used during this encounter in a negative pressure room.  - Inflammatory markers, once normalized, now returning to downward trend following Tx Pneumococcus. Stopped steroids as he had received >2 weeks and need to clear bacteremia.  - Maintain euvolemia/net negative.  - Avoid NSAIDs - Recommend aggressive use of incentive spirometry.  Sepsis due to Pneumococcal pneumonia and bacteremia: Sepsis has resolved. WBC normal, afebrile. CT showed RML, RLL infiltrates without complication (e.g. cavitation, empyema).  - Continue ceftriaxone 2g IV q24h, susceptibility documented on initial cultures. Started cefepime 7/5, transitioned to CTX 7/6. - Discussed with Dr. Prince [place], ID on 7/8. Given use of tocilizumab and bacteremia, will stop steroids (already given since admission). Will require 14 days IV  therapy.   - Monitor blood cultures. If remains negative from 7/8 could insert PICC and proceed with 14 days IV Tx.   Right perinephric stranding: Incidentally noted on CT chest 7/8. No dysuria noted, on CTX regardless.   Oral candidiasis: Mouth sores dramatically improved - Magic mouthwash QID prn - Diflucan x7 days  Anxiety: Severe, required precedex followed by benzodiazepine taper - Continue xanax.  - Continue buspar, clonazepam, depakote, and effexor.   Acute on chronic HFpEF: Improved volume status.  - Continue to monitor I/O, daily weights  HTN:  - Monitor, prn hydralazine  BPH:  - Continue flomax, monitor UOP  OSA: Unable to use CPAP currently.   T2DM with steroid-induced hyperglycemia: HbA1c 6.5%. - Continue lantus 10u + SSI  DVT prophylaxis: Lovenox Code Status: DNR Family Communication: None at bedside, previously stated he'd discuss with family. Disposition Plan: Involve CSW later, hopes for [facility]. Continue PT/OT regularly due to severe deconditioning.100% on 6L [place], so will wean O2 as tolerated.  Consultants:   PCCM  Procedures:   ETT 6/20 - 6/22  RUE PICC 6/21 6/27 - 7/7 returned to ICU due to progressive hypoxia, required precedex for severe anxiety.   Antimicrobials: 6/20 zosyn x1 6/20 vanc x1 6/21 azithro > 6/23 6/21 ceftriaxone > 6/23 6/20 remdesivir >  6/26 actemra x1 6/26 Convalescent plasma  Subjective: Remains dyspneic at rest, but generally feeling better. No chest pain, no fevers. Interested in SNF's in [place] as he continues to feel severely weak diffusely.   Objective: Vitals:   01/06/19 0400 01/06/19 0443 01/06/19 0500 01/06/19 0840  BP:  121/77  135/83  Pulse: 70 74  98  Resp:  18    Temp:  98.2 F (36.8 C)  98.4 F (36.9 C)  TempSrc:  Oral  Oral  SpO2: 100% 100%  95%  Weight:   75 kg   Height:        Intake/Output Summary (Last 24 hours) at 01/06/2019 1023 Last data filed at 01/06/2019 0900 Gross per 24 hour    Intake 1037.43 ml  Output 1150 ml  Net -112.57 ml   Filed Weights   01/01/19 0400 01/05/19 0428 01/06/19 0500  Weight: 74.6 kg 74.8 kg 75 kg   Gen: Frail male in no distress Pulm: Tachypneic at rest, no accessory muscle use with supplemental oxygen, Right-sided crackles anteriorly, no wheezes. CV: Regular rate and rhythm. No murmur, rub, or gallop. No JVD, no dependent edema. GI: Abdomen soft, non-tender, non-distended, with normoactive bowel sounds.  Ext: Warm, no deformities Skin: No rashes, lesions or ulcers on visualized skin. Neuro: Alert and oriented. Diffusely weak. No focal neurological deficits. Psych: Judgement and insight appear fair. Mood euthymic & affect congruent. Behavior is appropriate.    Data Reviewed: I have personally reviewed following labs and imaging studies  CBC: Recent Labs  Lab 12/31/18 0500 01/01/19 0200  01/02/19 0414 01/03/19 0500 01/04/19 0442 01/05/19 0415 01/06/19 0500  WBC 9.0 8.5  --  10.0 3.1* 4.8 4.3 4.0  NEUTROABS 7.4 7.1  --   --   --   --   --   --   HGB 14.7 14.5   < > 15.7 12.1* 11.5* 11.6* 12.0*  HCT 42.9 44.9   < > 47.7 35.8* 34.0* 35.2* 36.9*  MCV 97.1 99.6  --  97.9 98.9 96.9 97.8 98.9  PLT 293 309  --  304 PLATELET CLUMPS NOTED ON SMEAR, UNABLE TO ESTIMATE 134* 132* 132*   < > = values in this interval not displayed.   Basic Metabolic Panel: Recent Labs  Lab 12/31/18 0500 01/01/19 0200  01/02/19 0414 01/03/19 0500 01/04/19 0442 01/05/19 0415 01/06/19 0500  NA 138 140   < > 139 137 138 138 138  K 4.1 3.9   < > 4.4 4.0 3.9 3.7 4.0  CL 99 101  --  101 108 107 104 97*  CO2 28 22  --  19* 22 22 26 28   GLUCOSE 145* 186*  --  129* 235* 217* 180* 164*  BUN 33* 29*  --  23 24* 23 22 21   CREATININE 0.71 0.84  --  1.14 0.70 0.64 0.62 0.61  CALCIUM 8.8* 8.6*  --  7.9* 7.4* 8.2* 8.7* 8.6*  MG 2.4 2.6*  --   --  2.5*  --   --   --    < > = values in this interval not displayed.   GFR: Estimated Creatinine Clearance: 81.5  mL/min (by C-G formula based on SCr of 0.61 mg/dL). Liver Function Tests: Recent Labs  Lab 01/02/19 0414 01/03/19 0500 01/04/19 0442 01/05/19 0415 01/06/19 0500  AST 50* 32 43* 74* 40  ALT 147* 71* 79* 155* 167*  ALKPHOS 114 76 79 99 97  BILITOT 0.8 0.6 0.5 0.6 0.3  PROT 5.3* 4.4* 4.6* 4.8* 4.8*  ALBUMIN 2.7* 2.0* 2.1* 2.4* 2.5*   No results for input(s): LIPASE, AMYLASE in the last 168 hours. No results for input(s): AMMONIA in the last 168 hours. Coagulation Profile: No results for input(s): INR, PROTIME in the last 168 hours. Cardiac Enzymes: No results for input(s): CKTOTAL, CKMB, CKMBINDEX, TROPONINI in the last 168 hours. BNP (last 3 results) No results for input(s): PROBNP in the last 8760 hours. HbA1C: No results for input(s):  HGBA1C in the last 72 hours. CBG: Recent Labs  Lab 01/05/19 0827 01/05/19 1239 01/05/19 1703 01/05/19 2125 01/06/19 0839  GLUCAP 208* 139* 254* 242* 186*   Lipid Profile: No results for input(s): CHOL, HDL, LDLCALC, TRIG, CHOLHDL, LDLDIRECT in the last 72 hours. Thyroid Function Tests: No results for input(s): TSH, T4TOTAL, FREET4, T3FREE, THYROIDAB in the last 72 hours. Anemia Panel: Recent Labs    01/05/19 0415 01/06/19 0500  FERRITIN 1,227* 753*   Urine analysis:    Component Value Date/Time   COLORURINE YELLOW 12/18/2018 1527   APPEARANCEUR HAZY (A) 12/18/2018 1527   LABSPEC 1.019 12/18/2018 1527   PHURINE 6.0 12/18/2018 1527   GLUCOSEU NEGATIVE 12/18/2018 1527   HGBUR NEGATIVE 12/18/2018 1527   BILIRUBINUR NEGATIVE 12/18/2018 1527   KETONESUR NEGATIVE 12/18/2018 1527   PROTEINUR 30 (A) 12/18/2018 1527   UROBILINOGEN 0.2 02/06/2015 1353   NITRITE NEGATIVE 12/18/2018 1527   LEUKOCYTESUR NEGATIVE 12/18/2018 1527   Recent Results (from the past 240 hour(s))  Culture, blood (routine x 2)     Status: Abnormal   Collection Time: 01/02/19 11:40 AM   Specimen: BLOOD  Result Value Ref Range Status   Specimen Description    Final    BLOOD LEFT ANTECUBITAL Performed at [facility] [address] [place], [place] [address]    Special Requests   Final    BOTTLES DRAWN AEROBIC ONLY Blood Culture adequate volume Performed at [facility] [address], [place], [place] [address]    Culture  Setup Time   Final    GRAM POSITIVE COCCI AEROBIC BOTTLE ONLY CRITICAL VALUE NOTED.  VALUE IS CONSISTENT WITH PREVIOUSLY REPORTED AND CALLED VALUE.    Culture (A)  Final    STREPTOCOCCUS PNEUMONIAE SUSCEPTIBILITIES PERFORMED ON PREVIOUS CULTURE WITHIN THE LAST 5 DAYS. Performed at [facility], [place] [address], [place], [place] [address]    Report Status 01/05/2019 FINAL  Final  Culture, blood (routine x 2)     Status: Abnormal   Collection Time: 01/02/19 11:45 AM   Specimen: BLOOD  Result Value Ref Range Status   Specimen Description   Final    BLOOD LEFT HAND Performed at [facility] [address], [place], [place] [address]    Special Requests   Final    BOTTLES DRAWN AEROBIC ONLY Blood Culture adequate volume Performed at [facility] [address], [place], [place] [address]    Culture  Setup Time   Final    GRAM POSITIVE COCCI AEROBIC BOTTLE ONLY CRITICAL RESULT CALLED TO, READ BACK BY AND VERIFIED WITH: Jene Every PharmD 9:40 01/03/19 ([email or web address]) Performed at [facility], [place] [address] [place], [place] [address]    Culture STREPTOCOCCUS PNEUMONIAE (A)  Final   Report Status 01/05/2019 FINAL  Final   Organism ID, Bacteria STREPTOCOCCUS PNEUMONIAE  Final      Susceptibility   Streptococcus pneumoniae - MIC*    ERYTHROMYCIN >=8 RESISTANT Resistant     LEVOFLOXACIN 1 SENSITIVE Sensitive     VANCOMYCIN 0.5 SENSITIVE Sensitive     PENICILLIN (non-meningitis) <=0.06 SENSITIVE Sensitive     CEFTRIAXONE (non-meningitis) <=0.12 SENSITIVE Sensitive     * STREPTOCOCCUS PNEUMONIAE  Blood Culture ID Panel (Reflexed)      Status: Abnormal   Collection Time: 01/02/19 11:45 AM  Result Value Ref Range Status   Enterococcus species NOT DETECTED NOT DETECTED Final   Listeria monocytogenes NOT DETECTED NOT DETECTED  Final   Staphylococcus species NOT DETECTED NOT DETECTED Final   Staphylococcus aureus (BCID) NOT DETECTED NOT DETECTED Final   Streptococcus species DETECTED (A) NOT DETECTED Final    Comment: CRITICAL RESULT CALLED TO, READ BACK BY AND VERIFIED WITH: Jene Every PharmD 9:40 01/03/19 ([email or web address])    Streptococcus agalactiae NOT DETECTED NOT DETECTED Final   Streptococcus pneumoniae DETECTED (A) NOT DETECTED Final    Comment: CRITICAL RESULT CALLED TO, READ BACK BY AND VERIFIED WITH: Jene Every PharmD 9:40 01/03/19 ([email or web address])    Streptococcus pyogenes NOT DETECTED NOT DETECTED Final   Acinetobacter baumannii NOT DETECTED NOT DETECTED Final   Enterobacteriaceae species NOT DETECTED NOT DETECTED Final   Enterobacter cloacae complex NOT DETECTED NOT DETECTED Final   Escherichia coli NOT DETECTED NOT DETECTED Final   Klebsiella oxytoca NOT DETECTED NOT DETECTED Final   Klebsiella pneumoniae NOT DETECTED NOT DETECTED Final   Proteus species NOT DETECTED NOT DETECTED Final   Serratia marcescens NOT DETECTED NOT DETECTED Final   Haemophilus influenzae NOT DETECTED NOT DETECTED Final   Neisseria meningitidis NOT DETECTED NOT DETECTED Final   Pseudomonas aeruginosa NOT DETECTED NOT DETECTED Final   Candida albicans NOT DETECTED NOT DETECTED Final   Candida glabrata NOT DETECTED NOT DETECTED Final   Candida krusei NOT DETECTED NOT DETECTED Final   Candida parapsilosis NOT DETECTED NOT DETECTED Final   Candida tropicalis NOT DETECTED NOT DETECTED Final    Comment: Performed at [facility], [place]. [address], [place], [place] [address]      Radiology Studies: Ct Chest Wo Contrast  Result Date: 01/05/2019 CLINICAL DATA:  Hospitalization for COVID-19/respiratory failure, hospitalization complicated by superimposed  Streptococcus pneumonia and bacteremia EXAM: CT CHEST WITHOUT CONTRAST TECHNIQUE: Multidetector CT imaging of the chest was performed following the standard protocol without IV contrast. COMPARISON:  Numerous prior chest radiographs, most recently December 18, 2018 FINDINGS: Cardiovascular: Coronary calcifications and calcification of the aortic valve leaflets. Cardiac size is normal. Mild central pulmonary arterial enlargement. Mediastinum/Nodes: Few reactive appearing low-attenuation mediastinal and hilar nodes. Thyroid gland, trachea and esophagus demonstrate no significant abnormality. Lungs/Pleura: Diffuse airways thickening and scattered secretions. There are scattered peripheral areas of ground-glass opacity throughout both lungs. More focal consolidation is present in the right middle and lower lobe and posterior basal segment of the left lower lobe. Bronchiectatic changes are present of the right lower lobe airways. Superimposed areas of volume loss in both lung bases. Upper Abdomen: Mild nonspecific stranding seen along the superior aspect of the right kidney. Musculoskeletal: Flowing anterior vertebral body osteophytosis compatible with diffuse idiopathic skeletal hyperostosis. No acute osseous abnormality or suspicious osseous lesion. 01 soft tissues of the chest wall are unremarkable. IMPRESSION: Findings compatible with patient's history of viral pneumonia with bacterial superinfection. Airspace disease most pronounced in the right middle and lower lobes. Likely developing architectural distortion and scarring with bronchiectatic changes the right lower lobe airways. Reactive mediastinal and hilar adenopathy. Central pulmonary arterial enlargement may reflect pulmonary arterial hypertension. Osseous features compatible with diffuse idiopathic skeletal hyperostosis. Mild nonspecific right perinephric stranding, incompletely assessed on this exam. Correlate with renal function. Aortic Atherosclerosis  (ICD10-I70.0). Electronically Signed   By: MD Lovena Le   On: 01/05/2019 18:16    Scheduled Meds:  aspirin  81 mg Oral Daily   atorvastatin  20 mg Oral Daily   busPIRone  15 mg Oral TID   Chlorhexidine Gluconate Cloth  6 each Topical Daily   cholecalciferol  5,000 Units Oral Daily  clonazepam  1 mg Oral BID   divalproex  250 mg Oral Q12H   enoxaparin (LOVENOX) injection  40 mg Subcutaneous Q12H   feeding supplement (ENSURE ENLIVE)  237 mL Oral TID BM   fluconazole  100 mg Oral Daily   fluticasone  2 spray Each Nare Daily   gabapentin  300 mg Oral 2 times per day   And   gabapentin  600 mg Oral QHS   insulin aspart  0-9 Units Subcutaneous TID WC   insulin glargine  10 Units Subcutaneous Daily   latanoprost  1 drop Both Eyes QHS   mouth rinse  15 mL Mouth Rinse BID   multivitamin with minerals  1 tablet Oral Daily   pantoprazole  40 mg Oral Daily   polyethylene glycol  17 g Oral Daily   saccharomyces boulardii  250 mg Oral BID   tamsulosin  0.4 mg Oral QPC breakfast   venlafaxine XR  75 mg Oral Q breakfast   Continuous Infusions:  sodium chloride 999 mL/hr at 12/31/18 1027   sodium chloride Stopped (01/04/19 1630)   cefTRIAXone (ROCEPHIN)  IV Stopped (01/05/19 1626)     LOS: 19 days   Time spent: 25 minutes.  Patrecia Pour, MD [facility] [email or web address] Password [email or web address] 01/06/2019, 10:23 AM

## 2019-01-07 LAB — CBC
HCT: 43.8 % (ref 39.0–52.0)
Hemoglobin: 14.3 g/dL (ref 13.0–17.0)
MCH: 32.1 pg (ref 26.0–34.0)
MCHC: 32.6 g/dL (ref 30.0–36.0)
MCV: 98.4 fL (ref 80.0–100.0)
Platelets: 147 10*3/uL — ABNORMAL LOW (ref 150–400)
RBC: 4.45 MIL/uL (ref 4.22–5.81)
RDW: 14.3 % (ref 11.5–15.5)
WBC: 3.6 10*3/uL — ABNORMAL LOW (ref 4.0–10.5)
nRBC: 1.1 % — ABNORMAL HIGH (ref 0.0–0.2)

## 2019-01-07 LAB — COMPREHENSIVE METABOLIC PANEL
ALT: 167 U/L — ABNORMAL HIGH (ref 0–44)
AST: 44 U/L — ABNORMAL HIGH (ref 15–41)
Albumin: 3 g/dL — ABNORMAL LOW (ref 3.5–5.0)
Alkaline Phosphatase: 112 U/L (ref 38–126)
Anion gap: 15 (ref 5–15)
BUN: 19 mg/dL (ref 8–23)
CO2: 26 mmol/L (ref 22–32)
Calcium: 9.1 mg/dL (ref 8.9–10.3)
Chloride: 98 mmol/L (ref 98–111)
Creatinine, Ser: 0.61 mg/dL (ref 0.61–1.24)
GFR calc Af Amer: >60 mL/min (ref >60)
GFR calc non Af Amer: >60 mL/min (ref >60)
Glucose, Bld: 129 mg/dL — ABNORMAL HIGH (ref 70–99)
Potassium: 3.8 mmol/L (ref 3.5–5.1)
Sodium: 139 mmol/L (ref 135–145)
Total Bilirubin: 0.2 mg/dL — ABNORMAL LOW (ref 0.3–1.2)
Total Protein: 5.6 g/dL — ABNORMAL LOW (ref 6.5–8.1)

## 2019-01-07 LAB — GLUCOSE, CAPILLARY
Glucose-Capillary: 123 mg/dL — ABNORMAL HIGH (ref 70–99)
Glucose-Capillary: 199 mg/dL — ABNORMAL HIGH (ref 70–99)
Glucose-Capillary: 169 mg/dL — ABNORMAL HIGH (ref 70–99)
Glucose-Capillary: 175 mg/dL — ABNORMAL HIGH (ref 70–99)

## 2019-01-07 LAB — C-REACTIVE PROTEIN: CRP: 0.8 mg/dL (ref <1.0)

## 2019-01-07 LAB — D-DIMER, QUANTITATIVE: D-Dimer, Quant: 0.85 ug/mL-FEU — ABNORMAL HIGH (ref 0.00–0.50)

## 2019-01-07 MED ORDER — MAGIC MOUTHWASH W/LIDOCAINE
15.0000 mL | Freq: Three times a day (TID) | ORAL | Status: DC
  Administered 2019-01-07 – 2019-01-13 (×16): 15 mL via ORAL
  Filled 2019-01-07 (×22): qty 15

## 2019-01-07 NOTE — Progress Notes (Signed)
PROGRESS NOTE  Joel Greene  IRC:[id number] DOB: [birth year] DOA: 12/18/2018 PCP: Redmond School, MD   Brief Narrative: Patient is a 68 y.o. male with PMHx of CAD, depression with anxiety, chronic pain-presented with fever, shortness of breath-he unfortunately became progressively hypoxic and was intubated and transferred to [facility].  Treated with steroids, Remdesivir, Actemra and convalescent plasma.  Subsequently extubated on 6/22.  Hospital course has then been complicated by extreme anxiety and lately by superimposed Streptococcus pneumoniae pneumonia and bacteremia.   Assessment & Plan: Active Problems:   Acute respiratory failure with hypoxia (HCC)   Sepsis due to undetermined organism ([place])   Pneumonia due to COVID-19 virus   Acute respiratory failure with hypoxemia (HCC)   Hypokalemia   Encounter for intubation   History of ETT  Acute hypoxic respiratory failure due to covid-19 pneumonia: Received remdesivir, tocilizumab and convalescent plasma.  - Continue supplemental oxygen as needed to maintain normal respiratory effort. - Continue airborne, contact precautions. PPE including surgical gown, gloves, cap, shoe covers, and CAPR used during this encounter in a negative pressure room.  - Inflammatory markers, once normalized, now returning to downward trend following Tx Pneumococcus. Stopped steroids as he had received >2 weeks and need to clear bacteremia.  - Maintain euvolemia/net negative.  - Avoid NSAIDs - Recommend aggressive use of incentive spirometry.  Sepsis due to Pneumococcal pneumonia and bacteremia: Sepsis has resolved. WBC normal, afebrile. CT showed RML, RLL infiltrates without complication (e.g. cavitation, empyema).  - Continue ceftriaxone 2g IV q24h, susceptibility documented on initial cultures. Started cefepime 7/5, transitioned to CTX 7/6. - Discussed with Dr. Prince Rome, ID on 7/8. Will require 14 days IV therapy.   - Monitor blood cultures. If  remains negative >48 hours, will insert PICC and proceed with 14 days IV Tx.   Right perinephric stranding: Incidentally noted on CT chest 7/8. No dysuria noted, on CTX regardless.   Oral candidiasis: Mouth sores dramatically improved - Magic mouthwash TID scheduled - Diflucan x7 days  Anxiety: Severe, required precedex followed by benzodiazepine taper. Improving. - Continue xanax.  - Continue buspar, clonazepam, depakote, and effexor.   Acute on chronic HFpEF: Improved volume status.  - Continue to monitor I/O, daily weights  HTN:  - Monitor, prn hydralazine  BPH:  - Continue flomax, monitor UOP  OSA: Unable to use CPAP currently.   T2DM with steroid-induced hyperglycemia: HbA1c 6.5%. At inpatient goal. - Continue lantus 10u + SSI  LFT elevation: ALT stable at 167.  - Monitor intermittently.  Thrombocytopenia: Mild, trending upward, not consistent with HITT.  - Continue to monitor.  DVT prophylaxis: Lovenox Code Status: DNR Family Communication: None at bedside. Disposition Plan: Uncertain. Will require 14 days IV antibiotics. Current recommendation is SNF, CSW consulted. Will have PT/OT reevaluate based on his clinical progress, it is possible that he could return home early next week.   Consultants:   PCCM  Procedures:   ETT 6/20 - 6/22  RUE PICC 6/21 6/27 - 7/7 returned to ICU due to progressive hypoxia, required precedex for severe anxiety.   Antimicrobials: 6/20 zosyn x1 6/20 vanc x1 6/21 azithro > 6/23 6/21 ceftriaxone > 6/23 6/20 remdesivir >  6/26 actemra x1 6/26 Convalescent plasma  Subjective: Dyspnea has improved over past 24 hours, he's doing leg raises in bed to help recoup strength. Trying to wean himself from oxygen. No chest pain or other pain. Mouth sores continue to be painful, improving   Objective: Vitals:   01/06/19 2343 01/07/19  0430 01/07/19 0500 01/07/19 0802  BP: 127/80 (!) 147/104 122/88 (!) 138/91  Pulse: (!) 105 (!) 111  (!)  110  Resp: (!) 24 (!) 23  (!) 29  Temp: 98 F (36.7 C) 98.4 F (36.9 C)  98.5 F (36.9 C)  TempSrc: Oral Oral  Oral  SpO2:  97%  98%  Weight:   73.2 kg   Height:        Intake/Output Summary (Last 24 hours) at 01/07/2019 1028 Last data filed at 01/07/2019 0200 Gross per 24 hour  Intake 360 ml  Output 3402 ml  Net -3042 ml   Filed Weights   01/05/19 0428 01/06/19 0500 01/07/19 0500  Weight: 74.8 kg 75 kg 73.2 kg   Gen: Pleasant, frail male in no distress Pulm: Nonlabored tachypnea. Crackles R > L. CV: Regular tachycardia. No murmur, rub, or gallop. No JVD, no dependent edema. GI: Abdomen soft, non-tender, non-distended, with normoactive bowel sounds.  Ext: Warm, no deformities Skin: No rashes, lesions or ulcers on visualized skin. Neuro: Alert and oriented. No focal neurological deficits. Psych: Judgement and insight appear fair. Mood euthymic & affect congruent. Behavior is appropriate.    Data Reviewed: I have personally reviewed following labs and imaging studies  CBC: Recent Labs  Lab 01/01/19 0200  01/03/19 0500 01/04/19 0442 01/05/19 0415 01/06/19 0500 01/07/19 0500  WBC 8.5   < > 3.1* 4.8 4.3 4.0 3.6*  NEUTROABS 7.1  --   --   --   --   --   --   HGB 14.5   < > 12.1* 11.5* 11.6* 12.0* 14.3  HCT 44.9   < > 35.8* 34.0* 35.2* 36.9* 43.8  MCV 99.6   < > 98.9 96.9 97.8 98.9 98.4  PLT 309   < > PLATELET CLUMPS NOTED ON SMEAR, UNABLE TO ESTIMATE 134* 132* 132* 147*   < > = values in this interval not displayed.   Basic Metabolic Panel: Recent Labs  Lab 01/01/19 0200  01/03/19 0500 01/04/19 0442 01/05/19 0415 01/06/19 0500 01/07/19 0500  NA 140   < > 137 138 138 138 139  K 3.9   < > 4.0 3.9 3.7 4.0 3.8  CL 101   < > 108 107 104 97* 98  CO2 22   < > 22 22 26 28 26   GLUCOSE 186*   < > 235* 217* 180* 164* 129*  BUN 29*   < > 24* 23 22 21 19   CREATININE 0.84   < > 0.70 0.64 0.62 0.61 0.61  CALCIUM 8.6*   < > 7.4* 8.2* 8.7* 8.6* 9.1  MG 2.6*  --  2.5*  --    --   --   --    < > = values in this interval not displayed.   GFR: Estimated Creatinine Clearance: 81.5 mL/min (by C-G formula based on SCr of 0.61 mg/dL). Liver Function Tests: Recent Labs  Lab 01/03/19 0500 01/04/19 0442 01/05/19 0415 01/06/19 0500 01/07/19 0500  AST 32 43* 74* 40 44*  ALT 71* 79* 155* 167* 167*  ALKPHOS 76 79 99 97 112  BILITOT 0.6 0.5 0.6 0.3 0.2*  PROT 4.4* 4.6* 4.8* 4.8* 5.6*  ALBUMIN 2.0* 2.1* 2.4* 2.5* 3.0*   No results for input(s): LIPASE, AMYLASE in the last 168 hours. No results for input(s): AMMONIA in the last 168 hours. Coagulation Profile: No results for input(s): INR, PROTIME in the last 168 hours. Cardiac Enzymes: No results for input(s): CKTOTAL, CKMB,  CKMBINDEX, TROPONINI in the last 168 hours. BNP (last 3 results) No results for input(s): PROBNP in the last 8760 hours. HbA1C: No results for input(s): HGBA1C in the last 72 hours. CBG: Recent Labs  Lab 01/06/19 0839 01/06/19 1136 01/06/19 1711 01/06/19 2221 01/07/19 0800  GLUCAP 186* 212* 136* 184* 123*   Lipid Profile: No results for input(s): CHOL, HDL, LDLCALC, TRIG, CHOLHDL, LDLDIRECT in the last 72 hours. Thyroid Function Tests: No results for input(s): TSH, T4TOTAL, FREET4, T3FREE, THYROIDAB in the last 72 hours. Anemia Panel: Recent Labs    01/05/19 0415 01/06/19 0500  FERRITIN 1,227* 753*   Urine analysis:    Component Value Date/Time   COLORURINE YELLOW 12/18/2018 1527   APPEARANCEUR HAZY (A) 12/18/2018 1527   LABSPEC 1.019 12/18/2018 1527   PHURINE 6.0 12/18/2018 1527   GLUCOSEU NEGATIVE 12/18/2018 1527   HGBUR NEGATIVE 12/18/2018 1527   BILIRUBINUR NEGATIVE 12/18/2018 1527   KETONESUR NEGATIVE 12/18/2018 1527   PROTEINUR 30 (A) 12/18/2018 1527   UROBILINOGEN 0.2 02/06/2015 1353   NITRITE NEGATIVE 12/18/2018 1527   LEUKOCYTESUR NEGATIVE 12/18/2018 1527   Recent Results (from the past 240 hour(s))  Culture, blood (routine x 2)     Status: Abnormal    Collection Time: 01/02/19 11:40 AM   Specimen: BLOOD  Result Value Ref Range Status   Specimen Description   Final    BLOOD LEFT ANTECUBITAL Performed at [facility] [address] [place], [place] [address]    Special Requests   Final    BOTTLES DRAWN AEROBIC ONLY Blood Culture adequate volume Performed at [facility] [address], [place], [place] [address]    Culture  Setup Time   Final    GRAM POSITIVE COCCI AEROBIC BOTTLE ONLY CRITICAL VALUE NOTED.  VALUE IS CONSISTENT WITH PREVIOUSLY REPORTED AND CALLED VALUE.    Culture (A)  Final    STREPTOCOCCUS PNEUMONIAE SUSCEPTIBILITIES PERFORMED ON PREVIOUS CULTURE WITHIN THE LAST 5 DAYS. Performed at [facility], [place] [address] [place], [place] [address]    Report Status 01/05/2019 FINAL  Final  Culture, blood (routine x 2)     Status: Abnormal   Collection Time: 01/02/19 11:45 AM   Specimen: BLOOD  Result Value Ref Range Status   Specimen Description   Final    BLOOD LEFT HAND Performed at [facility] [address] [place], [place] [address]    Special Requests   Final    BOTTLES DRAWN AEROBIC ONLY Blood Culture adequate volume Performed at [facility] [address], [place], [place] [address]    Culture  Setup Time   Final    GRAM POSITIVE COCCI AEROBIC BOTTLE ONLY CRITICAL RESULT CALLED TO, READ BACK BY AND VERIFIED WITH: Jene Every PharmD 9:40 01/03/19 ([email or web address]) Performed at [facility], [place] [address] [place], [place] [address]    Culture STREPTOCOCCUS PNEUMONIAE (A)  Final   Report Status 01/05/2019 FINAL  Final   Organism ID, Bacteria STREPTOCOCCUS PNEUMONIAE  Final      Susceptibility   Streptococcus pneumoniae - MIC*    ERYTHROMYCIN >=8 RESISTANT Resistant     LEVOFLOXACIN 1 SENSITIVE Sensitive     VANCOMYCIN 0.5 SENSITIVE Sensitive     PENICILLIN (non-meningitis) <=0.06 SENSITIVE Sensitive     CEFTRIAXONE  (non-meningitis) <=0.12 SENSITIVE Sensitive     * STREPTOCOCCUS PNEUMONIAE  Blood Culture ID Panel (Reflexed)     Status: Abnormal   Collection Time:  01/02/19 11:45 AM  Result Value Ref Range Status   Enterococcus species NOT DETECTED NOT DETECTED Final   Listeria monocytogenes NOT DETECTED NOT DETECTED Final   Staphylococcus species NOT DETECTED NOT DETECTED Final   Staphylococcus aureus (BCID) NOT DETECTED NOT DETECTED Final   Streptococcus species DETECTED (A) NOT DETECTED Final    Comment: CRITICAL RESULT CALLED TO, READ BACK BY AND VERIFIED WITH: Jene Every PharmD 9:40 01/03/19 ([email or web address])    Streptococcus agalactiae NOT DETECTED NOT DETECTED Final   Streptococcus pneumoniae DETECTED (A) NOT DETECTED Final    Comment: CRITICAL RESULT CALLED TO, READ BACK BY AND VERIFIED WITH: Jene Every PharmD 9:40 01/03/19 ([email or web address])    Streptococcus pyogenes NOT DETECTED NOT DETECTED Final   Acinetobacter baumannii NOT DETECTED NOT DETECTED Final   Enterobacteriaceae species NOT DETECTED NOT DETECTED Final   Enterobacter cloacae complex NOT DETECTED NOT DETECTED Final   Escherichia coli NOT DETECTED NOT DETECTED Final   Klebsiella oxytoca NOT DETECTED NOT DETECTED Final   Klebsiella pneumoniae NOT DETECTED NOT DETECTED Final   Proteus species NOT DETECTED NOT DETECTED Final   Serratia marcescens NOT DETECTED NOT DETECTED Final   Haemophilus influenzae NOT DETECTED NOT DETECTED Final   Neisseria meningitidis NOT DETECTED NOT DETECTED Final   Pseudomonas aeruginosa NOT DETECTED NOT DETECTED Final   Candida albicans NOT DETECTED NOT DETECTED Final   Candida glabrata NOT DETECTED NOT DETECTED Final   Candida krusei NOT DETECTED NOT DETECTED Final   Candida parapsilosis NOT DETECTED NOT DETECTED Final   Candida tropicalis NOT DETECTED NOT DETECTED Final    Comment: Performed at [facility], [place] [address] [place], [place] [address]  Culture, blood (routine x 2)     Status: None (Preliminary result)    Collection Time: 01/05/19  4:30 PM   Specimen: BLOOD  Result Value Ref Range Status   Specimen Description   Final    BLOOD LEFT ARM Performed at [facility] [address], [place], [place] [address]    Special Requests   Final    BOTTLES DRAWN AEROBIC AND ANAEROBIC Blood Culture adequate volume Performed at [facility] [address] [place], [place] [address]    Culture   Final    NO GROWTH < 24 HOURS Performed at [facility] [address], [place], [place] [address]    Report Status PENDING  Incomplete  Culture, blood (routine x 2)     Status: None (Preliminary result)   Collection Time: 01/05/19  4:38 PM   Specimen: BLOOD  Result Value Ref Range Status   Specimen Description   Final    BLOOD LEFT HAND Performed at [facility] [address], [place], [place] [address]    Special Requests   Final    BOTTLES DRAWN AEROBIC AND ANAEROBIC Blood Culture adequate volume Performed at [facility] [address], [place],  [address]    Culture   Final    NO GROWTH < 24 HOURS Performed at [facility] [address] [place],  [address]    Report Status PENDING  Incomplete      Radiology Studies: Ct Chest Wo Contrast  Result Date: 01/05/2019 CLINICAL DATA:  Hospitalization for COVID-19/respiratory failure, hospitalization complicated by superimposed Streptococcus pneumonia and bacteremia EXAM: CT CHEST WITHOUT CONTRAST TECHNIQUE: Multidetector CT imaging of the chest was performed following the standard protocol without IV contrast. COMPARISON:  Numerous prior chest radiographs, most recently  December 18, 2018 FINDINGS: Cardiovascular: Coronary calcifications and calcification of the aortic valve leaflets. Cardiac size is normal. Mild central pulmonary arterial enlargement. Mediastinum/Nodes: Few reactive appearing low-attenuation mediastinal and hilar nodes. Thyroid gland,  trachea and esophagus demonstrate no significant abnormality. Lungs/Pleura: Diffuse airways thickening and scattered secretions. There are scattered peripheral areas of ground-glass opacity throughout both lungs. More focal consolidation is present in the right middle and lower lobe and posterior basal segment of the left lower lobe. Bronchiectatic changes are present of the right lower lobe airways. Superimposed areas of volume loss in both lung bases. Upper Abdomen: Mild nonspecific stranding seen along the superior aspect of the right kidney. Musculoskeletal: Flowing anterior vertebral body osteophytosis compatible with diffuse idiopathic skeletal hyperostosis. No acute osseous abnormality or suspicious osseous lesion. 01 soft tissues of the chest wall are unremarkable. IMPRESSION: Findings compatible with patient's history of viral pneumonia with bacterial superinfection. Airspace disease most pronounced in the right middle and lower lobes. Likely developing architectural distortion and scarring with bronchiectatic changes the right lower lobe airways. Reactive mediastinal and hilar adenopathy. Central pulmonary arterial enlargement may reflect pulmonary arterial hypertension. Osseous features compatible with diffuse idiopathic skeletal hyperostosis. Mild nonspecific right perinephric stranding, incompletely assessed on this exam. Correlate with renal function. Aortic Atherosclerosis (ICD10-I70.0). Electronically Signed   By: MD Lovena Le   On: 01/05/2019 18:16    Scheduled Meds: . aspirin  81 mg Oral Daily  . atorvastatin  20 mg Oral Daily  . busPIRone  15 mg Oral TID  . Chlorhexidine Gluconate Cloth  6 each Topical Daily  . cholecalciferol  5,000 Units Oral Daily  . clonazepam  1 mg Oral BID  . divalproex  250 mg Oral Q12H  . enoxaparin (LOVENOX) injection  40 mg Subcutaneous Q12H  . feeding supplement (ENSURE ENLIVE)  237 mL Oral TID BM  . fluconazole  100 mg Oral Daily  . fluticasone  2  spray Each Nare Daily  . gabapentin  300 mg Oral 2 times per day   And  . gabapentin  600 mg Oral QHS  . insulin aspart  0-9 Units Subcutaneous TID WC  . insulin glargine  10 Units Subcutaneous Daily  . latanoprost  1 drop Both Eyes QHS  . mouth rinse  15 mL Mouth Rinse BID  . multivitamin with minerals  1 tablet Oral Daily  . pantoprazole  40 mg Oral Daily  . polyethylene glycol  17 g Oral Daily  . saccharomyces boulardii  250 mg Oral BID  . tamsulosin  0.4 mg Oral QPC breakfast  . venlafaxine XR  75 mg Oral Q breakfast   Continuous Infusions: . sodium chloride 999 mL/hr at 12/31/18 1027  . sodium chloride Stopped (01/04/19 1630)  . cefTRIAXone (ROCEPHIN)  IV 2 g (01/06/19 1717)     LOS: 20 days   Time spent: 25 minutes.  Patrecia Pour, MD [facility] [email or web address] Password [facility] 01/07/2019, 10:28 AM

## 2019-01-07 NOTE — Progress Notes (Addendum)
Occupational Therapy Treatment Patient Details Name: Joel Greene MRN: [id number] DOB: [birth year] Today's Date: 01/07/2019    History of present illness Pt is a 68 y.o. male admitted 12/18/18 with fever, chills and SOB; became progressively hypoxic and tachypnic, tested postiive for COVID-19. ETT 6/20-6/22. PMH includes OSA, IBS, DJD, glaucoma.   OT comments  On entry pt with bottom on footrest trying to unsafely slide self into bed. Pt repositioned with Max A of 2 to prevent fall. Mod A to transfer into chair using RW and mod A. Attempted to engage in LB bathing howevr, pt unable at this time due to respiratory status Pt with increased anxiety and rapid shallow breaths with RR 55; SpO2 77 and HR 126 on 6L. Nelcor device used in conjunction with brown finger probe with good correlation between SpO2. Over 15 min to rebound to 89/90. Used music/Temptations to decrease anxiety and distract pt from focusing on SOB. After anxiety calmed pt enjoyed conversations about music and supper clubs. Continue to recommend SNF for DC. Will continue to follow acutely. Pt appreciative.   Follow Up Recommendations  SNF;Supervision/Assistance - 24 hour    Equipment Recommendations  3 in 1 bedside commode    Recommendations for Other Services      Precautions / Restrictions Precautions Precautions: Fall Precaution Comments: check O2 sats with nellcor forehead probe and monitor       Mobility Bed Mobility Overal bed mobility: Needs Assistance Bed Mobility: Sit to Supine     Supine to sit: Min assist;HOB elevated Sit to supine: Min assist   General bed mobility comments: Min hand held assist to pull to sitting x2, first time he immediately became tachypnic and returned to supine.  O2 sats reading on forehead finger probe said 90s despite pt's distressed presentation, so PT went and got a nellcor forehead probe and nellcor monitor which showed O2 sats on 5 L HFNC at 80, RR 53, HR 124 and turned him up to  6L HFNC with >10 min recovery to 88-90% on 6L HFNC.  Compared multiple locations (earlobe, finger nellcor, and tan finger probe) and tan finger probe was most accurate compared to nellcor forehead probe, so that was left on for continuous monitoring.    Transfers Overall transfer level: Needs assistance Equipment used: Rolling walker (2 wheeled) Transfers: Squat Pivot Transfers   Stand pivot transfers: Mod assist Squat pivot transfers: Mod assist     General transfer comment: Impulsive and anxious limited ability     Balance Overall balance assessment: Needs assistance Sitting-balance support: Feet supported;Bilateral upper extremity supported Sitting balance-Leahy Scale: Fair Sitting balance - Comments: supervision EOB, however, cannot tolerate EOB long due to increased RR decreased sats, and distress.                                   ADL either performed or assessed with clinical judgement   ADL Overall ADL's : Needs assistance/impaired     Grooming: Set up;Sitting                               Functional mobility during ADLs: Moderate assistance;Rolling walker General ADL Comments: impulsive and unsafe during trasnfer.      Vision       Perception     Praxis      Cognition Arousal/Alertness: Awake/alert Behavior During Therapy: Anxious Overall Cognitive Status: Impaired/Different  from baseline                     Current Attention Level: Selective Memory: Decreased short-term memory   Safety/Judgement: Decreased awareness of safety;Decreased awareness of deficits Awareness: Emergent Problem Solving: Slow processing General Comments: Not specifically tested, pt's conversation is normal and he self admitts to anxiety        Exercises Exercises: Other exercises Other Exercises Other Exercises: Pt demonstrating in bed self exercises including leg lifts and heel slides.   Shoulder Instructions       General Comments  Music used to help with anxiety; Pt talking about Temptations and [place] in [employer] doing [place] at end of sesison    Pertinent Vitals/ Pain       Pain Assessment: Faces Faces Pain Scale: Hurts little more Pain Location: chest with breathing Pain Descriptors / Indicators: Discomfort Pain Intervention(s): Limited activity within patient's tolerance  Home Living                                          Prior Functioning/Environment              Frequency  Min 2X/week        Progress Toward Goals  OT Goals(current goals can now be found in the care plan section)  Progress towards OT goals: Goals drowngraded-see care plan  Acute Rehab OT Goals Patient Stated Goal: to breath better OT Goal Formulation: With patient Time For Goal Achievement: 01/20/19 Potential to Achieve Goals: Good ADL Goals Pt Will Perform Lower Body Bathing: with min assist Pt Will Perform Lower Body Dressing: with min assist Pt Will Transfer to Toilet: with min assist;bedside commode Pt Will Perform Toileting - Clothing Manipulation and hygiene: with min assist;sit to/from stand Pt Will Perform Tub/Shower Transfer: with min assist;ambulating Pt/caregiver will Perform Home Exercise Program: Both right and left upper extremity;With theraband;With written HEP provided;With Supervision  Plan Discharge plan remains appropriate    Co-evaluation                 AM-PAC OT "6 Clicks" Daily Activity     Outcome Measure   Help from another person eating meals?: None Help from another person taking care of personal grooming?: A Little Help from another person toileting, which includes using toliet, bedpan, or urinal?: A Lot Help from another person bathing (including washing, rinsing, drying)?: A Lot Help from another person to put on and taking off regular upper body clothing?: A Little Help from another person to put on and taking off regular lower  body clothing?: A Lot 6 Click Score: 16    End of Session Equipment Utilized During Treatment: Oxygen(6L)  OT Visit Diagnosis: Unsteadiness on feet (R26.81);Muscle weakness (generalized) (M62.81);Pain Pain - part of body: (chest)   Activity Tolerance Patient tolerated treatment well   Patient Left in bed;with call bell/phone within reach;with bed alarm set   Nurse Communication Mobility status;Other (comment)(o2 sats)        Time: 0786-7544 OT Time Calculation (min): 30 min  Charges: 2 self care 23-37 min   Taeshaun Rames,HILLARY 01/07/2019, 4:22 PM  Maurie Boettcher, OT/L   Acute OT Clinical Specialist Acute Rehabilitation Services Pager [phone] Office [phone]

## 2019-01-07 NOTE — Progress Notes (Signed)
Nutrition Follow-up RD working remotely.  DOCUMENTATION CODES:   Not applicable  INTERVENTION:   Continue Ensure Enlive po TID, each supplement provides 350 kcal and 20 grams of protein  Continue Hormel Shake daily with Breakfast which provides 520 kcals and 22 g of protein and Magic cup BID with lunch and dinner, each supplement provides 290 kcal and 9 grams of protein, automatically on meal trays to optimize nutritional intake.   NUTRITION DIAGNOSIS:   Inadequate oral intake related to acute illness as evidenced by per patient/family report.  Ongoing   GOAL:   Patient will meet greater than or equal to 90% of their needs  Progressing   MONITOR:   PO intake, Supplement acceptance, Labs  REASON FOR ASSESSMENT:   Consult Enteral/tube feeding initiation and management  ASSESSMENT:   68 yo male admitted with COVID-19 related acute respiratory failure requiring intubation at [facility] and transferred to [facility].  PMH includes bronchitis, IBS.  Patient with oral candidiasis with mouth sores affecting intake. Mouth sores have improved. Intake is somewhat better, but remains poor. Only consuming 25-50% of meals. Receiving magic mouthwash TID. Drinking Ensure Enlive supplement TID between meals.   Labs reviewed.  CBG's: 123-199  Medications reviewed and include vitamin D, novolog, lantus, MVI, Florastor, Flomax.   Diet Order:   Diet Order            Diet Carb Modified Fluid consistency: Thin; Room service appropriate? Yes  Diet effective now              EDUCATION NEEDS:   Not appropriate for education at this time  Skin:  Skin Assessment: Reviewed RN Assessment(MASD to anus, scrotum)  Last BM:  7/9  Height:   Ht Readings from Last 1 Encounters:  12/19/18 5\' 7"  (1.702 m)    Weight:   Wt Readings from Last 1 Encounters:  01/07/19 73.2 kg    Ideal Body Weight:  67.3 kg  BMI:  Body mass index is 25.28 kg/m.  Estimated Nutritional Needs:   Kcal:   1900-2100 kcals  Protein:  95-105 g  Fluid:  >/= 1.9 L    Molli Barrows, RD, LDN, [place] Pager [phone] After Hours Pager [phone]

## 2019-01-07 NOTE — Progress Notes (Signed)
Physical Therapy Treatment Patient Details Name: Joel Greene MRN: [id number] DOB: [birth year] Today's Date: 01/07/2019    History of Present Illness Pt is a 68 y.o. male admitted 12/18/18 with fever, chills and SOB; became progressively hypoxic and tachypnic, tested postiive for COVID-19. ETT 6/20-6/22. PMH includes OSA, IBS, DJD, glaucoma.    PT Comments    Pt was able to get OOB to the recliner chair with RR 53, O2 sat 80 on 5-6L HFNC and ~10 min recovery period to return to normal breathing and O2 sat 88-90 on 6 L HFNC.  He is still unable to tolerate ambulating due to distress with mobility.  I used the nellcor forehead probe to monitor sats throughout and we found that the tan finger monitor most closely matched the nellcor forehead probe (RN and MD made aware).  Finger probe removed from forehead as it was falsely elevating his O2 sats.  PT will continue to follow acutely for safe mobility progression.  Pt remains appropriate for SNF level rehab at discharge.   Follow Up Recommendations  SNF(would like [facility] in [place] if he can)     Equipment Recommendations  Other (comment)(oxygen)    Recommendations for Other Services   NA     Precautions / Restrictions Precautions Precautions: Fall;Other (comment) Precaution Comments: check O2 sats with nellcor forehead probe and monitor    Mobility  Bed Mobility Overal bed mobility: Needs Assistance Bed Mobility: Supine to Sit     Supine to sit: Min assist;HOB elevated     General bed mobility comments: Min hand held assist to pull to sitting x2, first time he immediately became tachypnic and returned to supine.  O2 sats reading on forehead finger probe said 90s despite pt's distressed presentation, so PT went and got a nellcor forehead probe and nellcor monitor which showed O2 sats on 5 L HFNC at 80, RR 53, HR 124 and turned him up to 6L HFNC with >10 min recovery to 88-90% on 6L HFNC.  Compared multiple locations (earlobe,  finger nellcor, and tan finger probe) and tan finger probe was most accurate compared to nellcor forehead probe, so that was left on for continuous monitoring.    Transfers Overall transfer level: Needs assistance Equipment used: None Transfers: Squat Pivot Transfers     Squat pivot transfers: Mod assist     General transfer comment: Mod assist to quickly squat pivot over from bed to chair to limit EOB time and decrease pt's anxiety.    Ambulation/Gait             General Gait Details: Unable to yet due to increased RR when mobilizing and increased feeling of panic reported by pt.       Balance Overall balance assessment: Needs assistance Sitting-balance support: Feet supported;Bilateral upper extremity supported Sitting balance-Leahy Scale: Fair Sitting balance - Comments: supervision EOB, however, cannot tolerate EOB long due to increased RR decreased sats, and distress.                                    Cognition Arousal/Alertness: Awake/alert Behavior During Therapy: Anxious                                   General Comments: Not specifically tested, pt's conversation is normal and he self admitts to anxiety  Exercises Other Exercises Other Exercises: Pt demonstrating in bed self exercises including leg lifts and heel slides.    General Comments        Pertinent Vitals/Pain Pain Assessment: No/denies pain Faces Pain Scale: No hurt           PT Goals (current goals can now be found in the care plan section) Acute Rehab PT Goals Patient Stated Goal: to breath better Progress towards PT goals: Progressing toward goals    Frequency    Min 3X/week      PT Plan Current plan remains appropriate       AM-PAC PT "6 Clicks" Mobility   Outcome Measure  Help needed turning from your back to your side while in a flat bed without using bedrails?: A Little Help needed moving from lying on your back to sitting on the  side of a flat bed without using bedrails?: A Little Help needed moving to and from a bed to a chair (including a wheelchair)?: A Lot Help needed standing up from a chair using your arms (e.g., wheelchair or bedside chair)?: A Lot Help needed to walk in hospital room?: Total Help needed climbing 3-5 steps with a railing? : Total 6 Click Score: 12    End of Session Equipment Utilized During Treatment: Oxygen(5-6 L HFNC) Activity Tolerance: Other (comment)(limited by increased RR, decreased O2 sats, resp distress) Patient left: in chair;with call bell/phone within reach;with chair alarm set Nurse Communication: Mobility status;Other (comment)(use of nellcor forehead probe) PT Visit Diagnosis: Difficulty in walking, not elsewhere classified (R26.2)     Time: 2330-0762 PT Time Calculation (min) (ACUTE ONLY): 50 min  Charges:  $Therapeutic Activity: 38-52 mins                    Guiliana Shor B. Yashas Camilli, PT, DPT  Acute Rehabilitation [phone] pager [phone] office  @ [facility]: [phone]   01/07/2019, 12:46 PM

## 2019-01-08 LAB — GLUCOSE, CAPILLARY
Glucose-Capillary: 98 mg/dL (ref 70–99)
Glucose-Capillary: 231 mg/dL — ABNORMAL HIGH (ref 70–99)
Glucose-Capillary: 158 mg/dL — ABNORMAL HIGH (ref 70–99)

## 2019-01-08 NOTE — Progress Notes (Signed)
PROGRESS NOTE  Joel Greene  HYQ:[id number] DOB: Aug 28, 1950 DOA: 12/18/2018 PCP: Redmond School, MD   Brief Narrative: Patient is a 68 y.o. male with PMHx of CAD, depression with anxiety, chronic pain-presented with fever, shortness of breath-he unfortunately became progressively hypoxic and was intubated and transferred to [facility].  Treated with steroids, Remdesivir, Actemra and convalescent plasma.  Subsequently extubated on 6/22.  Hospital course has then been complicated by extreme anxiety and lately by superimposed Streptococcus pneumoniae pneumonia and bacteremia.   Assessment & Plan: Active Problems:   Acute respiratory failure with hypoxia (HCC)   Sepsis due to undetermined organism ([place])   Pneumonia due to COVID-19 virus   Acute respiratory failure with hypoxemia (HCC)   Hypokalemia   Encounter for intubation   History of ETT  Acute hypoxic respiratory failure due to covid-19 pneumonia: Received remdesivir, tocilizumab and convalescent plasma.  - Continue supplemental oxygen as needed to maintain normal respiratory effort. - Continue airborne, contact precautions. PPE including surgical gown, gloves, cap, shoe covers, and CAPR used during this encounter in a negative pressure room.  - Inflammatory markers, once normalized, now returning to downward trend following Tx Pneumococcus. Stopped steroids as he had received >2 weeks and need to clear bacteremia.  - Maintain euvolemia/net negative.  - Avoid NSAIDs - Recommend aggressive use of incentive spirometry. - Discussed with patient that he should expect a protracted recovery from these two life-threatening infections.  Sepsis due to Pneumococcal pneumonia and bacteremia: Sepsis has resolved. WBC normal, afebrile. CT showed RML, RLL infiltrates without complication (e.g. cavitation, empyema).  - Continue ceftriaxone 2g IV q24h, susceptibility documented on initial cultures. Started cefepime 7/5, transitioned to CTX  7/6. - Discussed with Dr. Prince Rome, ID on 7/8. Will require 14 days IV therapy.   - Monitor blood cultures. If these remain negative, insert PICC and proceed with 14 days IV Tx.   Right perinephric stranding: Incidentally noted on CT chest 7/8. No dysuria noted, on CTX regardless.   Oral candidiasis: Mouth sores dramatically improved - Magic mouthwash TID scheduled - Continue diflucan x7 days  Anxiety: Severe, required precedex followed by benzodiazepine taper. Improving. - Continue xanax.  - Continue buspar, clonazepam, depakote, and effexor.   Acute on chronic HFpEF: Improved volume status.  - Continue to monitor I/O, daily weights. Weight roughly stabilized.  HTN:  - Monitor, prn hydralazine  BPH:  - Continue flomax, monitor UOP  OSA: Unable to use CPAP currently.   T2DM with steroid-induced hyperglycemia: HbA1c 6.5%. At inpatient goal. - Continue lantus 10u + SSI, remains well-controlled.  LFT elevation: ALT stable at 167.  - Monitor intermittently.  Thrombocytopenia: Mild, trending upward, not consistent with HITT.  - Continue to monitor intermittently.  DVT prophylaxis: Lovenox Code Status: DNR Family Communication: None at bedside. Disposition Plan: Uncertain. Will require 14 days IV antibiotics. Current recommendation is SNF, CSW consulted, as he remains very deconditioned.  Consultants:   PCCM  Procedures:   ETT 6/20 - 6/22  RUE PICC 6/21 6/27 - 7/7 returned to ICU due to progressive hypoxia, required precedex for severe anxiety.   Antimicrobials: 6/20 zosyn x1 6/20 vanc x1 6/21 azithro > 6/23 6/21 ceftriaxone > 6/23 6/20 remdesivir >  6/26 actemra x1 6/26 Convalescent plasma  Subjective: Feels like he's having a set back today, more short of breath since working with therapy yesterday. Had severe dyspnea just to recliner, requiring significant assistance. No fevers. No chest pain. Mouth/tongue sores improving.  Objective: Vitals:   01/08/19  0100  01/08/19 0500 01/08/19 0732 01/08/19 1130  BP: 120/79 120/77 125/80   Pulse: (!) 101 95    Resp: (!) 26 (!) 22 (!) 23   Temp: 97.7 F (36.5 C) 98.1 F (36.7 C) 97.9 F (36.6 C) 98.4 F (36.9 C)  TempSrc: Oral Oral Axillary Oral  SpO2: 92% 97% 91%   Weight:  73.8 kg    Height:        Intake/Output Summary (Last 24 hours) at 01/08/2019 1134 Last data filed at 01/08/2019 1100 Gross per 24 hour  Intake -  Output 925 ml  Net -925 ml   Filed Weights   01/06/19 0500 01/07/19 0500 01/08/19 0500  Weight: 75 kg 73.2 kg 73.8 kg   Gen: 68 y.o. male in no distress Pulm: Nonlabored but tachypneic and into 80%'s at rest. Crackles R > L remain but improving from prior exams. CV: Regular rate and rhythm. No murmur, rub, or gallop. No JVD, no dependent edema. GI: Abdomen soft, non-tender, non-distended, with normoactive bowel sounds.  Ext: Warm, no deformities, decreased muscle bulk Skin: No rashes, lesions or ulcers on visualized skin. Neuro: Alert and oriented. No focal neurological deficits. Psych: Judgement and insight appear fair. Mood euthymic & affect congruent. Behavior is appropriate.    Data Reviewed: I have personally reviewed following labs and imaging studies  CBC: Recent Labs  Lab 01/03/19 0500 01/04/19 0442 01/05/19 0415 01/06/19 0500 01/07/19 0500  WBC 3.1* 4.8 4.3 4.0 3.6*  HGB 12.1* 11.5* 11.6* 12.0* 14.3  HCT 35.8* 34.0* 35.2* 36.9* 43.8  MCV 98.9 96.9 97.8 98.9 98.4  PLT PLATELET CLUMPS NOTED ON SMEAR, UNABLE TO ESTIMATE 134* 132* 132* 062*   Basic Metabolic Panel: Recent Labs  Lab 01/03/19 0500 01/04/19 0442 01/05/19 0415 01/06/19 0500 01/07/19 0500  NA 137 138 138 138 139  K 4.0 3.9 3.7 4.0 3.8  CL 108 107 104 97* 98  CO2 22 22 26 28 26   GLUCOSE 235* 217* 180* 164* 129*  BUN 24* 23 22 21 19   CREATININE 0.70 0.64 0.62 0.61 0.61  CALCIUM 7.4* 8.2* 8.7* 8.6* 9.1  MG 2.5*  --   --   --   --    GFR: Estimated Creatinine Clearance: 81.5 mL/min (by C-G  formula based on SCr of 0.61 mg/dL). Liver Function Tests: Recent Labs  Lab 01/03/19 0500 01/04/19 0442 01/05/19 0415 01/06/19 0500 01/07/19 0500  AST 32 43* 74* 40 44*  ALT 71* 79* 155* 167* 167*  ALKPHOS 76 79 99 97 112  BILITOT 0.6 0.5 0.6 0.3 0.2*  PROT 4.4* 4.6* 4.8* 4.8* 5.6*  ALBUMIN 2.0* 2.1* 2.4* 2.5* 3.0*   No results for input(s): LIPASE, AMYLASE in the last 168 hours. No results for input(s): AMMONIA in the last 168 hours. Coagulation Profile: No results for input(s): INR, PROTIME in the last 168 hours. Cardiac Enzymes: No results for input(s): CKTOTAL, CKMB, CKMBINDEX, TROPONINI in the last 168 hours. BNP (last 3 results) No results for input(s): PROBNP in the last 8760 hours. HbA1C: No results for input(s): HGBA1C in the last 72 hours. CBG: Recent Labs  Lab 01/07/19 0800 01/07/19 1211 01/07/19 1627 01/07/19 2041 01/08/19 0729  GLUCAP 123* 199* 169* 175* 98   Lipid Profile: No results for input(s): CHOL, HDL, LDLCALC, TRIG, CHOLHDL, LDLDIRECT in the last 72 hours. Thyroid Function Tests: No results for input(s): TSH, T4TOTAL, FREET4, T3FREE, THYROIDAB in the last 72 hours. Anemia Panel: Recent Labs    01/06/19 0500  FERRITIN  753*   Urine analysis:    Component Value Date/Time   COLORURINE YELLOW 12/18/2018 1527   APPEARANCEUR HAZY (A) 12/18/2018 1527   LABSPEC 1.019 12/18/2018 1527   PHURINE 6.0 12/18/2018 1527   GLUCOSEU NEGATIVE 12/18/2018 1527   HGBUR NEGATIVE 12/18/2018 1527   BILIRUBINUR NEGATIVE 12/18/2018 1527   KETONESUR NEGATIVE 12/18/2018 1527   PROTEINUR 30 (A) 12/18/2018 1527   UROBILINOGEN 0.2 02/06/2015 1353   NITRITE NEGATIVE 12/18/2018 1527   LEUKOCYTESUR NEGATIVE 12/18/2018 1527   Recent Results (from the past 240 hour(s))  Culture, blood (routine x 2)     Status: Abnormal   Collection Time: 01/02/19 11:40 AM   Specimen: BLOOD  Result Value Ref Range Status   Specimen Description   Final    BLOOD LEFT ANTECUBITAL  Performed at [facility] [address], [place], [place] [address]    Special Requests   Final    BOTTLES DRAWN AEROBIC ONLY Blood Culture adequate volume Performed at [facility] [address], [place], [place] [address]    Culture  Setup Time   Final    GRAM POSITIVE COCCI AEROBIC BOTTLE ONLY CRITICAL VALUE NOTED.  VALUE IS CONSISTENT WITH PREVIOUSLY REPORTED AND CALLED VALUE.    Culture (A)  Final    STREPTOCOCCUS PNEUMONIAE SUSCEPTIBILITIES PERFORMED ON PREVIOUS CULTURE WITHIN THE LAST 5 DAYS. Performed at [facility], [place] [address], [place], [place] [address]    Report Status 01/05/2019 FINAL  Final  Culture, blood (routine x 2)     Status: Abnormal   Collection Time: 01/02/19 11:45 AM   Specimen: BLOOD  Result Value Ref Range Status   Specimen Description   Final    BLOOD LEFT HAND Performed at [facility] [address] [place], [place] [address]    Special Requests   Final    BOTTLES DRAWN AEROBIC ONLY Blood Culture adequate volume Performed at [facility] [address], [place], [place] [address]    Culture  Setup Time   Final    GRAM POSITIVE COCCI AEROBIC BOTTLE ONLY CRITICAL RESULT CALLED TO, READ BACK BY AND VERIFIED WITH: Jene Every PharmD 9:40 01/03/19 ([email or web address]) Performed at [facility], [place] [address], [place], [place] [address]    Culture STREPTOCOCCUS PNEUMONIAE (A)  Final   Report Status 01/05/2019 FINAL  Final   Organism ID, Bacteria STREPTOCOCCUS PNEUMONIAE  Final      Susceptibility   Streptococcus pneumoniae - MIC*    ERYTHROMYCIN >=8 RESISTANT Resistant     LEVOFLOXACIN 1 SENSITIVE Sensitive     VANCOMYCIN 0.5 SENSITIVE Sensitive     PENICILLIN (non-meningitis) <=0.06 SENSITIVE Sensitive     CEFTRIAXONE (non-meningitis) <=0.12 SENSITIVE Sensitive     * STREPTOCOCCUS PNEUMONIAE  Blood Culture ID Panel (Reflexed)     Status: Abnormal   Collection  Time: 01/02/19 11:45 AM  Result Value Ref Range Status   Enterococcus species NOT DETECTED NOT DETECTED Final   Listeria monocytogenes NOT DETECTED NOT DETECTED Final   Staphylococcus species NOT DETECTED NOT DETECTED Final   Staphylococcus aureus (BCID) NOT DETECTED NOT DETECTED Final   Streptococcus species DETECTED (A) NOT DETECTED Final    Comment: CRITICAL RESULT CALLED TO, READ BACK BY AND VERIFIED WITH: Jene Every PharmD 9:40 01/03/19 ([email or web address])    Streptococcus agalactiae NOT DETECTED NOT DETECTED Final   Streptococcus pneumoniae DETECTED (A) NOT DETECTED Final    Comment: CRITICAL RESULT CALLED TO, READ BACK  BY AND VERIFIED WITH: Jene Every PharmD 9:40 01/03/19 ([email or web address])    Streptococcus pyogenes NOT DETECTED NOT DETECTED Final   Acinetobacter baumannii NOT DETECTED NOT DETECTED Final   Enterobacteriaceae species NOT DETECTED NOT DETECTED Final   Enterobacter cloacae complex NOT DETECTED NOT DETECTED Final   Escherichia coli NOT DETECTED NOT DETECTED Final   Klebsiella oxytoca NOT DETECTED NOT DETECTED Final   Klebsiella pneumoniae NOT DETECTED NOT DETECTED Final   Proteus species NOT DETECTED NOT DETECTED Final   Serratia marcescens NOT DETECTED NOT DETECTED Final   Haemophilus influenzae NOT DETECTED NOT DETECTED Final   Neisseria meningitidis NOT DETECTED NOT DETECTED Final   Pseudomonas aeruginosa NOT DETECTED NOT DETECTED Final   Candida albicans NOT DETECTED NOT DETECTED Final   Candida glabrata NOT DETECTED NOT DETECTED Final   Candida krusei NOT DETECTED NOT DETECTED Final   Candida parapsilosis NOT DETECTED NOT DETECTED Final   Candida tropicalis NOT DETECTED NOT DETECTED Final    Comment: Performed at [facility], [place]. [address], [place], [place] [address]  Culture, blood (routine x 2)     Status: None (Preliminary result)   Collection Time: 01/05/19  4:30 PM   Specimen: BLOOD  Result Value Ref Range Status   Specimen Description   Final    BLOOD LEFT ARM  Performed at [facility] [address] [place], [place] [address]    Special Requests   Final    BOTTLES DRAWN AEROBIC AND ANAEROBIC Blood Culture adequate volume Performed at [facility] [address], [place], [place] [address]    Culture   Final    NO GROWTH 2 DAYS Performed at [facility] [address] [place], [place] [address]    Report Status PENDING  Incomplete  Culture, blood (routine x 2)     Status: None (Preliminary result)   Collection Time: 01/05/19  4:38 PM   Specimen: BLOOD  Result Value Ref Range Status   Specimen Description   Final    BLOOD LEFT HAND Performed at [facility] [address], [place], [place] [address]    Special Requests   Final    BOTTLES DRAWN AEROBIC AND ANAEROBIC Blood Culture adequate volume Performed at [facility] [address] [place], [place] [address]    Culture   Final    NO GROWTH 2 DAYS Performed at [facility] [address] [place], [place] [address]    Report Status PENDING  Incomplete      Radiology Studies: No results found.  Scheduled Meds: . aspirin  81 mg Oral Daily  . atorvastatin  20 mg Oral Daily  . busPIRone  15 mg Oral TID  . cholecalciferol  5,000 Units Oral Daily  . clonazepam  1 mg Oral BID  . divalproex  250 mg Oral Q12H  . enoxaparin (LOVENOX) injection  40 mg Subcutaneous Q12H  . feeding supplement (ENSURE ENLIVE)  237 mL Oral TID BM  . fluconazole  100 mg Oral Daily  . fluticasone  2 spray Each Nare Daily  . gabapentin  300 mg Oral 2 times per day   And  . gabapentin  600 mg Oral QHS  . insulin aspart  0-9 Units Subcutaneous TID WC  . insulin glargine  10 Units Subcutaneous Daily  . latanoprost  1 drop Both Eyes QHS  . magic mouthwash w/lidocaine  15 mL Oral TID  . mouth rinse  15  mL Mouth Rinse BID  . multivitamin with minerals  1 tablet Oral Daily  . pantoprazole  40 mg Oral Daily  .  polyethylene glycol  17 g Oral Daily  . saccharomyces boulardii  250 mg Oral BID  . tamsulosin  0.4 mg Oral QPC breakfast  . venlafaxine XR  75 mg Oral Q breakfast   Continuous Infusions: . sodium chloride 999 mL/hr at 12/31/18 1027  . sodium chloride Stopped (01/04/19 1630)  . cefTRIAXone (ROCEPHIN)  IV 2 g (01/07/19 1642)     LOS: 21 days   Time spent: 25 minutes.  Patrecia Pour, MD [facility] [email or web address] Password [facility] 01/08/2019, 11:34 AM

## 2019-01-09 ENCOUNTER — Inpatient Hospital Stay: Payer: Self-pay

## 2019-01-09 LAB — GLUCOSE, CAPILLARY
Glucose-Capillary: 96 mg/dL (ref 70–99)
Glucose-Capillary: 211 mg/dL — ABNORMAL HIGH (ref 70–99)
Glucose-Capillary: 210 mg/dL — ABNORMAL HIGH (ref 70–99)
Glucose-Capillary: 147 mg/dL — ABNORMAL HIGH (ref 70–99)
Glucose-Capillary: 174 mg/dL — ABNORMAL HIGH (ref 70–99)

## 2019-01-09 NOTE — Progress Notes (Signed)
Patient deferred family update phone call. 

## 2019-01-09 NOTE — Progress Notes (Signed)
Patient deferred family update. 

## 2019-01-09 NOTE — Progress Notes (Signed)
PROGRESS NOTE  Joel Greene  HDQ:[id number] DOB: 1951-06-28 DOA: 12/18/2018 PCP: Redmond School, MD   Brief Narrative: Patient is a 68 y.o. male with PMHx of CAD, depression with anxiety, chronic pain-presented with fever, shortness of breath-he unfortunately became progressively hypoxic and was intubated and transferred to [facility].  Treated with steroids, Remdesivir, Actemra and convalescent plasma.  Subsequently extubated on 6/22.  Hospital course has then been complicated by extreme anxiety and lately by superimposed Streptococcus pneumoniae pneumonia and bacteremia.   Assessment & Plan: Active Problems:   Acute respiratory failure with hypoxia (HCC)   Sepsis due to undetermined organism ([place])   Pneumonia due to COVID-19 virus   Acute respiratory failure with hypoxemia (HCC)   Hypokalemia   Encounter for intubation   History of ETT  Acute hypoxic respiratory failure due to covid-19 pneumonia: Received remdesivir, tocilizumab and convalescent plasma.  - Continue supplemental oxygen. Anticipate protracted recovery.  - Continue airborne, contact precautions. PPE including surgical gown, gloves, cap, shoe covers, and CAPR used during this encounter in a negative pressure room.  - Inflammatory markers, once normalized, now returning to downward trend following Tx Pneumococcus. Stopped steroids as he had received >2 weeks and need to clear bacteremia.  - Maintain euvolemia/net negative.  - Avoid NSAIDs - Recommend aggressive use of incentive spirometry. - Discussed with patient that he should expect a protracted recovery from these two life-threatening infections.  Sepsis due to Pneumococcal pneumonia and bacteremia: Sepsis has resolved. WBC normal, afebrile. CT showed RML, RLL infiltrates without complication (e.g. cavitation, empyema).  - Continue ceftriaxone 2g IV q24h, susceptibility documented on initial cultures. Started cefepime 7/5, transitioned to CTX 7/6. - Discussed  with Dr. [facility] Rome, ID on 7/8. Will require 14 days IV therapy.   - Monitor blood cultures, NGTD at >3 days, so ok to insert PICC when PICC team available.  Right perinephric stranding: Incidentally noted on CT chest 7/8. No dysuria noted, on CTX regardless.   Oral candidiasis: Mouth sores dramatically improved - Magic mouthwash TID scheduled - Continue diflucan x7 days (7/8 - 7/14)  Anxiety: Severe, required precedex followed by benzodiazepine taper. Improving. - Continue xanax.  - Continue buspar, clonazepam, depakote, and effexor.   Acute on chronic HFpEF: Improved volume status.  - Continue to monitor I/O, daily weights. Weight roughly stabilized.  HTN:  - Monitor, prn hydralazine  BPH:  - Continue flomax, monitor UOP  OSA: Unable to use CPAP currently.   T2DM with steroid-induced hyperglycemia: HbA1c 6.5%. At inpatient goal. - Continue lantus 10u + SSI, remains well-controlled.  LFT elevation: ALT stable at 167.  - Monitor intermittently.  Thrombocytopenia: Mild, trending upward, not consistent with HITT.  - Continue to monitor intermittently.  DVT prophylaxis: Lovenox Code Status: DNR Family Communication: None at bedside. Disposition Plan: Uncertain. Will require 14 days IV antibiotics. Current recommendation is SNF, CSW consulted, as he remains very deconditioned.  Consultants:   PCCM  Procedures:   ETT 6/20 - 6/22  RUE PICC 6/21 6/27 - 7/7 returned to ICU due to progressive hypoxia, required precedex for severe anxiety.   Antimicrobials: 6/20 zosyn x1 6/20 vanc x1 6/21 azithro > 6/23 6/21 ceftriaxone > 6/23 6/20 remdesivir >  6/26 actemra x1 6/26 Convalescent plasma  Subjective: Trying to sit at EOB for breakfast, feels severely dyspneic. SpO2 into 70%'s, slowly recovering over several minutes after laying back. No chest pain. No fevers. No leg swelling.  Objective: Vitals:   01/08/19 1600 01/08/19 2353 01/09/19 0412 01/09/19  0637  BP:  119/79 (!)  134/95   Pulse:  (!) 108 (!) 101   Resp:  18 (!) 22   Temp: 98.5 F (36.9 C) 99 F (37.2 C) 98.7 F (37.1 C)   TempSrc: Oral Oral Oral   SpO2:  93% 97%   Weight:    72.1 kg  Height:        Intake/Output Summary (Last 24 hours) at 01/09/2019 1100 Last data filed at 01/09/2019 0743 Gross per 24 hour  Intake 600 ml  Output 975 ml  Net -375 ml   Filed Weights   01/07/19 0500 01/08/19 0500 01/09/19 0637  Weight: 73.2 kg 73.8 kg 72.1 kg   Gen: 68 y.o. male in no distress Pulm: Labored when sitting at EOB. Crackles R > L. CV: Regular rate and rhythm. No murmur, rub, or gallop. No JVD, no dependent edema. GI: Abdomen soft, non-tender, non-distended, with normoactive bowel sounds.  Ext: Warm, no deformities, decreased muscle bulk Skin: No rashes, lesions or ulcers on visualized skin. Neuro: Alert and oriented. No focal neurological deficits. Psych: Judgement and insight appear fair. Mood euthymic & affect congruent. Behavior is appropriate.    Data Reviewed: I have personally reviewed following labs and imaging studies  CBC: Recent Labs  Lab 01/03/19 0500 01/04/19 0442 01/05/19 0415 01/06/19 0500 01/07/19 0500  WBC 3.1* 4.8 4.3 4.0 3.6*  HGB 12.1* 11.5* 11.6* 12.0* 14.3  HCT 35.8* 34.0* 35.2* 36.9* 43.8  MCV 98.9 96.9 97.8 98.9 98.4  PLT PLATELET CLUMPS NOTED ON SMEAR, UNABLE TO ESTIMATE 134* 132* 132* 962*   Basic Metabolic Panel: Recent Labs  Lab 01/03/19 0500 01/04/19 0442 01/05/19 0415 01/06/19 0500 01/07/19 0500  NA 137 138 138 138 139  K 4.0 3.9 3.7 4.0 3.8  CL 108 107 104 97* 98  CO2 22 22 26 28 26   GLUCOSE 235* 217* 180* 164* 129*  BUN 24* 23 22 21 19   CREATININE 0.70 0.64 0.62 0.61 0.61  CALCIUM 7.4* 8.2* 8.7* 8.6* 9.1  MG 2.5*  --   --   --   --    GFR: Estimated Creatinine Clearance: 81.5 mL/min (by C-G formula based on SCr of 0.61 mg/dL). Liver Function Tests: Recent Labs  Lab 01/03/19 0500 01/04/19 0442 01/05/19 0415 01/06/19 0500 01/07/19  0500  AST 32 43* 74* 40 44*  ALT 71* 79* 155* 167* 167*  ALKPHOS 76 79 99 97 112  BILITOT 0.6 0.5 0.6 0.3 0.2*  PROT 4.4* 4.6* 4.8* 4.8* 5.6*  ALBUMIN 2.0* 2.1* 2.4* 2.5* 3.0*   No results for input(s): LIPASE, AMYLASE in the last 168 hours. No results for input(s): AMMONIA in the last 168 hours. Coagulation Profile: No results for input(s): INR, PROTIME in the last 168 hours. Cardiac Enzymes: No results for input(s): CKTOTAL, CKMB, CKMBINDEX, TROPONINI in the last 168 hours. BNP (last 3 results) No results for input(s): PROBNP in the last 8760 hours. HbA1C: No results for input(s): HGBA1C in the last 72 hours. CBG: Recent Labs  Lab 01/07/19 2041 01/08/19 0729 01/08/19 1625 01/08/19 2104 01/09/19 0741  GLUCAP 175* 98 231* 158* 96   Lipid Profile: No results for input(s): CHOL, HDL, LDLCALC, TRIG, CHOLHDL, LDLDIRECT in the last 72 hours. Thyroid Function Tests: No results for input(s): TSH, T4TOTAL, FREET4, T3FREE, THYROIDAB in the last 72 hours. Anemia Panel: No results for input(s): VITAMINB12, FOLATE, FERRITIN, TIBC, IRON, RETICCTPCT in the last 72 hours. Urine analysis:    Component Value Date/Time   COLORURINE  YELLOW 12/18/2018 1527   APPEARANCEUR HAZY (A) 12/18/2018 1527   LABSPEC 1.019 12/18/2018 1527   PHURINE 6.0 12/18/2018 1527   GLUCOSEU NEGATIVE 12/18/2018 1527   HGBUR NEGATIVE 12/18/2018 1527   BILIRUBINUR NEGATIVE 12/18/2018 1527   [place] 12/18/2018 1527   PROTEINUR 30 (A) 12/18/2018 1527   UROBILINOGEN 0.2 02/06/2015 1353   NITRITE NEGATIVE 12/18/2018 1527   LEUKOCYTESUR NEGATIVE 12/18/2018 1527   Recent Results (from the past 240 hour(s))  Culture, blood (routine x 2)     Status: Abnormal   Collection Time: 01/02/19 11:40 AM   Specimen: BLOOD  Result Value Ref Range Status   Specimen Description   Final    BLOOD LEFT ANTECUBITAL Performed at [facility] [address], [place], [place] [address]    Special  Requests   Final    BOTTLES DRAWN AEROBIC ONLY Blood Culture adequate volume Performed at [facility] [address] [place], [place] [address]    Culture  Setup Time   Final    GRAM POSITIVE COCCI AEROBIC BOTTLE ONLY CRITICAL VALUE NOTED.  VALUE IS CONSISTENT WITH PREVIOUSLY REPORTED AND CALLED VALUE.    Culture (A)  Final    STREPTOCOCCUS PNEUMONIAE SUSCEPTIBILITIES PERFORMED ON PREVIOUS CULTURE WITHIN THE LAST 5 DAYS. Performed at [facility], [place] [address] [place], [place] [address]    Report Status 01/05/2019 FINAL  Final  Culture, blood (routine x 2)     Status: Abnormal   Collection Time: 01/02/19 11:45 AM   Specimen: BLOOD  Result Value Ref Range Status   Specimen Description   Final    BLOOD LEFT HAND Performed at [facility] [address], [place], [place] [address]    Special Requests   Final    BOTTLES DRAWN AEROBIC ONLY Blood Culture adequate volume Performed at [facility] [address], [place], [place] [address]    Culture  Setup Time   Final    GRAM POSITIVE COCCI AEROBIC BOTTLE ONLY CRITICAL RESULT CALLED TO, READ BACK BY AND VERIFIED WITH: Jene Every PharmD 9:40 01/03/19 ([email or web address]) Performed at [facility], [place] [address], [place], [place] [address]    Culture STREPTOCOCCUS PNEUMONIAE (A)  Final   Report Status 01/05/2019 FINAL  Final   Organism ID, Bacteria STREPTOCOCCUS PNEUMONIAE  Final      Susceptibility   Streptococcus pneumoniae - MIC*    ERYTHROMYCIN >=8 RESISTANT Resistant     LEVOFLOXACIN 1 SENSITIVE Sensitive     VANCOMYCIN 0.5 SENSITIVE Sensitive     PENICILLIN (non-meningitis) <=0.06 SENSITIVE Sensitive     CEFTRIAXONE (non-meningitis) <=0.12 SENSITIVE Sensitive     * STREPTOCOCCUS PNEUMONIAE  Blood Culture ID Panel (Reflexed)     Status: Abnormal   Collection Time: 01/02/19 11:45 AM  Result Value Ref Range Status   Enterococcus species NOT DETECTED NOT DETECTED  Final   Listeria monocytogenes NOT DETECTED NOT DETECTED Final   Staphylococcus species NOT DETECTED NOT DETECTED Final   Staphylococcus aureus (BCID) NOT DETECTED NOT DETECTED Final   Streptococcus species DETECTED (A) NOT DETECTED Final    Comment: CRITICAL RESULT CALLED TO, READ BACK BY AND VERIFIED WITH: Jene Every PharmD 9:40 01/03/19 ([email or web address])    Streptococcus agalactiae NOT DETECTED NOT DETECTED Final   Streptococcus pneumoniae DETECTED (A) NOT DETECTED Final    Comment: CRITICAL RESULT CALLED TO, READ BACK BY AND VERIFIED WITH: Jene Every PharmD 9:40 01/03/19 ([email or web address])    Streptococcus  pyogenes NOT DETECTED NOT DETECTED Final   Acinetobacter baumannii NOT DETECTED NOT DETECTED Final   Enterobacteriaceae species NOT DETECTED NOT DETECTED Final   Enterobacter cloacae complex NOT DETECTED NOT DETECTED Final   Escherichia coli NOT DETECTED NOT DETECTED Final   Klebsiella oxytoca NOT DETECTED NOT DETECTED Final   Klebsiella pneumoniae NOT DETECTED NOT DETECTED Final   Proteus species NOT DETECTED NOT DETECTED Final   Serratia marcescens NOT DETECTED NOT DETECTED Final   Haemophilus influenzae NOT DETECTED NOT DETECTED Final   Neisseria meningitidis NOT DETECTED NOT DETECTED Final   Pseudomonas aeruginosa NOT DETECTED NOT DETECTED Final   Candida albicans NOT DETECTED NOT DETECTED Final   Candida glabrata NOT DETECTED NOT DETECTED Final   Candida krusei NOT DETECTED NOT DETECTED Final   Candida parapsilosis NOT DETECTED NOT DETECTED Final   Candida tropicalis NOT DETECTED NOT DETECTED Final    Comment: Performed at [facility], [place] [address], [place], [place] [address]  Culture, blood (routine x 2)     Status: None (Preliminary result)   Collection Time: 01/05/19  4:30 PM   Specimen: BLOOD  Result Value Ref [place] Status   Specimen Description   Final    BLOOD LEFT ARM Performed at [facility] [address] [place], [place] [address]    Special Requests    Final    BOTTLES DRAWN AEROBIC AND ANAEROBIC Blood Culture adequate volume Performed at [facility] [address], [place], [place] [address]    Culture   Final    NO GROWTH 3 DAYS Performed at [facility], [place] [address], [place], [place] [address]    Report Status PENDING  Incomplete  Culture, blood (routine x 2)     Status: None (Preliminary result)   Collection Time: 01/05/19  4:38 PM   Specimen: BLOOD  Result Value Ref [place] Status   Specimen Description   Final    BLOOD LEFT HAND Performed at [facility] [address] [place], [place] [address]    Special Requests   Final    BOTTLES DRAWN AEROBIC AND ANAEROBIC Blood Culture adequate volume Performed at [facility] [address] [place], [place] [address]    Culture   Final    NO GROWTH 3 DAYS Performed at [facility], [place] [address] [place], [place] [address]    Report Status PENDING  Incomplete      Radiology Studies: [place] Ekg Site Rite  Result Date: 01/09/2019 If Site Rite image not attached, placement could not be confirmed due to current cardiac rhythm.   Scheduled Meds: . aspirin  81 mg Oral Daily  . atorvastatin  20 mg Oral Daily  . busPIRone  15 mg Oral TID  . cholecalciferol  5,000 Units Oral Daily  . clonazepam  1 mg Oral BID  . divalproex  250 mg Oral Q12H  . enoxaparin (LOVENOX) injection  40 mg Subcutaneous Q12H  . feeding supplement (ENSURE ENLIVE)  237 mL Oral TID BM  . fluconazole  100 mg Oral Daily  . fluticasone  2 spray Each Nare Daily  . gabapentin  300 mg Oral 2 times per day   And  . gabapentin  600 mg Oral QHS  . insulin aspart  0-9 Units Subcutaneous TID WC  . insulin glargine  10 Units Subcutaneous Daily  . latanoprost  1 drop Both Eyes QHS  . magic mouthwash w/lidocaine  15 mL  Oral TID  . mouth rinse  15 mL Mouth Rinse BID  . multivitamin with minerals  1 tablet Oral Daily  . pantoprazole  40 mg Oral  Daily  . polyethylene glycol  17 g Oral Daily  . saccharomyces boulardii  250 mg Oral BID  . tamsulosin  0.4 mg Oral QPC breakfast  . venlafaxine XR  75 mg Oral Q breakfast   Continuous Infusions: . sodium chloride 999 mL/hr at 12/31/18 1027  . sodium chloride 10 mL/hr at 01/08/19 2234  . cefTRIAXone (ROCEPHIN)  IV 2 g (01/08/19 1645)     LOS: 22 days   Time spent: 25 minutes.  Patrecia Pour, MD [facility] [email or web address] Password [email or web address] 01/09/2019, 11:00 AM

## 2019-01-09 NOTE — Progress Notes (Signed)
Spoke with Eliezer Lofts, RN concerning PICC order. Patient can sign consent. PICC for long term antibiotics. Patient isn't sited to discharge soon. Attempts will be made, but PICC may not be placed until tomorrow. Eliezer Lofts, RN aware.

## 2019-01-10 LAB — CBC WITH DIFFERENTIAL/PLATELET
Abs Immature Granulocytes: 0.09 10*3/uL — ABNORMAL HIGH (ref 0.00–0.07)
Basophils Absolute: 0 10*3/uL (ref 0.0–0.1)
Basophils Relative: 1 %
Eosinophils Absolute: 0.1 10*3/uL (ref 0.0–0.5)
Eosinophils Relative: 2 %
HCT: 43 % (ref 39.0–52.0)
Hemoglobin: 14.2 g/dL (ref 13.0–17.0)
Immature Granulocytes: 2 %
Lymphocytes Relative: 27 %
Lymphs Abs: 1 10*3/uL (ref 0.7–4.0)
MCH: 32.9 pg (ref 26.0–34.0)
MCHC: 33 g/dL (ref 30.0–36.0)
MCV: 99.5 fL (ref 80.0–100.0)
Monocytes Absolute: 0.2 10*3/uL (ref 0.1–1.0)
Monocytes Relative: 6 %
Neutro Abs: 2.3 10*3/uL (ref 1.7–7.7)
Neutrophils Relative %: 62 %
Platelets: 162 10*3/uL (ref 150–400)
RBC: 4.32 MIL/uL (ref 4.22–5.81)
RDW: 14.7 % (ref 11.5–15.5)
WBC: 3.7 10*3/uL — ABNORMAL LOW (ref 4.0–10.5)
nRBC: 0 % (ref 0.0–0.2)

## 2019-01-10 LAB — CULTURE, BLOOD (ROUTINE X 2)
Culture: NO GROWTH
Culture: NO GROWTH
Special Requests: ADEQUATE
Special Requests: ADEQUATE

## 2019-01-10 LAB — COMPREHENSIVE METABOLIC PANEL
ALT: 76 U/L — ABNORMAL HIGH (ref 0–44)
AST: 28 U/L (ref 15–41)
Albumin: 2.9 g/dL — ABNORMAL LOW (ref 3.5–5.0)
Alkaline Phosphatase: 90 U/L (ref 38–126)
Anion gap: 11 (ref 5–15)
BUN: 16 mg/dL (ref 8–23)
CO2: 28 mmol/L (ref 22–32)
Calcium: 9.2 mg/dL (ref 8.9–10.3)
Chloride: 98 mmol/L (ref 98–111)
Creatinine, Ser: 0.61 mg/dL (ref 0.61–1.24)
GFR calc Af Amer: >60 mL/min (ref >60)
GFR calc non Af Amer: >60 mL/min (ref >60)
Glucose, Bld: 132 mg/dL — ABNORMAL HIGH (ref 70–99)
Potassium: 4.4 mmol/L (ref 3.5–5.1)
Sodium: 137 mmol/L (ref 135–145)
Total Bilirubin: 0.5 mg/dL (ref 0.3–1.2)
Total Protein: 5.4 g/dL — ABNORMAL LOW (ref 6.5–8.1)

## 2019-01-10 LAB — GLUCOSE, CAPILLARY
Glucose-Capillary: 247 mg/dL — ABNORMAL HIGH (ref 70–99)
Glucose-Capillary: 132 mg/dL — ABNORMAL HIGH (ref 70–99)
Glucose-Capillary: 203 mg/dL — ABNORMAL HIGH (ref 70–99)
Glucose-Capillary: 159 mg/dL — ABNORMAL HIGH (ref 70–99)
Glucose-Capillary: 181 mg/dL — ABNORMAL HIGH (ref 70–99)

## 2019-01-10 LAB — C-REACTIVE PROTEIN: CRP: <0.8 mg/dL (ref <1.0)

## 2019-01-10 MED ORDER — SODIUM CHLORIDE 0.9% FLUSH
10.0000 mL | INTRAVENOUS | Status: DC | PRN
  Administered 2019-01-11: 21:00:00 10 mL
  Filled 2019-01-10: qty 40

## 2019-01-10 MED ORDER — SODIUM CHLORIDE 0.9% FLUSH
10.0000 mL | Freq: Two times a day (BID) | INTRAVENOUS | Status: DC
  Administered 2019-01-10: 10 mL
  Administered 2019-01-10: 20 mL
  Administered 2019-01-11 – 2019-01-13 (×5): 10 mL

## 2019-01-10 NOTE — Progress Notes (Signed)
Physical Therapy Treatment Patient Details Name: Joel Greene MRN: [id number] DOB: 04/11/1951 Today's Date: 01/10/2019    History of Present Illness Pt is a 68 y.o. male admitted 12/18/18 with fever, chills and SOB; became progressively hypoxic and tachypnic, tested postiive for COVID-19. ETT 6/20-6/22. PMH includes OSA, IBS, DJD, glaucoma.    PT Comments    The patient did ambulate x 4' with a RW with significant decompensation of cardiopulmonary status: Required increas of O2 from 4 L HFNC to  8 Liters for recovery, HR up to 139 and RR as high as 56, Requires time to recovery, gradually able to return to 6  L HFNC after resting. Patient needed Bed pan which also caused DOE, sats dropping and increased RR and HR. Patient is slowly improving in mobility, significantly pulmonary compromised. Continue PT. Use finger probe and forehead probe, Nelcor monitor not  Available.  Follow Up Recommendations  SNF     Equipment Recommendations    none   Recommendations for Other Services       Precautions / Restrictions Precautions Precaution Comments: check O2 sats with nellcor forehead probe and monitor is best, gets anxious    Mobility  Bed Mobility   Bed Mobility: Supine to Sit     Supine to sit: Min assist;HOB elevated     General bed mobility comments: min guard and extra time, patient able to get self upright, encouraged Slowing breath. Gradual decr. in sats to 23' on 6 L HFNC.  increased to 8 for mobility  Transfers   Equipment used: Rolling walker (2 wheeled)   Sit to Stand: Mod assist;+2 safety/equipment;+2 physical assistance;From elevated surface            Ambulation/Gait Ambulation/Gait assistance: Min assist;+2 safety/equipment;+2 physical assistance Gait Distance (Feet): 4 Feet Assistive device: Rolling walker (2 wheeled) Gait Pattern/deviations: Step-to pattern     General Gait Details: Able to get to recliner, decreased descent due to SB and hurried, . RR  up into 40's, SAts dropped to low 80's,  HR 139. Required  some time to recover, asked to place chair back and feet up.. Partial stand x 3 to get bedpan unde him which increased RR to 56 and sats down to low 80's, increased oxygen to 10 for recovery with gradual decrease in oxygen to 6 L.Sats stabilizing at 90% and HR down 115. RR 30's/   Stairs             Wheelchair Mobility    Modified Rankin (Stroke Patients Only)       Balance   Sitting-balance support: Feet supported;Bilateral upper extremity supported Sitting balance-Leahy Scale: Fair Sitting balance - Comments: supervision EOB, however, cannot tolerate EOB long due to increased RR decreased sats, and distress.     Standing balance-Leahy Scale: Poor Standing balance comment: reliant on RW, can't stand for very long                            Cognition Arousal/Alertness: Awake/alert Behavior During Therapy: Anxious Overall Cognitive Status: Impaired/Different from baseline Area of Impairment: Attention;Safety/judgement;Awareness;Problem solving;Memory                               General Comments: improved in following and staying on task. does tend to talk about unrelated  things.      Exercises      General Comments  Pertinent Vitals/Pain Pain Assessment: No/denies pain    Home Living                      Prior Function            PT Goals (current goals can now be found in the care plan section) Progress towards PT goals: Progressing toward goals    Frequency    Min 3X/week      PT Plan Current plan remains appropriate    Co-evaluation              AM-PAC PT "6 Clicks" Mobility   Outcome Measure  Help needed turning from your back to your side while in a flat bed without using bedrails?: A Little Help needed moving from lying on your back to sitting on the side of a flat bed without using bedrails?: A Little Help needed moving to and from  a bed to a chair (including a wheelchair)?: A Lot Help needed standing up from a chair using your arms (e.g., wheelchair or bedside chair)?: A Lot Help needed to walk in hospital room?: Total Help needed climbing 3-5 steps with a railing? : Total 6 Click Score: 12    End of Session Equipment Utilized During Treatment: Gait belt;Oxygen Activity Tolerance: Treatment limited secondary to medical complications (Comment)(incr. HR and RR and decr sats) Patient left: in chair;with call bell/phone within reach Nurse Communication: Mobility status PT Visit Diagnosis: Difficulty in walking, not elsewhere classified (R26.2)     Time: 3710-6269 PT Time Calculation (min) (ACUTE ONLY): 71 min  Charges:  $Therapeutic Activity: 68-82 mins                     Tresa Endo PT Acute Rehabilitation Services Pager [phone] Office [phone]    Joel Greene 01/10/2019, 5:01 PM

## 2019-01-10 NOTE — Progress Notes (Signed)
Peripherally Inserted Central Catheter/Midline Placement  The IV Nurse has discussed with the patient and/or persons authorized to consent for the patient, the purpose of this procedure and the potential benefits and risks involved with this procedure.  The benefits include less needle sticks, lab draws from the catheter, and the patient may be discharged home with the catheter. Risks include, but not limited to, infection, bleeding, blood clot (thrombus formation), and puncture of an artery; nerve damage and irregular heartbeat and possibility to perform a PICC exchange if needed/ordered by physician.  Alternatives to this procedure were also discussed.  Bard Power PICC patient education guide, fact sheet on infection prevention and patient information card has been provided to patient /or left at bedside.    PICC/Midline Placement Documentation  PICC Single Lumen 32/91/91 PICC Right Basilic 40 cm 0 cm (Active)  Indication for Insertion or Continuance of Line Prolonged intravenous therapies 01/10/19 1336  Exposed Catheter (cm) 0 cm 01/10/19 1336  Site Assessment Clean;Dry;Intact 01/10/19 1336  Line Status Flushed;Blood return noted 01/10/19 1336  Dressing Type Transparent 01/10/19 1336  Dressing Status Clean;Dry;Intact;Antimicrobial disc in place 01/10/19 1336  Dressing Intervention New dressing 01/10/19 1336  Dressing Change Due 01/17/19 01/10/19 1336       Christella Noa Albarece 01/10/2019, 1:37 PM

## 2019-01-10 NOTE — Progress Notes (Signed)
PROGRESS NOTE  Joel Greene  WLN:[id number] DOB: January 15, 1951 DOA: 12/18/2018 PCP: Redmond School, MD   Brief Narrative: Patient is a 68 y.o. male with PMHx of CAD, depression with anxiety, chronic pain-presented with fever, shortness of breath-he unfortunately became progressively hypoxic and was intubated and transferred to [facility].  Treated with steroids, Remdesivir, Actemra and convalescent plasma.  Subsequently extubated on 6/22.  Hospital course has then been complicated by extreme anxiety and lately by superimposed Streptococcus pneumoniae pneumonia and bacteremia.   Assessment & Plan: Active Problems:   Acute respiratory failure with hypoxia (HCC)   Sepsis due to undetermined organism ([place])   Pneumonia due to COVID-19 virus   Acute respiratory failure with hypoxemia (HCC)   Hypokalemia   Encounter for intubation   History of ETT  Acute hypoxic respiratory failure due to covid-19 pneumonia: Received remdesivir, tocilizumab and convalescent plasma.  - Continue supplemental oxygen. Anticipate protracted recovery. Down to 3L O2 at rest, but severely dyspneic with significant hypoxia with even limited exertion. - Continue airborne, contact precautions. PPE including surgical gown, gloves, cap, shoe covers, and CAPR used during this encounter in a negative pressure room.  - Inflammatory markers, once normalized, now returning to downward trend following Tx Pneumococcus. Stopped steroids as he had received >2 weeks and need to clear bacteremia.  - Maintain euvolemia/net negative.  - Avoid NSAIDs - Recommend aggressive use of incentive spirometry. - Discussed with patient that he should expect a prolonged recovery from these two life-threatening infections.  Sepsis due to Pneumococcal pneumonia and bacteremia: Sepsis has resolved. WBC normal, afebrile. CT showed RML, RLL infiltrates without complication (e.g. cavitation, empyema).  - Continue ceftriaxone 2g IV q24h,  susceptibility documented on initial cultures. Started cefepime 7/5, transitioned to CTX 7/6. - Discussed with Dr. Prince Rome, ID on 7/8. Will require 14 days IV therapy.   - Monitor blood cultures, NGTD at >4 days, so ok to insert PICC when PICC team available.  Right perinephric stranding: Incidentally noted on CT chest 7/8. No dysuria noted, on CTX regardless.   Oral candidiasis: Mouth sores dramatically improved - Magic mouthwash TID scheduled - Continue diflucan x7 days (7/8 - 7/14)  Anxiety: Severe, required precedex followed by benzodiazepine taper. Improving. - Continue xanax.  - Continue buspar, clonazepam, depakote, and effexor.   Acute on chronic HFpEF: Improved volume status.  - Continue to monitor I/O, daily weights. Weight roughly stabilized.  HTN:  - Monitor, prn hydralazine  BPH:  - Continue flomax, monitor UOP  OSA: Unable to use CPAP currently.   T2DM with steroid-induced hyperglycemia: HbA1c 6.5%. At inpatient goal. - Continue lantus 10u + SSI, remains well-controlled.  LFT elevation: ALT stable at 167.  - Monitor intermittently.  Thrombocytopenia: Mild, trending upward, not consistent with HITT.  - Continue to monitor intermittently.  DVT prophylaxis: Lovenox Code Status: DNR Family Communication: None at bedside. Disposition Plan: Uncertain. Will require 14 days IV antibiotics. Current recommendation is SNF, CSW consulted, as he remains very deconditioned.  Consultants:   PCCM  Procedures:   ETT 6/20 - 6/22  RUE PICC 6/21 6/27 - 7/7 returned to ICU due to progressive hypoxia, required precedex for severe anxiety.   Antimicrobials: 6/20 zosyn x1 6/20 vanc x1 6/21 azithro > 6/23 6/21 ceftriaxone > 6/23 6/20 remdesivir >  6/26 actemra x1 6/26 Convalescent plasma  Subjective: Feels comfortable at rest without oxygen, though SpO2 is ~85%. Cough is becoming more productive, scant hemoptysis which is stable (hgb 14.2). No leg swelling, no  chest  pain.   Objective: Vitals:   01/09/19 2045 01/10/19 0040 01/10/19 0350 01/10/19 0718  BP: 114/73 118/78 111/79   Pulse: (!) 110 (!) 104 98   Resp: 20 18 (!) 24   Temp: 98.3 F (36.8 C) 97.7 F (36.5 C) 97.9 F (36.6 C) 98.1 F (36.7 C)  TempSrc: Oral Oral  Oral  SpO2: 93% (!) 89% 95%   Weight:      Height:        Intake/Output Summary (Last 24 hours) at 01/10/2019 1131 Last data filed at 01/10/2019 0720 Gross per 24 hour  Intake -  Output 250 ml  Net -250 ml   Filed Weights   01/07/19 0500 01/08/19 0500 01/09/19 0637  Weight: 73.2 kg 73.8 kg 72.1 kg   Gen: Frail, pleasant male in no distress Pulm: Nonlabored but tachypneic at rest, SpO2 85-90% on room air. Crackles diffusely R > L but improved from prior exams. CV: Regular borderline tachycardia. No murmur, rub, or gallop. No JVD, no dependent edema. GI: Abdomen soft, non-tender, non-distended, with normoactive bowel sounds.  Ext: Warm, no deformities Skin: No rashes, lesions or ulcers on visualized skin. Neuro: Alert and oriented. No focal neurological deficits. Psych: Judgement and insight appear fair. Mood euthymic & affect congruent. Behavior is appropriate.    Data Reviewed: I have personally reviewed following labs and imaging studies  CBC: Recent Labs  Lab 01/04/19 0442 01/05/19 0415 01/06/19 0500 01/07/19 0500 01/10/19 0200  WBC 4.8 4.3 4.0 3.6* 3.7*  NEUTROABS  --   --   --   --  2.3  HGB 11.5* 11.6* 12.0* 14.3 14.2  HCT 34.0* 35.2* 36.9* 43.8 43.0  MCV 96.9 97.8 98.9 98.4 99.5  PLT 134* 132* 132* 147* 329   Basic Metabolic Panel: Recent Labs  Lab 01/04/19 0442 01/05/19 0415 01/06/19 0500 01/07/19 0500 01/10/19 0200  NA 138 138 138 139 137  K 3.9 3.7 4.0 3.8 4.4  CL 107 104 97* 98 98  CO2 22 26 28 26 28   GLUCOSE 217* 180* 164* 129* 132*  BUN 23 22 21 19 16   CREATININE 0.64 0.62 0.61 0.61 0.61  CALCIUM 8.2* 8.7* 8.6* 9.1 9.2   GFR: Estimated Creatinine Clearance: 81.5 mL/min (by C-G  formula based on SCr of 0.61 mg/dL). Liver Function Tests: Recent Labs  Lab 01/04/19 0442 01/05/19 0415 01/06/19 0500 01/07/19 0500 01/10/19 0200  AST 43* 74* 40 44* 28  ALT 79* 155* 167* 167* 76*  ALKPHOS 79 99 97 112 90  BILITOT 0.5 0.6 0.3 0.2* 0.5  PROT 4.6* 4.8* 4.8* 5.6* 5.4*  ALBUMIN 2.1* 2.4* 2.5* 3.0* 2.9*   No results for input(s): LIPASE, AMYLASE in the last 168 hours. No results for input(s): AMMONIA in the last 168 hours. Coagulation Profile: No results for input(s): INR, PROTIME in the last 168 hours. Cardiac Enzymes: No results for input(s): CKTOTAL, CKMB, CKMBINDEX, TROPONINI in the last 168 hours. BNP (last 3 results) No results for input(s): PROBNP in the last 8760 hours. HbA1C: No results for input(s): HGBA1C in the last 72 hours. CBG: Recent Labs  Lab 01/09/19 1216 01/09/19 1240 01/09/19 1610 01/09/19 2124 01/10/19 0721  GLUCAP 211* 210* 147* 174* 132*   Lipid Profile: No results for input(s): CHOL, HDL, LDLCALC, TRIG, CHOLHDL, LDLDIRECT in the last 72 hours. Thyroid Function Tests: No results for input(s): TSH, T4TOTAL, FREET4, T3FREE, THYROIDAB in the last 72 hours. Anemia Panel: No results for input(s): VITAMINB12, FOLATE, FERRITIN, TIBC, IRON, RETICCTPCT  in the last 72 hours. Urine analysis:    Component Value Date/Time   COLORURINE YELLOW 12/18/2018 1527   APPEARANCEUR HAZY (A) 12/18/2018 1527   LABSPEC 1.019 12/18/2018 1527   PHURINE 6.0 12/18/2018 1527   GLUCOSEU NEGATIVE 12/18/2018 1527   HGBUR NEGATIVE 12/18/2018 1527   BILIRUBINUR NEGATIVE 12/18/2018 1527   KETONESUR NEGATIVE 12/18/2018 1527   PROTEINUR 30 (A) 12/18/2018 1527   UROBILINOGEN 0.2 02/06/2015 1353   NITRITE NEGATIVE 12/18/2018 1527   LEUKOCYTESUR NEGATIVE 12/18/2018 1527   Recent Results (from the past 240 hour(s))  Culture, blood (routine x 2)     Status: Abnormal   Collection Time: 01/02/19 11:40 AM   Specimen: BLOOD  Result Value Ref Range Status   Specimen  Description   Final    BLOOD LEFT ANTECUBITAL Performed at [facility] [address], [place], [place] [address]    Special Requests   Final    BOTTLES DRAWN AEROBIC ONLY Blood Culture adequate volume Performed at [facility] [address], [place], [place] [address]    Culture  Setup Time   Final    GRAM POSITIVE COCCI AEROBIC BOTTLE ONLY CRITICAL VALUE NOTED.  VALUE IS CONSISTENT WITH PREVIOUSLY REPORTED AND CALLED VALUE.    Culture (A)  Final    STREPTOCOCCUS PNEUMONIAE SUSCEPTIBILITIES PERFORMED ON PREVIOUS CULTURE WITHIN THE LAST 5 DAYS. Performed at [facility], [place] [address] [place], [place] [address]    Report Status 01/05/2019 FINAL  Final  Culture, blood (routine x 2)     Status: Abnormal   Collection Time: 01/02/19 11:45 AM   Specimen: BLOOD  Result Value Ref Range Status   Specimen Description   Final    BLOOD LEFT HAND Performed at [facility] [address], [place], [place] [address]    Special Requests   Final    BOTTLES DRAWN AEROBIC ONLY Blood Culture adequate volume Performed at [facility] [address] [place], [place] [address]    Culture  Setup Time   Final    GRAM POSITIVE COCCI AEROBIC BOTTLE ONLY CRITICAL RESULT CALLED TO, READ BACK BY AND VERIFIED WITH: Jene Every PharmD 9:40 01/03/19 ([email or web address]) Performed at [facility], [place] [address], [place], [place] [address]    Culture STREPTOCOCCUS PNEUMONIAE (A)  Final   Report Status 01/05/2019 FINAL  Final   Organism ID, Bacteria STREPTOCOCCUS PNEUMONIAE  Final      Susceptibility   Streptococcus pneumoniae - MIC*    ERYTHROMYCIN >=8 RESISTANT Resistant     LEVOFLOXACIN 1 SENSITIVE Sensitive     VANCOMYCIN 0.5 SENSITIVE Sensitive     PENICILLIN (non-meningitis) <=0.06 SENSITIVE Sensitive     CEFTRIAXONE (non-meningitis) <=0.12 SENSITIVE Sensitive     * STREPTOCOCCUS PNEUMONIAE  Blood Culture ID Panel  (Reflexed)     Status: Abnormal   Collection Time: 01/02/19 11:45 AM  Result Value Ref Range Status   Enterococcus species NOT DETECTED NOT DETECTED Final   Listeria monocytogenes NOT DETECTED NOT DETECTED Final   Staphylococcus species NOT DETECTED NOT DETECTED Final   Staphylococcus aureus (BCID) NOT DETECTED NOT DETECTED Final   Streptococcus species DETECTED (A) NOT DETECTED Final    Comment: CRITICAL RESULT CALLED TO, READ BACK BY AND VERIFIED WITH: Jene Every PharmD 9:40 01/03/19 ([email or web address])    Streptococcus agalactiae NOT DETECTED NOT DETECTED Final   Streptococcus pneumoniae DETECTED (A) NOT DETECTED Final    Comment: CRITICAL RESULT CALLED TO,  READ BACK BY AND VERIFIED WITH: Jene Every PharmD 9:40 01/03/19 ([email or web address])    Streptococcus pyogenes NOT DETECTED NOT DETECTED Final   Acinetobacter baumannii NOT DETECTED NOT DETECTED Final   Enterobacteriaceae species NOT DETECTED NOT DETECTED Final   Enterobacter cloacae complex NOT DETECTED NOT DETECTED Final   Escherichia coli NOT DETECTED NOT DETECTED Final   Klebsiella oxytoca NOT DETECTED NOT DETECTED Final   Klebsiella pneumoniae NOT DETECTED NOT DETECTED Final   Proteus species NOT DETECTED NOT DETECTED Final   Serratia marcescens NOT DETECTED NOT DETECTED Final   Haemophilus influenzae NOT DETECTED NOT DETECTED Final   Neisseria meningitidis NOT DETECTED NOT DETECTED Final   Pseudomonas aeruginosa NOT DETECTED NOT DETECTED Final   Candida albicans NOT DETECTED NOT DETECTED Final   Candida glabrata NOT DETECTED NOT DETECTED Final   Candida krusei NOT DETECTED NOT DETECTED Final   Candida parapsilosis NOT DETECTED NOT DETECTED Final   Candida tropicalis NOT DETECTED NOT DETECTED Final    Comment: Performed at [facility], [place]. [address], [place], [place] [address]  Culture, blood (routine x 2)     Status: None (Preliminary result)   Collection Time: 01/05/19  4:30 PM   Specimen: BLOOD  Result Value Ref Range Status   Specimen  Description   Final    BLOOD LEFT ARM Performed at [facility] [address] [place], [place] [address]    Special Requests   Final    BOTTLES DRAWN AEROBIC AND ANAEROBIC Blood Culture adequate volume Performed at [facility] [address] [place], [place] [address]    Culture   Final    NO GROWTH 4 DAYS Performed at [facility], [place] [address] [place], [place] [address]    Report Status PENDING  Incomplete  Culture, blood (routine x 2)     Status: None (Preliminary result)   Collection Time: 01/05/19  4:38 PM   Specimen: BLOOD  Result Value Ref Range Status   Specimen Description   Final    BLOOD LEFT HAND Performed at [facility] [address], [place], [place] [address]    Special Requests   Final    BOTTLES DRAWN AEROBIC AND ANAEROBIC Blood Culture adequate volume Performed at [facility] [address] [place], [place] [address]    Culture   Final    NO GROWTH 4 DAYS Performed at [facility], [place] [address], [place],  [address]    Report Status PENDING  Incomplete      Radiology Studies: [place] Ekg Site Rite  Result Date: 01/09/2019 If Site Rite image not attached, placement could not be confirmed due to current cardiac rhythm.   Scheduled Meds: . aspirin  81 mg Oral Daily  . atorvastatin  20 mg Oral Daily  . busPIRone  15 mg Oral TID  . cholecalciferol  5,000 Units Oral Daily  . clonazepam  1 mg Oral BID  . divalproex  250 mg Oral Q12H  . enoxaparin (LOVENOX) injection  40 mg Subcutaneous Q12H  . feeding supplement (ENSURE ENLIVE)  237 mL Oral TID BM  . fluconazole  100 mg Oral Daily  . fluticasone  2 spray Each Nare Daily  . gabapentin  300 mg Oral 2 times per day   And  . gabapentin  600 mg Oral QHS  . insulin aspart  0-9 Units Subcutaneous TID WC  . insulin glargine  10 Units Subcutaneous Daily  .  latanoprost  1 drop Both Eyes QHS  . magic  mouthwash w/lidocaine  15 mL Oral TID  . mouth rinse  15 mL Mouth Rinse BID  . multivitamin with minerals  1 tablet Oral Daily  . pantoprazole  40 mg Oral Daily  . polyethylene glycol  17 g Oral Daily  . saccharomyces boulardii  250 mg Oral BID  . tamsulosin  0.4 mg Oral QPC breakfast  . venlafaxine XR  75 mg Oral Q breakfast   Continuous Infusions: . sodium chloride 999 mL/hr at 12/31/18 1027  . sodium chloride 10 mL/hr at 01/08/19 2234  . cefTRIAXone (ROCEPHIN)  IV 2 g (01/09/19 1636)     LOS: 23 days   Time spent: 25 minutes.  Patrecia Pour, MD [facility] [email or web address] Password [facility] 01/10/2019, 11:31 AM

## 2019-01-11 LAB — GLUCOSE, CAPILLARY
Glucose-Capillary: 115 mg/dL — ABNORMAL HIGH (ref 70–99)
Glucose-Capillary: 119 mg/dL — ABNORMAL HIGH (ref 70–99)
Glucose-Capillary: 194 mg/dL — ABNORMAL HIGH (ref 70–99)
Glucose-Capillary: 197 mg/dL — ABNORMAL HIGH (ref 70–99)

## 2019-01-11 MED ORDER — ENOXAPARIN SODIUM 40 MG/0.4ML ~~LOC~~ SOLN
40.0000 mg | SUBCUTANEOUS | Status: DC
  Administered 2019-01-12 – 2019-01-13 (×2): 40 mg via SUBCUTANEOUS
  Filled 2019-01-11 (×2): qty 0.4

## 2019-01-11 NOTE — Progress Notes (Signed)
PROGRESS NOTE  Joel Greene  TKZ:[id number] DOB: 1951-06-26 DOA: 12/18/2018 PCP: Redmond School, MD   Brief Narrative: Patient is a 68 y.o. male with PMHx of CAD, depression with anxiety, chronic pain-presented with fever, shortness of breath-he unfortunately became progressively hypoxic and was intubated and transferred to [facility].  Treated with steroids, Remdesivir, Actemra and convalescent plasma.  Subsequently extubated on 6/22.  Hospital course has then been complicated by extreme anxiety and lately by superimposed Streptococcus pneumoniae pneumonia and bacteremia.   Assessment & Plan: Active Problems:   Acute respiratory failure with hypoxia (HCC)   Sepsis due to undetermined organism ([place])   Pneumonia due to COVID-19 virus   Acute respiratory failure with hypoxemia (HCC)   Hypokalemia   Encounter for intubation   History of ETT  Acute hypoxic respiratory failure due to covid-19 pneumonia: Received remdesivir, tocilizumab and convalescent plasma.  - Continue supplemental oxygen. Anticipate protracted recovery. Remains significantly hypoxic with any exertion. Though he's on room air at rest, he is at the edge of physiologic reserve.   - Continue airborne, contact precautions. PPE including surgical gown, gloves, cap, shoe covers, and CAPR used during this encounter in a negative pressure room.  - Inflammatory markers, once normalized, now returning to downward trend following Tx of Pneumococcus. Stopped steroids as he had received >2 weeks and need to clear bacteremia.  - Maintain euvolemia/net negative.  - Avoid NSAIDs - Recommend aggressive use of incentive spirometry. - Discussed with patient that he should expect a prolonged recovery from these two life-threatening infections.  Sepsis due to Pneumococcal pneumonia and bacteremia: Sepsis has resolved. WBC normal, afebrile. CT showed RML, RLL infiltrates without complication (e.g. cavitation, empyema).  - Continue  ceftriaxone 2g IV q24h, susceptibility documented on initial cultures. Started cefepime 7/5, transitioned to CTX 7/6. - Discussed with Dr. Prince Rome, ID on 7/8. Will require 14 days IV therapy. PICC placed 7/13 RUE. Repeat blood cultures from 7/8 are negative x5 days.  Right perinephric stranding: Incidentally noted on CT chest 7/8. No dysuria noted, on CTX regardless.   Oral candidiasis: Mouth sores dramatically improved - Magic mouthwash TID scheduled - Continue diflucan x7 days (7/8 - 7/14)  Anxiety: Severe, required precedex followed by benzodiazepine taper, but since has stabilized. - Continue xanax.  - Continue buspar, clonazepam, depakote, and effexor.   Acute on chronic HFpEF: Improved volume status.  - Continue to monitor I/O, daily weights. Weight roughly stabilized.  HTN:  - Monitor, prn hydralazine  BPH:  - Continue flomax, monitor UOP  OSA: Unable to use CPAP currently.   T2DM with steroid-induced hyperglycemia: HbA1c 6.5%. At inpatient goal. - Continue lantus 10u + SSI, remains at inpatient goal.  LFT elevation: ALT stable at 167.  - Monitor intermittently.  Thrombocytopenia: Mild, trending upward, not consistent with HITT.  - Continue to monitor intermittently.  DVT prophylaxis: Lovenox Code Status: DNR Family Communication: None at bedside. Disposition Plan: Uncertain. Will require 14 days IV antibiotics. Current recommendation is SNF, CSW consulted, as he remains very deconditioned.   Consultants:   PCCM  Procedures:   ETT 6/20 - 6/22  RUE PICC 6/21 6/27 - 7/7 returned to ICU due to progressive hypoxia, required precedex for severe anxiety.   Antimicrobials: 6/20 zosyn x1 6/20 vanc x1 6/21 azithro > 6/23 6/21 ceftriaxone > 6/23 6/20 remdesivir >  6/26 actemra x1 6/26 Convalescent plasma  Subjective: Feels he's making progress, able to come off oxygen at rest now though with any exertion, becomes significant  tachypneic and tachycardic with  worsening hypoxia.  Objective: Vitals:   01/11/19 0446 01/11/19 0536 01/11/19 0800 01/11/19 1150  BP:  112/68    Pulse:  (!) 103    Resp:  16    Temp:  97.9 F (36.6 C) 98.5 F (36.9 C) 97.8 F (36.6 C)  TempSrc:  Oral Oral Oral  SpO2:  99%    Weight: 76.7 kg     Height:        Intake/Output Summary (Last 24 hours) at 01/11/2019 1451 Last data filed at 01/11/2019 1406 Gross per 24 hour  Intake 540 ml  Output 3050 ml  Net -2510 ml   Filed Weights   01/08/19 0500 01/09/19 0637 01/11/19 0446  Weight: 73.8 kg 72.1 kg 76.7 kg   Gen: 68 y.o. male in no distress Pulm: Nonlabored on room air at rest. Scant crackles diffusely.. CV: Regular rate and rhythm. No murmur, rub, or gallop. No JVD, no dependent edema. GI: Abdomen soft, non-tender, non-distended, with normoactive bowel sounds.  Ext: Warm, no deformities Skin: No rashes, lesions or ulcers on visualized skin. Neuro: Alert and oriented. No focal neurological deficits. Psych: Judgement and insight appear fair. Mood euthymic & affect congruent. Behavior is appropriate.    Data Reviewed: I have personally reviewed following labs and imaging studies  CBC: Recent Labs  Lab 01/05/19 0415 01/06/19 0500 01/07/19 0500 01/10/19 0200  WBC 4.3 4.0 3.6* 3.7*  NEUTROABS  --   --   --  2.3  HGB 11.6* 12.0* 14.3 14.2  HCT 35.2* 36.9* 43.8 43.0  MCV 97.8 98.9 98.4 99.5  PLT 132* 132* 147* 654   Basic Metabolic Panel: Recent Labs  Lab 01/05/19 0415 01/06/19 0500 01/07/19 0500 01/10/19 0200  NA 138 138 139 137  K 3.7 4.0 3.8 4.4  CL 104 97* 98 98  CO2 26 28 26 28   GLUCOSE 180* 164* 129* 132*  BUN 22 21 19 16   CREATININE 0.62 0.61 0.61 0.61  CALCIUM 8.7* 8.6* 9.1 9.2   GFR: Estimated Creatinine Clearance: 81.5 mL/min (by C-G formula based on SCr of 0.61 mg/dL). Liver Function Tests: Recent Labs  Lab 01/05/19 0415 01/06/19 0500 01/07/19 0500 01/10/19 0200  AST 74* 40 44* 28  ALT 155* 167* 167* 76*  ALKPHOS 99 97  112 90  BILITOT 0.6 0.3 0.2* 0.5  PROT 4.8* 4.8* 5.6* 5.4*  ALBUMIN 2.4* 2.5* 3.0* 2.9*   No results for input(s): LIPASE, AMYLASE in the last 168 hours. No results for input(s): AMMONIA in the last 168 hours. Coagulation Profile: No results for input(s): INR, PROTIME in the last 168 hours. Cardiac Enzymes: No results for input(s): CKTOTAL, CKMB, CKMBINDEX, TROPONINI in the last 168 hours. BNP (last 3 results) No results for input(s): PROBNP in the last 8760 hours. HbA1C: No results for input(s): HGBA1C in the last 72 hours. CBG: Recent Labs  Lab 01/10/19 1129 01/10/19 1649 01/10/19 2130 01/11/19 0643 01/11/19 1146  GLUCAP 203* 159* 181* 115* 119*   Lipid Profile: No results for input(s): CHOL, HDL, LDLCALC, TRIG, CHOLHDL, LDLDIRECT in the last 72 hours. Thyroid Function Tests: No results for input(s): TSH, T4TOTAL, FREET4, T3FREE, THYROIDAB in the last 72 hours. Anemia Panel: No results for input(s): VITAMINB12, FOLATE, FERRITIN, TIBC, IRON, RETICCTPCT in the last 72 hours. Urine analysis:    Component Value Date/Time   COLORURINE YELLOW 12/18/2018 1527   APPEARANCEUR HAZY (A) 12/18/2018 1527   LABSPEC 1.019 12/18/2018 1527   PHURINE 6.0 12/18/2018 1527  GLUCOSEU NEGATIVE 12/18/2018 [place] 12/18/2018 1527   [place] 12/18/2018 1527   [place] 12/18/2018 1527   PROTEINUR 30 (A) 12/18/2018 1527   UROBILINOGEN 0.2 02/06/2015 1353   NITRITE NEGATIVE 12/18/2018 1527   LEUKOCYTESUR NEGATIVE 12/18/2018 1527   Recent Results (from the past 240 hour(s))  Culture, blood (routine x 2)     Status: Abnormal   Collection Time: 01/02/19 11:40 AM   Specimen: BLOOD  Result Value Ref Range Status   Specimen Description   Final    BLOOD LEFT ANTECUBITAL Performed at [facility], [place] [address] [place], [place] [address]    Special Requests   Final    BOTTLES DRAWN AEROBIC ONLY Blood Culture adequate volume Performed at  [place] [address], [place], [place] [address]    Culture  Setup Time   Final    GRAM POSITIVE COCCI AEROBIC BOTTLE ONLY CRITICAL VALUE NOTED.  VALUE IS CONSISTENT WITH PREVIOUSLY REPORTED AND CALLED VALUE.    Culture (A)  Final    STREPTOCOCCUS PNEUMONIAE SUSCEPTIBILITIES PERFORMED ON PREVIOUS CULTURE WITHIN THE LAST 5 DAYS. Performed at [facility], [place] [address] [place], [place] [address]    Report Status 01/05/2019 FINAL  Final  Culture, blood (routine x 2)     Status: Abnormal   Collection Time: 01/02/19 11:45 AM   Specimen: BLOOD  Result Value Ref Range Status   Specimen Description   Final    BLOOD LEFT HAND Performed at [facility] [address], [place], [place] [address]    Special Requests   Final    BOTTLES DRAWN AEROBIC ONLY Blood Culture adequate volume Performed at [facility] [address], [place], [place] [address]    Culture  Setup Time   Final    GRAM POSITIVE COCCI AEROBIC BOTTLE ONLY CRITICAL RESULT CALLED TO, READ BACK BY AND VERIFIED WITH: Jene Every PharmD 9:40 01/03/19 ([email or web address]) Performed at [facility], [place] [address], [place],  [address]    Culture STREPTOCOCCUS PNEUMONIAE (A)  Final   Report Status 01/05/2019 FINAL  Final   Organism ID, Bacteria STREPTOCOCCUS PNEUMONIAE  Final      Susceptibility   Streptococcus pneumoniae - MIC*    ERYTHROMYCIN >=8 RESISTANT Resistant     LEVOFLOXACIN 1 SENSITIVE Sensitive     VANCOMYCIN 0.5 SENSITIVE Sensitive     PENICILLIN (non-meningitis) <=0.06 SENSITIVE Sensitive     CEFTRIAXONE (non-meningitis) <=0.12 SENSITIVE Sensitive     * STREPTOCOCCUS PNEUMONIAE  Blood Culture ID Panel (Reflexed)     Status: Abnormal   Collection Time: 01/02/19 11:45 AM  Result Value Ref Range Status   Enterococcus species NOT DETECTED NOT DETECTED Final   Listeria monocytogenes NOT DETECTED NOT DETECTED Final   Staphylococcus species NOT  DETECTED NOT DETECTED Final   Staphylococcus aureus (BCID) NOT DETECTED NOT DETECTED Final   Streptococcus species DETECTED (A) NOT DETECTED Final    Comment: CRITICAL RESULT CALLED TO, READ BACK BY AND VERIFIED WITH: Jene Every PharmD 9:40 01/03/19 ([email or web address])    Streptococcus agalactiae NOT DETECTED NOT DETECTED Final   Streptococcus pneumoniae DETECTED (A) NOT DETECTED Final    Comment: CRITICAL RESULT CALLED TO, READ BACK BY AND VERIFIED WITH: Jene Every PharmD 9:40 01/03/19 ([email or web address])    Streptococcus pyogenes NOT DETECTED NOT DETECTED Final   Acinetobacter baumannii NOT DETECTED NOT DETECTED Final   Enterobacteriaceae species NOT DETECTED NOT DETECTED Final  Enterobacter cloacae complex NOT DETECTED NOT DETECTED Final   Escherichia coli NOT DETECTED NOT DETECTED Final   Klebsiella oxytoca NOT DETECTED NOT DETECTED Final   Klebsiella pneumoniae NOT DETECTED NOT DETECTED Final   Proteus species NOT DETECTED NOT DETECTED Final   Serratia marcescens NOT DETECTED NOT DETECTED Final   Haemophilus influenzae NOT DETECTED NOT DETECTED Final   Neisseria meningitidis NOT DETECTED NOT DETECTED Final   Pseudomonas aeruginosa NOT DETECTED NOT DETECTED Final   Candida albicans NOT DETECTED NOT DETECTED Final   Candida glabrata NOT DETECTED NOT DETECTED Final   Candida krusei NOT DETECTED NOT DETECTED Final   Candida parapsilosis NOT DETECTED NOT DETECTED Final   Candida tropicalis NOT DETECTED NOT DETECTED Final    Comment: Performed at [facility], [place] [address] [place], [place] [address]  Culture, blood (routine x 2)     Status: None   Collection Time: 01/05/19  4:30 PM   Specimen: BLOOD  Result Value Ref Range Status   Specimen Description   Final    BLOOD LEFT ARM Performed at [facility] [address] [place], [place] [address]    Special Requests   Final    BOTTLES DRAWN AEROBIC AND ANAEROBIC Blood Culture adequate volume Performed at [place] [address] [place], [place] [address]    Culture   Final    NO GROWTH 5 DAYS Performed at [facility], [place] [address], [place], [place] [address]    Report Status 01/10/2019 FINAL  Final  Culture, blood (routine x 2)     Status: None   Collection Time: 01/05/19  4:38 PM   Specimen: BLOOD  Result Value Ref Range Status   Specimen Description   Final    BLOOD LEFT HAND Performed at [facility] [address], [place], [place] [address]    Special Requests   Final    BOTTLES DRAWN AEROBIC AND ANAEROBIC Blood Culture adequate volume Performed at [facility] [address], [place], [place] [address]    Culture   Final    NO GROWTH 5 DAYS Performed at [facility], [place] [address] [place],  [address]    Report Status 01/10/2019 FINAL  Final      Radiology Studies: No results found.  Scheduled Meds: . aspirin  81 mg Oral Daily  . atorvastatin  20 mg Oral Daily  . busPIRone  15 mg Oral TID  . cholecalciferol  5,000 Units Oral Daily  . clonazepam  1 mg Oral BID  . divalproex  250 mg Oral Q12H  . enoxaparin (LOVENOX) injection  40 mg Subcutaneous Q12H  . feeding supplement (ENSURE ENLIVE)  237 mL Oral TID BM  . fluticasone  2 spray Each Nare Daily  . gabapentin  300 mg Oral 2 times per day   And  . gabapentin  600 mg Oral QHS  . insulin aspart  0-9 Units Subcutaneous TID WC  . insulin glargine  10 Units Subcutaneous Daily  . latanoprost  1 drop Both Eyes QHS  . magic mouthwash w/lidocaine  15 mL Oral TID  . mouth rinse  15 mL Mouth Rinse BID  . multivitamin with minerals  1 tablet Oral Daily  . pantoprazole  40 mg Oral Daily  . polyethylene glycol  17 g Oral Daily  . saccharomyces boulardii  250 mg Oral BID  . sodium chloride flush  10-40 mL Intracatheter Q12H  .  tamsulosin  0.4 mg Oral QPC breakfast  . venlafaxine XR  75 mg Oral Q breakfast   Continuous Infusions: . sodium chloride 999 mL/hr  at 12/31/18 1027  . sodium chloride 10 mL/hr at 01/08/19 2234  . cefTRIAXone (ROCEPHIN)  IV Stopped (01/10/19 1835)     LOS: 24 days   Time spent: 25 minutes.  Patrecia Pour, MD [facility] [email or web address] Password [facility] 01/11/2019, 2:51 PM

## 2019-01-11 NOTE — Progress Notes (Signed)
Occupational Therapy Treatment Patient Details Name: Joel Greene MRN: [id number] DOB: 1951-04-13 Today's Date: 01/11/2019    History of present illness Pt is a 68 y.o. male admitted 12/18/18 with fever, chills and SOB; became progressively hypoxic and tachypnic, tested postiive for COVID-19. ETT 6/20-6/22. PMH includes OSA, IBS, DJD, glaucoma.   OT comments  Pt progressing towards established OT goals and demonstrating increased activity tolerance. Pt continues present with anxiety and fatigue with activity. Used pt's favorite music to motivate and calm during session. Pt donning his sock using figure four method in the bed; requiring increased time and effort. Pt performing short distance mobility to recliner with Min A and RW. Once sitting in recliner, pt stating "I can feel the panic coming." Encouraged calm breathes. SpO2 dropping once to 87% on RA; pt able to elevate to 90s with purse lip breathing and seated rest breaks. Continue to recommend dc to SNF and will continue to follow acutely as admitted.    Follow Up Recommendations  SNF;Supervision/Assistance - 24 hour    Equipment Recommendations  3 in 1 bedside commode    Recommendations for Other Services      Precautions / Restrictions Precautions Precautions: Fall Precaution Comments: watch vitals Restrictions Weight Bearing Restrictions: No       Mobility Bed Mobility Overal bed mobility: Needs Assistance Bed Mobility: Supine to Sit     Supine to sit: Supervision;HOB elevated     General bed mobility comments: Supervision for safety  Transfers Overall transfer level: Needs assistance Equipment used: Rolling walker (2 wheeled) Transfers: Sit to/from Stand Sit to Stand: Min assist         General transfer comment: Min A for power up into standing from EOB.     Balance Overall balance assessment: Needs assistance Sitting-balance support: Feet supported;Bilateral upper extremity supported Sitting  balance-Leahy Scale: Fair       Standing balance-Leahy Scale: Poor Standing balance comment: Reliant on UE support                           ADL either performed or assessed with clinical judgement   ADL Overall ADL's : Needs assistance/impaired                     Lower Body Dressing: Supervision/safety;Bed level Lower Body Dressing Details (indicate cue type and reason): Pt donning his socks while lying supine in bed with use of figure four technique Toilet Transfer: Minimal assistance;Ambulation;RW(simulated to recliner) Toilet Transfer Details (indicate cue type and reason): Min A for balance and support. Pt taking four steps forward and then four steps back to recliner         Functional mobility during ADLs: Minimal assistance;Rolling walker General ADL Comments: Pt demonstrating increased activity tolerance compared to prior session. Pt continues to requiring increased encouragement and presents with anxiety. Spo2 droppign to 87% once on RA and pt able to purse lip breathing adn return to 90s     Vision       Perception     Praxis      Cognition Arousal/Alertness: Awake/alert Behavior During Therapy: [facility] for tasks assessed/performed;Anxious(Slight "panic" after activity; pt using calming techniques) Overall Cognitive Status: Impaired/Different from baseline Area of Impairment: Safety/judgement;Awareness;Problem solving                         Safety/Judgement: Decreased awareness of safety;Decreased awareness of deficits Awareness: Emergent Problem Solving: Slow  processing General Comments: Pt is better spirits this session. Agreeable to OOB activity and joking with therapist. Pt verbalizing awareness of "panic" and feeling like he can't breathing. Pt using slow calming breaths to slow RR. Used jazz music for relaxation        Exercises     Shoulder Instructions       General Comments SpO2 rangining between 98-87% on RA     Pertinent Vitals/ Pain       Pain Assessment: No/denies pain  Home Living                                          Prior Functioning/Environment              Frequency  Min 2X/week        Progress Toward Goals  OT Goals(current goals can now be found in the care plan section)  Progress towards OT goals: Progressing toward goals  Acute Rehab OT Goals Patient Stated Goal: to breath better OT Goal Formulation: With patient Time For Goal Achievement: 01/20/19 Potential to Achieve Goals: Good ADL Goals Pt Will Perform Lower Body Bathing: with min assist Pt Will Perform Lower Body Dressing: with min assist Pt Will Transfer to Toilet: with min assist;bedside commode Pt Will Perform Toileting - Clothing Manipulation and hygiene: with min assist;sit to/from stand Pt Will Perform Tub/Shower Transfer: with min assist;ambulating Pt/caregiver will Perform Home Exercise Program: Both right and left upper extremity;With theraband;With written HEP provided;With Supervision Additional ADL Goal #1: Pt will independently verbalize 3 energy conservation strategies for ADL adn IADL tasks  Plan Discharge plan remains appropriate    Co-evaluation                 AM-PAC OT "6 Clicks" Daily Activity     Outcome Measure   Help from another person eating meals?: None Help from another person taking care of personal grooming?: A Little Help from another person toileting, which includes using toliet, bedpan, or urinal?: A Lot Help from another person bathing (including washing, rinsing, drying)?: A Lot Help from another person to put on and taking off regular upper body clothing?: A Little Help from another person to put on and taking off regular lower body clothing?: A Lot 6 Click Score: 16    End of Session Equipment Utilized During Treatment: Gait belt;Rolling walker  OT Visit Diagnosis: Unsteadiness on feet (R26.81);Muscle weakness (generalized)  (M62.81);Pain Pain - part of body: (chest)   Activity Tolerance Patient tolerated treatment well   Patient Left with call bell/phone within reach;in chair;with chair alarm set   Nurse Communication Mobility status;Other (comment)(O2 stats)        Time: 0240-9735 OT Time Calculation (min): 32 min  Charges: OT General Charges $OT Visit: 1 Visit OT Treatments $Self Care/Home Management : 23-37 mins  [place], OTR/L Acute Rehab Pager: [phone] Office: [place] 01/11/2019, 4:41 PM

## 2019-01-12 LAB — CBC WITH DIFFERENTIAL/PLATELET
Abs Immature Granulocytes: 0.04 10*3/uL (ref 0.00–0.07)
Basophils Absolute: 0 10*3/uL (ref 0.0–0.1)
Basophils Relative: 1 %
Eosinophils Absolute: 0.1 10*3/uL (ref 0.0–0.5)
Eosinophils Relative: 2 %
HCT: 41.9 % (ref 39.0–52.0)
Hemoglobin: 13.9 g/dL (ref 13.0–17.0)
Immature Granulocytes: 1 %
Lymphocytes Relative: 32 %
Lymphs Abs: 1 10*3/uL (ref 0.7–4.0)
MCH: 33.1 pg (ref 26.0–34.0)
MCHC: 33.2 g/dL (ref 30.0–36.0)
MCV: 99.8 fL (ref 80.0–100.0)
Monocytes Absolute: 0.3 10*3/uL (ref 0.1–1.0)
Monocytes Relative: 11 %
Neutro Abs: 1.5 10*3/uL — ABNORMAL LOW (ref 1.7–7.7)
Neutrophils Relative %: 53 %
Platelets: 186 10*3/uL (ref 150–400)
RBC: 4.2 MIL/uL — ABNORMAL LOW (ref 4.22–5.81)
RDW: 14.7 % (ref 11.5–15.5)
WBC: 3 10*3/uL — ABNORMAL LOW (ref 4.0–10.5)
nRBC: 0 % (ref 0.0–0.2)

## 2019-01-12 LAB — COMPREHENSIVE METABOLIC PANEL
ALT: 56 U/L — ABNORMAL HIGH (ref 0–44)
AST: 21 U/L (ref 15–41)
Albumin: 3.1 g/dL — ABNORMAL LOW (ref 3.5–5.0)
Alkaline Phosphatase: 83 U/L (ref 38–126)
Anion gap: 10 (ref 5–15)
BUN: 14 mg/dL (ref 8–23)
CO2: 30 mmol/L (ref 22–32)
Calcium: 9.2 mg/dL (ref 8.9–10.3)
Chloride: 99 mmol/L (ref 98–111)
Creatinine, Ser: 0.68 mg/dL (ref 0.61–1.24)
GFR calc Af Amer: >60 mL/min (ref >60)
GFR calc non Af Amer: >60 mL/min (ref >60)
Glucose, Bld: 122 mg/dL — ABNORMAL HIGH (ref 70–99)
Potassium: 4.2 mmol/L (ref 3.5–5.1)
Sodium: 139 mmol/L (ref 135–145)
Total Bilirubin: 0.4 mg/dL (ref 0.3–1.2)
Total Protein: 5.5 g/dL — ABNORMAL LOW (ref 6.5–8.1)

## 2019-01-12 LAB — D-DIMER, QUANTITATIVE: D-Dimer, Quant: 0.69 ug/mL-FEU — ABNORMAL HIGH (ref 0.00–0.50)

## 2019-01-12 LAB — GLUCOSE, CAPILLARY
Glucose-Capillary: 113 mg/dL — ABNORMAL HIGH (ref 70–99)
Glucose-Capillary: 248 mg/dL — ABNORMAL HIGH (ref 70–99)
Glucose-Capillary: 146 mg/dL — ABNORMAL HIGH (ref 70–99)
Glucose-Capillary: 187 mg/dL — ABNORMAL HIGH (ref 70–99)

## 2019-01-12 LAB — C-REACTIVE PROTEIN: CRP: 0.8 mg/dL (ref <1.0)

## 2019-01-12 MED ORDER — GABAPENTIN 300 MG PO CAPS: 300.0000 mg | ORAL_CAPSULE | Freq: Three times a day (TID) | ORAL | Status: DC

## 2019-01-12 MED ORDER — ZOLPIDEM TARTRATE 5 MG PO TABS: 10.0000 mg | ORAL_TABLET | Freq: Every evening | ORAL | Status: DC | PRN

## 2019-01-12 NOTE — Progress Notes (Signed)
[facility] PROGRESS NOTE    Progress Note  [name]  PPI:[id number] DOB: [birth year] DOA: 12/18/2018 PCP: Redmond School, MD     Brief Narrative:   Joel Greene is an 68 y.o. male past medical history of CAD, depression anxiety chronic pain presents with fever shortness of breath and was intubated and transferred to [facility] he was started on steroids and Remdesivir, he was given Actemra and convalescent plasma and subsequently extubated on 12/20/2018.  Hospital course has been complicated by extreme anxiety and superimposed streptococcal pneumonia and bacteremia  Assessment/Plan:   Acute respiratory failure with hypoxia ([place]) due to   Pneumonia due to COVID-19 virus: Patient remained significantly hypoxic with any exertion.  Not hypoxic in room air but is tenuous and desaturates with ambulation. His inflammatory markers have now normalized. Has completed a his treatment with IV Remdesivir and steroids.  He did receive Actemra and convalescent plasma.  Sepsis due to undetermined organism (HCC)/bacteremia due to pneumococcal pneumonia: Sepsis has resolved he has remained afebrile CT showed right middle and lower lobe infiltrates without complications. He was started on IV cefepime on 01/02/2019 and transition to Rocephin on 01/03/2019 which susceptibilities were confirmed with initial blood cultures. Previous physician discussed with the infectious disease doctor on 01/05/2019 that he will require 14 days of IV empiric biotic therapy. Last day 7.19.2020 Repeated blood cultures on 01/05/2019 have remained negative. PICC line was placed on 01/10/2019.  Right perinephric stranding: Incidentally noted on CT on 01/05/2019 he denies any dysuria.  Oral candidiasis: Started on mouthwash 3 times daily and Diflucan for 7 days to complete his treatment on 01/11/2019  Anxiety: Initially requiring Precedex followed by benzodiazepine which have been tapered. Continue Xanax, BuSpar  Depakote and Effexor  Acute on chronic systolic heart failure: Continue to monitor strict I's and O's Daily weight. His weight appears to be unchanged.  Dry weight seems to be around 75 kg, on 715 he weight 74 kg.  Essential hypertension: Continue hydralazine PRN.  BPH: Stable urine output continue Flomax.  Diabetes mellitus with steroid-induced hyperglycemia: His A1c was 6.5, continue Lantus plus sliding scale insulin.  Elevated LFTs: Likely due to SARS-CoV-2 now improved.  Follow-up as an outpatient  Mild thrombocytopenia: Resolved.   DVT prophylaxis: lovenox Family Communication:none Disposition Plan/Barrier to D/C: SNF in am Code Status:     Code Status Orders  (From admission, onward)         Start     Ordered   12/29/18 1025  Do not attempt resuscitation (DNR)  Continuous    Question Answer Comment  In the event of cardiac or respiratory ARREST Do not call a code blue   In the event of cardiac or respiratory ARREST Do not perform Intubation, CPR, defibrillation or ACLS   In the event of cardiac or respiratory ARREST Use medication by any route, position, wound care, and other measures to relive pain and suffering. May use oxygen, suction and manual treatment of airway obstruction as needed for comfort.      12/29/18 1024        Code Status History    Date Active Date Inactive Code Status Order ID Comments User Context   12/18/2018 1904 12/29/2018 1024 Full Code [id number]  Orson Eva, MD ED   11/25/2015 1006 11/28/2015 1859 Full Code [id number]  Erlene Quan, PA-C Inpatient   Advance Care Planning Activity        IV Access:    Peripheral IV   Procedures  and diagnostic studies:   No results found.   Medical Consultants:    None.  Anti-Infectives:   none  Subjective:    [name] relates he feels great no new complaints.  Objective:    Vitals:   01/12/19 0452 01/12/19 0459 01/12/19 0654 01/12/19 0831  BP: 126/88   122/90    Pulse: (!) 102  97 (!) 103  Resp: (!) 23  18 (!) 26  Temp: 98.2 F (36.8 C)   97.9 F (36.6 C)  TempSrc: Oral   Oral  SpO2: 100%  98% 99%  Weight:  74 kg    Height:       SpO2: 99 % O2 Flow Rate (L/min): 0 L/min FiO2 (%): 45 %   Intake/Output Summary (Last 24 hours) at 01/12/2019 0915 Last data filed at 01/12/2019 0907 Gross per 24 hour  Intake 800 ml  Output 1850 ml  Net -1050 ml   Filed Weights   01/09/19 0637 01/11/19 0446 01/12/19 0459  Weight: 72.1 kg 76.7 kg 74 kg    Exam: General exam: In no acute distress. Respiratory system: Good air movement and clear to auscultation. Cardiovascular system: S1 & S2 heard, RRR. No JVD. Gastrointestinal system: Abdomen is nondistended, soft and nontender.  Central nervous system: Alert and oriented. No focal neurological deficits. Extremities: No pedal edema. Skin: No rashes, lesions or ulcers Psychiatry: Judgement and insight appear normal. Mood & affect appropriate.    Data Reviewed:    Labs: Basic Metabolic Panel: Recent Labs  Lab 01/06/19 0500 01/07/19 0500 01/10/19 0200 01/12/19 0255  NA 138 139 137 139  K 4.0 3.8 4.4 4.2  CL 97* 98 98 99  CO2 28 26 28 30   GLUCOSE 164* 129* 132* 122*  BUN 21 19 16 14   CREATININE 0.61 0.61 0.61 0.68  CALCIUM 8.6* 9.1 9.2 9.2   GFR Estimated Creatinine Clearance: 81.5 mL/min (by C-G formula based on SCr of 0.68 mg/dL). Liver Function Tests: Recent Labs  Lab 01/06/19 0500 01/07/19 0500 01/10/19 0200 01/12/19 0255  AST 40 44* 28 21  ALT 167* 167* 76* 56*  ALKPHOS 97 112 90 83  BILITOT 0.3 0.2* 0.5 0.4  PROT 4.8* 5.6* 5.4* 5.5*  ALBUMIN 2.5* 3.0* 2.9* 3.1*   No results for input(s): LIPASE, AMYLASE in the last 168 hours. No results for input(s): AMMONIA in the last 168 hours. Coagulation profile No results for input(s): INR, PROTIME in the last 168 hours. COVID-19 Labs  Recent Labs    01/10/19 0200 01/12/19 0255  DDIMER  --  0.69*  CRP <0.8 0.8    Lab  Results  Component Value Date   SARSCOV2NAA POSITIVE (A) 12/18/2018    CBC: Recent Labs  Lab 01/06/19 0500 01/07/19 0500 01/10/19 0200 01/12/19 0255  WBC 4.0 3.6* 3.7* 3.0*  NEUTROABS  --   --  2.3 1.5*  HGB 12.0* 14.3 14.2 13.9  HCT 36.9* 43.8 43.0 41.9  MCV 98.9 98.4 99.5 99.8  PLT 132* 147* 162 186   Cardiac Enzymes: No results for input(s): CKTOTAL, CKMB, CKMBINDEX, TROPONINI in the last 168 hours. BNP (last 3 results) No results for input(s): PROBNP in the last 8760 hours. CBG: Recent Labs  Lab 01/11/19 0643 01/11/19 1146 01/11/19 1621 01/11/19 2309 01/12/19 0830  GLUCAP 115* 119* 194* 197* 113*   D-Dimer: Recent Labs    01/12/19 0255  DDIMER 0.69*   Hgb A1c: No results for input(s): HGBA1C in the last 72 hours. Lipid Profile: No  results for input(s): CHOL, HDL, LDLCALC, TRIG, CHOLHDL, LDLDIRECT in the last 72 hours. Thyroid function studies: No results for input(s): TSH, T4TOTAL, T3FREE, THYROIDAB in the last 72 hours.  Invalid input(s): FREET3 Anemia work up: No results for input(s): VITAMINB12, FOLATE, FERRITIN, TIBC, IRON, RETICCTPCT in the last 72 hours. Sepsis Labs: Recent Labs  Lab 01/06/19 0500 01/07/19 0500 01/10/19 0200 01/12/19 0255  WBC 4.0 3.6* 3.7* 3.0*   Microbiology Recent Results (from the past 240 hour(s))  Culture, blood (routine x 2)     Status: Abnormal   Collection Time: 01/02/19 11:40 AM   Specimen: BLOOD  Result Value Ref Range Status   Specimen Description   Final    BLOOD LEFT ANTECUBITAL Performed at [facility], [place] [address], [place], [place] [address]    Special Requests   Final    BOTTLES DRAWN AEROBIC ONLY Blood Culture adequate volume Performed at [facility] [address], [place], [place] [address]    Culture  Setup Time   Final    GRAM POSITIVE COCCI AEROBIC BOTTLE ONLY CRITICAL VALUE NOTED.  VALUE IS CONSISTENT WITH PREVIOUSLY REPORTED AND CALLED VALUE.     Culture (A)  Final    STREPTOCOCCUS PNEUMONIAE SUSCEPTIBILITIES PERFORMED ON PREVIOUS CULTURE WITHIN THE LAST 5 DAYS. Performed at [facility], [place] [address], [place], [place] [address]    Report Status 01/05/2019 FINAL  Final  Culture, blood (routine x 2)     Status: Abnormal   Collection Time: 01/02/19 11:45 AM   Specimen: BLOOD  Result Value Ref Range Status   Specimen Description   Final    BLOOD LEFT HAND Performed at [facility] [facility] [address], [place], [place] [address]    Special Requests   Final    BOTTLES DRAWN AEROBIC ONLY Blood Culture adequate volume Performed at [facility] [address] [place], [place] [address]    Culture  Setup Time   Final    GRAM POSITIVE COCCI AEROBIC BOTTLE ONLY CRITICAL RESULT CALLED TO, READ BACK BY AND VERIFIED WITH: Jene Every PharmD 9:40 01/03/19 ([email or web address]) Performed at [facility], [place] [address], [place],  [address]    Culture STREPTOCOCCUS PNEUMONIAE (A)  Final   Report Status 01/05/2019 FINAL  Final   Organism ID, Bacteria STREPTOCOCCUS PNEUMONIAE  Final      Susceptibility   Streptococcus pneumoniae - MIC*    ERYTHROMYCIN >=8 RESISTANT Resistant     LEVOFLOXACIN 1 SENSITIVE Sensitive     VANCOMYCIN 0.5 SENSITIVE Sensitive     PENICILLIN (non-meningitis) <=0.06 SENSITIVE Sensitive     CEFTRIAXONE (non-meningitis) <=0.12 SENSITIVE Sensitive     * STREPTOCOCCUS PNEUMONIAE  Blood Culture ID Panel (Reflexed)     Status: Abnormal   Collection Time: 01/02/19 11:45 AM  Result Value Ref Range Status   Enterococcus species NOT DETECTED NOT DETECTED Final   Listeria monocytogenes NOT DETECTED NOT DETECTED Final   Staphylococcus species NOT DETECTED NOT DETECTED Final   Staphylococcus aureus (BCID) NOT DETECTED NOT DETECTED Final   Streptococcus species DETECTED (A) NOT DETECTED Final    Comment: CRITICAL RESULT CALLED TO, READ BACK BY AND VERIFIED WITH: Jene Every PharmD 9:40  01/03/19 ([email or web address])    Streptococcus agalactiae NOT DETECTED NOT DETECTED Final   Streptococcus pneumoniae DETECTED (A) NOT DETECTED Final    Comment: CRITICAL RESULT CALLED TO, READ BACK BY AND VERIFIED WITH: Jene Every PharmD 9:40 01/03/19 ([email or web address])  Streptococcus pyogenes NOT DETECTED NOT DETECTED Final   Acinetobacter baumannii NOT DETECTED NOT DETECTED Final   Enterobacteriaceae species NOT DETECTED NOT DETECTED Final   Enterobacter cloacae complex NOT DETECTED NOT DETECTED Final   Escherichia coli NOT DETECTED NOT DETECTED Final   Klebsiella oxytoca NOT DETECTED NOT DETECTED Final   Klebsiella pneumoniae NOT DETECTED NOT DETECTED Final   Proteus species NOT DETECTED NOT DETECTED Final   Serratia marcescens NOT DETECTED NOT DETECTED Final   Haemophilus influenzae NOT DETECTED NOT DETECTED Final   Neisseria meningitidis NOT DETECTED NOT DETECTED Final   Pseudomonas aeruginosa NOT DETECTED NOT DETECTED Final   Candida albicans NOT DETECTED NOT DETECTED Final   Candida glabrata NOT DETECTED NOT DETECTED Final   Candida krusei NOT DETECTED NOT DETECTED Final   Candida parapsilosis NOT DETECTED NOT DETECTED Final   Candida tropicalis NOT DETECTED NOT DETECTED Final    Comment: Performed at [facility], [place] [address] [place], [place] [address]  Culture, blood (routine x 2)     Status: None   Collection Time: 01/05/19  4:30 PM   Specimen: BLOOD  Result Value Ref Range Status   Specimen Description   Final    BLOOD LEFT ARM Performed at [facility] [address], [place], [place] [address]    Special Requests   Final    BOTTLES DRAWN AEROBIC AND ANAEROBIC Blood Culture adequate volume Performed at [facility] [address], [place], [place] [address]    Culture   Final    NO GROWTH 5 DAYS Performed at [facility], [place] [address] [place], [place] [address]    Report Status 01/10/2019 FINAL  Final  Culture, blood (routine x  2)     Status: None   Collection Time: 01/05/19  4:38 PM   Specimen: BLOOD  Result Value Ref Range Status   Specimen Description   Final    BLOOD LEFT HAND Performed at [facility] [address] [place], [place] [address]    Special Requests   Final    BOTTLES DRAWN AEROBIC AND ANAEROBIC Blood Culture adequate volume Performed at [facility] [address], [place], [place] [address]    Culture   Final    NO GROWTH 5 DAYS Performed at [facility], [place] [address] [place], [place] [address]    Report Status 01/10/2019 FINAL  Final     Medications:    aspirin  81 mg Oral Daily   atorvastatin  20 mg Oral Daily   busPIRone  15 mg Oral TID   cholecalciferol  5,000 Units Oral Daily   clonazepam  1 mg Oral BID   divalproex  250 mg Oral Q12H   enoxaparin (LOVENOX) injection  40 mg Subcutaneous Q24H   feeding supplement (ENSURE ENLIVE)  237 mL Oral TID BM   fluticasone  2 spray Each Nare Daily   gabapentin  300 mg Oral 2 times per day   And   gabapentin  600 mg Oral QHS   insulin aspart  0-9 Units Subcutaneous TID WC   insulin glargine  10 Units Subcutaneous Daily   latanoprost  1 drop Both Eyes QHS   magic mouthwash w/lidocaine  15 mL Oral TID   mouth rinse  15 mL Mouth Rinse BID   multivitamin with minerals  1 tablet Oral Daily   pantoprazole  40 mg Oral Daily   polyethylene glycol  17 g  Oral Daily   saccharomyces boulardii  250 mg Oral BID   sodium chloride flush  10-40 mL Intracatheter Q12H   tamsulosin  0.4 mg Oral QPC breakfast   venlafaxine XR  75 mg Oral Q breakfast   Continuous Infusions:  sodium chloride 999 mL/hr at 12/31/18 1027   sodium chloride 10 mL/hr at 01/08/19 2234   cefTRIAXone (ROCEPHIN)  IV Stopped (01/11/19 1820)      LOS: 25 days   Charlynne Cousins  [facility]  01/12/2019, 9:15 AM

## 2019-01-12 NOTE — Progress Notes (Signed)
Physical Therapy Treatment Patient Details Name: Joel Greene MRN: [id number] DOB: 1951/04/23 Today's Date: 01/12/2019    History of Present Illness Pt is a 68 y.o. male admitted 12/18/18 with fever, chills and SOB; became progressively hypoxic and tachypnic, tested postiive for COVID-19. ETT 6/20-6/22. PMH includes OSA, IBS, DJD, glaucoma.    PT Comments    The patient gets anxious when speaking about ambulation and encouraged  To do his best. Patient requires rest breaks  Between transitions of mobility. Patient on RA for first short walk, sats remained in 90's. After second 12' walk, dropped down to 85% so placed on 2 L.with recovery within ~ 4 minutes. HR 129 . Noted  Labored breathing.Patient states" I need air. Lay me back". Patient recovered within 4 minutes back to 95%.Able to speak and RR returned to WNL. HR remained in 120's. Patient wanted to attempt another walk, sat up in recliner but became SOB and asked to be laid back in recliner. Remained on 2 liters for 5 minutes, Patient removed O2 and noted to be satting about 89-90%, RN in room. HR remaining 121-125. Continue PT.sign   Follow Up Recommendations  SNF     Equipment Recommendations    none   Recommendations for Other Services       Precautions / Restrictions Precautions Precautions: Fall Precaution Comments: watch vitals    Mobility  Bed Mobility Overal bed mobility: Needs Assistance Bed Mobility: Rolling;Sidelying to Sit Rolling: Min guard Sidelying to sit: Min guard       General bed mobility comments: encouraged to roll to decrease efforts  for sitting up.  Transfers Overall transfer level: Needs assistance Equipment used: Rolling walker (2 wheeled) Transfers: Sit to/from Stand Sit to Stand: Min assist         General transfer comment: Min guard A for power up into standing from EOB and recliner.  Ambulation/Gait Ambulation/Gait assistance: Min assist;+2 safety/equipment Gait Distance (Feet):  5 Feet(then 12') Assistive device: Rolling walker (2 wheeled) Gait Pattern/deviations: Step-through pattern;Staggering left;Staggering right Gait velocity: fast   General Gait Details: Patient reports that he needs air after a few feet. Sat ad rested then got up and ambulated 12'. Patient on RA ub=ntil after second walk, placed on 2 liters for recovery. HR 128, Sats down to 85% on RA with ambulation   Stairs             Wheelchair Mobility    Modified Rankin (Stroke Patients Only)       Balance                                            Cognition Arousal/Alertness: Awake/alert Behavior During Therapy: Anxious;Impulsive(when SOB) Overall Cognitive Status: Impaired/Different from baseline Area of Impairment: Safety/judgement;Awareness;Problem solving                           Awareness: Emergent   General Comments: patient anxious about ambulation, states that he dreads it. Encouraged pt. to slow and deep breathe and think of nice thoughts, played his  favorite music while in room.      Exercises      General Comments        Pertinent Vitals/Pain Faces Pain Scale: Hurts little more Pain Location: chest with breathing Pain Descriptors / Indicators: Discomfort Pain Intervention(s): Monitored during session    Home  Living                      Prior Function            PT Goals (current goals can now be found in the care plan section) Progress towards PT goals: Progressing toward goals    Frequency    Min 3X/week      PT Plan Current plan remains appropriate    Co-evaluation              AM-PAC PT "6 Clicks" Mobility   Outcome Measure  Help needed turning from your back to your side while in a flat bed without using bedrails?: A Little Help needed moving from lying on your back to sitting on the side of a flat bed without using bedrails?: A Little Help needed moving to and from a bed to a chair  (including a wheelchair)?: A Little Help needed standing up from a chair using your arms (e.g., wheelchair or bedside chair)?: A Lot Help needed to walk in hospital room?: A Lot Help needed climbing 3-5 steps with a railing? : Total 6 Click Score: 14    End of Session Equipment Utilized During Treatment: Gait belt;Oxygen Activity Tolerance: Treatment limited secondary to medical complications (Comment) Patient left: in chair;with call bell/phone within reach Nurse Communication: Mobility status PT Visit Diagnosis: Difficulty in walking, not elsewhere classified (R26.2)     Time: 1460-4799 PT Time Calculation (min) (ACUTE ONLY): 57 min  Charges:  $Gait Training: 53-67 mins               [place]  Office [phone]    [name] 01/12/2019, 5:21 PM

## 2019-01-12 NOTE — Progress Notes (Signed)
MD placed order for Pt to Prone, Pt advised to Prone per order

## 2019-01-12 NOTE — Progress Notes (Signed)
Phoned Pt's brother, no answer, Audry Pili identified self on VM, advised pt is doing fine, and arranging for tx to SNF tomorrow.

## 2019-01-12 NOTE — NC FL2 (Signed)
[place] LEVEL OF CARE SCREENING TOOL     IDENTIFICATION  Patient Name: Joel Greene Birthdate: 1951/04/21 Sex: male Admission Date (Current Location): 12/18/2018  [place] and [place] Number:  [employer] and Address:  The . [facility], [place] [address] [place], [place] [address](GVC)      Provider Number: [id number]  Attending Physician Name and Address:  Charlynne Cousins, MD  Relative Name and Phone Number:  Audry Pili ([id number])[phone]    Current Level of Care: Hospital Recommended Level of Care: [place] Prior Approval Number:    Date Approved/Denied:   PASRR Number: [id number]  Discharge Plan: SNF    Current Diagnoses: Patient Active Problem List   Diagnosis Date Noted  . Encounter for intubation   . History of ETT   . Hypokalemia 12/19/2018  . Acute respiratory failure with hypoxia ([place]) 12/18/2018  . Sepsis due to undetermined organism ([place]) 12/18/2018  . Pneumonia due to COVID-19 virus 12/18/2018  . Acute respiratory failure with hypoxemia ([place]) 12/18/2018  . Non-cardiac chest pain 11/28/2015  . CVA tenderness 11/27/2015  . Acute coronary syndrome ([place])   . Elevated troponin 11/25/2015  . SOB (shortness of breath) 11/25/2015  . PUD (peptic ulcer disease) 11/25/2015  . Uncontrolled hypertension 11/25/2015  . DJD- spine injection 11/07/15 11/25/2015  . Bilateral flank pain 11/25/2015  . Nephrolithiasis 11/25/2015  . Anxiety 11/25/2015  . GERD (gastroesophageal reflux disease) 07/31/2015  . Abdominal pain 06/07/2015  . Leukopenia 10/23/2011  . History of tobacco abuse 10/23/2011    Orientation RESPIRATION BLADDER Height & Weight     Self, Time, Situation, Place  Normal Continent Weight: 163 lb 3.2 oz (74 kg) Height:  5\' 7"  (170.2 cm)  BEHAVIORAL SYMPTOMS/MOOD NEUROLOGICAL BOWEL NUTRITION STATUS      Continent Diet(see discharge summary)  AMBULATORY STATUS COMMUNICATION OF NEEDS Skin    Extensive Assist(2 person assist) Verbally                         Personal Care Assistance Level of Assistance  Bathing, Feeding, Dressing, Total care Bathing Assistance: Limited assistance Feeding assistance: Independent Dressing Assistance: Limited assistance Total Care Assistance: Limited assistance   Functional Limitations Info  Sight, Hearing, Speech Sight Info: Impaired Hearing Info: Adequate Speech Info: Adequate    SPECIAL CARE FACTORS FREQUENCY  PT (By licensed PT), OT (By licensed OT)     PT Frequency: min 5x weekly OT Frequency: min 5x weekly            Contractures Contractures Info: Not present    Additional Factors Info  Code Status, Allergies, Isolation Precautions Code Status Info: DNR Allergies Info: Clams (shellfish allergy), Demeclocycline, tetracyclines and related     Isolation Precautions Info: air and contact precautions, emerging pathogen     Current Medications (01/12/2019):  This is the current hospital active medication list Current Facility-Administered Medications  Medication Dose Route Frequency Provider Last Rate Last Dose  . 0.9 %  sodium chloride infusion   Intravenous Continuous Thurnell Lose, MD 999 mL/hr at 12/31/18 1027    . 0.9 %  sodium chloride infusion   Intravenous Continuous Jonetta Osgood, MD 10 mL/hr at 01/08/19 2234    . acetaminophen (TYLENOL) tablet 650 mg  650 mg Oral Q6H PRN Kathie Dike, MD   650 mg at 01/12/19 0141  . aspirin chewable tablet 81 mg  81 mg Oral Daily Kathie Dike, MD   81  mg at 01/12/19 0856  . atorvastatin (LIPITOR) tablet 20 mg  20 mg Oral Daily Kathie Dike, MD   20 mg at 01/12/19 0855  . busPIRone (BUSPAR) tablet 15 mg  15 mg Oral TID Jonetta Osgood, MD   15 mg at 01/12/19 0855  . cefTRIAXone (ROCEPHIN) 2 g in sodium chloride 0.9 % 100 mL IVPB  2 g Intravenous Q24H Patrecia Pour, MD   Stopped at 01/11/19 1820  . cholecalciferol (VITAMIN D) tablet 5,000 Units  5,000 Units  Oral Daily Tat, David, MD   5,000 Units at 01/12/19 0856  . clonazePAM (KLONOPIN) disintegrating tablet 1 mg  1 mg Oral BID Thurnell Lose, MD   1 mg at 01/12/19 0855  . cyclobenzaprine (FLEXERIL) tablet 5 mg  5 mg Oral TID PRN Thurnell Lose, MD   5 mg at 01/04/19 1000  . diphenhydrAMINE (BENADRYL) capsule 25 mg  25 mg Oral Q6H PRN Jonetta Osgood, MD   25 mg at 01/04/19 1157  . divalproex (DEPAKOTE) DR tablet 250 mg  250 mg Oral Q12H Simonne Maffucci B, MD   250 mg at 01/12/19 0856  . enoxaparin (LOVENOX) injection 40 mg  40 mg Subcutaneous Q24H Patrecia Pour, MD   40 mg at 01/12/19 0854  . feeding supplement (ENSURE ENLIVE) (ENSURE ENLIVE) liquid 237 mL  237 mL Oral TID BM Thurnell Lose, MD   237 mL at 01/12/19 0906  . fluticasone (FLONASE) 50 MCG/ACT nasal spray 2 spray  2 spray Each Nare Daily Kathie Dike, MD   2 spray at 01/12/19 0903  . gabapentin (NEURONTIN) capsule 300 mg  300 mg Oral 2 times per day Kathie Dike, MD   300 mg at 01/12/19 4462   And  . gabapentin (NEURONTIN) capsule 600 mg  600 mg Oral QHS Kathie Dike, MD   600 mg at 01/11/19 2115  . hydrALAZINE (APRESOLINE) injection 10 mg  10 mg Intravenous Q6H PRN Thurnell Lose, MD   10 mg at 01/06/19 2233  . HYDROcodone-acetaminophen (NORCO/VICODIN) 5-325 MG per tablet 1 tablet  1 tablet Oral Q6H PRN Thurnell Lose, MD   1 tablet at 01/09/19 2150  . insulin aspart (novoLOG) injection 0-9 Units  0-9 Units Subcutaneous TID WC Thurnell Lose, MD   2 Units at 01/11/19 1742  . insulin glargine (LANTUS) injection 10 Units  10 Units Subcutaneous Daily Thurnell Lose, MD   10 Units at 01/12/19 0906  . latanoprost (XALATAN) 0.005 % ophthalmic solution 1 drop  1 drop Both Eyes QHS Orson Eva, MD   1 drop at 01/11/19 2054  . lip balm (CARMEX) ointment   Topical PRN Jonetta Osgood, MD      . loperamide (IMODIUM) capsule 2 mg  2 mg Oral PRN Skipper Cliche A, MD   2 mg at 12/21/18 1802  . LORazepam (ATIVAN)  injection 1 mg  1 mg Intravenous Q4H PRN Edwin Dada, MD   1 mg at 01/06/19 2345  . magic mouthwash w/lidocaine  15 mL Oral TID Patrecia Pour, MD   15 mL at 01/12/19 0857  . MEDLINE mouth rinse  15 mL Mouth Rinse BID Kathie Dike, MD   15 mL at 01/12/19 0907  . multivitamin with minerals tablet 1 tablet  1 tablet Oral Daily Kathie Dike, MD   1 tablet at 01/12/19 0856  . ondansetron (ZOFRAN) injection 4 mg  4 mg Intravenous Q6H PRN Orson Eva, MD  4 mg at 12/23/18 1059  . pantoprazole (PROTONIX) EC tablet 40 mg  40 mg Oral Daily Kathie Dike, MD   40 mg at 01/12/19 0856  . pneumococcal 23 valent vaccine (PNU-IMMUNE) injection 0.5 mL  0.5 mL Intramuscular Prior to discharge Kathie Dike, MD      . polyethylene glycol (MIRALAX / GLYCOLAX) packet 17 g  17 g Oral Daily Thurnell Lose, MD   17 g at 01/05/19 1000  . polyvinyl alcohol (LIQUIFILM TEARS) 1.4 % ophthalmic solution   Both Eyes Daily PRN Tat, Shanon Brow, MD      . saccharomyces boulardii (FLORASTOR) capsule 250 mg  250 mg Oral BID Kathie Dike, MD   250 mg at 01/12/19 0856  . sodium chloride flush (NS) 0.9 % injection 10-40 mL  10-40 mL Intracatheter Q12H Patrecia Pour, MD   10 mL at 01/12/19 0907  . sodium chloride flush (NS) 0.9 % injection 10-40 mL  10-40 mL Intracatheter PRN Patrecia Pour, MD   10 mL at 01/11/19 2115  . tamsulosin (FLOMAX) capsule 0.4 mg  0.4 mg Oral QPC breakfast Tat, David, MD   0.4 mg at 01/12/19 0856  . venlafaxine XR (EFFEXOR-XR) 24 hr capsule 75 mg  75 mg Oral Q breakfast Kathie Dike, MD   75 mg at 01/12/19 0855  . zolpidem (AMBIEN) tablet 5 mg  5 mg Oral QHS PRN Thurnell Lose, MD   5 mg at 01/12/19 0141     Discharge Medications: Please see discharge summary for a list of discharge medications.  Relevant Imaging Results:  Relevant Lab Results:   Additional Information SSN: [id number]  Alberteen Sam, LCSW

## 2019-01-12 NOTE — TOC Initial Note (Signed)
Transition of Care [facility]) - Initial/Assessment Note    Patient Details  Name: Joel Greene MRN: [id number] Date of Birth: 1951-05-10  Transition of Care [facility]) CM/SW Contact:    Alberteen Sam,  Phone Number: [phone] 01/12/2019, 9:36 AM  Clinical Narrative:                  CSW consulted with patient regarding discharge planning, CSW informed patient of bed offer with [facility] with bed avail tomorrow. Patient agreeable to [facility] and reports he prefers to stay in [place]. Patient reports he can call and update family and identifies no further needs at this time.   Plan for patient to discharge to [facility] tomorrow if medically stable.   Expected Discharge Plan: Skilled Nursing Facility Barriers to Discharge: Continued Medical Work up   Patient Goals and CMS Choice Patient states their goals for this hospitalization and ongoing recovery are:: to go to rehab then home CMS Medicare.gov Compare Post Acute Care list provided to:: Patient Choice offered to / list presented to : Patient  Expected Discharge Plan and Services Expected Discharge Plan: [place] Choice: [place] Living arrangements for the past 2 months: Single Family Home Expected Discharge Date: 12/28/18                                    Prior Living Arrangements/Services Living arrangements for the past 2 months: Single Family Home Lives with:: Self Patient language and need for interpreter reviewed:: Yes Do you feel safe going back to the place where you live?: No   needs short term rehab before returning home  Need for Family Participation in Patient Care: No (Comment) Care giver support system in place?: No (comment)   Criminal Activity/Legal Involvement Pertinent to Current Situation/Hospitalization: No - Comment as needed  Activities of Daily Living Home Assistive Devices/Equipment: CPAP ADL Screening (condition at time of  admission) Patient's cognitive ability adequate to safely complete daily activities?: Yes Is the patient deaf or have difficulty hearing?: Yes(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) Does the patient have difficulty seeing, even when wearing glasses/contacts?: No Does the patient have difficulty concentrating, remembering, or making decisions?: No Patient able to express need for assistance with ADLs?: Yes Does the patient have difficulty dressing or bathing?: No Independently performs ADLs?: Yes (appropriate for developmental age) Does the patient have difficulty walking or climbing stairs?: No Weakness of Legs: Both Weakness of Arms/Hands: Both  Permission Sought/Granted Permission sought to share information with : Case Manager, Chartered certified accountant granted to share information with : Yes, Verbal Permission Granted     Permission granted to share info w AGENCY: SNFs        Emotional Assessment Appearance:: Other (Comment Required(unable to assess - remote) Attitude/Demeanor/Rapport: Gracious Affect (typically observed): Calm Orientation: : Oriented to Self, Oriented to Place, Oriented to  Time, Oriented to Situation Alcohol / Substance Use: Not Applicable Psych Involvement: No (comment)  Admission diagnosis:  SOB (shortness of breath) [R06.02] Encounter for intubation [Z01.818] Acute respiratory failure with hypoxia ([place]) [J96.01] Pneumonia due to COVID-19 virus [U07.1, J12.89] Respiratory failure with hypoxia ([place]) [J96.91] Patient Active Problem List   Diagnosis Date Noted  . Encounter for intubation   . History of ETT   . Hypokalemia 12/19/2018  . Acute respiratory failure with hypoxia ([place]) 12/18/2018  . Sepsis due to undetermined organism (  [place]) 12/18/2018  . Pneumonia due to COVID-19 virus 12/18/2018  . Acute respiratory failure with hypoxemia ([place]) 12/18/2018  . Non-cardiac chest pain 11/28/2015  . CVA tenderness 11/27/2015  . Acute coronary syndrome ([place])   .  Elevated troponin 11/25/2015  . SOB (shortness of breath) 11/25/2015  . PUD (peptic ulcer disease) 11/25/2015  . Uncontrolled hypertension 11/25/2015  . DJD- spine injection 11/07/15 11/25/2015  . Bilateral flank pain 11/25/2015  . Nephrolithiasis 11/25/2015  . Anxiety 11/25/2015  . GERD (gastroesophageal reflux disease) 07/31/2015  . Abdominal pain 06/07/2015  . Leukopenia 10/23/2011  . History of tobacco abuse 10/23/2011   PCP:  Redmond School, MD Pharmacy:   [place], [place] [address] [place] [address] Phone: [phone] Fax: [place], [place] [address] [place] [address] Phone: [phone] Fax: [phone]     Social Determinants of Health (SDOH) Interventions    Readmission Risk Interventions No flowsheet data found.

## 2019-01-13 DIAGNOSIS — Z95828 Presence of other vascular implants and grafts: Secondary | ICD-10-CM | POA: Diagnosis not present

## 2019-01-13 DIAGNOSIS — K219 Gastro-esophageal reflux disease without esophagitis: Secondary | ICD-10-CM | POA: Diagnosis present

## 2019-01-13 DIAGNOSIS — J189 Pneumonia, unspecified organism: Secondary | ICD-10-CM | POA: Diagnosis not present

## 2019-01-13 DIAGNOSIS — F3489 Other specified persistent mood disorders: Secondary | ICD-10-CM | POA: Diagnosis not present

## 2019-01-13 DIAGNOSIS — Z7982 Long term (current) use of aspirin: Secondary | ICD-10-CM | POA: Diagnosis not present

## 2019-01-13 DIAGNOSIS — J1289 Other viral pneumonia: Secondary | ICD-10-CM | POA: Diagnosis present

## 2019-01-13 DIAGNOSIS — E7849 Other hyperlipidemia: Secondary | ICD-10-CM | POA: Diagnosis not present

## 2019-01-13 DIAGNOSIS — M6281 Muscle weakness (generalized): Secondary | ICD-10-CM | POA: Diagnosis not present

## 2019-01-13 DIAGNOSIS — R0600 Dyspnea, unspecified: Secondary | ICD-10-CM | POA: Diagnosis not present

## 2019-01-13 DIAGNOSIS — A4189 Other specified sepsis: Secondary | ICD-10-CM | POA: Diagnosis not present

## 2019-01-13 DIAGNOSIS — F329 Major depressive disorder, single episode, unspecified: Secondary | ICD-10-CM | POA: Diagnosis present

## 2019-01-13 DIAGNOSIS — J188 Other pneumonia, unspecified organism: Secondary | ICD-10-CM | POA: Diagnosis not present

## 2019-01-13 DIAGNOSIS — R7881 Bacteremia: Secondary | ICD-10-CM

## 2019-01-13 DIAGNOSIS — I5032 Chronic diastolic (congestive) heart failure: Secondary | ICD-10-CM | POA: Diagnosis not present

## 2019-01-13 DIAGNOSIS — R0902 Hypoxemia: Secondary | ICD-10-CM | POA: Diagnosis not present

## 2019-01-13 DIAGNOSIS — R2689 Other abnormalities of gait and mobility: Secondary | ICD-10-CM | POA: Diagnosis not present

## 2019-01-13 DIAGNOSIS — E785 Hyperlipidemia, unspecified: Secondary | ICD-10-CM | POA: Diagnosis present

## 2019-01-13 DIAGNOSIS — R2681 Unsteadiness on feet: Secondary | ICD-10-CM | POA: Diagnosis not present

## 2019-01-13 DIAGNOSIS — Z01818 Encounter for other preprocedural examination: Secondary | ICD-10-CM | POA: Diagnosis not present

## 2019-01-13 DIAGNOSIS — R000 Tachycardia, unspecified: Secondary | ICD-10-CM | POA: Diagnosis not present

## 2019-01-13 DIAGNOSIS — Z7401 Bed confinement status: Secondary | ICD-10-CM | POA: Diagnosis not present

## 2019-01-13 DIAGNOSIS — I10 Essential (primary) hypertension: Secondary | ICD-10-CM | POA: Diagnosis present

## 2019-01-13 DIAGNOSIS — E876 Hypokalemia: Secondary | ICD-10-CM | POA: Diagnosis present

## 2019-01-13 DIAGNOSIS — M2550 Pain in unspecified joint: Secondary | ICD-10-CM | POA: Diagnosis not present

## 2019-01-13 DIAGNOSIS — R6520 Severe sepsis without septic shock: Secondary | ICD-10-CM | POA: Diagnosis not present

## 2019-01-13 DIAGNOSIS — Z8249 Family history of ischemic heart disease and other diseases of the circulatory system: Secondary | ICD-10-CM | POA: Diagnosis not present

## 2019-01-13 DIAGNOSIS — J9600 Acute respiratory failure, unspecified whether with hypoxia or hypercapnia: Secondary | ICD-10-CM | POA: Diagnosis not present

## 2019-01-13 DIAGNOSIS — R918 Other nonspecific abnormal finding of lung field: Secondary | ICD-10-CM | POA: Diagnosis not present

## 2019-01-13 DIAGNOSIS — R0602 Shortness of breath: Secondary | ICD-10-CM | POA: Diagnosis not present

## 2019-01-13 DIAGNOSIS — E119 Type 2 diabetes mellitus without complications: Secondary | ICD-10-CM | POA: Diagnosis not present

## 2019-01-13 DIAGNOSIS — U071 COVID-19: Secondary | ICD-10-CM | POA: Diagnosis present

## 2019-01-13 DIAGNOSIS — Z87891 Personal history of nicotine dependence: Secondary | ICD-10-CM | POA: Diagnosis not present

## 2019-01-13 DIAGNOSIS — G478 Other sleep disorders: Secondary | ICD-10-CM | POA: Diagnosis not present

## 2019-01-13 DIAGNOSIS — A419 Sepsis, unspecified organism: Secondary | ICD-10-CM | POA: Diagnosis not present

## 2019-01-13 DIAGNOSIS — J9601 Acute respiratory failure with hypoxia: Secondary | ICD-10-CM | POA: Diagnosis present

## 2019-01-13 DIAGNOSIS — J168 Pneumonia due to other specified infectious organisms: Secondary | ICD-10-CM | POA: Diagnosis not present

## 2019-01-13 DIAGNOSIS — R498 Other voice and resonance disorders: Secondary | ICD-10-CM | POA: Diagnosis not present

## 2019-01-13 DIAGNOSIS — F418 Other specified anxiety disorders: Secondary | ICD-10-CM | POA: Diagnosis not present

## 2019-01-13 DIAGNOSIS — F419 Anxiety disorder, unspecified: Secondary | ICD-10-CM | POA: Diagnosis present

## 2019-01-13 LAB — GLUCOSE, CAPILLARY
Glucose-Capillary: 115 mg/dL — ABNORMAL HIGH (ref 70–99)
Glucose-Capillary: 150 mg/dL — ABNORMAL HIGH (ref 70–99)
Glucose-Capillary: 193 mg/dL — ABNORMAL HIGH (ref 70–99)

## 2019-01-13 MED ORDER — METFORMIN HCL 500 MG PO TABS: 500.0000 mg | ORAL_TABLET | Freq: Two times a day (BID) | ORAL | 11 refills | Status: DC

## 2019-01-13 MED ORDER — SODIUM CHLORIDE 0.9 % IV SOLN: 2.0000 g | INTRAVENOUS | Status: DC

## 2019-01-13 MED ORDER — CYCLOBENZAPRINE HCL 10 MG PO TABS: 10.0000 mg | ORAL_TABLET | Freq: Three times a day (TID) | ORAL | 0 refills | Status: DC | PRN

## 2019-01-13 MED ORDER — OXYCODONE HCL 10 MG PO TABS: 10.0000 mg | ORAL_TABLET | Freq: Four times a day (QID) | ORAL | 0 refills | Status: AC | PRN

## 2019-01-13 NOTE — Care Management Important Message (Signed)
Important Message  Patient Details  Name: Joel Greene MRN: [id number] Date of Birth: 1950-07-24   Medicare Important Message Given:  Yes - Important Message mailed due to current National Emergency   Verbal consent obtained due to current National Emergency  Relationship to patient: Family Member Contact Name: Syair Fricker Call Date: 01/13/19  Time: 1541 Phone: [phone] Outcome: No Answer/Busy Important Message mailed to: Emergency contact on file     Tommy Medal 01/13/2019, 3:42 PM

## 2019-01-13 NOTE — Consult Note (Signed)
   [facility] Inpatient Consult   01/13/2019  Joel Greene [birth year] [id number]   Patient screened for disposition needs in the Medicare NGACO.and for long length of stay. Review of patient's medical record reveals from MD brief narrative note 01/11/2019 as the  Patient is a53 y.o.malewith PMHx of CAD, depression with anxiety, chronic pain-presented with fever, shortness of breath-he unfortunately became progressively hypoxic and was intubated and transferred to [facility]. Treated with steroids, Remdesivir, Actemra and convalescent plasma. Subsequently extubated on 6/22. Hospital course has then been complicated by extreme anxiety and lately by superimposed Streptococcus pneumoniae pneumonia and bacteremia.   Primary Care Provider is: Redmond School, MD of [place]: Will have [facility] PAC nurse follow for transition at a [facility] SNF facility.    For questions contact:   Natividad Brood, RN BSN [place] [facility] Liaison  [phone] business mobile phone Toll free office [phone]  Fax number: [phone] [email or web address]

## 2019-01-13 NOTE — Plan of Care (Signed)
  Problem: Education: Goal: Knowledge of General Education information will improve Description: Patient currently intubated and sedated. Will continue to monitor.  Outcome: Progressing   Problem: Health Behavior/Discharge Planning: Goal: Ability to manage health-related needs will improve Outcome: Progressing   Problem: Clinical Measurements: Goal: Ability to maintain clinical measurements within normal limits will improve Outcome: Progressing Goal: Will remain free from infection Outcome: Progressing Goal: Diagnostic test results will improve Outcome: Progressing Goal: Respiratory complications will improve Outcome: Progressing Goal: Cardiovascular complication will be avoided Outcome: Progressing   Problem: Activity: Goal: Risk for activity intolerance will decrease Outcome: Progressing   Problem: Nutrition: Goal: Adequate nutrition will be maintained Outcome: Progressing   Problem: Coping: Goal: Level of anxiety will decrease Outcome: Progressing   Problem: Elimination: Goal: Will not experience complications related to bowel motility Outcome: Progressing Goal: Will not experience complications related to urinary retention Outcome: Progressing   Problem: Pain Managment: Goal: General experience of comfort will improve Outcome: Progressing   Problem: Safety: Goal: Ability to remain free from injury will improve Outcome: Progressing   Problem: Skin Integrity: Goal: Risk for impaired skin integrity will decrease Outcome: Progressing   Problem: Skin Integrity: Goal: Risk for impaired skin integrity will decrease Outcome: Progressing   Problem: Education: Goal: Knowledge of risk factors and measures for prevention of condition will improve Outcome: Progressing   Problem: Coping: Goal: Psychosocial and spiritual needs will be supported Outcome: Progressing   Problem: Respiratory: Goal: Will maintain a patent airway Outcome: Progressing Goal:  Complications related to the disease process, condition or treatment will be avoided or minimized Outcome: Progressing

## 2019-01-13 NOTE — Progress Notes (Signed)
Attempt made to call report to [facility] for report - no answer.

## 2019-01-13 NOTE — Progress Notes (Signed)
Attempted to call the Pt's family member, Audry Pili, went to voice mail. Unable to advise of Pt being transferred to [facility],

## 2019-01-13 NOTE — TOC Transition Note (Signed)
Transition of Care [facility]) - CM/SW Discharge Note   Patient Details  Name: Joel Greene MRN: [id number] Date of Birth: Apr 03, 1951  [facility]) CM/SW Contact:  Alberteen Sam, [place] Phone Number: [phone] 01/13/2019, 10:48 AM   Clinical Narrative:     Patient will DC to: [place] Anticipated DC date: 01/13/2019 Family notified: Audry Pili (brother) Transport MC:NOBS  Per MD patient ready for DC to [place] . RN, patient, patient's family, and facility notified of DC. Discharge Summary sent to facility. RN given number for report [phone] Room 803A . DC packet on chart. Ambulance transport requested for patient.  CSW signing off.  [place], [employer]    Final next level of care: Skilled Nursing Facility Barriers to Discharge: No Barriers Identified   Patient Goals and CMS Choice Patient states their goals for this hospitalization and ongoing recovery are:: to go to rehab then home CMS Medicare.gov Compare Post Acute Care list provided to:: Patient Choice offered to / list presented to : Patient  Discharge Placement PASRR number recieved: 01/12/19            Patient [facility] bed at: [facility] Patient to be transferred to facility by: [place] Name of family member notified: Audry Pili (brother) Patient and family notified of of transfer: 01/13/19  Discharge Plan and Services     Post Acute Care Choice: [place]                               Social Determinants of Health (SDOH) Interventions     Readmission Risk Interventions No flowsheet data found.

## 2019-01-13 NOTE — Progress Notes (Addendum)
PHARMACY CONSULT NOTE FOR:  OUTPATIENT  PARENTERAL ANTIBIOTIC THERAPY (OPAT)  Indication: Strep bacteremia/PNA Regimen: Ceftriaxone 2g IV q24 End date: 01/15/19  IV antibiotic discharge orders are pended. To discharging provider:  please sign these orders via discharge navigator,  Select New Orders & click on the button choice - Manage This Unsigned Work.    OPAT Order Details            Outpatient Parenteral Antibiotic Therapy Consult  Until discontinued    Provider:  (Not yet assigned)  Question Answer Comment  Antibiotic Ceftriaxone (Rocephin) IVPB   Indications for use Streptococcus bacteremia   End Date 01/15/2019              Onnie Boer, PharmD, BCIDP, AAHIVP, CPP Infectious Disease Pharmacist 01/13/2019 10:53 AM   Addendum  Pt will be going to SNF so will not need OPAT  Onnie Boer, PharmD, BCIDP, AAHIVP, CPP Infectious Disease Pharmacist 01/13/2019 11:04 AM

## 2019-01-13 NOTE — Plan of Care (Signed)
  Problem: Education: Goal: Knowledge of General Education information will improve Description: Patient currently intubated and sedated. Will continue to monitor.  01/13/2019 1425 by Shanon Ace, RN Outcome: Adequate for Discharge 01/13/2019 0730 by Shanon Ace, RN Outcome: Progressing   Problem: Health Behavior/Discharge Planning: Goal: Ability to manage health-related needs will improve 01/13/2019 1425 by Shanon Ace, RN Outcome: Adequate for Discharge 01/13/2019 0730 by Shanon Ace, RN Outcome: Progressing   Problem: Clinical Measurements: Goal: Ability to maintain clinical measurements within normal limits will improve 01/13/2019 1425 by Shanon Ace, RN Outcome: Adequate for Discharge 01/13/2019 0730 by Shanon Ace, RN Outcome: Progressing Goal: Will remain free from infection 01/13/2019 1425 by Shanon Ace, RN Outcome: Adequate for Discharge 01/13/2019 0730 by Shanon Ace, RN Outcome: Progressing Goal: Diagnostic test results will improve 01/13/2019 1425 by Shanon Ace, RN Outcome: Adequate for Discharge 01/13/2019 0730 by Shanon Ace, RN Outcome: Progressing Goal: Respiratory complications will improve 01/13/2019 1425 by Shanon Ace, RN Outcome: Adequate for Discharge 01/13/2019 0730 by Shanon Ace, RN Outcome: Progressing Goal: Cardiovascular complication will be avoided 01/13/2019 1425 by Shanon Ace, RN Outcome: Adequate for Discharge 01/13/2019 0730 by Shanon Ace, RN Outcome: Progressing   Problem: Activity: Goal: Risk for activity intolerance will decrease 01/13/2019 1425 by Shanon Ace, RN Outcome: Adequate for Discharge 01/13/2019 0730 by Shanon Ace, RN Outcome: Progressing   Problem: Nutrition: Goal: Adequate nutrition will be maintained 01/13/2019 1425 by Shanon Ace, RN Outcome: Adequate for Discharge 01/13/2019 0730 by Shanon Ace, RN Outcome: Progressing   Problem: Coping: Goal: Level of anxiety will decrease 01/13/2019  1425 by Shanon Ace, RN Outcome: Adequate for Discharge 01/13/2019 0730 by Shanon Ace, RN Outcome: Progressing   Problem: Elimination: Goal: Will not experience complications related to bowel motility 01/13/2019 1425 by Shanon Ace, RN Outcome: Adequate for Discharge 01/13/2019 0730 by Shanon Ace, RN Outcome: Progressing Goal: Will not experience complications related to urinary retention 01/13/2019 1425 by Shanon Ace, RN Outcome: Adequate for Discharge 01/13/2019 0730 by Shanon Ace, RN Outcome: Progressing   Problem: Pain Managment: Goal: General experience of comfort will improve 01/13/2019 1425 by Shanon Ace, RN Outcome: Adequate for Discharge 01/13/2019 0730 by Shanon Ace, RN Outcome: Progressing   Problem: Safety: Goal: Ability to remain free from injury will improve 01/13/2019 1425 by Shanon Ace, RN Outcome: Adequate for Discharge 01/13/2019 0730 by Shanon Ace, RN Outcome: Progressing   Problem: Skin Integrity: Goal: Risk for impaired skin integrity will decrease 01/13/2019 1425 by Shanon Ace, RN Outcome: Adequate for Discharge 01/13/2019 0730 by Shanon Ace, RN Outcome: Progressing   Problem: Skin Integrity: Goal: Risk for impaired skin integrity will decrease 01/13/2019 1425 by Shanon Ace, RN Outcome: Adequate for Discharge 01/13/2019 0730 by Shanon Ace, RN Outcome: Progressing   Problem: Education: Goal: Knowledge of risk factors and measures for prevention of condition will improve 01/13/2019 1425 by Shanon Ace, RN Outcome: Adequate for Discharge 01/13/2019 0730 by Shanon Ace, RN Outcome: Progressing   Problem: Coping: Goal: Psychosocial and spiritual needs will be supported 01/13/2019 1425 by Shanon Ace, RN Outcome: Adequate for Discharge 01/13/2019 0730 by Shanon Ace, RN Outcome: Progressing   Problem: Respiratory: Goal: Will maintain a patent airway 01/13/2019 1425 by Shanon Ace, RN Outcome: Adequate for  Discharge 01/13/2019 0730 by Shanon Ace, RN Outcome: Progressing Goal: Complications related to the disease process, condition or treatment will be avoided or minimized 01/13/2019 1425 by Shanon Ace, RN Outcome: Adequate for Discharge 01/13/2019 0730 by Shanon Ace, RN Outcome: Progressing

## 2019-01-13 NOTE — Discharge Summary (Signed)
Physician Discharge Summary  Joel Greene TOI:[id number] DOB: 1950/08/22 DOA: 12/18/2018  PCP: Redmond School, MD  Admit date: 12/18/2018 Discharge date: 01/13/2019  Admitted From: Home Disposition:  SNF  Recommendations for Outpatient Follow-up:  1. Follow up with PCP in 1-2 weeks 2. Please obtain BMP/CBC in one week 3. [facility]. 4. Remove PICC line after completion of IV antibiotics. 5. Check CBGs in the morning, he has been recommended to lifestyle modifications as his A1c was 6.5.   Home Health:No Equipment/Devices:None  Discharge Condition:Stable CODE STATUS:Full Diet recommendation: Heart Healthy   Brief/Interim Summary: 68 y.o. male past medical history of CAD, depression anxiety chronic pain presents with fever shortness of breath and was intubated and transferred to [facility]   Discharge Diagnoses:  Active Problems:   Acute respiratory failure with hypoxia (HCC)   Sepsis due to undetermined organism ([place])   Pneumonia due to COVID-19 virus   Acute respiratory failure with hypoxemia (HCC)   Hypokalemia   Encounter for intubation   History of ETT Acute respiratory failure with hypoxia due to COVID-19 viral infection: He was admitted to the hospital and intubated he completed his course of IV Remdesivir and steroids and he did receive 1 dose of Actemra and convalescent plasma he was satting greater 92% on room air and he was discharged in stable condition.  Sepsis due to bacteremia due to pneumococcal pneumonia: After he completed his course of Actemra steroids and IV Remdesivir he became more short of breath a CT scan of the chest showed right middle lobe and left lower lobe infiltrate he was started empirically on IV antibiotics blood cultures were drawn which grew strep pneumo sensitive to Rocephin. Repeated blood cultures remain negative it was discussed with the ID physician who recommended 14 days of IV empiric  antibiotics. His last day of IV antibiotics will be 01/17/2019. PICC line was placed on 01/10/2019  Right perinephric stranding: Incidental finding on CT on 01/05/2019, he denied any dysuria.  Oral Candidiasis: He was started on mouthwash 3 times a day and completed course of IV Diflucan he completed his treatment on 01/11/2019.  Anxiety: Initially required Precedex followed by benzodiazepine which have been tapered down. He will continue on BuSpar and Effexor as an outpatient, his benzodiazepine and Depakote were tapered down.  Acute on chronic diastolic heart failure: His weight appears to be unchanged compared to previous, going back to discharge estimated dry weight is around 75 kg.  In-house he weight 74 kg.  Essential hypertension: No changes made to his medication.  BPH: Continue Flomax.  Diabetes mellitus type 2/hyperglycemia stress-induced steroids: His A1c was 6.5. He was treated with long-acting insulin plus sliding scale. He was counseled about lifestyle modification and diet.  Elevated LFTs: Likely due to SARS-CoV-2 now improved.  Mild thrombocytopenia: Likely due to sepsis now resolved.  Discharge Instructions  Discharge Instructions    Diet - low sodium heart healthy   Complete by: As directed    Home infusion instructions [facility] May follow [place] Dosing Protocol; May administer Cathflo as needed to maintain patency of vascular access device.; Flushing of vascular access device: per [facility] Protocol: 0.9% NaCl pre/post medica...   Complete by: As directed    Instructions: May follow [place] Dosing Protocol   Instructions: May administer Cathflo as needed to maintain patency of vascular access device.   Instructions: Flushing of vascular access device: per [facility] Protocol: 0.9% NaCl pre/post medication administration and  prn patency; Heparin 100 u/ml, 74ml for implanted ports and Heparin 10u/ml, 85ml for all other central venous catheters.    Instructions: May follow [facility] Anaphylaxis Protocol for First Dose Administration in the home: 0.9% NaCl at 25-50 ml/hr to maintain IV access for protocol meds. Epinephrine 0.3 ml IV/IM PRN and Benadryl 25-50 IV/IM PRN s/s of anaphylaxis.   Instructions: [place] Infusion Coordinator (RN) to assist per patient IV care needs in the home PRN.   Increase activity slowly   Complete by: As directed      Allergies as of 01/13/2019      Reactions   Clams [shellfish Allergy] Nausea And Vomiting   Demeclocycline Rash   Tetracyclines & Related Rash      Medication List    STOP taking these medications   ibuprofen 200 MG tablet Commonly known as: ADVIL     TAKE these medications   aspirin EC 81 MG tablet Take 81 mg by mouth daily.   atorvastatin 20 MG tablet Commonly known as: LIPITOR Take 1 tablet by mouth daily.   busPIRone 10 MG tablet Commonly known as: BUSPAR Take 1 tablet by mouth 2 (two) times a day.   cefTRIAXone 2 g in sodium chloride 0.9 % 100 mL Inject 2 g into the vein daily.   COLON CLEANSE PO Take 1 capsule by mouth daily as needed (constipation).   cyclobenzaprine 10 MG tablet Commonly known as: FLEXERIL Take 1 tablet (10 mg total) by mouth 3 (three) times daily as needed for muscle spasms.   fluticasone 50 MCG/ACT nasal spray Commonly known as: FLONASE INHALE 2 SPRAYS IN EACH NOSTRIL ONCE DAILY.   gabapentin 300 MG capsule Commonly known as: NEURONTIN Take 300-600 mg by mouth 3 (three) times daily. Patient takes 1 capsule in the morning, 1 capsule in the evening, and 2 capsules at night   latanoprost 0.005 % ophthalmic solution Commonly known as: XALATAN Place 1 drop into both eyes at bedtime.   multivitamin with minerals Tabs tablet Take 1 tablet by mouth daily.   mupirocin ointment 2 % Commonly known as: BACTROBAN Place 1 application into the nose at bedtime. For dryness from CPAP   Oxycodone HCl 10 MG Tabs Take 1 tablet (10 mg total) by  mouth every 6 (six) hours as needed for up to 3 days.   pantoprazole 40 MG tablet Commonly known as: PROTONIX TAKE (1) TABLET BY MOUTH TWICE DAILY BEFORE MEALS (BREAKFAST AND SUPPER).   PROBIOTIC PO Take 1 tablet by mouth daily.   SYSTANE OP Place 1 drop into both eyes daily as needed (seasonal allergies/ dry eyes).   tamsulosin 0.4 MG Caps capsule Commonly known as: FLOMAX Take 0.4 mg by mouth daily after breakfast.   tiZANidine 4 MG tablet Commonly known as: ZANAFLEX Take 4 mg by mouth every 8 (eight) hours as needed for muscle spasms.   venlafaxine XR 75 MG 24 hr capsule Commonly known as: EFFEXOR-XR TAKE 1 CAPSULE ONCE A DAY WITH BREAKFAST.   Vitamin D3 125 MCG (5000 UT) Tabs Take 1 capsule by mouth daily.   zolpidem 10 MG tablet Commonly known as: AMBIEN Take 1 tablet (10 mg total) by mouth at bedtime as needed for up to 30 days for sleep.            Home Infusion Instuctions  (From admission, onward)         Start     Ordered   01/13/19 0000  Home infusion instructions [place]  May follow [place] Dosing Protocol; May administer Cathflo as needed to maintain patency of vascular access device.; Flushing of vascular access device: per [facility] Protocol: 0.9% NaCl pre/post medica...    Question Answer Comment  Instructions May follow [place] Dosing Protocol   Instructions May administer Cathflo as needed to maintain patency of vascular access device.   Instructions Flushing of vascular access device: per [facility] Protocol: 0.9% NaCl pre/post medication administration and prn patency; Heparin 100 u/ml, 45ml for implanted ports and Heparin 10u/ml, 60ml for all other central venous catheters.   Instructions May follow [facility] Anaphylaxis Protocol for First Dose Administration in the home: 0.9% NaCl at 25-50 ml/hr to maintain IV access for protocol meds. Epinephrine 0.3 ml IV/IM PRN and Benadryl 25-50 IV/IM PRN s/s of anaphylaxis.   Instructions [facility]  Infusion Coordinator (RN) to assist per patient IV care needs in the home PRN.      01/13/19 1029         Contact information for after-discharge care    Destination    [facility] .   Service: Skilled Nursing Contact information: [place] [address] [phone]             Allergies  Allergen Reactions  . Clams [Shellfish Allergy] Nausea And Vomiting  . Demeclocycline Rash  . Tetracyclines & Related Rash    Consultations:  None    Procedures/Studies: Dg Abd 1 View  Result Date: 12/20/2018 CLINICAL DATA:  68 year old male with abdominal distention and pain. EXAM: ABDOMEN - 1 VIEW COMPARISON:  Abdominal radiograph dated 06/11/2016 FINDINGS: There is severe gaseous distention of the stomach. This may represent gastric dysmotility or gastroparesis although a gastric outlet obstruction is not entirely excluded. CT may provide better evaluation. Air is noted within the colon. No free air identified. There is no dilatation of the small bowel. The osseous structures and soft tissues are grossly unremarkable. IMPRESSION: Severe gaseous distention of the stomach. Electronically Signed   By: Anner Crete M.D.   On: 12/20/2018 22:23   Ct Chest Wo Contrast  Result Date: 01/05/2019 CLINICAL DATA:  Hospitalization for COVID-19/respiratory failure, hospitalization complicated by superimposed Streptococcus pneumonia and bacteremia EXAM: CT CHEST WITHOUT CONTRAST TECHNIQUE: Multidetector CT imaging of the chest was performed following the standard protocol without IV contrast. COMPARISON:  Numerous prior chest radiographs, most recently December 18, 2018 FINDINGS: Cardiovascular: Coronary calcifications and calcification of the aortic valve leaflets. Cardiac size is normal. Mild central pulmonary arterial enlargement. Mediastinum/Nodes: Few reactive appearing low-attenuation mediastinal and hilar nodes. Thyroid gland, trachea and esophagus  demonstrate no significant abnormality. Lungs/Pleura: Diffuse airways thickening and scattered secretions. There are scattered peripheral areas of ground-glass opacity throughout both lungs. More focal consolidation is present in the right middle and lower lobe and posterior basal segment of the left lower lobe. Bronchiectatic changes are present of the right lower lobe airways. Superimposed areas of volume loss in both lung bases. Upper Abdomen: Mild nonspecific stranding seen along the superior aspect of the right kidney. Musculoskeletal: Flowing anterior vertebral body osteophytosis compatible with diffuse idiopathic skeletal hyperostosis. No acute osseous abnormality or suspicious osseous lesion. 01 soft tissues of the chest wall are unremarkable. IMPRESSION: Findings compatible with patient's history of viral pneumonia with bacterial superinfection. Airspace disease most pronounced in the right middle and lower lobes. Likely developing architectural distortion and scarring with bronchiectatic changes the right lower lobe airways. Reactive mediastinal and hilar adenopathy. Central pulmonary arterial enlargement may reflect pulmonary  arterial hypertension. Osseous features compatible with diffuse idiopathic skeletal hyperostosis. Mild nonspecific right perinephric stranding, incompletely assessed on this exam. Correlate with renal function. Aortic Atherosclerosis (ICD10-I70.0). Electronically Signed   By: MD Lovena Le   On: 01/05/2019 18:16   Dg Chest Port 1 View  Result Date: 01/02/2019 CLINICAL DATA:  Hypoxia EXAM: PORTABLE CHEST 1 VIEW COMPARISON:  12/27/2018, 12/25/2018, 12/19/2018, 12/18/2018 FINDINGS: Right upper extremity catheter tip over the cavoatrial region. Low lung volumes. Increased airspace disease in the right mid to upper lung. Bilateral pulmonary airspace opacities are otherwise unchanged. Stable enlarged cardiomediastinal silhouette. No pneumothorax. IMPRESSION: 1. Low lung volumes 2.  Interval worsening of airspace disease in the right mid to upper lung since the most recent prior. Bilateral pulmonary infiltrates are otherwise unchanged. 3. Enlarged cardiomediastinal silhouette Electronically Signed   By: Donavan Foil M.D.   On: 01/02/2019 00:30   Dg Chest Port 1 View  Result Date: 12/27/2018 CLINICAL DATA:  Shortness of breath EXAM: PORTABLE CHEST 1 VIEW COMPARISON:  December 25, 2018 FINDINGS: Central catheter tip is in the superior vena cava. No pneumothorax. There is patchy airspace consolidation in the right mid lung and both lower lung zone regions. There is cardiomegaly with pulmonary vascularity normal. No adenopathy. No bone lesions. IMPRESSION: Stable airspace opacity bilaterally, most likely due to multifocal pneumonia. Stable cardiac prominence. No change in central catheter position. No pneumothorax. Electronically Signed   By: Lowella Grip III M.D.   On: 12/27/2018 08:51   Dg Chest Port 1 View  Result Date: 12/25/2018 CLINICAL DATA:  Status postextubation.  Hypoxia. EXAM: PORTABLE CHEST 1 VIEW COMPARISON:  December 20, 2018 FINDINGS: Endotracheal tube and nasogastric tube have been removed. Central catheter tip is in the superior vena cava. No pneumothorax. There is airspace opacity in both lower lobes with consolidation medially in both lung bases, progressed compared to most recent study. More patchy infiltrate in the right upper lobe is essentially stable. Left upper lobe is now clear. There is cardiomegaly with pulmonary vascularity normal. No adenopathy. No bone lesions. IMPRESSION: Progression of airspace opacity in each lower lobe. Stable more subtle opacity right upper lobe. Left upper lobe now clear. Stable cardiomegaly. No adenopathy evident. Central catheter tip in superior vena cava.  No pneumothorax. Electronically Signed   By: Lowella Grip III M.D.   On: 12/25/2018 10:30   Dg Chest Port 1 View  Result Date: 12/20/2018 CLINICAL DATA:  Intubation. EXAM:  PORTABLE CHEST 1 VIEW COMPARISON:  12/19/2018. FINDINGS: Right PICC line noted with tip over superior vena cava. Endotracheal tube, NG tube in stable position. Heart size stable. Interim improvement and bilateral pulmonary infiltrates with mild residual interstitial prominence. No pleural effusion or pneumothorax. IMPRESSION: 1. Right PICC line noted with tip over superior vena cava. Endotracheal tube and NG tube stable position. 2. Improvement of bilateral pulmonary infiltrates with mild residual interstitial prominence. Electronically Signed   By: Marcello Moores  Register   On: 12/20/2018 06:16   Dg Chest Port 1 View  Result Date: 12/19/2018 CLINICAL DATA:  Status post intubation and OG tube placement. EXAM: PORTABLE CHEST 1 VIEW COMPARISON:  Single-view of the chest 12/18/2018 at 1338 hours. FINDINGS: Endotracheal tube is in place with the tip in good position below the clavicular heads. OG tube is also seen. The tube courses into the duodenum below the inferior margin the film. Extensive bilateral airspace disease is greater on the right and has progressed since the prior exam. Heart size is upper normal. No pneumothorax.  IMPRESSION: ETT in good position. OG tube courses into the duodenum and below the inferior margin the film. Right greater than left airspace disease appears worse than on the comparison exam. Electronically Signed   By: Inge Rise M.D.   On: 12/19/2018 00:57   Dg Chest Portable 1 View  Result Date: 12/18/2018 CLINICAL DATA:  68 year old male with shortness of breath EXAM: PORTABLE CHEST 1 VIEW COMPARISON:  11/28/2015 and prior radiographs FINDINGS: Cardiomegaly again noted. Bilateral interstitial and scattered airspace opacities, RIGHT greater than LEFT, are identified. No definite pleural effusion or pneumothorax. No acute bony abnormalities are noted. IMPRESSION: Cardiomegaly with bilateral interstitial and scattered airspace opacities, RIGHT greater than LEFT - question pneumonia  versus pulmonary edema. Electronically Signed   By: Margarette Canada M.D.   On: 12/18/2018 14:33   [place] Ekg Site Rite  Result Date: 01/09/2019 If Site Rite image not attached, placement could not be confirmed due to current cardiac rhythm.  [place] Ekg Site Rite  Result Date: 12/19/2018 If Site Rite image not attached, placement could not be confirmed due to current cardiac rhythm.   Subjective: No complaints.  Discharge Exam: Vitals:   01/13/19 0625 01/13/19 0814  BP:    Pulse: 98   Resp:    Temp:  (!) 97.1 F (36.2 C)  SpO2: 98%    Vitals:   01/13/19 0403 01/13/19 0500 01/13/19 0625 01/13/19 0814  BP: 124/84     Pulse: 97  98   Resp: 19     Temp: 98.3 F (36.8 C)   (!) 97.1 F (36.2 C)  TempSrc: Oral   Oral  SpO2: 99%  98%   Weight:  77.4 kg    Height:        General: Pt is alert, awake, not in acute distress Cardiovascular: RRR, S1/S2 +, no rubs, no gallops Respiratory: CTA bilaterally, no wheezing, no rhonchi Abdominal: Soft, NT, ND, bowel sounds + Extremities: no edema, no cyanosis    The results of significant diagnostics from this hospitalization (including imaging, microbiology, ancillary and laboratory) are listed below for reference.     Microbiology: Recent Results (from the past 240 hour(s))  Culture, blood (routine x 2)     Status: None   Collection Time: 01/05/19  4:30 PM   Specimen: BLOOD  Result Value Ref [place] Status   Specimen Description   Final    BLOOD LEFT ARM Performed at [facility] [address], [place], [place] [address]    Special Requests   Final    BOTTLES DRAWN AEROBIC AND ANAEROBIC Blood Culture adequate volume Performed at [facility] [address] [place], [place] [address]    Culture   Final    NO GROWTH 5 DAYS Performed at [facility], [place] [address] [place], [place] [address]    Report Status 01/10/2019 FINAL  Final  Culture, blood (routine x 2)     Status: None    Collection Time: 01/05/19  4:38 PM   Specimen: BLOOD  Result Value Ref Range Status   Specimen Description   Final    BLOOD LEFT HAND Performed at [facility] [address] [place], [place] [address]    Special Requests   Final    BOTTLES DRAWN AEROBIC AND ANAEROBIC Blood Culture adequate volume Performed at [facility] [address], [place], [place] [address]    Culture   Final    NO GROWTH 5 DAYS Performed  at [facility], [place] [address], [place], [place] [address]    Report Status 01/10/2019 FINAL  Final     Labs: BNP (last 3 results) Recent Labs    12/30/18 0230 12/31/18 0500 01/01/19 0200  BNP 77.9 54.7 981.1*   Basic Metabolic Panel: Recent Labs  Lab 01/07/19 0500 01/10/19 0200 01/12/19 0255  NA 139 137 139  K 3.8 4.4 4.2  CL 98 98 99  CO2 26 28 30   GLUCOSE 129* 132* 122*  BUN 19 16 14   CREATININE 0.61 0.61 0.68  CALCIUM 9.1 9.2 9.2   Liver Function Tests: Recent Labs  Lab 01/07/19 0500 01/10/19 0200 01/12/19 0255  AST 44* 28 21  ALT 167* 76* 56*  ALKPHOS 112 90 83  BILITOT 0.2* 0.5 0.4  PROT 5.6* 5.4* 5.5*  ALBUMIN 3.0* 2.9* 3.1*   No results for input(s): LIPASE, AMYLASE in the last 168 hours. No results for input(s): AMMONIA in the last 168 hours. CBC: Recent Labs  Lab 01/07/19 0500 01/10/19 0200 01/12/19 0255  WBC 3.6* 3.7* 3.0*  NEUTROABS  --  2.3 1.5*  HGB 14.3 14.2 13.9  HCT 43.8 43.0 41.9  MCV 98.4 99.5 99.8  PLT 147* 162 186   Cardiac Enzymes: No results for input(s): CKTOTAL, CKMB, CKMBINDEX, TROPONINI in the last 168 hours. BNP: Invalid input(s): POCBNP CBG: Recent Labs  Lab 01/12/19 1211 01/12/19 1625 01/12/19 2135 01/13/19 0620 01/13/19 0857  GLUCAP 248* 146* 187* 115* 150*   D-Dimer Recent Labs    01/12/19 0255  DDIMER 0.69*   Hgb A1c No results for input(s): HGBA1C in the last 72 hours. Lipid Profile No results for input(s): CHOL, HDL, LDLCALC, TRIG,  CHOLHDL, LDLDIRECT in the last 72 hours. Thyroid function studies No results for input(s): TSH, T4TOTAL, T3FREE, THYROIDAB in the last 72 hours.  Invalid input(s): FREET3 Anemia work up No results for input(s): VITAMINB12, FOLATE, FERRITIN, TIBC, IRON, RETICCTPCT in the last 72 hours. Urinalysis    Component Value Date/Time   COLORURINE YELLOW 12/18/2018 1527   APPEARANCEUR HAZY (A) 12/18/2018 1527   LABSPEC 1.019 12/18/2018 1527   PHURINE 6.0 12/18/2018 1527   GLUCOSEU NEGATIVE 12/18/2018 1527   HGBUR NEGATIVE 12/18/2018 1527   BILIRUBINUR NEGATIVE 12/18/2018 1527   KETONESUR NEGATIVE 12/18/2018 1527   PROTEINUR 30 (A) 12/18/2018 1527   UROBILINOGEN 0.2 02/06/2015 1353   NITRITE NEGATIVE 12/18/2018 1527   LEUKOCYTESUR NEGATIVE 12/18/2018 1527   Sepsis Labs Invalid input(s): PROCALCITONIN,  WBC,  LACTICIDVEN Microbiology Recent Results (from the past 240 hour(s))  Culture, blood (routine x 2)     Status: None   Collection Time: 01/05/19  4:30 PM   Specimen: BLOOD  Result Value Ref Range Status   Specimen Description   Final    BLOOD LEFT ARM Performed at [facility], [place] [address], [place], [place] [address]    Special Requests   Final    BOTTLES DRAWN AEROBIC AND ANAEROBIC Blood Culture adequate volume Performed at [facility] [address], [place], [place] [address]    Culture   Final    NO GROWTH 5 DAYS Performed at [facility], [place] [address] [place], [place] [address]    Report Status 01/10/2019 FINAL  Final  Culture, blood (routine x 2)     Status: None   Collection Time: 01/05/19  4:38 PM   Specimen: BLOOD  Result Value Ref Range Status   Specimen Description   Final  BLOOD LEFT HAND Performed at [facility] [address] [place], [place] [address]    Special Requests   Final    BOTTLES DRAWN AEROBIC AND ANAEROBIC Blood Culture adequate volume Performed at [facility] [address] [place], [place] [address]    Culture   Final    NO GROWTH 5 DAYS Performed at [facility], [place] [address] [place], [place] [address]    Report Status 01/10/2019 FINAL  Final     Time coordinating discharge: Over 40 minutes  SIGNED:   Charlynne Cousins, MD  [facility] 01/13/2019, 10:30 AM Pager   If 7PM-7AM, please contact night-coverage [email or web address] Password [email or web address]

## 2019-01-14 DIAGNOSIS — R6520 Severe sepsis without septic shock: Secondary | ICD-10-CM | POA: Diagnosis not present

## 2019-01-14 DIAGNOSIS — J9601 Acute respiratory failure with hypoxia: Secondary | ICD-10-CM | POA: Diagnosis not present

## 2019-01-14 DIAGNOSIS — J1289 Other viral pneumonia: Secondary | ICD-10-CM | POA: Diagnosis not present

## 2019-01-14 DIAGNOSIS — U071 COVID-19: Secondary | ICD-10-CM | POA: Diagnosis not present

## 2019-01-14 DIAGNOSIS — A4189 Other specified sepsis: Secondary | ICD-10-CM | POA: Diagnosis not present

## 2019-01-14 DIAGNOSIS — E119 Type 2 diabetes mellitus without complications: Secondary | ICD-10-CM | POA: Diagnosis not present

## 2019-01-14 DIAGNOSIS — I10 Essential (primary) hypertension: Secondary | ICD-10-CM | POA: Diagnosis not present

## 2019-01-18 DIAGNOSIS — I10 Essential (primary) hypertension: Secondary | ICD-10-CM | POA: Diagnosis not present

## 2019-01-18 DIAGNOSIS — U071 COVID-19: Secondary | ICD-10-CM | POA: Diagnosis not present

## 2019-01-18 DIAGNOSIS — F3489 Other specified persistent mood disorders: Secondary | ICD-10-CM | POA: Diagnosis not present

## 2019-01-18 DIAGNOSIS — J1289 Other viral pneumonia: Secondary | ICD-10-CM | POA: Diagnosis not present

## 2019-01-20 DIAGNOSIS — A419 Sepsis, unspecified organism: Secondary | ICD-10-CM | POA: Diagnosis not present

## 2019-01-20 DIAGNOSIS — U071 COVID-19: Secondary | ICD-10-CM | POA: Diagnosis not present

## 2019-01-20 DIAGNOSIS — J188 Other pneumonia, unspecified organism: Secondary | ICD-10-CM | POA: Diagnosis not present

## 2019-01-20 DIAGNOSIS — I5032 Chronic diastolic (congestive) heart failure: Secondary | ICD-10-CM | POA: Diagnosis not present

## 2019-01-20 DIAGNOSIS — E119 Type 2 diabetes mellitus without complications: Secondary | ICD-10-CM | POA: Diagnosis not present

## 2019-01-20 DIAGNOSIS — J9601 Acute respiratory failure with hypoxia: Secondary | ICD-10-CM | POA: Diagnosis not present

## 2019-01-20 DIAGNOSIS — I10 Essential (primary) hypertension: Secondary | ICD-10-CM | POA: Diagnosis not present

## 2019-01-24 ENCOUNTER — Other Ambulatory Visit: Payer: Self-pay

## 2019-01-24 ENCOUNTER — Inpatient Hospital Stay (HOSPITAL_COMMUNITY)
Admission: EM | Admit: 2019-01-24 | Discharge: 2019-01-26 | DRG: 177 | Disposition: A | Discharge status: Skilled Nursing Facility | Payer: Medicare Other | Source: Skilled Nursing Facility | Attending: Family Medicine | Admitting: Family Medicine

## 2019-01-24 ENCOUNTER — Encounter (HOSPITAL_COMMUNITY): Payer: Self-pay

## 2019-01-24 ENCOUNTER — Emergency Department (HOSPITAL_COMMUNITY): Payer: Medicare Other

## 2019-01-24 DIAGNOSIS — M2550 Pain in unspecified joint: Secondary | ICD-10-CM | POA: Diagnosis not present

## 2019-01-24 DIAGNOSIS — Z7982 Long term (current) use of aspirin: Secondary | ICD-10-CM | POA: Diagnosis not present

## 2019-01-24 DIAGNOSIS — J9601 Acute respiratory failure with hypoxia: Secondary | ICD-10-CM | POA: Diagnosis present

## 2019-01-24 DIAGNOSIS — F419 Anxiety disorder, unspecified: Secondary | ICD-10-CM | POA: Diagnosis present

## 2019-01-24 DIAGNOSIS — E876 Hypokalemia: Secondary | ICD-10-CM | POA: Diagnosis present

## 2019-01-24 DIAGNOSIS — R0902 Hypoxemia: Secondary | ICD-10-CM | POA: Diagnosis not present

## 2019-01-24 DIAGNOSIS — J188 Other pneumonia, unspecified organism: Secondary | ICD-10-CM | POA: Diagnosis not present

## 2019-01-24 DIAGNOSIS — K219 Gastro-esophageal reflux disease without esophagitis: Secondary | ICD-10-CM | POA: Diagnosis present

## 2019-01-24 DIAGNOSIS — J1289 Other viral pneumonia: Secondary | ICD-10-CM | POA: Diagnosis present

## 2019-01-24 DIAGNOSIS — R0609 Other forms of dyspnea: Secondary | ICD-10-CM

## 2019-01-24 DIAGNOSIS — R000 Tachycardia, unspecified: Secondary | ICD-10-CM | POA: Diagnosis not present

## 2019-01-24 DIAGNOSIS — I5032 Chronic diastolic (congestive) heart failure: Secondary | ICD-10-CM | POA: Diagnosis not present

## 2019-01-24 DIAGNOSIS — F329 Major depressive disorder, single episode, unspecified: Secondary | ICD-10-CM | POA: Diagnosis present

## 2019-01-24 DIAGNOSIS — E119 Type 2 diabetes mellitus without complications: Secondary | ICD-10-CM | POA: Diagnosis not present

## 2019-01-24 DIAGNOSIS — F3489 Other specified persistent mood disorders: Secondary | ICD-10-CM | POA: Diagnosis not present

## 2019-01-24 DIAGNOSIS — Z8249 Family history of ischemic heart disease and other diseases of the circulatory system: Secondary | ICD-10-CM

## 2019-01-24 DIAGNOSIS — E785 Hyperlipidemia, unspecified: Secondary | ICD-10-CM | POA: Diagnosis present

## 2019-01-24 DIAGNOSIS — I10 Essential (primary) hypertension: Secondary | ICD-10-CM | POA: Diagnosis present

## 2019-01-24 DIAGNOSIS — U071 COVID-19: Secondary | ICD-10-CM | POA: Diagnosis not present

## 2019-01-24 DIAGNOSIS — Z7401 Bed confinement status: Secondary | ICD-10-CM | POA: Diagnosis not present

## 2019-01-24 DIAGNOSIS — J1282 Pneumonia due to coronavirus disease 2019: Secondary | ICD-10-CM

## 2019-01-24 DIAGNOSIS — Z87891 Personal history of nicotine dependence: Secondary | ICD-10-CM

## 2019-01-24 DIAGNOSIS — J189 Pneumonia, unspecified organism: Secondary | ICD-10-CM | POA: Diagnosis not present

## 2019-01-24 DIAGNOSIS — J168 Pneumonia due to other specified infectious organisms: Secondary | ICD-10-CM | POA: Diagnosis not present

## 2019-01-24 DIAGNOSIS — Z9289 Personal history of other medical treatment: Secondary | ICD-10-CM

## 2019-01-24 DIAGNOSIS — R0602 Shortness of breath: Secondary | ICD-10-CM | POA: Diagnosis not present

## 2019-01-24 DIAGNOSIS — R918 Other nonspecific abnormal finding of lung field: Secondary | ICD-10-CM | POA: Diagnosis not present

## 2019-01-24 LAB — CBC WITH DIFFERENTIAL/PLATELET
Abs Immature Granulocytes: 0.07 10*3/uL (ref 0.00–0.07)
Basophils Absolute: 0.1 10*3/uL (ref 0.0–0.1)
Basophils Relative: 1 %
Eosinophils Absolute: 0 10*3/uL (ref 0.0–0.5)
Eosinophils Relative: 0 %
HCT: 36.1 % — ABNORMAL LOW (ref 39.0–52.0)
Hemoglobin: 11.6 g/dL — ABNORMAL LOW (ref 13.0–17.0)
Immature Granulocytes: 1 %
Lymphocytes Relative: 13 %
Lymphs Abs: 0.8 10*3/uL (ref 0.7–4.0)
MCH: 31.9 pg (ref 26.0–34.0)
MCHC: 32.1 g/dL (ref 30.0–36.0)
MCV: 99.2 fL (ref 80.0–100.0)
Monocytes Absolute: 0.6 10*3/uL (ref 0.1–1.0)
Monocytes Relative: 10 %
Neutro Abs: 4.9 10*3/uL (ref 1.7–7.7)
Neutrophils Relative %: 75 %
Platelets: 256 10*3/uL (ref 150–400)
RBC: 3.64 MIL/uL — ABNORMAL LOW (ref 4.22–5.81)
RDW: 14.5 % (ref 11.5–15.5)
WBC: 6.4 10*3/uL (ref 4.0–10.5)
nRBC: 0 % (ref 0.0–0.2)

## 2019-01-24 LAB — COMPREHENSIVE METABOLIC PANEL
ALT: 58 U/L — ABNORMAL HIGH (ref 0–44)
AST: 33 U/L (ref 15–41)
Albumin: 3.4 g/dL — ABNORMAL LOW (ref 3.5–5.0)
Alkaline Phosphatase: 65 U/L (ref 38–126)
Anion gap: 11 (ref 5–15)
BUN: 6 mg/dL — ABNORMAL LOW (ref 8–23)
CO2: 25 mmol/L (ref 22–32)
Calcium: 8.8 mg/dL — ABNORMAL LOW (ref 8.9–10.3)
Chloride: 106 mmol/L (ref 98–111)
Creatinine, Ser: 0.65 mg/dL (ref 0.61–1.24)
GFR calc Af Amer: >60 mL/min (ref >60)
GFR calc non Af Amer: >60 mL/min (ref >60)
Glucose, Bld: 104 mg/dL — ABNORMAL HIGH (ref 70–99)
Potassium: 2.9 mmol/L — ABNORMAL LOW (ref 3.5–5.1)
Sodium: 142 mmol/L (ref 135–145)
Total Bilirubin: 0.4 mg/dL (ref 0.3–1.2)
Total Protein: 6.2 g/dL — ABNORMAL LOW (ref 6.5–8.1)

## 2019-01-24 LAB — SARS CORONAVIRUS 2 BY RT PCR (HOSPITAL ORDER, PERFORMED IN ~~LOC~~ HOSPITAL LAB): SARS Coronavirus 2: POSITIVE — AB

## 2019-01-24 LAB — FIBRINOGEN: Fibrinogen: 430 mg/dL (ref 210–475)

## 2019-01-24 LAB — C-REACTIVE PROTEIN: CRP: <0.8 mg/dL (ref <1.0)

## 2019-01-24 LAB — FERRITIN: Ferritin: 435 ng/mL — ABNORMAL HIGH (ref 24–336)

## 2019-01-24 LAB — LACTATE DEHYDROGENASE: LDH: 226 U/L — ABNORMAL HIGH (ref 98–192)

## 2019-01-24 LAB — D-DIMER, QUANTITATIVE: D-Dimer, Quant: 0.7 ug/mL-FEU — ABNORMAL HIGH (ref 0.00–0.50)

## 2019-01-24 LAB — TRIGLYCERIDES: Triglycerides: 76 mg/dL (ref <150)

## 2019-01-24 LAB — LACTIC ACID, PLASMA: Lactic Acid, Venous: 1.2 mmol/L (ref 0.5–1.9)

## 2019-01-24 LAB — PROCALCITONIN: Procalcitonin: <0.1 ng/mL

## 2019-01-24 LAB — BRAIN NATRIURETIC PEPTIDE: B Natriuretic Peptide: 48.7 pg/mL (ref 0.0–100.0)

## 2019-01-24 MED ORDER — BUSPIRONE HCL 10 MG PO TABS
10.0000 mg | ORAL_TABLET | Freq: Two times a day (BID) | ORAL | Status: DC
  Administered 2019-01-25 – 2019-01-26 (×3): 10 mg via ORAL
  Filled 2019-01-24 (×5): qty 1

## 2019-01-24 MED ORDER — TAMSULOSIN HCL 0.4 MG PO CAPS
0.4000 mg | ORAL_CAPSULE | Freq: Every day | ORAL | Status: DC
  Administered 2019-01-25 – 2019-01-26 (×2): 0.4 mg via ORAL
  Filled 2019-01-24 (×2): qty 1

## 2019-01-24 MED ORDER — POTASSIUM CHLORIDE 20 MEQ/15ML (10%) PO SOLN
60.0000 meq | Freq: Once | ORAL | Status: DC
  Filled 2019-01-24: qty 45

## 2019-01-24 MED ORDER — VENLAFAXINE HCL ER 75 MG PO CP24
75.0000 mg | ORAL_CAPSULE | Freq: Every day | ORAL | Status: DC
  Administered 2019-01-25 – 2019-01-26 (×2): 75 mg via ORAL
  Filled 2019-01-24 (×3): qty 1

## 2019-01-24 MED ORDER — PANTOPRAZOLE SODIUM 40 MG PO TBEC
40.0000 mg | DELAYED_RELEASE_TABLET | Freq: Two times a day (BID) | ORAL | Status: DC
  Administered 2019-01-25 – 2019-01-26 (×3): 40 mg via ORAL
  Filled 2019-01-24 (×4): qty 1

## 2019-01-24 MED ORDER — ATORVASTATIN CALCIUM 10 MG PO TABS
20.0000 mg | ORAL_TABLET | Freq: Every day | ORAL | Status: DC
  Administered 2019-01-25 – 2019-01-26 (×2): 20 mg via ORAL
  Filled 2019-01-24: qty 2
  Filled 2019-01-24: qty 1
  Filled 2019-01-24: qty 2

## 2019-01-24 MED ORDER — IPRATROPIUM-ALBUTEROL 20-100 MCG/ACT IN AERS
1.0000 | INHALATION_SPRAY | Freq: Four times a day (QID) | RESPIRATORY_TRACT | Status: DC
  Administered 2019-01-25 – 2019-01-26 (×8): 1 via RESPIRATORY_TRACT
  Filled 2019-01-24: qty 4

## 2019-01-24 MED ORDER — POTASSIUM CHLORIDE CRYS ER 20 MEQ PO TBCR
40.0000 meq | EXTENDED_RELEASE_TABLET | Freq: Once | ORAL | Status: AC
  Administered 2019-01-24: 40 meq via ORAL
  Filled 2019-01-24: qty 2

## 2019-01-24 MED ORDER — METHYLPREDNISOLONE SODIUM SUCC 40 MG IJ SOLR
40.0000 mg | Freq: Two times a day (BID) | INTRAMUSCULAR | Status: DC
  Administered 2019-01-25: 11:00:00 40 mg via INTRAVENOUS
  Filled 2019-01-24 (×2): qty 1

## 2019-01-24 MED ORDER — ONDANSETRON HCL 4 MG/2ML IJ SOLN: 4.0000 mg | Freq: Four times a day (QID) | INTRAMUSCULAR | Status: DC | PRN

## 2019-01-24 MED ORDER — ONDANSETRON HCL 4 MG PO TABS: 4.0000 mg | ORAL_TABLET | Freq: Four times a day (QID) | ORAL | Status: DC | PRN

## 2019-01-24 MED ORDER — HYDRALAZINE HCL 20 MG/ML IJ SOLN: 5.0000 mg | Freq: Four times a day (QID) | INTRAMUSCULAR | Status: DC | PRN

## 2019-01-24 MED ORDER — LATANOPROST 0.005 % OP SOLN
1.0000 [drp] | Freq: Every day | OPHTHALMIC | Status: DC
  Administered 2019-01-25 (×2): 1 [drp] via OPHTHALMIC
  Filled 2019-01-24: qty 2.5

## 2019-01-24 MED ORDER — HYDROCOD POLST-CPM POLST ER 10-8 MG/5ML PO SUER: 5.0000 mL | Freq: Two times a day (BID) | ORAL | Status: DC | PRN

## 2019-01-24 MED ORDER — ZINC SULFATE 220 (50 ZN) MG PO CAPS
220.0000 mg | ORAL_CAPSULE | Freq: Every day | ORAL | Status: DC
  Administered 2019-01-25 – 2019-01-26 (×2): 220 mg via ORAL
  Filled 2019-01-24 (×2): qty 1

## 2019-01-24 MED ORDER — GABAPENTIN 300 MG PO CAPS: 300.0000 mg | ORAL_CAPSULE | Freq: Three times a day (TID) | ORAL | Status: DC

## 2019-01-24 MED ORDER — GUAIFENESIN-DM 100-10 MG/5ML PO SYRP: 10.0000 mL | ORAL_SOLUTION | ORAL | Status: DC | PRN

## 2019-01-24 MED ORDER — VITAMIN C 500 MG PO TABS
500.0000 mg | ORAL_TABLET | Freq: Every day | ORAL | Status: DC
  Administered 2019-01-25 – 2019-01-26 (×2): 500 mg via ORAL
  Filled 2019-01-24: qty 1

## 2019-01-24 MED ORDER — ENOXAPARIN SODIUM 40 MG/0.4ML ~~LOC~~ SOLN
40.0000 mg | Freq: Every day | SUBCUTANEOUS | Status: DC
  Administered 2019-01-25 (×2): 40 mg via SUBCUTANEOUS
  Filled 2019-01-24 (×2): qty 0.4

## 2019-01-24 NOTE — ED Triage Notes (Addendum)
Pt BIB EMS from [facility]. Pt positive for COVID 6 weeks ago. Chest xray done Thursday 7/23 and diagnosed with pneumonia. Pt has been on meds since Thursday. Per facility, pt not showing improvement. Pain denies SOB, cough, fever. A&O x4.   97% on 3L

## 2019-01-24 NOTE — ED Notes (Addendum)
Report given to Sherene Sires, RN at [facility]

## 2019-01-24 NOTE — ED Notes (Signed)
Spoke to lab. Patient is Covid Positive.

## 2019-01-24 NOTE — ED Notes (Signed)
[facility] are at bedside to transport patient.

## 2019-01-24 NOTE — H&P (Signed)
History and Physical    ORLYN ODONOGHUE YYQ:[id number] DOB: Mar 08, 1951 DOA: 01/24/2019  PCP: Redmond School, MD  Patient coming from: [place] and rehab  I have personally briefly reviewed patient's old medical records in [place]  Chief Complaint: Shortness of breath, cough  HPI: Joel Greene is a 68 y.o. male with medical history significant for hypertension, hyperlipidemia, depression/anxiety and recent admission for hypoxic respiratory failure due to COVID-19 infection and sepsis bacteremia from strep pneumococcal pneumonia who presents to the ED from [facility] and rehab for evaluation of dyspnea and productive cough.  Patient was recently admitted from 12/18/2018-01/13/2019 at: [facility] for acute hypoxic respiratory failure due to COVID-19 infection and pneumonia.  He required intubation and was treated with IV rem to severe, steroids and also received a one-time dose of Actemra and convalescent plasma.  After treatment he was found to have sepsis due to strep pneumococcal pneumonia and bacteremia.  He was treated with IV ceftriaxone and completed a 14-day course on 01/17/2019.  Per discharge summary at time of discharge from [facility] he was saturating over 92% on room air.  Patient was discharged from [facility] to [place] and rehab.  Approximately 3 days ago patient developed cough productive of green/brown sputum and dyspnea with exertion.  He reportedly had a chest x-ray performed within the last week which was concerning for pneumonia.  He was started on antibiotic therapy with Levaquin.  He has been using 3 L supplemental O2 via [place] at his rehab facility consistently.  Patient states he is having continued cough however decreased feeding production.  He has pleuritic chest pain with deep inspiration.  He states his dyspnea is with activity but feels improved at rest.  He denies any subjective fevers, diaphoresis, abdominal pain, dysuria,  diarrhea, or peripheral edema.  Patient was sent to the ED for further evaluation.  ED Course:  Initial vitals showed BP 165/104, pulse 106, RR 22, temp 98.5 Fahrenheit, SPO2 99% on 3 L supplemental O2 via [place].  Repeat SARS-CoV-2 test is positive.  Labs notable for WBC 6.4, hemoglobin 11.6, platelets 256,000, sodium 142, potassium 2.9, bicarb 25, BUN 6, creatinine 0.65, AST 33, ALT 58, alk phos 65, T bili 0.4, lactic acid 1.2, d-dimer 0.7, LDH 226, ferritin 435, triglycerides 76, fibrinogen 430, CRP <0.8, procalcitonin <0.10.  Blood cultures were obtained and pending.  Portable chest x-ray showed bilateral airspace opacities with interval improvement compared to prior.  Right-sided PIC catheter noted with tip overlying the mid SVC.  Patient was given 40 mEq oral potassium and the hospitalist service was consulted to admit for further evaluation management.   Review of Systems: All systems reviewed and are negative except as documented in history of present illness above.   Past Medical History:  Diagnosis Date   Bronchitis    DJD (degenerative joint disease)    Glaucoma    History of tobacco abuse 10/23/2011   History of smoking for 10 years, a pack per day, quitting at age 15.   IBS (irritable bowel syndrome)    Leukopenia 10/23/2011   Decrease in lymphocytes dating back to 1995.   PUD (peptic ulcer disease) Jan 2017   endoscopy   Renal calculi    Sleep apnea    on C-pap    Past Surgical History:  Procedure Laterality Date   APPENDECTOMY     CARDIAC CATHETERIZATION N/A 11/27/2015   Procedure: Left Heart Cath and Coronary Angiography;  Surgeon: Mallie Mussel  Nicholes Stairs, MD;  Location: [facility];  Service: Cardiovascular;  Laterality: N/A;   COLONOSCOPY N/A 09/01/2012   Procedure: COLONOSCOPY;  Surgeon: [name], MD;  Location: [facility];  Service: Endoscopy;  Laterality: N/A;  1030   CYSTOGRAPHY     ESOPHAGOGASTRODUODENOSCOPY N/A 07/12/2015   Procedure:  ESOPHAGOGASTRODUODENOSCOPY (EGD);  Surgeon: [name], MD;  Location: [facility];  Service: Endoscopy;  Laterality: N/A;  2:50 - moved to 1:00 - Ann to notify   [place]     "to relieve glaucoma"   ROTATOR CUFF REPAIR     bilateral shoulders   SP FL GUIDE SPINAL INJ  May 2017    Social History:  reports that he quit smoking about 35 years ago. His smoking use included cigarettes. He has a 30.00 pack-year smoking history. He has never used smokeless tobacco. He reports current alcohol use. He reports that he does not use drugs.  Allergies  Allergen Reactions   Demeclocycline Rash    Family History  Problem Relation Age of Onset   Breast cancer Mother    Hypertension Mother    Kidney Stones Brother      Prior to Admission medications   Medication Sig Start Date End Date Taking? Authorizing Provider  aspirin EC 81 MG tablet Take 81 mg by mouth daily.    [provider]  atorvastatin (LIPITOR) 20 MG tablet Take 1 tablet by mouth daily. 05/24/18   [provider]  busPIRone (BUSPAR) 10 MG tablet Take 1 tablet by mouth 2 (two) times a day. 11/17/18   [provider]  cefTRIAXone 2 g in sodium chloride 0.9 % 100 mL Inject 2 g into the vein daily. 01/13/19   Charlynne Cousins, MD  Cholecalciferol (VITAMIN D3) 125 MCG (5000 UT) TABS Take 1 capsule by mouth daily.    [provider]  cyclobenzaprine (FLEXERIL) 10 MG tablet Take 1 tablet (10 mg total) by mouth 3 (three) times daily as needed for muscle spasms. 01/13/19   Charlynne Cousins, MD  fluticasone (FLONASE) 50 MCG/ACT nasal spray INHALE 2 SPRAYS IN EACH NOSTRIL ONCE DAILY. 07/12/18   [name], MD  gabapentin (NEURONTIN) 300 MG capsule Take 300-600 mg by mouth 3 (three) times daily. Patient takes 1 capsule in the morning, 1 capsule in the evening, and 2 capsules at night    [provider]  latanoprost (XALATAN) 0.005 % ophthalmic solution Place 1 drop into  both eyes at bedtime. 05/26/18   [provider]  Misc Natural Products (COLON CLEANSE PO) Take 1 capsule by mouth daily as needed (constipation).    [provider]  Multiple Vitamin (MULITIVITAMIN WITH MINERALS) TABS Take 1 tablet by mouth daily.    [provider]  mupirocin ointment (BACTROBAN) 2 % Place 1 application into the nose at bedtime. For dryness from CPAP    [provider]  pantoprazole (PROTONIX) 40 MG tablet TAKE (1) TABLET BY MOUTH TWICE DAILY BEFORE MEALS (BREAKFAST AND SUPPER). 07/14/17   Setzer, Rona Ravens, NP  Polyethyl Glycol-Propyl Glycol (SYSTANE OP) Place 1 drop into both eyes daily as needed (seasonal allergies/ dry eyes).    [provider]  Probiotic Product (PROBIOTIC PO) Take 1 tablet by mouth daily.    [provider]  tamsulosin (FLOMAX) 0.4 MG CAPS capsule Take 0.4 mg by mouth daily after breakfast.  07/13/13   [provider]  tiZANidine (ZANAFLEX) 4 MG tablet Take 4 mg by mouth  every 8 (eight) hours as needed for muscle spasms.    [provider]  venlafaxine XR (EFFEXOR-XR) 75 MG 24 hr capsule TAKE 1 CAPSULE ONCE A DAY WITH BREAKFAST. 04/27/18   [name], MD  zolpidem (AMBIEN) 10 MG tablet Take 1 tablet (10 mg total) by mouth at bedtime as needed for up to 30 days for sleep. 10/28/18 12/19/18  [name], MD    Physical Exam: Vitals:   01/24/19 1900 01/24/19 1930 01/24/19 2000 01/24/19 2045  BP: (!) 152/89 (!) 146/87 (!) 138/101 127/85  Pulse: (!) 103 80 (!) 109 94  Resp: (!) 24 (!) 29 18 (!) 24  Temp:      TempSrc:      SpO2: 97% 96% 96% 97%    Constitutional: Resting supine in bed, NAD, calm, comfortable Eyes: PERRL, lids and conjunctivae normal ENMT: Mucous membranes are moist. Posterior pharynx clear of any exudate or lesions.Normal dentition.  Neck: normal, supple, no masses. Respiratory: Distant breath sounds, clear to auscultation bilaterally, no wheezing, no crackles.   Intermittently increased respiratory effort. No accessory muscle use.  Cardiovascular: Tachycardic and rhythm, no murmurs / rubs / gallops. No extremity edema. 2+ pedal pulses. Abdomen: Mild generalized tenderness, no masses palpated. No hepatosplenomegaly. Bowel sounds positive.  Musculoskeletal: PICC line in place RUE.  No clubbing / cyanosis. No joint deformity upper and lower extremities. Good ROM, no contractures. Normal muscle tone.  Skin: Ecchymosis left upper extremity. Neurologic: CN 2-12 grossly intact. Sensation intact, Strength 5/5 in all 4.  Psychiatric: Normal judgment and insight. Alert and oriented x 3. Normal mood.     Labs on Admission: I have personally reviewed following labs and imaging studies  CBC: Recent Labs  Lab 01/24/19 1604  WBC 6.4  NEUTROABS 4.9  HGB 11.6*  HCT 36.1*  MCV 99.2  PLT 536   Basic Metabolic Panel: Recent Labs  Lab 01/24/19 1604  NA 142  K 2.9*  CL 106  CO2 25  GLUCOSE 104*  BUN 6*  CREATININE 0.65  CALCIUM 8.8*   GFR: Estimated Creatinine Clearance: 81.5 mL/min (by C-G formula based on SCr of 0.65 mg/dL). Liver Function Tests: Recent Labs  Lab 01/24/19 1604  AST 33  ALT 58*  ALKPHOS 65  BILITOT 0.4  PROT 6.2*  ALBUMIN 3.4*   No results for input(s): LIPASE, AMYLASE in the last 168 hours. No results for input(s): AMMONIA in the last 168 hours. Coagulation Profile: No results for input(s): INR, PROTIME in the last 168 hours. Cardiac Enzymes: No results for input(s): CKTOTAL, CKMB, CKMBINDEX, TROPONINI in the last 168 hours. BNP (last 3 results) No results for input(s): PROBNP in the last 8760 hours. HbA1C: No results for input(s): HGBA1C in the last 72 hours. CBG: No results for input(s): GLUCAP in the last 168 hours. Lipid Profile: Recent Labs    01/24/19 1604  TRIG 76   Thyroid Function Tests: No results for input(s): TSH, T4TOTAL, FREET4, T3FREE, THYROIDAB in the last 72 hours. Anemia Panel: Recent Labs      01/24/19 1604  FERRITIN 435*   Urine analysis:    Component Value Date/Time   COLORURINE YELLOW 12/18/2018 1527   APPEARANCEUR HAZY (A) 12/18/2018 1527   LABSPEC 1.019 12/18/2018 1527   PHURINE 6.0 12/18/2018 1527   GLUCOSEU NEGATIVE 12/18/2018 1527   HGBUR NEGATIVE 12/18/2018 1527   BILIRUBINUR NEGATIVE 12/18/2018 1527   KETONESUR NEGATIVE 12/18/2018 1527   PROTEINUR 30 (A) 12/18/2018 1527   UROBILINOGEN 0.2 02/06/2015 1353  NITRITE NEGATIVE 12/18/2018 1527   LEUKOCYTESUR NEGATIVE 12/18/2018 1527    Radiological Exams on Admission: Dg Chest Port 1 View  Result Date: 01/24/2019 CLINICAL DATA:  Positive covid, pneumonia EXAM: PORTABLE CHEST 1 VIEW COMPARISON:  January 05, 2019 CT, radiograph January 01, 2019 FINDINGS: There is a right-sided PICC catheter with the tip seen overlying the mid SVC. Patchy airspace opacities are seen throughout both lungs slightly improved since the prior exam. Again noted is mild cardiomegaly. No pleural effusion are seen. No acute osseous injury. IMPRESSION: 1. Slight interval improvement in the bilateral airspace opacities. 2. Mild cardiomegaly Electronically Signed   By: Prudencio Pair M.D.   On: 01/24/2019 16:51    EKG: Independently reviewed. Sinus tachycardia with wandering lead in V1, no acute ischemic changes.  Assessment/Plan Principal Problem:   Acute respiratory failure with hypoxia (HCC) Active Problems:   Anxiety   Hypokalemia   History of ETT  Joel Greene is a 68 y.o. male with medical history significant for hypertension, hyperlipidemia, depression/anxiety and recent admission for hypoxic respiratory failure due to COVID-19 infection and sepsis bacteremia from strep pneumococcal pneumonia who is admitted with acute respiratory failure with hypoxia in setting of known COVID-19 infection.   Acute respiratory failure with hypoxia in setting of COVID-19 infection: Recent admit 12/18/2018-01/13/2019 for same requiring intubation.  Received IV  iron of severe, IV steroids, Actemra x1, and convalescent plasma.  Hospital course was complicated with strep pneumococcal pneumonia and bacteremia.  Was discharged to rehab on room air.  Patient returns with exertional dyspnea with hypoxia and cough initially productive of brown sputum.  Suspect symptoms are residual effect from viral infection.  Chest x-ray with interval improvement compared to prior.  Doubt recurrent bacterial pneumonia with negative procalcitonin and normal white count.  Inflammatory markers are all improved when compared to previous. -Admit to [facility] -Will start IV Solu-Medrol 40 mg twice daily for now -Continue supplemental oxygen and wean off as able -Continue incentive spirometer, flutter valve, Combivent -Continue antitussives and vitamin C, zinc -Daily labs ordered  COVID-19 Labs  Recent Labs    01/24/19 1604  DDIMER 0.70*  FERRITIN 435*  LDH 226*  CRP <0.8    Lab Results  Component Value Date   SARSCOV2NAA POSITIVE (A) 01/24/2019   SARSCOV2NAA POSITIVE (A) 12/18/2018    Recent sepsis due to strep pneumo bacteremia and pneumonia: Completed 14-day course with IV ceftriaxone via RUE PICC line.  Also received Levaquin x3 days at rehab facility prior to admission due to concern for bacterial pneumonia.  No evidence for acute bacterial infection at time of admission.  Will hold further antibiotics.  Can likely remove PICC line. -Follow-up repeat blood cultures  Hypokalemia: K2.9 on admission.  Received oral supplement in ED. -Check magnesium, replete as needed -Give additional oral potassium now  Hypertension: Intermittently hypertensive on admission.  Not on antihypertensives as an outpatient. -IV hydralazine as needed  Hyperlipidemia: Continue atorvastatin.  GERD: Continue Protonix.  BPH: Continue Flomax.  Anxiety/depression: Continue home BuSpar and Effexor XR.   DVT prophylaxis: Lovenox Code Status: Full code, confirmed with  patient on admission Family Communication: Discussed with patient, he will discuss with family Disposition Plan: Admit to [employer] called: None Admission status: Admit - It is my clinical opinion that admission to INPATIENT is reasonable and necessary because of the expectation that this patient will require hospital care that crosses at least 2 midnights to treat this condition based on the medical complexity  of the problems presented.  Given the aforementioned information, the predictability of an adverse outcome is felt to be significant.      Zada Finders MD [facility]  If 7PM-7AM, please contact night-coverage [email or web address]  01/24/2019, 8:57 PM

## 2019-01-24 NOTE — ED Notes (Signed)
[facility] called. 

## 2019-01-24 NOTE — ED Provider Notes (Signed)
[facility] DEPT Provider Note   CSN: [id number] Arrival date & time: 01/24/19  1516     History   Chief Complaint Chief Complaint  Patient presents with  . Pneumonia    HPI Joel Greene is a 68 y.o. male.  He was diagnosed with Covid last month.  He required intubation at that time and has been at rehab for the last few weeks.  He says he has had increased shortness of breath over the last week and they did a chest x-ray that showed pneumonia.  He said he has been on Levaquin for 3 days but not getting any better.  Dyspnea on exertion.  Sternal chest pain with deep breath.  Cough with some brown now yellow phlegm.  No fevers.  Eating and drinking well no vomiting or diarrhea.  No urinary symptoms.  He did not have any repeat Covid testing to see if he cleared.     The history is provided by the patient.  Pneumonia This is a new problem. The current episode started more than 2 days ago. The problem occurs constantly. The problem has not changed since onset.Associated symptoms include chest pain and shortness of breath. Pertinent negatives include no abdominal pain and no headaches. The symptoms are aggravated by exertion. The symptoms are relieved by rest. He has tried rest for the symptoms. The treatment provided mild relief.    Past Medical History:  Diagnosis Date  . Bronchitis   . DJD (degenerative joint disease)   . Glaucoma   . History of tobacco abuse 10/23/2011   History of smoking for 10 years, a pack per day, quitting at age 52.  . IBS (irritable bowel syndrome)   . Leukopenia 10/23/2011   Decrease in lymphocytes dating back to 1995.  . PUD (peptic ulcer disease) Jan 2017   endoscopy  . Renal calculi   . Sleep apnea    on C-pap    Patient Active Problem List   Diagnosis Date Noted  . Encounter for intubation   . History of ETT   . Hypokalemia 12/19/2018  . Acute respiratory failure with hypoxia ([place]) 12/18/2018  . Sepsis due to  undetermined organism ([place]) 12/18/2018  . Pneumonia due to COVID-19 virus 12/18/2018  . Acute respiratory failure with hypoxemia ([place]) 12/18/2018  . Non-cardiac chest pain 11/28/2015  . CVA tenderness 11/27/2015  . Acute coronary syndrome ([place])   . Elevated troponin 11/25/2015  . SOB (shortness of breath) 11/25/2015  . PUD (peptic ulcer disease) 11/25/2015  . Uncontrolled hypertension 11/25/2015  . DJD- spine injection 11/07/15 11/25/2015  . Bilateral flank pain 11/25/2015  . Nephrolithiasis 11/25/2015  . Anxiety 11/25/2015  . GERD (gastroesophageal reflux disease) 07/31/2015  . Abdominal pain 06/07/2015  . Leukopenia 10/23/2011  . History of tobacco abuse 10/23/2011    Past Surgical History:  Procedure Laterality Date  . APPENDECTOMY    . CARDIAC CATHETERIZATION N/A 11/27/2015   Procedure: Left Heart Cath and Coronary Angiography;  Surgeon: Belva Crome, MD;  Location: [facility];  Service: Cardiovascular;  Laterality: N/A;  . COLONOSCOPY N/A 09/01/2012   Procedure: COLONOSCOPY;  Surgeon: [name], MD;  Location: [facility];  Service: Endoscopy;  Laterality: N/A;  1030  . CYSTOGRAPHY    . ESOPHAGOGASTRODUODENOSCOPY N/A 07/12/2015   Procedure: ESOPHAGOGASTRODUODENOSCOPY (EGD);  Surgeon: [name], MD;  Location: [facility];  Service: Endoscopy;  Laterality: N/A;  2:50 - moved to 1:00 - Ann to notify  .  REFRACTIVE SURGERY     "to relieve glaucoma"  . ROTATOR CUFF REPAIR     bilateral shoulders  . SP FL GUIDE SPINAL INJ  May 2017        Home Medications    Prior to Admission medications   Medication Sig Start Date End Date Taking? Authorizing Provider  aspirin EC 81 MG tablet Take 81 mg by mouth daily.    [provider]  atorvastatin (LIPITOR) 20 MG tablet Take 1 tablet by mouth daily. 05/24/18   [provider]  busPIRone (BUSPAR) 10 MG tablet Take 1 tablet by mouth 2 (two) times a day. 11/17/18   [provider]   cefTRIAXone 2 g in sodium chloride 0.9 % 100 mL Inject 2 g into the vein daily. 01/13/19   Charlynne Cousins, MD  Cholecalciferol (VITAMIN D3) 125 MCG (5000 UT) TABS Take 1 capsule by mouth daily.    [provider]  cyclobenzaprine (FLEXERIL) 10 MG tablet Take 1 tablet (10 mg total) by mouth 3 (three) times daily as needed for muscle spasms. 01/13/19   Charlynne Cousins, MD  fluticasone (FLONASE) 50 MCG/ACT nasal spray INHALE 2 SPRAYS IN EACH NOSTRIL ONCE DAILY. 07/12/18   [name], MD  gabapentin (NEURONTIN) 300 MG capsule Take 300-600 mg by mouth 3 (three) times daily. Patient takes 1 capsule in the morning, 1 capsule in the evening, and 2 capsules at night    [provider]  latanoprost (XALATAN) 0.005 % ophthalmic solution Place 1 drop into both eyes at bedtime. 05/26/18   [provider]  Misc Natural Products (COLON CLEANSE PO) Take 1 capsule by mouth daily as needed (constipation).    [provider]  Multiple Vitamin (MULITIVITAMIN WITH MINERALS) TABS Take 1 tablet by mouth daily.    [provider]  mupirocin ointment (BACTROBAN) 2 % Place 1 application into the nose at bedtime. For dryness from CPAP    [provider]  pantoprazole (PROTONIX) 40 MG tablet TAKE (1) TABLET BY MOUTH TWICE DAILY BEFORE MEALS (BREAKFAST AND SUPPER). 07/14/17   Setzer, Rona Ravens, NP  Polyethyl Glycol-Propyl Glycol (SYSTANE OP) Place 1 drop into both eyes daily as needed (seasonal allergies/ dry eyes).    [provider]  Probiotic Product (PROBIOTIC PO) Take 1 tablet by mouth daily.    [provider]  tamsulosin (FLOMAX) 0.4 MG CAPS capsule Take 0.4 mg by mouth daily after breakfast.  07/13/13   [provider]  tiZANidine (ZANAFLEX) 4 MG tablet Take 4 mg by mouth every 8 (eight) hours as needed for muscle spasms.    [provider]  venlafaxine XR (EFFEXOR-XR) 75 MG 24 hr capsule TAKE 1 CAPSULE ONCE A DAY WITH  BREAKFAST. 04/27/18   [name], MD  zolpidem (AMBIEN) 10 MG tablet Take 1 tablet (10 mg total) by mouth at bedtime as needed for up to 30 days for sleep. 10/28/18 12/19/18  [name], MD    Family History Family History  Problem Relation Age of Onset  . Breast cancer Mother   . Hypertension Mother   . Kidney Stones Brother     Social History Social History   Tobacco Use  . Smoking status: Former Smoker    Packs/day: 2.00    Years: 15.00    Pack years: 30.00    Types: Cigarettes    Quit date: 07/01/1983    Years since quitting: 35.5  . Smokeless tobacco: Never Used  Substance Use Topics  .  Alcohol use: Yes    Alcohol/week: 0.0 standard drinks    Comment: occasional  . Drug use: No     Allergies   Clams [shellfish allergy], Demeclocycline, and Tetracyclines & related   Review of Systems Review of Systems  Constitutional: Negative for chills and fever.  HENT: Negative for sore throat.   Eyes: Negative for visual disturbance.  Respiratory: Positive for shortness of breath.   Cardiovascular: Positive for chest pain. Negative for leg swelling.  Gastrointestinal: Negative for abdominal pain, nausea and vomiting.  Genitourinary: Negative for dysuria.  Musculoskeletal: Negative for neck pain.  Skin: Negative for rash.  Neurological: Negative for headaches.     Physical Exam Updated Vital Signs BP (!) 146/101 (BP Location: Left Arm)   Pulse 88   Temp 98.4 F (36.9 C) (Oral)   Resp (!) 23   SpO2 99%   Physical Exam Vitals signs and nursing note reviewed.  Constitutional:      Appearance: He is well-developed.  HENT:     Head: Normocephalic and atraumatic.  Eyes:     Conjunctiva/sclera: Conjunctivae normal.  Neck:     Musculoskeletal: Neck supple.  Cardiovascular:     Rate and Rhythm: Normal rate and regular rhythm.     Heart sounds: No murmur.  Pulmonary:     Effort: Tachypnea and accessory muscle usage present. No respiratory distress.     Breath  sounds: Normal breath sounds.  Abdominal:     Palpations: Abdomen is soft.     Tenderness: There is no abdominal tenderness.  Musculoskeletal: Normal range of motion.     Right lower leg: No edema.     Left lower leg: No edema.  Skin:    General: Skin is warm and dry.     Capillary Refill: Capillary refill takes less than 2 seconds.  Neurological:     General: No focal deficit present.     Mental Status: He is alert and oriented to person, place, and time.     Sensory: No sensory deficit.     Motor: No weakness.      ED Treatments / Results  Labs (all labs ordered are listed, but only abnormal results are displayed) Labs Reviewed  SARS CORONAVIRUS 2 ([facility]) - Abnormal; Notable for the following components:      Result Value   SARS Coronavirus 2 POSITIVE (*)    All other components within normal limits  CBC WITH DIFFERENTIAL/PLATELET - Abnormal; Notable for the following components:   RBC 3.64 (*)    Hemoglobin 11.6 (*)    HCT 36.1 (*)    All other components within normal limits  COMPREHENSIVE METABOLIC PANEL - Abnormal; Notable for the following components:   Potassium 2.9 (*)    Glucose, Bld 104 (*)    BUN 6 (*)    Calcium 8.8 (*)    Total Protein 6.2 (*)    Albumin 3.4 (*)    ALT 58 (*)    All other components within normal limits  D-DIMER, QUANTITATIVE (NOT AT [facility]) - Abnormal; Notable for the following components:   D-Dimer, Quant 0.70 (*)    All other components within normal limits  LACTATE DEHYDROGENASE - Abnormal; Notable for the following components:   LDH 226 (*)    All other components within normal limits  FERRITIN - Abnormal; Notable for the following components:   Ferritin 435 (*)    All other components within normal limits  CBC WITH DIFFERENTIAL/PLATELET -  Abnormal; Notable for the following components:   RBC 3.55 (*)    Hemoglobin 11.6 (*)    HCT 35.1 (*)    All other components within normal  limits  COMPREHENSIVE METABOLIC PANEL - Abnormal; Notable for the following components:   BUN 6 (*)    Calcium 8.4 (*)    Total Protein 5.7 (*)    Albumin 3.1 (*)    ALT 60 (*)    All other components within normal limits  CK - Abnormal; Notable for the following components:   Total CK 15 (*)    All other components within normal limits  FERRITIN - Abnormal; Notable for the following components:   Ferritin 392 (*)    All other components within normal limits  PHOSPHORUS - Abnormal; Notable for the following components:   Phosphorus 5.1 (*)    All other components within normal limits  GLUCOSE, CAPILLARY - Abnormal; Notable for the following components:   Glucose-Capillary 100 (*)    All other components within normal limits  CULTURE, BLOOD (ROUTINE X 2)  CULTURE, BLOOD (ROUTINE X 2)  LACTIC ACID, PLASMA  PROCALCITONIN  TRIGLYCERIDES  FIBRINOGEN  C-REACTIVE PROTEIN  BRAIN NATRIURETIC PEPTIDE  C-REACTIVE PROTEIN  D-DIMER, QUANTITATIVE (NOT AT [facility])  MAGNESIUM  HIV ANTIBODY (ROUTINE TESTING W REFLEX)  ABO/RH    EKG EKG Interpretation  Date/Time:  Monday January 24 2019 16:16:34 EDT Ventricular Rate:  103 PR Interval:    QRS Duration: 88 QT Interval:  344 QTC Calculation: 451 R Axis:   26 Text Interpretation:  Sinus tachycardia Baseline wander in lead(s) V1 similar to prior 7/20 Confirmed by Aletta Edouard [phone]) on 01/24/2019 4:29:46 PM Also confirmed by Aletta Edouard [phone]), editor Philomena Doheny [phone])  on 01/25/2019 7:05:35 AM   Radiology Dg Chest Port 1 View  Result Date: 01/24/2019 CLINICAL DATA:  Positive covid, pneumonia EXAM: PORTABLE CHEST 1 VIEW COMPARISON:  January 05, 2019 CT, radiograph January 01, 2019 FINDINGS: There is a right-sided PICC catheter with the tip seen overlying the mid SVC. Patchy airspace opacities are seen throughout both lungs slightly improved since the prior exam. Again noted is mild cardiomegaly. No pleural effusion are seen. No acute osseous  injury. IMPRESSION: 1. Slight interval improvement in the bilateral airspace opacities. 2. Mild cardiomegaly Electronically Signed   By: Prudencio Pair M.D.   On: 01/24/2019 16:51    Procedures Procedures (including critical care time)  Medications Ordered in ED Medications - No data to display   Initial Impression / Assessment and Plan / ED Course  I have reviewed the triage vital signs and the nursing notes.  Pertinent labs & imaging results that were available during my care of the patient were reviewed by me and considered in my medical decision making (see chart for details).  Clinical Course as of Jan 25 1252  Mon Jul 27, 323  4060 68 year old male smoker here with increased shortness of breath in the setting of a recent Covid pneumonia that required intubation.  Differential includes Covid pneumonia, COPD exacerbation, pneumonia, ACS   [MB]  1908 Patient is Covid positive.  Labs are showing a low sodium of 2.9 and importance of oral repletion.  He is also more anemic than his prior values.  Inflammatory markers are mixed with a normal CRP and procalcitonin and lactic acid but elevated d-dimer LDH ferritin.   [MB]  1915 I updated the patient on his results of his Covid testing and his blood work and chest x-ray report.  The chest x-ray did end up crossing in epic but they say his opacities seem improved from his prior x-ray.  He still short of breath with any type of exertion and so I put a call into the hospitalist to review all this with him to see if he would benefit from admission.   [MB]  1925 Discussed with Dr. Posey Pronto from the hospitalist service who will evaluate the patient for admission.   [MB]    Clinical Course User Index [MB] Hayden Rasmussen, MD   RANE BLITCH was evaluated in Emergency Department on 01/24/2019 for the symptoms described in the history of present illness. He was evaluated in the context of the global COVID-19 pandemic, which necessitated consideration  that the patient might be at risk for infection with the SARS-CoV-2 virus that causes COVID-19. Institutional protocols and algorithms that pertain to the evaluation of patients at risk for COVID-19 are in a state of rapid change based on information released by regulatory bodies including the CDC and federal and state organizations. These policies and algorithms were followed during the patient's care in the ED.      Final Clinical Impressions(s) / ED Diagnoses   Final diagnoses:  Pneumonia due to COVID-19 virus  DOE (dyspnea on exertion)  Hypokalemia    ED Discharge Orders    None       Hayden Rasmussen, MD 01/25/19 1255

## 2019-01-25 ENCOUNTER — Inpatient Hospital Stay (HOSPITAL_COMMUNITY): Payer: Medicare Other

## 2019-01-25 DIAGNOSIS — F419 Anxiety disorder, unspecified: Secondary | ICD-10-CM

## 2019-01-25 DIAGNOSIS — E876 Hypokalemia: Secondary | ICD-10-CM

## 2019-01-25 LAB — CBC WITH DIFFERENTIAL/PLATELET
Abs Immature Granulocytes: 0.04 10*3/uL (ref 0.00–0.07)
Basophils Absolute: 0.1 10*3/uL (ref 0.0–0.1)
Basophils Relative: 2 %
Eosinophils Absolute: 0.1 10*3/uL (ref 0.0–0.5)
Eosinophils Relative: 3 %
HCT: 35.1 % — ABNORMAL LOW (ref 39.0–52.0)
Hemoglobin: 11.6 g/dL — ABNORMAL LOW (ref 13.0–17.0)
Immature Granulocytes: 1 %
Lymphocytes Relative: 22 %
Lymphs Abs: 0.9 10*3/uL (ref 0.7–4.0)
MCH: 32.7 pg (ref 26.0–34.0)
MCHC: 33 g/dL (ref 30.0–36.0)
MCV: 98.9 fL (ref 80.0–100.0)
Monocytes Absolute: 0.4 10*3/uL (ref 0.1–1.0)
Monocytes Relative: 10 %
Neutro Abs: 2.5 10*3/uL (ref 1.7–7.7)
Neutrophils Relative %: 62 %
Platelets: 258 10*3/uL (ref 150–400)
RBC: 3.55 MIL/uL — ABNORMAL LOW (ref 4.22–5.81)
RDW: 14.5 % (ref 11.5–15.5)
WBC: 4 10*3/uL (ref 4.0–10.5)
nRBC: 0 % (ref 0.0–0.2)

## 2019-01-25 LAB — COMPREHENSIVE METABOLIC PANEL
ALT: 60 U/L — ABNORMAL HIGH (ref 0–44)
AST: 38 U/L (ref 15–41)
Albumin: 3.1 g/dL — ABNORMAL LOW (ref 3.5–5.0)
Alkaline Phosphatase: 59 U/L (ref 38–126)
Anion gap: 11 (ref 5–15)
BUN: 6 mg/dL — ABNORMAL LOW (ref 8–23)
CO2: 25 mmol/L (ref 22–32)
Calcium: 8.4 mg/dL — ABNORMAL LOW (ref 8.9–10.3)
Chloride: 107 mmol/L (ref 98–111)
Creatinine, Ser: 0.62 mg/dL (ref 0.61–1.24)
GFR calc Af Amer: >60 mL/min (ref >60)
GFR calc non Af Amer: >60 mL/min (ref >60)
Glucose, Bld: 98 mg/dL (ref 70–99)
Potassium: 3.6 mmol/L (ref 3.5–5.1)
Sodium: 143 mmol/L (ref 135–145)
Total Bilirubin: 0.5 mg/dL (ref 0.3–1.2)
Total Protein: 5.7 g/dL — ABNORMAL LOW (ref 6.5–8.1)

## 2019-01-25 LAB — GLUCOSE, CAPILLARY: Glucose-Capillary: 100 mg/dL — ABNORMAL HIGH (ref 70–99)

## 2019-01-25 LAB — PHOSPHORUS: Phosphorus: 5.1 mg/dL — ABNORMAL HIGH (ref 2.5–4.6)

## 2019-01-25 LAB — C-REACTIVE PROTEIN: CRP: <0.8 mg/dL (ref <1.0)

## 2019-01-25 LAB — FERRITIN: Ferritin: 392 ng/mL — ABNORMAL HIGH (ref 24–336)

## 2019-01-25 LAB — MAGNESIUM: Magnesium: 1.8 mg/dL (ref 1.7–2.4)

## 2019-01-25 LAB — D-DIMER, QUANTITATIVE: D-Dimer, Quant: 0.47 ug/mL-FEU (ref 0.00–0.50)

## 2019-01-25 LAB — ABO/RH: ABO/RH(D): O NEG

## 2019-01-25 LAB — HIV ANTIBODY (ROUTINE TESTING W REFLEX): HIV Screen 4th Generation wRfx: NONREACTIVE

## 2019-01-25 LAB — CK: Total CK: 15 U/L — ABNORMAL LOW (ref 49–397)

## 2019-01-25 MED ORDER — IOHEXOL 350 MG/ML SOLN
100.0000 mL | Freq: Once | INTRAVENOUS | Status: AC | PRN
  Administered 2019-01-25: 15:00:00 100 mL via INTRAVENOUS

## 2019-01-25 MED ORDER — GABAPENTIN 300 MG PO CAPS
600.0000 mg | ORAL_CAPSULE | Freq: Every day | ORAL | Status: DC
  Administered 2019-01-25 (×2): 600 mg via ORAL
  Filled 2019-01-25 (×2): qty 2

## 2019-01-25 MED ORDER — GABAPENTIN 300 MG PO CAPS
300.0000 mg | ORAL_CAPSULE | Freq: Two times a day (BID) | ORAL | Status: DC
  Administered 2019-01-25 – 2019-01-26 (×4): 300 mg via ORAL
  Filled 2019-01-25 (×3): qty 1

## 2019-01-25 MED ORDER — ALPRAZOLAM 0.25 MG PO TABS
0.2500 mg | ORAL_TABLET | Freq: Three times a day (TID) | ORAL | Status: DC | PRN
  Administered 2019-01-25: 0.5 mg via ORAL
  Filled 2019-01-25: qty 2

## 2019-01-25 MED ORDER — ACETAMINOPHEN 325 MG PO TABS
650.0000 mg | ORAL_TABLET | Freq: Four times a day (QID) | ORAL | Status: DC | PRN
  Administered 2019-01-25 – 2019-01-26 (×4): 650 mg via ORAL
  Filled 2019-01-25 (×4): qty 2

## 2019-01-25 MED ORDER — TRAZODONE HCL 50 MG PO TABS
50.0000 mg | ORAL_TABLET | Freq: Every evening | ORAL | Status: DC | PRN
  Administered 2019-01-25 (×2): 50 mg via ORAL
  Filled 2019-01-25 (×2): qty 1

## 2019-01-25 NOTE — Progress Notes (Signed)
PROGRESS NOTE  Joel Greene  WUJ:[id number] DOB: 1950-11-03 DOA: 01/24/2019 PCP: Redmond School, MD, patient interested in establishing care elsewhere. Brief Narrative: Joel Greene is a 68 y.o. male with a history of HTN, HLD, anxiety and recent admission for covid-19 pneumonia requiring intubation complicated by pneumococcal pneumonia and bacteremia and severe anxiety. He received Steroids, tocilizumab, convalescent plasma, and remdesivir, ultimately discharged with PICC line to complete 14 days of ceftriaxone at SNF, to which he was discharged with no supplemental oxygen requirement. He was discharged without supplemental oxygen or prn anxiety medication. He continued to have cough but was progressing with PT. He was given levaquin and supplemental oxygen for report of PNA on CXR, but was ultimately transported to the ED for evaluation. SpO2 documented as 99% on 3L O2, RR 22/min, afebrile. CXR showed improving infiltrates without focal consolidation, inflammatory markers had trended downward, ECG nonischemic and BNP normal, appearing euvolemic. He was given steroids and readmitted to [facility]. D-dimer is modestly elevated, so CTA chest to rule out PE is planned, and anti-anxiety medication has been restarted.   Assessment & Plan: Principal Problem:   Acute respiratory failure with hypoxia (HCC) Active Problems:   Anxiety   Hypokalemia   History of ETT  Acute hypoxic respiratory failure: Unclear precipitant of worsening. Recrudescence of covid not felt to be likely with improving CXR, normalizing inflammatory markers. No evidence of CHF, BNP 48.7. Procalcitonin undetectable, further suggesting no new pneumonia despite his history of pneumococcal pneumonia/bacteremia and the use of tocilizumab. ECG is unchanged without ischemic features and patient has no exertional chest pain. D-dimer is modestly elevated at admission yesterday at 0.70.  - CTA chest r/o PE.  - Treat anxiety as below - Doubt there  is a role for steroids, and this may worsen anxiety. Will stop.   History of Pneumococcal bacteremia, pneumonia: No complication on CT during last hospitalization, repeat blood cultures remained negative.  - Pull RUE PICC (placed 7/13)  Anxiety: Severe, impacting current respiratory status.  - Restart on xanax 0.25mg  prn. He had been on this for years and required precedex for a time during his last hospitalization. Would recommend continuing this low dose at discharge.  - Continue other home medications  Hypokalemia: Improved with supplementation  HLD:  - Continue statin  GERD:  - Continue PPI  DVT prophylaxis: Lovenox Code Status: Full Family Communication: None at bedside Disposition Plan: Return to SNF once work up complete, likely 7/29. PT/OT/CSW consulted. May require supplemental oxygen for a while.   Consultants:   None  Procedures:   None  Antimicrobials:  None   Subjective: Feels anxiety has played a large role in respiratory problems, felt he was progressing with PT at the facility but had worsening dyspnea over previous 3-4 days. Not abrupt. Has anterior pleuritic chest pain which is not exertional. No leg swelling.   Objective: Vitals:   01/25/19 0417 01/25/19 0500 01/25/19 0600 01/25/19 0757  BP:  (!) 128/91 (!) 162/91   Pulse:  85 88   Resp:  18 (!) 23   Temp: 98.1 F (36.7 C)   (!) 97.5 F (36.4 C)  TempSrc: Oral   Oral  SpO2:  99% 99%     Intake/Output Summary (Last 24 hours) at 01/25/2019 1201 Last data filed at 01/25/2019 0936 Gross per 24 hour  Intake -  Output 775 ml  Net -775 ml   There were no vitals filed for this visit.  Gen: Frail, pleasant male in no distress sitting  in chair. Pulm: Non-labored breathing supplemental oxygen. Clear to auscultation bilaterally.  CV: Regular tachycardia. No murmur, rub, or gallop. No JVD, no pedal edema. GI: Abdomen soft, non-tender, non-distended, with normoactive bowel sounds. No organomegaly or  masses felt. Ext: Warm, no deformities Skin: No rashes, lesions or ulcers Neuro: Alert and oriented. No focal neurological deficits. Psych: Judgement and insight appear normal. Mood & affect appropriate.   Data Reviewed: I have personally reviewed following labs and imaging studies  CBC: Recent Labs  Lab 01/24/19 1604 01/25/19 0640  WBC 6.4 4.0  NEUTROABS 4.9 2.5  HGB 11.6* 11.6*  HCT 36.1* 35.1*  MCV 99.2 98.9  PLT 256 132   Basic Metabolic Panel: Recent Labs  Lab 01/24/19 1604 01/25/19 0640  NA 142 143  K 2.9* 3.6  CL 106 107  CO2 25 25  GLUCOSE 104* 98  BUN 6* 6*  CREATININE 0.65 0.62  CALCIUM 8.8* 8.4*  MG  --  1.8  PHOS  --  5.1*   GFR: Estimated Creatinine Clearance: 81.5 mL/min (by C-G formula based on SCr of 0.62 mg/dL). Liver Function Tests: Recent Labs  Lab 01/24/19 1604 01/25/19 0640  AST 33 38  ALT 58* 60*  ALKPHOS 65 59  BILITOT 0.4 0.5  PROT 6.2* 5.7*  ALBUMIN 3.4* 3.1*   No results for input(s): LIPASE, AMYLASE in the last 168 hours. No results for input(s): AMMONIA in the last 168 hours. Coagulation Profile: No results for input(s): INR, PROTIME in the last 168 hours. Cardiac Enzymes: Recent Labs  Lab 01/25/19 0640  CKTOTAL 15*   BNP (last 3 results) No results for input(s): PROBNP in the last 8760 hours. HbA1C: No results for input(s): HGBA1C in the last 72 hours. CBG: Recent Labs  Lab 01/25/19 0753  GLUCAP 100*   Lipid Profile: Recent Labs    01/24/19 1604  TRIG 76   Thyroid Function Tests: No results for input(s): TSH, T4TOTAL, FREET4, T3FREE, THYROIDAB in the last 72 hours. Anemia Panel: Recent Labs    01/24/19 1604 01/25/19 0640  FERRITIN 435* 392*   Urine analysis:    Component Value Date/Time   COLORURINE YELLOW 12/18/2018 1527   APPEARANCEUR HAZY (A) 12/18/2018 1527   LABSPEC 1.019 12/18/2018 1527   PHURINE 6.0 12/18/2018 1527   GLUCOSEU NEGATIVE 12/18/2018 1527   HGBUR NEGATIVE 12/18/2018 1527    BILIRUBINUR NEGATIVE 12/18/2018 1527   KETONESUR NEGATIVE 12/18/2018 1527   PROTEINUR 30 (A) 12/18/2018 1527   UROBILINOGEN 0.2 02/06/2015 1353   NITRITE NEGATIVE 12/18/2018 1527   LEUKOCYTESUR NEGATIVE 12/18/2018 1527   Recent Results (from the past 240 hour(s))  SARS Coronavirus 2 [facility] order, Performed in [place] hospital lab)     Status: Abnormal   Collection Time: 01/24/19  4:04 PM   Specimen: Nasopharyngeal Swab  Result Value Ref Range Status   SARS Coronavirus 2 POSITIVE (A) NEGATIVE Final    Comment: RESULT CALLED TO, READ BACK BY AND VERIFIED WITH: Theresia Lo [id number] @ [facility] (NOTE) If result is NEGATIVE SARS-CoV-2 target nucleic acids are NOT DETECTED. The SARS-CoV-2 RNA is generally detectable in upper and lower  respiratory specimens during the acute phase of infection. The lowest  concentration of SARS-CoV-2 viral copies this assay can detect is 250  copies / mL. A negative result does not preclude SARS-CoV-2 infection  and should not be used as the sole basis for treatment or other  patient management decisions.  A negative result may occur with  improper specimen collection / handling, submission of specimen other  than nasopharyngeal swab, presence of viral mutation(s) within the  areas targeted by this assay, and inadequate number of viral copies  (<250 copies / mL). A negative result must be combined with clinical  observations, patient history, and epidemiological information. If result is POSITIVE SARS-CoV-2 target nucleic acids are DETEC TED. The SARS-CoV-2 RNA is generally detectable in upper and lower  respiratory specimens during the acute phase of infection.  Positive  results are indicative of active infection with SARS-CoV-2.  Clinical  correlation with patient history and other diagnostic information is  necessary to determine patient infection status.  Positive results do  not rule out bacterial infection or co-infection with  other viruses. If result is PRESUMPTIVE POSTIVE SARS-CoV-2 nucleic acids MAY BE PRESENT.   A presumptive positive result was obtained on the submitted specimen  and confirmed on repeat testing.  While 2019 novel coronavirus  (SARS-CoV-2) nucleic acids may be present in the submitted sample  additional confirmatory testing may be necessary for epidemiological  and / or clinical management purposes  to differentiate between  SARS-CoV-2 and other Sarbecovirus currently known to infect humans.  If clinically indicated additional testing with an alternate test  methodology (LAB7 453) is advised. The SARS-CoV-2 RNA is generally  detectable in upper and lower respiratory specimens during the acute  phase of infection. The expected result is Negative. Fact Sheet for Patients:  StrictlyIdeas.no Fact Sheet for Healthcare Providers: BankingDealers.co.za This test is not yet approved or cleared by the [place] FDA and has been authorized for detection and/or diagnosis of SARS-CoV-2 by FDA under an Emergency Use Authorization (EUA).  This EUA will remain in effect (meaning this test can be used) for the duration of the COVID-19 declaration under Section 564(b)(1) of the Act, 21 U.S.C. section 360bbb-3(b)(1), unless the authorization is terminated or revoked sooner. Performed at [facility], [place] [address], [place], [place] [address]   Blood Culture (routine x 2)     Status: None (Preliminary result)   Collection Time: 01/24/19  4:04 PM   Specimen: BLOOD LEFT HAND  Result Value Ref Range Status   Specimen Description   Final    BLOOD LEFT HAND Performed at [facility] [address] [place], [place] [address]    Special Requests   Final    BOTTLES DRAWN AEROBIC AND ANAEROBIC Blood Culture adequate volume Performed at [facility] [address], [place], [place] [address]     Culture   Final    NO GROWTH < 12 HOURS Performed at [facility] [address] [place], [place] [address]    Report Status PENDING  Incomplete  Blood Culture (routine x 2)     Status: None (Preliminary result)   Collection Time: 01/24/19  4:09 PM   Specimen: BLOOD  Result Value Ref Range Status   Specimen Description   Final    BLOOD RIGHT ANTECUBITAL Performed at [facility], [place] [address] [place], [place] [address]    Special Requests   Final    BOTTLES DRAWN AEROBIC AND ANAEROBIC Blood Culture adequate volume Performed at [facility] [address] [place], [place] [address]    Culture   Final    NO GROWTH < 12 HOURS Performed at [facility] [address], [place],  [address]    Report Status PENDING  Incomplete  Radiology Studies: Dg Chest Port 1 View  Result Date: 01/24/2019 CLINICAL DATA:  Positive covid, pneumonia EXAM: PORTABLE CHEST 1 VIEW COMPARISON:  January 05, 2019 CT, radiograph January 01, 2019 FINDINGS: There is a right-sided PICC catheter with the tip seen overlying the mid SVC. Patchy airspace opacities are seen throughout both lungs slightly improved since the prior exam. Again noted is mild cardiomegaly. No pleural effusion are seen. No acute osseous injury. IMPRESSION: 1. Slight interval improvement in the bilateral airspace opacities. 2. Mild cardiomegaly Electronically Signed   By: Prudencio Pair M.D.   On: 01/24/2019 16:51    Scheduled Meds: . atorvastatin  20 mg Oral Daily  . busPIRone  10 mg Oral BID  . enoxaparin (LOVENOX) injection  40 mg Subcutaneous QHS  . gabapentin  300 mg Oral BID  . gabapentin  600 mg Oral QHS  . Ipratropium-Albuterol  1 puff Inhalation Q6H  . latanoprost  1 drop Both Eyes QHS  . methylPREDNISolone (SOLU-MEDROL) injection  40 mg Intravenous BID  . pantoprazole  40 mg Oral BID  . potassium chloride  60 mEq Oral Once  . tamsulosin  0.4 mg Oral QPC breakfast  . venlafaxine XR   75 mg Oral Q breakfast  . vitamin C  500 mg Oral Daily  . zinc sulfate  220 mg Oral Daily   Continuous Infusions:   LOS: 1 day   Time spent: 35 minutes.  Patrecia Pour, MD [facility] [email or web address] Password [email or web address] 01/25/2019, 12:01 PM

## 2019-01-25 NOTE — Progress Notes (Signed)
Patient arrived to unit oriented to proning and [facility] policies.

## 2019-01-26 LAB — CBC WITH DIFFERENTIAL/PLATELET
Abs Immature Granulocytes: 0.03 10*3/uL (ref 0.00–0.07)
Basophils Absolute: 0.1 10*3/uL (ref 0.0–0.1)
Basophils Relative: 2 %
Eosinophils Absolute: 0.1 10*3/uL (ref 0.0–0.5)
Eosinophils Relative: 3 %
HCT: 36.7 % — ABNORMAL LOW (ref 39.0–52.0)
Hemoglobin: 11.6 g/dL — ABNORMAL LOW (ref 13.0–17.0)
Immature Granulocytes: 1 %
Lymphocytes Relative: 26 %
Lymphs Abs: 1.1 10*3/uL (ref 0.7–4.0)
MCH: 31.7 pg (ref 26.0–34.0)
MCHC: 31.6 g/dL (ref 30.0–36.0)
MCV: 100.3 fL — ABNORMAL HIGH (ref 80.0–100.0)
Monocytes Absolute: 0.4 10*3/uL (ref 0.1–1.0)
Monocytes Relative: 10 %
Neutro Abs: 2.4 10*3/uL (ref 1.7–7.7)
Neutrophils Relative %: 58 %
Platelets: 277 10*3/uL (ref 150–400)
RBC: 3.66 MIL/uL — ABNORMAL LOW (ref 4.22–5.81)
RDW: 14.4 % (ref 11.5–15.5)
WBC: 4.1 10*3/uL (ref 4.0–10.5)
nRBC: 0 % (ref 0.0–0.2)

## 2019-01-26 LAB — BLOOD CULTURE ID PANEL (REFLEXED)

## 2019-01-26 LAB — COMPREHENSIVE METABOLIC PANEL
ALT: 56 U/L — ABNORMAL HIGH (ref 0–44)
AST: 27 U/L (ref 15–41)
Albumin: 3.3 g/dL — ABNORMAL LOW (ref 3.5–5.0)
Alkaline Phosphatase: 70 U/L (ref 38–126)
Anion gap: 10 (ref 5–15)
BUN: 12 mg/dL (ref 8–23)
CO2: 26 mmol/L (ref 22–32)
Calcium: 8.8 mg/dL — ABNORMAL LOW (ref 8.9–10.3)
Chloride: 106 mmol/L (ref 98–111)
Creatinine, Ser: 0.66 mg/dL (ref 0.61–1.24)
GFR calc Af Amer: >60 mL/min (ref >60)
GFR calc non Af Amer: >60 mL/min (ref >60)
Glucose, Bld: 100 mg/dL — ABNORMAL HIGH (ref 70–99)
Potassium: 3.6 mmol/L (ref 3.5–5.1)
Sodium: 142 mmol/L (ref 135–145)
Total Bilirubin: 0.5 mg/dL (ref 0.3–1.2)
Total Protein: 5.9 g/dL — ABNORMAL LOW (ref 6.5–8.1)

## 2019-01-26 LAB — FERRITIN: Ferritin: 384 ng/mL — ABNORMAL HIGH (ref 24–336)

## 2019-01-26 LAB — D-DIMER, QUANTITATIVE: D-Dimer, Quant: 0.66 ug/mL-FEU — ABNORMAL HIGH (ref 0.00–0.50)

## 2019-01-26 LAB — C-REACTIVE PROTEIN: CRP: <0.8 mg/dL (ref <1.0)

## 2019-01-26 MED ORDER — ALPRAZOLAM 0.25 MG PO TABS: 0.2500 mg | ORAL_TABLET | Freq: Three times a day (TID) | ORAL | 0 refills | Status: DC | PRN

## 2019-01-26 NOTE — Progress Notes (Signed)
Patient discharged via [facility] to [facility].  All personal belongings sent with patient.  Earleen Reaper RN

## 2019-01-26 NOTE — Progress Notes (Signed)
Report attempted x 2 to [facility]

## 2019-01-26 NOTE — Progress Notes (Signed)
PHARMACY - PHYSICIAN COMMUNICATION CRITICAL VALUE ALERT - BLOOD CULTURE IDENTIFICATION (BCID)  Joel Greene is an 68 y.o. male who presented to [facility] on 01/24/2019 with a chief complaint of SOB and weakness due to COVID-19  Assessment:  Pt readmitted after  recent admission for covid-19 pneumonia requiring intubation complicated by pneumococcal pneumonia and bacteremia and severe anxiety  Name of physician (or Provider) Contacted: Dr. Olevia Bowens   Current antibiotics: none  Changes to prescribed antibiotics recommended:  Likely contaminant   Results for orders placed or performed during the hospital encounter of 01/24/19  Blood Culture ID Panel (Reflexed) (Collected: 01/24/2019  4:09 PM)  Result Value Ref Range   Enterococcus species NOT DETECTED NOT DETECTED   Listeria monocytogenes NOT DETECTED NOT DETECTED   Staphylococcus species DETECTED (A) NOT DETECTED   Staphylococcus aureus (BCID) NOT DETECTED NOT DETECTED   Methicillin resistance DETECTED (A) NOT DETECTED   Streptococcus species NOT DETECTED NOT DETECTED   Streptococcus agalactiae NOT DETECTED NOT DETECTED   Streptococcus pneumoniae NOT DETECTED NOT DETECTED   Streptococcus pyogenes NOT DETECTED NOT DETECTED   Acinetobacter baumannii NOT DETECTED NOT DETECTED   Enterobacteriaceae species NOT DETECTED NOT DETECTED   Enterobacter cloacae complex NOT DETECTED NOT DETECTED   Escherichia coli NOT DETECTED NOT DETECTED   Klebsiella oxytoca NOT DETECTED NOT DETECTED   Klebsiella pneumoniae NOT DETECTED NOT DETECTED   Proteus species NOT DETECTED NOT DETECTED   Serratia marcescens NOT DETECTED NOT DETECTED   Haemophilus influenzae NOT DETECTED NOT DETECTED   Neisseria meningitidis NOT DETECTED NOT DETECTED   Pseudomonas aeruginosa NOT DETECTED NOT DETECTED   Candida albicans NOT DETECTED NOT DETECTED   Candida glabrata NOT DETECTED NOT DETECTED   Candida krusei NOT DETECTED NOT DETECTED   Candida parapsilosis NOT DETECTED  NOT DETECTED   Candida tropicalis NOT DETECTED NOT DETECTED     Royetta Asal, PharmD, BCPS 01/26/2019 1:16 AM

## 2019-01-26 NOTE — TOC Transition Note (Signed)
Transition of Care [facility]) - CM/SW Discharge Note   Patient Details  Name: BOLTON CANUPP MRN: [id number] Date of Birth: [birth year]  Transition of Care [facility]) CM/SW Contact:  Ninfa Meeker, RN Phone Number: [phone]( working remotely) 01/26/2019, 11:03 AM   Clinical Narrative:   Patient will DC to: [facility] [place] date: 01/26/19 Family notified: Patient is aware Transported by: [facility]  Case manager has sent discharge summary to Admission Director -Jadene Pierini,  at [facility]. Bedside Nurse to call report to [phone], ask for [facility]. PTAR will be called when Case Manager has been notified that PICC line has been removed.                Patient Goals and CMS Choice        Discharge Placement                       Discharge Plan and Services return to [facility]                                      Social Determinants of Health (SDOH) Interventions     Readmission Risk Interventions No flowsheet data found.

## 2019-01-26 NOTE — Progress Notes (Signed)
Report attempted x 3. Spoke to [employer] of rehab, he sent me to charge nurse line(Rachel) and a voice mail was left for return call

## 2019-01-26 NOTE — Progress Notes (Signed)
Notified by Bella Kennedy- RN-Charge- that PICC line is out and pt ready for transport to [facility] called for transport. Per PTAR there are seven patients on list ahead, pt is on list however it may be awhile for transportHotel manager updated. Paperwork has been printed to unit.

## 2019-01-26 NOTE — Consult Note (Signed)
   [facility] Inpatient Consult   01/26/2019  [name] Dec 23, 1950 [id number]    Patient reviewedifpotential [place] Management services are neededunderhis Medicare/ NextGen plan. Review of patient's medical record revealsthatpatienthas a 30 day readmission, 2 hospitalizations in the past 6 months, with a21%  risk score for unplanned readmissions.  Medical record review and MD note dated 01/24/19, reveal as follows: Joel Greene is a 68 y.o. male with medical history significant for hypertension, hyperlipidemia, depression/ anxiety and recent admission for hypoxic respiratory failure due to COVID-19 infection and sepsis bacteremia from strep pneumococcal pneumonia, who presented to the ED from [facility] for evaluation of dyspnea and productive cough. He was given steroids and readmitted to [facility].The patient's symptoms have improved since anti-anxiety medication was restarted.Inflammatory markers have improved/some normalized and is stable for discharge with supplemental oxygen. (Acute respiratory failure with hypoxia, anxiety)   Primary care provider is Dr. Redmond School with [facility], listed to provide transition of care.  Review of transition of careteam notes,showthat patient has been Oncologist for skilled rehab prior to admission and will return back there at discharge.   Pleaserefer to [place] are any disposition changesforfollow-upas appropriate.  Of note, [facility] Care Management services does not replace or interfere with any services that are arranged by transition of care case management or social work.  [facility] post acute RN coordinator will be made aware of patient's disposition for post discharge follow up of needs.    For questions and additional information, please call:  Magaret Justo A. Ishmail Mcmanamon, BSN, RN-BC [facility] Liaison Cell: [phone]

## 2019-01-26 NOTE — Discharge Summary (Signed)
Physician Discharge Summary  [name] JEH:[id number] DOB: 04/02/1951 DOA: 01/24/2019  PCP: Redmond School, MD  Admit date: 01/24/2019 Discharge date: 01/26/2019  Admitted From: [facility] Disposition: [facility]   Recommendations for Outpatient Follow-up:  1. Follow up with PCP in 1-2 weeks. Pt interested in establishing care elsewhere.  2. Continue treatment of anxiety disorder. Xanax 0.25mg  po TID prn #9 R:zero prescribed at discharge. 3. Please obtain BMP/CBC in one week 4. Please follow up on the following pending results: blood cultures drawn on admission. 1 of 4 growing GPCs rapid ID showing coagulase negative staphylococcus. Pt remains afebrile.   Home Health: Per SNF Equipment/Devices: Per SNF, 3L O2. Discharge Condition: Stable CODE STATUS: Full Diet recommendation: Heart healthy  Brief/Interim Summary: Joel Greene is a 68 y.o. male with a history of HTN, HLD, anxiety and recent admission for covid-19 pneumonia requiring intubation complicated by pneumococcal pneumonia and bacteremia and severe anxiety. He received Steroids, tocilizumab, convalescent plasma, and remdesivir, ultimately discharged with PICC line to complete 14 days of ceftriaxone at SNF, to which he was discharged with no supplemental oxygen requirement. He was discharged without supplemental oxygen or prn anxiety medication. He continued to have cough but was progressing with PT. He was given levaquin and supplemental oxygen for report of PNA on CXR, but was ultimately transported to the ED for evaluation. SpO2 documented as 99% on 3L O2, RR 22/min, afebrile. CXR showed improving infiltrates without focal consolidation, inflammatory markers had trended downward, ECG nonischemic and BNP normal, appearing euvolemic. He was given steroids and readmitted to [facility]. D-dimer was modestly elevated, so CTA chest was performed which revealed no PE and slightly improving bilateral consolidations. The patient's  symptoms have improved since anti-anxiety medication was restarted. Inflammatory markers have improved/some normalized. He is stable for discharge with supplemental oxygen.   Discharge Diagnoses:  Principal Problem:   Acute respiratory failure with hypoxia (HCC) Active Problems:   Anxiety   Hypokalemia   History of ETT  Acute hypoxic respiratory failure: Unclear precipitant of worsening, possibly anxiety/atelectasis. Recrudescence of covid not felt to be likely with improving CXR, normalizing inflammatory markers. No evidence of CHF, BNP 48.7. Procalcitonin undetectable, further suggesting no new pneumonia despite his history of pneumococcal pneumonia/bacteremia and the use of tocilizumab. ECG is unchanged without ischemic features and patient has no exertional chest pain. D-dimer is modestly elevated at admission yesterday at 0.70.  - CTA chest ruled out PE.  - Treat anxiety as below - Doubt there is a role for steroids, and this may worsen anxiety. - Mobilize as much as possible, prone as able.  History of Pneumococcal bacteremia, pneumonia: No complication on CT during last hospitalization, repeat blood cultures remained negative.  - Pull RUE PICC (placed 7/13) - Follow up blood cultures drawn at admission. Fever curve is reassuring, though 1 of 4 has grown likely CoNS felt to be a contaminant.  Anxiety: Severe, impacting current respiratory status.  - Restart on xanax 0.25mg  po TID prn. He had been on this for years and required precedex for a time during his last hospitalization. Would recommend continuing this low dose at discharge. We did have a discussion about polypharmacy and its risks in generally, and specifically for this patient. He understands that combining narcotic pain medications and benzodiazepines is not advised. No other new prescriptions were provided at discharge.  - Continue other home medications   Hypokalemia: Improved with supplementation  HLD:  - Continue  statin  GERD:  -  Continue PPI  Discharge Instructions Discharge Instructions    Diet - low sodium heart healthy   Complete by: As directed    Increase activity slowly   Complete by: As directed      Allergies as of 01/26/2019      Reactions   Demeclocycline Rash      Medication List    TAKE these medications   ALPRAZolam 0.25 MG tablet Commonly known as: XANAX Take 1 tablet (0.25 mg total) by mouth 3 (three) times daily as needed for anxiety.   aspirin EC 81 MG tablet Take 81 mg by mouth daily.   atorvastatin 20 MG tablet Commonly known as: LIPITOR Take 1 tablet by mouth daily.   busPIRone 10 MG tablet Commonly known as: BUSPAR Take 1 tablet by mouth 2 (two) times a day.   Combivent Respimat 20-100 MCG/ACT Aers respimat Generic drug: Ipratropium-Albuterol Inhale 2 puffs into the lungs every 6 (six) hours as needed for wheezing or shortness of breath.   cyclobenzaprine 10 MG tablet Commonly known as: FLEXERIL Take 1 tablet (10 mg total) by mouth 3 (three) times daily as needed for muscle spasms.   fluticasone 50 MCG/ACT nasal spray Commonly known as: FLONASE INHALE 2 SPRAYS IN EACH NOSTRIL ONCE DAILY.   gabapentin 300 MG capsule Commonly known as: NEURONTIN Take 300-600 mg by mouth 3 (three) times daily. Patient takes 1 capsule in the morning, 1 capsule in the evening, and 2 capsules at night   latanoprost 0.005 % ophthalmic solution Commonly known as: XALATAN Place 1 drop into both eyes at bedtime.   mupirocin ointment 2 % Commonly known as: BACTROBAN Place 1 application into the nose at bedtime. For dryness from CPAP   pantoprazole 40 MG tablet Commonly known as: PROTONIX TAKE (1) TABLET BY MOUTH TWICE DAILY BEFORE MEALS (BREAKFAST AND SUPPER).   PROBIOTIC PO Take 1 tablet by mouth daily.   SYSTANE OP Place 1 drop into both eyes daily as needed (seasonal allergies/ dry eyes).   tamsulosin 0.4 MG Caps capsule Commonly known as: FLOMAX Take 0.4  mg by mouth daily after breakfast.   venlafaxine XR 75 MG 24 hr capsule Commonly known as: EFFEXOR-XR TAKE 1 CAPSULE ONCE A DAY WITH BREAKFAST.   Vitamin D3 125 MCG (5000 UT) Tabs Take 1 capsule by mouth daily.   zolpidem 10 MG tablet Commonly known as: AMBIEN Take 10 mg by mouth at bedtime as needed for sleep. What changed: Another medication with the same name was removed. Continue taking this medication, and follow the directions you see here.      Follow-up Information    Redmond School, MD Follow up.   Specialty: Internal Medicine Contact information: [address] [place] [address] [phone]          Allergies  Allergen Reactions  . Demeclocycline Rash    Consultations:  None  Procedures/Studies: Ct Chest Wo Contrast  Result Date: 01/05/2019 CLINICAL DATA:  Hospitalization for COVID-19/respiratory failure, hospitalization complicated by superimposed Streptococcus pneumonia and bacteremia EXAM: CT CHEST WITHOUT CONTRAST TECHNIQUE: Multidetector CT imaging of the chest was performed following the standard protocol without IV contrast. COMPARISON:  Numerous prior chest radiographs, most recently December 18, 2018 FINDINGS: Cardiovascular: Coronary calcifications and calcification of the aortic valve leaflets. Cardiac size is normal. Mild central pulmonary arterial enlargement. Mediastinum/Nodes: Few reactive appearing low-attenuation mediastinal and hilar nodes. Thyroid gland, trachea and esophagus demonstrate no significant abnormality. Lungs/Pleura: Diffuse airways thickening and scattered secretions. There are scattered peripheral areas of  ground-glass opacity throughout both lungs. More focal consolidation is present in the right middle and lower lobe and posterior basal segment of the left lower lobe. Bronchiectatic changes are present of the right lower lobe airways. Superimposed areas of volume loss in both lung bases. Upper Abdomen: Mild nonspecific  stranding seen along the superior aspect of the right kidney. Musculoskeletal: Flowing anterior vertebral body osteophytosis compatible with diffuse idiopathic skeletal hyperostosis. No acute osseous abnormality or suspicious osseous lesion. 01 soft tissues of the chest wall are unremarkable. IMPRESSION: Findings compatible with patient's history of viral pneumonia with bacterial superinfection. Airspace disease most pronounced in the right middle and lower lobes. Likely developing architectural distortion and scarring with bronchiectatic changes the right lower lobe airways. Reactive mediastinal and hilar adenopathy. Central pulmonary arterial enlargement may reflect pulmonary arterial hypertension. Osseous features compatible with diffuse idiopathic skeletal hyperostosis. Mild nonspecific right perinephric stranding, incompletely assessed on this exam. Correlate with renal function. Aortic Atherosclerosis (ICD10-I70.0). Electronically Signed   By: MD Lovena Le   On: 01/05/2019 18:16   Ct Angio Chest Pe W Or Wo Contrast  Result Date: 01/25/2019 CLINICAL DATA:  Decreased oxygen saturation EXAM: CT ANGIOGRAPHY CHEST WITH CONTRAST TECHNIQUE: Multidetector CT imaging of the chest was performed using the standard protocol during bolus administration of intravenous contrast. Multiplanar CT image reconstructions and MIPs were obtained to evaluate the vascular anatomy. CONTRAST:  172mL OMNIPAQUE IOHEXOL 350 MG/ML SOLN COMPARISON:  January 05, 2019 FINDINGS: Cardiovascular: There is no demonstrable pulmonary embolus. There is no thoracic aortic aneurysm or dissection. There are scattered foci of calcification in visualized great vessels. Note that the right innominate and left common carotid arteries arise as a common trunk, an anatomic variant. There is aortic atherosclerosis. There are foci of coronary artery calcification. There is no pericardial effusion or pericardial thickening evident. Mediastinum/Nodes: Thyroid  appears unremarkable. There are scattered subcentimeter mediastinal lymph nodes. No adenopathy by size criteria evident. No esophageal lesions are appreciable. Lungs/Pleura: There is a degree of underlying fibrosis in the lung bases with patchy airspace opacity throughout both left lower lobe regions. There is patchy ground-glass type opacity in portions of the right middle and right upper lobe with partial clearing of consolidation from the right upper lobe compared to most recent CT. Patchy areas of airspace opacity in the left upper lobe appear essentially stable. There is also mild ground-glass type opacity in the lingula. No new consolidation is evident in the lungs compared to most recent study. There is minimal pleural thickening in the base regions bilaterally, stable. Upper Abdomen: Stomach is moderately filled with fluid and food material. Visualized upper abdominal structures otherwise appear unremarkable. Gallbladder is mildly contracted. Musculoskeletal: There is degenerative change in the thoracic spine with a degree of diffuse idiopathic skeletal hyperostosis. No blastic or lytic bone lesions. No evident chest wall lesions. Review of the MIP images confirms the above findings. IMPRESSION: 1. No demonstrable pulmonary embolus. No thoracic aortic aneurysm or dissection. There are foci of aortic atherosclerosis and coronary artery calcification. 2. Multifocal airspace opacity with overall slightly less consolidation compared to the most recent study. No new areas of opacity evident. Suspect partial resolution of multifocal pneumonia, some of which may be of atypical organism etiology. There is felt to be a degree of bibasilar underlying fibrosis. 3.  No evident adenopathy by size criteria. Aortic Atherosclerosis (ICD10-I70.0). Electronically Signed   By: Lowella Grip III M.D.   On: 01/25/2019 15:02   Dg Chest [facility] 1 View  Result  Date: 01/24/2019 CLINICAL DATA:  Positive covid, pneumonia EXAM:  PORTABLE CHEST 1 VIEW COMPARISON:  January 05, 2019 CT, radiograph January 01, 2019 FINDINGS: There is a right-sided PICC catheter with the tip seen overlying the mid SVC. Patchy airspace opacities are seen throughout both lungs slightly improved since the prior exam. Again noted is mild cardiomegaly. No pleural effusion are seen. No acute osseous injury. IMPRESSION: 1. Slight interval improvement in the bilateral airspace opacities. 2. Mild cardiomegaly Electronically Signed   By: Prudencio Pair M.D.   On: 01/24/2019 16:51   Dg Chest Port 1 View  Result Date: 01/02/2019 CLINICAL DATA:  Hypoxia EXAM: PORTABLE CHEST 1 VIEW COMPARISON:  12/27/2018, 12/25/2018, 12/19/2018, 12/18/2018 FINDINGS: Right upper extremity catheter tip over the cavoatrial region. Low lung volumes. Increased airspace disease in the right mid to upper lung. Bilateral pulmonary airspace opacities are otherwise unchanged. Stable enlarged cardiomediastinal silhouette. No pneumothorax. IMPRESSION: 1. Low lung volumes 2. Interval worsening of airspace disease in the right mid to upper lung since the most recent prior. Bilateral pulmonary infiltrates are otherwise unchanged. 3. Enlarged cardiomediastinal silhouette Electronically Signed   By: Donavan Foil M.D.   On: 01/02/2019 00:30   [place] Ekg Site Rite  Result Date: 01/09/2019 If Site Rite image not attached, placement could not be confirmed due to current cardiac rhythm.    Subjective: Shortness of breath is stable. No new chest pain, leg swelling, or fever. Wants to get back to [facility] to preserve his private room.  Discharge Exam: Vitals:   01/26/19 0548 01/26/19 0752  BP: (!) 139/96   Pulse:    Resp:    Temp: 97.9 F (36.6 C) 98 F (36.7 C)  SpO2:     General: Pt is alert, awake, not in acute distress Cardiovascular: Regular tachycardia, S1/S2 +, no rubs, no gallops Respiratory: Nonlabored, crackles bilaterally Abdominal: Soft, NT, ND, bowel sounds + Extremities: No edema, no  cyanosis  Labs: BNP (last 3 results) Recent Labs    12/31/18 0500 01/01/19 0200 01/24/19 2040  BNP 54.7 103.4* 64.3   Basic Metabolic Panel: Recent Labs  Lab 01/24/19 1604 01/25/19 0640 01/26/19 0220  NA 142 143 142  K 2.9* 3.6 3.6  CL 106 107 106  CO2 25 25 26   GLUCOSE 104* 98 100*  BUN 6* 6* 12  CREATININE 0.65 0.62 0.66  CALCIUM 8.8* 8.4* 8.8*  MG  --  1.8  --   PHOS  --  5.1*  --    Liver Function Tests: Recent Labs  Lab 01/24/19 1604 01/25/19 0640 01/26/19 0220  AST 33 38 27  ALT 58* 60* 56*  ALKPHOS 65 59 70  BILITOT 0.4 0.5 0.5  PROT 6.2* 5.7* 5.9*  ALBUMIN 3.4* 3.1* 3.3*   No results for input(s): LIPASE, AMYLASE in the last 168 hours. No results for input(s): AMMONIA in the last 168 hours. CBC: Recent Labs  Lab 01/24/19 1604 01/25/19 0640 01/26/19 0220  WBC 6.4 4.0 4.1  NEUTROABS 4.9 2.5 2.4  HGB 11.6* 11.6* 11.6*  HCT 36.1* 35.1* 36.7*  MCV 99.2 98.9 100.3*  PLT 256 258 277   Cardiac Enzymes: Recent Labs  Lab 01/25/19 0640  CKTOTAL 15*   BNP: Invalid input(s): POCBNP CBG: Recent Labs  Lab 01/25/19 0753  GLUCAP 100*   D-Dimer Recent Labs    01/25/19 0640 01/26/19 0220  DDIMER 0.47 0.66*   Hgb A1c No results for input(s): HGBA1C in the last 72 hours. Lipid Profile Recent Labs  01/24/19 1604  TRIG 76   Thyroid function studies No results for input(s): TSH, T4TOTAL, T3FREE, THYROIDAB in the last 72 hours.  Invalid input(s): FREET3 Anemia work up Recent Labs    01/25/19 0640 01/26/19 0220  FERRITIN 392* 384*   Urinalysis    Component Value Date/Time   COLORURINE YELLOW 12/18/2018 1527   APPEARANCEUR HAZY (A) 12/18/2018 1527   LABSPEC 1.019 12/18/2018 1527   PHURINE 6.0 12/18/2018 1527   GLUCOSEU NEGATIVE 12/18/2018 1527   HGBUR NEGATIVE 12/18/2018 1527   [place] 12/18/2018 1527   KETONESUR NEGATIVE 12/18/2018 1527   PROTEINUR 30 (A) 12/18/2018 1527   UROBILINOGEN 0.2 02/06/2015 1353    NITRITE NEGATIVE 12/18/2018 1527   [place] 12/18/2018 1527    Microbiology Recent Results (from the past 240 hour(s))  SARS Coronavirus 2 [facility] order, Performed in [facility] hospital lab)     Status: Abnormal   Collection Time: 01/24/19  4:04 PM   Specimen: Nasopharyngeal Swab  Result Value Ref Range Status   SARS Coronavirus 2 POSITIVE (A) NEGATIVE Final    Comment: RESULT CALLED TO, READ BACK BY AND VERIFIED WITH: Theresia Lo [id number] @ [facility] (NOTE) If result is NEGATIVE SARS-CoV-2 target nucleic acids are NOT DETECTED. The SARS-CoV-2 RNA is generally detectable in upper and lower  respiratory specimens during the acute phase of infection. The lowest  concentration of SARS-CoV-2 viral copies this assay can detect is 250  copies / mL. A negative result does not preclude SARS-CoV-2 infection  and should not be used as the sole basis for treatment or other  patient management decisions.  A negative result may occur with  improper specimen collection / handling, submission of specimen other  than nasopharyngeal swab, presence of viral mutation(s) within the  areas targeted by this assay, and inadequate number of viral copies  (<250 copies / mL). A negative result must be combined with clinical  observations, patient history, and epidemiological information. If result is POSITIVE SARS-CoV-2 target nucleic acids are DETEC TED. The SARS-CoV-2 RNA is generally detectable in upper and lower  respiratory specimens during the acute phase of infection.  Positive  results are indicative of active infection with SARS-CoV-2.  Clinical  correlation with patient history and other diagnostic information is  necessary to determine patient infection status.  Positive results do  not rule out bacterial infection or co-infection with other viruses. If result is PRESUMPTIVE POSTIVE SARS-CoV-2 nucleic acids MAY BE PRESENT.   A presumptive positive result was obtained  on the submitted specimen  and confirmed on repeat testing.  While 2019 novel coronavirus  (SARS-CoV-2) nucleic acids may be present in the submitted sample  additional confirmatory testing may be necessary for epidemiological  and / or clinical management purposes  to differentiate between  SARS-CoV-2 and other Sarbecovirus currently known to infect humans.  If clinically indicated additional testing with an alternate test  methodology (LAB7 [id number]) is advised. The SARS-CoV-2 RNA is generally  detectable in upper and lower respiratory specimens during the acute  phase of infection. The expected result is Negative. Fact Sheet for Patients:  StrictlyIdeas.no Fact Sheet for Healthcare Providers: BankingDealers.co.za This test is not yet approved or cleared by the [place] FDA and has been authorized for detection and/or diagnosis of SARS-CoV-2 by FDA under an Emergency Use Authorization (EUA).  This EUA will remain in effect (meaning this test can be used) for the duration of the COVID-19 declaration under Section 564(b)(1) of the Act,  21 U.S.C. section 360bbb-3(b)(1), unless the authorization is terminated or revoked sooner. Performed at [facility], [place] [address], [place], [place] [address]   Blood Culture (routine x 2)     Status: None (Preliminary result)   Collection Time: 01/24/19  4:04 PM   Specimen: BLOOD LEFT HAND  Result Value Ref Range Status   Specimen Description   Final    BLOOD LEFT HAND Performed at [facility] [address], [place], [place] [address]    Special Requests   Final    BOTTLES DRAWN AEROBIC AND ANAEROBIC Blood Culture adequate volume Performed at [facility] [address], [place], [place] [address]    Culture   Final    NO GROWTH 2 DAYS Performed at [facility] [address], [place], [place] [address]    Report Status PENDING   Incomplete  Blood Culture (routine x 2)     Status: None (Preliminary result)   Collection Time: 01/24/19  4:09 PM   Specimen: BLOOD  Result Value Ref Range Status   Specimen Description   Final    BLOOD RIGHT ANTECUBITAL Performed at [facility], [place] [address] [place], [place] [address]    Special Requests   Final    BOTTLES DRAWN AEROBIC AND ANAEROBIC Blood Culture adequate volume Performed at [facility] [address] [place], [place] [address]    Culture  Setup Time   Final    AEROBIC BOTTLE ONLY GRAM POSITIVE COCCI Organism ID to follow CRITICAL RESULT CALLED TO, READ BACK BY AND VERIFIED WITHGuadlupe [facility] 01/26/19 0056 JDW Performed at [facility], [address] [place], [place] [address]    Culture GRAM POSITIVE COCCI  Final   Report Status PENDING  Incomplete  Blood Culture ID Panel (Reflexed)     Status: Abnormal   Collection Time: 01/24/19  4:09 PM  Result Value Ref Range Status   Enterococcus species NOT DETECTED NOT DETECTED Final   Listeria monocytogenes NOT DETECTED NOT DETECTED Final   Staphylococcus species DETECTED (A) NOT DETECTED Final    Comment: Methicillin (oxacillin) resistant coagulase negative staphylococcus. Possible blood culture contaminant (unless isolated from more than one blood culture draw or clinical case suggests pathogenicity). No antibiotic treatment is indicated for blood  culture contaminants. CRITICAL RESULT CALLED TO, READ BACK BY AND VERIFIED WITH: N GLOGOVAC PHARMD 01/26/19 0056 JDW    Staphylococcus aureus (BCID) NOT DETECTED NOT DETECTED Final   Methicillin resistance DETECTED (A) NOT DETECTED Final    Comment: CRITICAL RESULT CALLED TO, READ BACK BY AND VERIFIED WITH: N GLOGOVAC PHARMD 01/26/19 0056 JDW    Streptococcus species NOT DETECTED NOT DETECTED Final   Streptococcus agalactiae NOT DETECTED NOT DETECTED Final   Streptococcus pneumoniae NOT DETECTED NOT DETECTED Final    Streptococcus pyogenes NOT DETECTED NOT DETECTED Final   Acinetobacter baumannii NOT DETECTED NOT DETECTED Final   Enterobacteriaceae species NOT DETECTED NOT DETECTED Final   Enterobacter cloacae complex NOT DETECTED NOT DETECTED Final   Escherichia coli NOT DETECTED NOT DETECTED Final   Klebsiella oxytoca NOT DETECTED NOT DETECTED Final   Klebsiella pneumoniae NOT DETECTED NOT DETECTED Final   Proteus species NOT DETECTED NOT DETECTED Final   Serratia marcescens NOT DETECTED NOT DETECTED Final   Haemophilus influenzae NOT DETECTED NOT DETECTED Final   Neisseria meningitidis NOT DETECTED NOT DETECTED Final   Pseudomonas aeruginosa NOT DETECTED NOT DETECTED Final  Candida albicans NOT DETECTED NOT DETECTED Final   Candida glabrata NOT DETECTED NOT DETECTED Final   Candida krusei NOT DETECTED NOT DETECTED Final   Candida parapsilosis NOT DETECTED NOT DETECTED Final   Candida tropicalis NOT DETECTED NOT DETECTED Final    Comment: Performed at [facility], [place] [address], [place], [place] [address]    Time coordinating discharge: Approximately 40 minutes  Patrecia Pour, MD  [facility] 01/26/2019, 8:56 AM

## 2019-01-27 ENCOUNTER — Other Ambulatory Visit: Payer: Self-pay | Admitting: *Deleted

## 2019-01-27 LAB — CULTURE, BLOOD (ROUTINE X 2): Special Requests: ADEQUATE

## 2019-01-27 NOTE — Patient Outreach (Signed)
Member assessed for potential [facility] Care Management needs as a benefit of [place] Medicare.  Made aware by [facility] Liaison that Mr. Beedle returned to [facility] on 01/26/19.   Writer to follow for disposition plans, progression, and for potential [facility] Care Management needs will at [facility].  Will collaborate with [facility] UM team and facility on member while at SNF.    Marthenia Rolling, MSN-Ed, RN,BSN [place] Acute Care Coordinator [phone] [facility]) [phone]  (Toll free office)

## 2019-01-29 LAB — CULTURE, BLOOD (ROUTINE X 2)
Culture: NO GROWTH
Special Requests: ADEQUATE

## 2019-02-03 ENCOUNTER — Other Ambulatory Visit: Payer: Self-pay | Admitting: *Deleted

## 2019-02-03 NOTE — Patient Outreach (Signed)
Member assessed for potential [facility] Care Management needs as a benefit of  [place] Medicare.  Member is currently receiving rehab therapy at [facility].  Member discussed in weekly telephonic IDT meeting with facility staff, [facility] UM team, and writer.  Facility reports Joel Greene disposition plan is for home. States he has a supportive brother who lives 10 mins away. Facility reports member has 3 steps to enter his home but does not have rails. He is currently in isolation due to covid. Facility reports discharge care plan meeting will be scheduled when member is clinically stable.   Writer will outreach to Joel Greene closer to SNF discharge to discuss [facility] Management follow up.   Marthenia Rolling, MSN-Ed, RN,BSN [place] Acute Care Coordinator [phone] [facility]) [phone]  (Toll free office)

## 2019-02-10 DIAGNOSIS — U071 COVID-19: Secondary | ICD-10-CM | POA: Diagnosis not present

## 2019-02-10 DIAGNOSIS — J1289 Other viral pneumonia: Secondary | ICD-10-CM | POA: Diagnosis not present

## 2019-02-10 DIAGNOSIS — E7849 Other hyperlipidemia: Secondary | ICD-10-CM | POA: Diagnosis not present

## 2019-02-10 DIAGNOSIS — E119 Type 2 diabetes mellitus without complications: Secondary | ICD-10-CM | POA: Diagnosis not present

## 2019-02-10 DIAGNOSIS — F3489 Other specified persistent mood disorders: Secondary | ICD-10-CM | POA: Diagnosis not present

## 2019-02-10 DIAGNOSIS — I5032 Chronic diastolic (congestive) heart failure: Secondary | ICD-10-CM | POA: Diagnosis not present

## 2019-02-10 DIAGNOSIS — I10 Essential (primary) hypertension: Secondary | ICD-10-CM | POA: Diagnosis not present

## 2019-02-10 DIAGNOSIS — F418 Other specified anxiety disorders: Secondary | ICD-10-CM | POA: Diagnosis not present

## 2019-02-14 DIAGNOSIS — F3489 Other specified persistent mood disorders: Secondary | ICD-10-CM | POA: Diagnosis not present

## 2019-02-14 DIAGNOSIS — G478 Other sleep disorders: Secondary | ICD-10-CM | POA: Diagnosis not present

## 2019-02-17 ENCOUNTER — Other Ambulatory Visit: Payer: Self-pay | Admitting: *Deleted

## 2019-02-17 DIAGNOSIS — I10 Essential (primary) hypertension: Secondary | ICD-10-CM

## 2019-02-17 NOTE — Patient Outreach (Signed)
Member assessed for potential [facility] Care Management needs as a benefit of  [place] Medicare.  Member is currently receiving rehab therapy at [facility].  Member discussed in weekly telephonic IDT meeting with  facility staff, [facility] UM team, and writer. Mr. Vandervelden is slated for discharge to home with home health on tomorrow 02/18/19  Telephone call made at [phone] to Mr. Freid after telephonic IDT meeting with facility. Patient identifiers confirmed.   Mr. Halfmann endorses that he will return home tomorrow. He states he will return home alone. States he has supportive neighbors and a brother Audry Pili) that lives 8 miles away. Mr. Mcbroom states his brother is not reliable however. States his HCPOAs are his girlfriend Kalman Shan and his daughter Philis Nettle. However, neither live locally. States his neighbors are very supportive and have already contacted him and will check on him regularly.   Mr. Dohrmann reports he has contacted [employer] to come sanitize and disinfect his whole house. He denies the need for a life alert system. States " I have 3 phones and have emergency number programed in my phones."  Mr. Geise states he has 2.5 steps to enter in his home but states he can grab the know to step up. He states he is doing so much better and is grateful for his progress and recovery. Reports he ordered an breathing apparatus that helps him take in deeper breaths. States "it's way better than the incentive spirometer." States the therapist at the facility recommended it. He states he has a nebulizer at home but will need an albuterol neb prescription. States he plans on asking Dr. Nolon Rod office when he gets home. States he will need a wheelchair and likely rolling walker.  Discussed that writer will follow up with dc planner at facility regarding DME and nebulizer medication prescription requests.   Explained that [place] Management will not interfere or replace services provided by home  health.  Discussed [facility] Social Worker referral for meals since he lives alone. Referral for [facility] Pharmacist for medication review since he has been in the hospital and SNF since June 2020. Referral for [facility] for complex case management.  Mr. Kiedrowski expressed appreciation of writer's call and assistance [facility] Care Management can provide.   Will make referrals to [place] since member is discharging home tomorrow.   Mr. Platts has history of COVID- 85, HTN, HLD, anxiety, GERD.  Notification and voicemail message left for facility discharge planner to make aware of DME and albuterol nebulizer med request.     Marthenia Rolling, MSN-Ed, RN,BSN [place] Acute Care Coordinator [phone] [facility]) [phone]  (Toll free office)

## 2019-02-21 ENCOUNTER — Other Ambulatory Visit: Payer: Self-pay

## 2019-02-21 NOTE — Progress Notes (Signed)
This encounter was created in error - please disregard.

## 2019-02-21 NOTE — Patient Outreach (Signed)
[facility] [facility]) Care Management  02/21/2019  Joel Greene [birth year] [id number]    Referral received from [place] for complex case management. Per referral, patient was scheduled to discharge from [facility] facility on 02/18/19.  Initial outreach unsuccessful. Phone rang multiple times without option to leave a voice message. Will mail unsuccessful outreach letter and follow-up within 3-4 business days.  [place] Care Management [phone]

## 2019-02-22 ENCOUNTER — Other Ambulatory Visit: Payer: Self-pay

## 2019-02-22 NOTE — Patient Outreach (Signed)
[place] [facility]) Care Management  02/22/2019  DANN BUENAFE 1951/01/23 [id number]   Unsuccessful outreach to patient today regarding social work referral for meal delivery. Left voicemail message.  Unsuccessful outreach letter mailed by Bea Graff, on 02/22/19.  Will attempt to reach again within four business days.  Ronn Melena, BSW Social Worker [phone]

## 2019-02-24 ENCOUNTER — Other Ambulatory Visit: Payer: Self-pay

## 2019-02-24 ENCOUNTER — Ambulatory Visit: Payer: Self-pay

## 2019-02-24 NOTE — Patient Outreach (Signed)
[place]) Care Management  02/24/2019  Joel Greene [birth year] [id number]   Second unsuccessful outreach to patient today regarding social work referral for meal delivery. Left voicemail message.  Unsuccessful outreach letter mailed by Bea Graff, on 02/22/19.  Will attempt to reach again within four business days.  Ronn Melena, BSW Social Worker [phone]

## 2019-02-25 ENCOUNTER — Other Ambulatory Visit: Payer: Self-pay

## 2019-02-25 NOTE — Patient Outreach (Signed)
[place] [facility] [facility]) Care Management  02/25/2019  Joel Greene 04/23/1951 [id number]   2nd unsuccessful outreach attempt. Will follow-up within 3-4 business days.   [place] Care Management [phone]

## 2019-02-28 ENCOUNTER — Other Ambulatory Visit: Payer: Self-pay

## 2019-02-28 ENCOUNTER — Other Ambulatory Visit: Payer: Self-pay | Admitting: Pharmacist

## 2019-02-28 ENCOUNTER — Ambulatory Visit: Payer: Self-pay

## 2019-02-28 NOTE — Patient Outreach (Signed)
[place] [facility]) Care Management  02/28/2019  Joel Greene 07-30-1950 [id number]   Third unsuccessful outreach to patient today regarding social work referral for meal delivery. Left voicemail message. Unsuccessful outreach letter mailed by Bea Graff, on 02/22/19. Will close case if no return call by 03/07/19.    Ronn Melena, BSW Social Worker [phone]

## 2019-02-28 NOTE — Patient Outreach (Signed)
[place] [facility]) Care Management Joel Greene  02/28/2019  [name] Jul 30, 1950 [id number]  Reason for referral: medication assistance/review  [facility] pharmacy case is being closed due to the following reasons:  Patient case handed off to contracted PharmD Astrid Drafts [phone]) with [facility] on 02/28/19 at 9:16am.  PharmD verbalized understanding.  All pharmacy needs to be addressed by the above PharmD.  No further follow up required by [place].   Regina Eck, PharmD, [place]  [phone]

## 2019-03-03 ENCOUNTER — Other Ambulatory Visit: Payer: Self-pay

## 2019-03-03 NOTE — Patient Outreach (Signed)
[place] [facility]) Care Management  03/03/2019  SHERYL JUNG [birth year] [id number]     3rd unsuccessful outreach attempt. Left voice message requesting a return call.    PLAN -Case closure pending.   [place] Care Management [phone]

## 2019-03-04 ENCOUNTER — Other Ambulatory Visit: Payer: Self-pay

## 2019-03-08 ENCOUNTER — Other Ambulatory Visit: Payer: Self-pay

## 2019-03-08 NOTE — Patient Outreach (Signed)
[place] [facility]) Care Management  03/08/2019  Joel Greene 1950-08-03 [id number]   [facility] Social Work case closure due to inability to contact.  Ronn Melena, BSW Social Worker [phone]

## 2019-03-09 ENCOUNTER — Other Ambulatory Visit: Payer: Self-pay | Admitting: *Deleted

## 2019-03-09 DIAGNOSIS — R6889 Other general symptoms and signs: Secondary | ICD-10-CM | POA: Diagnosis not present

## 2019-03-09 DIAGNOSIS — Z20822 Contact with and (suspected) exposure to covid-19: Secondary | ICD-10-CM

## 2019-03-11 LAB — NOVEL CORONAVIRUS, NAA: SARS-CoV-2, NAA: NOT DETECTED

## 2019-03-15 ENCOUNTER — Other Ambulatory Visit: Payer: Self-pay | Admitting: Neurology

## 2019-03-24 DIAGNOSIS — Z1389 Encounter for screening for other disorder: Secondary | ICD-10-CM | POA: Diagnosis not present

## 2019-03-24 DIAGNOSIS — K589 Irritable bowel syndrome without diarrhea: Secondary | ICD-10-CM | POA: Diagnosis not present

## 2019-03-24 DIAGNOSIS — E663 Overweight: Secondary | ICD-10-CM | POA: Diagnosis not present

## 2019-03-24 DIAGNOSIS — G894 Chronic pain syndrome: Secondary | ICD-10-CM | POA: Diagnosis not present

## 2019-03-24 DIAGNOSIS — F419 Anxiety disorder, unspecified: Secondary | ICD-10-CM | POA: Diagnosis not present

## 2019-03-24 DIAGNOSIS — M1991 Primary osteoarthritis, unspecified site: Secondary | ICD-10-CM | POA: Diagnosis not present

## 2019-03-24 DIAGNOSIS — I10 Essential (primary) hypertension: Secondary | ICD-10-CM | POA: Diagnosis not present

## 2019-03-24 DIAGNOSIS — Z0000 Encounter for general adult medical examination without abnormal findings: Secondary | ICD-10-CM | POA: Diagnosis not present

## 2019-03-24 DIAGNOSIS — J309 Allergic rhinitis, unspecified: Secondary | ICD-10-CM | POA: Diagnosis not present

## 2019-03-24 DIAGNOSIS — R6882 Decreased libido: Secondary | ICD-10-CM | POA: Diagnosis not present

## 2019-03-24 DIAGNOSIS — Z6826 Body mass index (BMI) 26.0-26.9, adult: Secondary | ICD-10-CM | POA: Diagnosis not present

## 2019-03-24 DIAGNOSIS — R5383 Other fatigue: Secondary | ICD-10-CM | POA: Diagnosis not present

## 2019-04-05 DIAGNOSIS — E663 Overweight: Secondary | ICD-10-CM | POA: Diagnosis not present

## 2019-04-05 DIAGNOSIS — K529 Noninfective gastroenteritis and colitis, unspecified: Secondary | ICD-10-CM | POA: Diagnosis not present

## 2019-04-05 DIAGNOSIS — Z6826 Body mass index (BMI) 26.0-26.9, adult: Secondary | ICD-10-CM | POA: Diagnosis not present

## 2019-04-05 DIAGNOSIS — Z0001 Encounter for general adult medical examination with abnormal findings: Secondary | ICD-10-CM | POA: Diagnosis not present

## 2019-04-05 DIAGNOSIS — Z1389 Encounter for screening for other disorder: Secondary | ICD-10-CM | POA: Diagnosis not present

## 2019-04-27 ENCOUNTER — Encounter: Payer: Self-pay | Admitting: Neurology

## 2019-05-02 ENCOUNTER — Encounter: Payer: Self-pay | Admitting: Neurology

## 2019-05-02 ENCOUNTER — Other Ambulatory Visit: Payer: Self-pay

## 2019-05-02 ENCOUNTER — Ambulatory Visit (INDEPENDENT_AMBULATORY_CARE_PROVIDER_SITE_OTHER): Payer: Medicare Other | Admitting: Neurology

## 2019-05-02 VITALS — BP 189/98 | HR 67 | Temp 98.2°F | Ht 71.0 in | Wt 187.0 lb

## 2019-05-02 DIAGNOSIS — G4733 Obstructive sleep apnea (adult) (pediatric): Secondary | ICD-10-CM | POA: Diagnosis not present

## 2019-05-02 DIAGNOSIS — Z9989 Dependence on other enabling machines and devices: Secondary | ICD-10-CM

## 2019-05-02 DIAGNOSIS — Z8619 Personal history of other infectious and parasitic diseases: Secondary | ICD-10-CM

## 2019-05-02 DIAGNOSIS — Z8616 Personal history of COVID-19: Secondary | ICD-10-CM

## 2019-05-02 NOTE — Progress Notes (Signed)
Order for new cpap sent to [facility] via community message. Confirmation received that the order transmitted was successful.    

## 2019-05-02 NOTE — Progress Notes (Signed)
Subjective:    Patient ID: Joel Greene is a 68 y.o. male.  HPI     Interim history:   Joel Greene is a 68 year old right-handed gentleman with an underlying medical history of anxiety, recurrent UTIs, remote Hx of smoking, mild obesity, and nephrolithiasis, who presents for followup consultation of his obstructive sleep apnea, treated with CPAP. He is unaccompanied today.  I last saw him on 04/27/2018, at which time he reported that his CPAP machine was not working.  He did eventually get some help from a [facility] in [place].  He was using Flonase as needed.  He continued to be on Ambien 10 mg generic per PCP.    In the interim, in April 2020 he requested a refill on the Ambien since his primary care physician's office was closed due to the virus pandemic.  I provided a one-time refill on the Ambien.    Unfortunately, he developed COVID-19 related problems including pneumonia.  He was hospitalized in June for fever and shortness of breath, admitted from 12/18/2018 through 01/13/2019.  He was readmitted for hypoxia on 01/24/2019 and discharged on 01/26/2019, he was admitted from [facility] and discharged back to his skilled nursing facility for rehab.   Today, 05/02/2019: I reviewed his CPAP compliance data for the past 30 days from 03/29/2019 through 04/27/2019, during which time he used his machine 28 days with percent use days greater than 4 hours at 63%, indicating mildly suboptimal compliance, average usage of 6 hours and 9 minutes, residual AHI mildly elevated at 8.4, leak on the higher end with a 95th percentile at 16.2 L/min on a pressure of 8 cm.  In the past 90 days his compliance was less because of larger gaps in treatment. He reports that he did not have a CPAP machine while in rehab.  He reports feeling better.  He was started on Xanax while in the hospital.  He is requesting a refill on the Xanax.  He is advised to talk to his primary care physician about Xanax prescription  especially since it would be considered high risk in combination with narcotic pain medication.  He is also requesting a refill on the Ambien.  He has been receiving his Ambien from his primary care physician and is advised that only covered it 1 time as requested as his PCP office was closed in April due to COVID-19 pandemic.He does have some residual anxiety. His CPAP machine is older, set up date was 09/06/2013, he reports that the machine is not always working properly, sometimes it shuts off in the middle of the night and after waiting an hour he can restart it.  He has not been in touch with his DME company regarding this.  He may be eligible for a new machine, as far as I can tell. He reports that he recently passed a kidney stone.  He is going to make an appointment with urology.   The patient's allergies, current medications, family history, past medical history, past social history, past surgical history and problem list were reviewed and updated as appropriate.    Previously (copied from previous notes for reference):      I saw him on 04/23/2017, at which time he was compliant with CPAP. He was supposed to switch from Ambien CR to regular generic Ambien due to an insurance change. He was endorsing some stress and anxiety. He had been on Effexor long-acting. He had a prescription for gabapentin and Flexeril through his spine surgeon.  I increased his Effexor long-acting to 75 mg once daily.   I reviewed his CPAP compliance data from 03/27/2018 through 04/25/2018 which is a total of 30 days, during which time he used his CPAP only 14 days with percent used days greater than 4 hours at 40%, indicating suboptimal compliance with an average usage of 6 hours and 54 minutes, residual AHI 3.4 per hour, leak acceptable with the 95th percentile at 12.7 L/m on a pressure of 8 cm. In the past 90 days his CPAP usage was 26 out of 90 days with percent used days greater than 4 hours at only 24%.    I saw him  on 04/23/2016, at which time he reported doing well. He had started Effexor which had helped. Stress level was better. He was still taking Ambien CR as needed, up to 4 times a week. He had recently forgotten to take his CPAP machine on a trip but otherwise was compliant with treatment. He had a recent workup for chest pain, negative for heart attack. I suggested a one-year follow-up appointment for sleep apnea.   I reviewed his CPAP compliance data from 03/23/2017 through 04/21/2017 which is a total of 30 days, during which time he used his CPAP 29 days with percent used days greater than 4 hours at 87%, indicating very good compliance with an average usage of 6 hours and 50 minutes, residual AHI 2.9 per hour, leak low with the 95th percentile at 4.6 L/m on a pressure of 8 cm.    I saw him on 10/17/2015, at which time he reported ongoing issues with sleep maintenance. He was compliant with CPAP therapy. [name] did not help and was also no longer covered by his insurance. He was on generic Ambien per primary care physician and had tried Ambien CR in the past. I did not recommend trazodone or temazepam or Ambien. He requested to try Ambien CR again and I prescribed this in generic form. He was having some issues with depression. He had had side effects with Lexapro in the past including insomnia. I suggested low-dose long-acting Effexor at 37.5 mg daily.    I reviewed his CPAP compliance data from 03/23/2016 through 04/21/2016 which is a total of 30 days, during which time he used his CPAP only 19 days with percent used days greater than 4 hours at 60% only, indicating suboptimal compliance with an average usage of 5 hours and 15 minutes, residual AHI 2.7 per hour, leaked low with the 95th percentile at 4.7 L/m on a pressure of 8 cm with EPR of 1.    I saw him on 10/10/2014, at which time he reported that he recently had rotator cuff surgery on the right, December 2015. He also saw Dr. Glenna Fellows for cervical  spine disease. He was supposed to see Dr. Francesco Runner for neck injections. He was also referred to a hand surgeon for right hand Dupuytren's contracture. As far as his sleep is concerned he was compliant with treatment with CPAP and continued to endorse good results in fact felt he could not sleep without it. For sleep maintenance and sleep initiation issues we had tried sonata and he requested a refill. He was using it about 4 times a week. He had to change his DME company after his insurance changed after he turned 21. He had some neck pain with radiation to the right.    I reviewed his CPAP compliance data from 09/16/2015 through 10/15/2015 which is a total of 30 days  during which time he used his machine 29 days with percent used days greater than 4 hours at 93%, indicating excellent compliance with an average usage of 8 hours and 45 minutes, residual AHI 2.8 per hour, leak low for the 95th percentile at 1.8 L/m on a pressure of 8 cm.    I saw him on 04/11/2014, at which time he reported that sleep maintenance was still an issue from time to time. He was using Ambien as needed. He felt it was not as helpful any longer. He was compliant with treatment. He had no residual symptoms after his recent fall from a ladder.    I reviewed his CPAP compliance data from 09/09/2014 through 10/08/2014 which is a total of 30 days during which time he used his machine every night with percent used days greater than 4 hours of 100%, indicating superb compliance with an average usage of 11 hours and 9 minutes and residual AHI low at 1.7 per hour, leak low with the 95th percentile at 1.4 L/m on a pressure of 8 cm without EPR.   I saw him on 10/12/2013, at which time we talked about his sleep study results from March. We also talked about his compliance data. He reported sleeping much better, feeling much less restless, and less sleepy during the day. Nocturia had improved. He did report some soreness in his left nostril due to  the mask. He had been taking care of his mother's estate who passed away in 07-29-2013. He did not have to use his oxygen at night once he was placed on CPAP therapy. In the interim, he presented to the emergency room on 11/18/2013 after falling off of ladder. He fell while cutting limbs from a tree and fell about 8 feet. He hurt his back and also sustained a laceration of the scrotum.   I reviewed the emergency room records and his imaging test results from 11/18/2013: Dg Thoracic Spine W/swimmers: Negative.   Dg Lumbar Spine Complete: No acute osseous abnormality of the lumbar spine. Dg Pelvis 1-2 Views: Negative.   Ct Head and cervical spine Wo Contrast: 1. No acute intracranial or cervical spine findings. 2. Cervical spondylosis. 3. Small hypodensity in the right external capsule may represent a small remote lacunar infarct. 4. Minimal chronic left maxillary and ethmoid sinusitis.     I reviewed the patient's CPAP compliance data from 12/07/2013 to 01/05/2014, which is a total of 30 days, during which time the patient used CPAP every day. The average usage for all days was 8 hours and 50 minutes. The percent used days greater than 4 hours was 100 %, indicating superb compliance. The residual AHI was 2.3 per hour, indicating an adequate treatment pressure of 8 cwp with EPR of 1. Air leak from the mask was low at 13.4 L per minute at the 95th percentile.  I reviewed his compliance from 03/10/2014 to 04/08/2014 which is a total of 30 days during which time he used his machine every night with 100% compliance. Average usage of 10 hours and 34 minutes, pressure at 8 cm with EPR of 1. Residual AHI low at 1.6 per hour, leak low 8.6 L per minute for the 95th percentile.     I first met him on 08/22/2013, at which time he reported a recent abnormal overnight pulse oximetry test, which showed periods of recurrent desaturations as low as 62%. He reported snoring and apneic pauses while asleep and waking up with a  choking or gasping  sensation. He was started on oxygen and reported sleeping much better since then. He has a Hx of prolonged bronchitis some 4 years ago (after he took the flu shot, he says), and was treated with ABx and steroids. He did not take the flu shot after that. He has been on Ambien or Xanax at night. His urologist has recommended a sleep study. He has nasal congestion, and uses OTC afrin. I requested that he return for a sleep study. He had a split-night sleep study in on 08/29/13: His baseline sleep efficiency was reduced at 80.6% with a latency to sleep of 16 minutes and wake after sleep onset of 14 minutes with moderate sleep fragmentation noted. He had an increased percentage of light stage sleep and absence of slow-wave and REM sleep. He had moderate to loud snoring. His total AHI was 60.7 per hour. Baseline oxygen saturation was only 89% with a nadir of 76%. Time below 88% saturation was 50 minutes and 24 seconds. He was then started on CPAP. Sleep efficiency was markedly increased at 96.7%. He had an improved arousal index. He had a normal percentage of light stage sleep, absence of slow-wave sleep and an increased percentage of REM sleep at 36.5% with a very low REM latency. He was titrated using a medium nasal pillows mask starting at 5 cm with a final pressure of 8 cm and reduction of his AHI to 1.3 events per hour at a pressure with supine REM sleep achieved. The average oxygen saturation with CPAP was 92% with a nadir of 82%. Time below 88% saturation with CPAP was 2 minutes and 19 seconds. I placed him on CPAP therapy after that.   I reviewed his compliance data from 09/06/2013 through 10/11/2013 which is the last 36 days during which time he uses CPAP every night with a percent used days greater than 4 hours of 94%, indicating excellent compliance, average usage was 8 hours and 7 minutes, residual AHI at 2.6 with a very low leak. Pressure is 8 with EPR of 1.   His Past Medical History Is  Significant For: Past Medical History:  Diagnosis Date  . Bronchitis   . DJD (degenerative joint disease)   . Glaucoma   . History of tobacco abuse 10/23/2011   History of smoking for 10 years, a pack per day, quitting at age 72.  . IBS (irritable bowel syndrome)   . Leukopenia 10/23/2011   Decrease in lymphocytes dating back to 1995.  . PUD (peptic ulcer disease) Jan 2017   endoscopy  . Renal calculi   . Sleep apnea    on C-pap    His Past Surgical History Is Significant For: Past Surgical History:  Procedure Laterality Date  . APPENDECTOMY    . CARDIAC CATHETERIZATION N/A 11/27/2015   Procedure: Left Heart Cath and Coronary Angiography;  Surgeon: Belva Crome, MD;  Location: [facility];  Service: Cardiovascular;  Laterality: N/A;  . COLONOSCOPY N/A 09/01/2012   Procedure: COLONOSCOPY;  Surgeon: [name], MD;  Location: [facility];  Service: Endoscopy;  Laterality: N/A;  1030  . CYSTOGRAPHY    . ESOPHAGOGASTRODUODENOSCOPY N/A 07/12/2015   Procedure: ESOPHAGOGASTRODUODENOSCOPY (EGD);  Surgeon: [name], MD;  Location: [facility];  Service: Endoscopy;  Laterality: N/A;  2:50 - moved to 1:00 - Ann to notify  . REFRACTIVE SURGERY     "to relieve glaucoma"  . ROTATOR CUFF REPAIR     bilateral shoulders  . SP FL  GUIDE SPINAL INJ  May 2017    His Family History Is Significant For: Family History  Problem Relation Age of Onset  . Breast cancer Mother   . Hypertension Mother   . Kidney Stones Brother     His Social History Is Significant For: Social History   Socioeconomic History  . Marital status: Divorced    Spouse name: Not on file  . Number of children: 1  . Years of education: college  . Highest education level: Not on file  Occupational History  . Occupation: retired  Scientific laboratory technician  . Financial resource strain: Not on file  . Food insecurity    Worry: Not on file    Inability: Not on file  . Transportation needs    Medical: Not on file     Non-medical: Not on file  Tobacco Use  . Smoking status: Former Smoker    Packs/day: 2.00    Years: 15.00    Pack years: 30.00    Types: Cigarettes    Quit date: 07/01/1983    Years since quitting: 35.8  . Smokeless tobacco: Never Used  Substance and Sexual Activity  . Alcohol use: Yes    Alcohol/week: 0.0 standard drinks    Comment: occasional  . Drug use: No  . Sexual activity: Not on file  Lifestyle  . Physical activity    Days per week: Not on file    Minutes per session: Not on file  . Stress: Not on file  Relationships  . Social Herbalist on phone: Not on file    Gets together: Not on file    Attends religious service: Not on file    Active member of club or organization: Not on file    Attends meetings of clubs or organizations: Not on file    Relationship status: Not on file  Other Topics Concern  . Not on file  Social History Narrative  . Not on file    His Allergies Are:  Allergies  Allergen Reactions  . Demeclocycline Rash  :   His Current Medications Are:  Outpatient Encounter Medications as of 05/02/2019  Medication Sig  . ALPRAZolam (XANAX) 0.25 MG tablet Take 1 tablet (0.25 mg total) by mouth 3 (three) times daily as needed for anxiety.  Marland Kitchen aspirin EC 81 MG tablet Take 81 mg by mouth daily.  Marland Kitchen atorvastatin (LIPITOR) 20 MG tablet Take 1 tablet by mouth daily.  . busPIRone (BUSPAR) 10 MG tablet Take 1 tablet by mouth 2 (two) times a day.  . Cholecalciferol (VITAMIN D3) 125 MCG (5000 UT) TABS Take 1 capsule by mouth daily.  . cyclobenzaprine (FLEXERIL) 10 MG tablet Take 1 tablet (10 mg total) by mouth 3 (three) times daily as needed for muscle spasms.  . fluticasone (FLONASE) 50 MCG/ACT nasal spray INHALE 2 SPRAYS IN EACH NOSTRIL ONCE DAILY.  Marland Kitchen gabapentin (NEURONTIN) 300 MG capsule Take 300-600 mg by mouth 3 (three) times daily. Patient takes 1 capsule in the morning, 1 capsule in the evening, and 2 capsules at night  . Ipratropium-Albuterol  (COMBIVENT RESPIMAT) 20-100 MCG/ACT AERS respimat Inhale 2 puffs into the lungs every 6 (six) hours as needed for wheezing or shortness of breath.  . latanoprost (XALATAN) 0.005 % ophthalmic solution Place 1 drop into both eyes at bedtime.  . mupirocin ointment (BACTROBAN) 2 % Place 1 application into the nose at bedtime. For dryness from CPAP  . Oxycodone HCl 10 MG TABS Take 10 mg by  mouth.  . pantoprazole (PROTONIX) 40 MG tablet TAKE (1) TABLET BY MOUTH TWICE DAILY BEFORE MEALS (BREAKFAST AND SUPPER).  Vladimir Faster Glycol-Propyl Glycol (SYSTANE OP) Place 1 drop into both eyes daily as needed (seasonal allergies/ dry eyes).  . Probiotic Product (PROBIOTIC PO) Take 1 tablet by mouth daily.  . tamsulosin (FLOMAX) 0.4 MG CAPS capsule Take 0.4 mg by mouth daily after breakfast.   . venlafaxine XR (EFFEXOR-XR) 75 MG 24 hr capsule TAKE (1) CAPSULE BY MOUTH EVERY DAY WITH BREAKFAST.  Marland Kitchen zolpidem (AMBIEN) 10 MG tablet Take 10 mg by mouth at bedtime as needed for sleep.   No facility-administered encounter medications on file as of 05/02/2019.   :  Review of Systems:  Out of a complete 14 point review of systems, all are reviewed and negative with the exception of these symptoms as listed below:  Review of Systems  Neurological:       Pt presents today to discuss his sleep. Pt reports that his cpap is not working correctly. He wants refills on ambien. He was hospitalized for COVID this summer and just got out of rehab.    Objective:  Neurological Exam  Physical Exam Physical Examination:   Vitals:   05/02/19 1126  BP: (!) 189/98  Pulse: 67  Temp: 98.2 F (36.8 C)    General Examination: The patient is a very pleasant 69 y.o. male in no acute distress. He appears well-developed and well-nourished and well groomed. He has a mildly flushed appearance.   HEENT:Normocephalic, atraumatic, pupils are equal, round and reactive to light and accommodation.Correctiveeye glasses in  place.Extraocular tracking is good without limitation to gaze excursion or nystagmus noted. Normal smooth pursuit is noted. Hearing is grossly intact. Face is symmetric with normal facial animation and normal facial sensation. Speech is clear with no dysarthria noted. There is no hypophonia. There is no lip, neck/head, jaw or voice tremor. Neck shows FROM. Oropharynx exam reveals: Mild mouth dryness. Adequate dental hygiene and moderate airway crowding.Mallampati is class II. Tongue protrudes centrally and palate elevates symmetrically.   Chest:Clear to auscultation without wheezing, rhonchi or crackles noted.  Heart:S1+S2+0, regular and normal without murmurs, rubs or gallops noted.   Abdomen:Soft, non-tender and non-distended with normal bowel sounds appreciated on auscultation.  Extremities:There is no pitting edema in the distal lower extremities bilaterally.   Skin: Warm and dry without trophic changes noted.   Musculoskeletal: exam reveals no obvious joint deformities, tenderness or joint swelling or erythema.   Neurologically:  Mental status: The patient is awake, alert and oriented in all 4 spheres. His immediate and remote memory, attention, language skills and fund of knowledge are appropriate. There is no evidence of aphasia, agnosia, apraxia or anomia. Speech is clear with normal prosody and enunciation. Thought process is linear. Mood is normal and affect is normal.  Cranial nerves II - XII are as described above under HEENT exam.  Motor exam: Normal bulk, strength and tone is noted. There is no drift, tremor or rebound. Fine motor skills and coordination: intact in the UEs and LEs.  Sensory exam: intact to light touch in the upper and lower extremities.  Gait, station and balance: He stands easily. No veering to one side is noted. No leaning to one side is noted. Posture is age-appropriate and stance is narrow based. Gait shows normal stride length and normal pace. No  problems turning are noted.  Assessment and Plan:   In summary, DAXTON NYDAM is a very pleasant 68 year old  male with an underlying medical history of depression and anxiety, recurrent UTIs, remote Hx of smoking, mild obesity, nephrolithiasis,arthritis, back pain, On chronic narcotic pain medication and COVID-19 infection with pneumonia and hypoxia in June 2020 and readmission in July 2020, who presents for follow-up consultation of his obstructive sleep apnea, established on CPAP therapy. He is getting back on treatment. Thankfully, he was able to come home from rehab about 2 weeks ago.  He is recuperating. He is commended for his treatment adherence and advised to talk to his primary care physician about his Requested prescriptions of Xanax and Ambien.  His Ambien has been maintained by his primary care physician, Xanax prescription is new as I understand since his hospitalization.  He is advised that the combination of benzodiazepine medication as well as narcotic pain medication is considered high risk.  Since he has residual anxiety, he is advised to talk to his primary care physician about potentially seeing a psychiatrist for ongoing management of anxiety. From my end of things, I will prescribe a new CPAP machine.  He should be eligible for a new machine.  He is advised that he will need to follow-up within 90 days after starting any new equipment.  He is advised to follow-up with one of our nurse practitioners in the next 3 months and hopefully he can be seen yearly thereafter. Of note, he had a split-night sleep study in March 2015. I answered all his questions today and he was in agreement. I spent 20 minutes in total face-to-face time with the patient, more than 50% of which was spent in counseling and coordination of care, reviewing test results, reviewing medication and discussing or reviewing the diagnosis of OSA, its prognosis and treatment options. Pertinent laboratory and imaging test  results that were available during this visit with the patient were reviewed by me and considered in my medical decision making (see chart for details).

## 2019-05-02 NOTE — Patient Instructions (Signed)
I am glad to see that you are doing better after your COVID-19 infection in June.  You are back on your CPAP, I will write for a new machine as you are seen from what I can see.  Please follow-up in 3 months with a nurse practitioner for your compliance.  As far as the Ambien prescription, you have been receiving the Ambien through your primary care physician.  I have only covered it 1 times about 6 months ago as your primary care physician's office was closed for COVID-19.  As far as the Xanax prescription, please talk to your primary care physician about using Xanax, you may benefit from a referral to psychiatry for anxiety management. Please talk to your primary care physician about this.

## 2019-05-09 ENCOUNTER — Other Ambulatory Visit: Payer: Self-pay

## 2019-05-09 NOTE — Telephone Encounter (Signed)
Received this notice from [facility]: "We are working on this patient's replacement CPAP but his insurance will not cover it at this time due to documented non-compliance at his last office visit.    At this time, he will need to use his CPAP compliantly for the next 30 days then come back in for another office visit, download and documentation that he is using and benefiting from PAP therapy.   Can you message [place] back once that has been done so we can move forward with his replacement unit?"  I called pt to discuss. No answer, left a message asking him to call me back.

## 2019-05-12 MED ORDER — VENLAFAXINE HCL ER 75 MG PO CP24: ORAL_CAPSULE | ORAL | 5 refills | Status: DC

## 2019-05-12 NOTE — Telephone Encounter (Signed)
Pt returned my call and I explained this to him. He is agreeable to using his cpap compliantly. An appt was scheduled with Dr. Rexene Alberts on 06/16/19 at 1:00pm. (no NP availability) Pt is asking for a refill on his effexor.

## 2019-06-16 ENCOUNTER — Other Ambulatory Visit: Payer: Self-pay

## 2019-06-16 ENCOUNTER — Ambulatory Visit (INDEPENDENT_AMBULATORY_CARE_PROVIDER_SITE_OTHER): Payer: Medicare Other | Admitting: Neurology

## 2019-06-16 ENCOUNTER — Encounter: Payer: Self-pay | Admitting: Neurology

## 2019-06-16 VITALS — BP 150/86 | HR 102 | Ht 71.0 in | Wt 187.0 lb

## 2019-06-16 DIAGNOSIS — Z9989 Dependence on other enabling machines and devices: Secondary | ICD-10-CM

## 2019-06-16 DIAGNOSIS — G4733 Obstructive sleep apnea (adult) (pediatric): Secondary | ICD-10-CM

## 2019-06-16 NOTE — Progress Notes (Signed)
Subjective:    Patient ID: OTHER ATIENZA is a 68 y.o. male.  HPI     Interim history:   Joel Greene is a 68 year old right-handed gentleman with an underlying medical history of anxiety, recurrent UTIs, remote Hx of smoking, mild obesity, and nephrolithiasis, who presents for followup consultation of his obstructive sleep apnea, treated with CPAP. He is unaccompanied today.  I last saw him on 05/02/2019, at which time he had recently restarted his CPAP.  He had been hospitalized for COVID-19.  He had residual anxiety.  He was advised to continue to be fully compliant with CPAP therapy, he was eligible for a new machine and prescribed a new CPAP machine as his original machine was from 2015.  He needed an interim appointment for compliance recheck.   Today, 06/16/2019: I reviewed his CPAP compliance data from 05/16/2019 through 06/14/2019 which is a total of 30 days, during which time he used his machine every night with percent use days greater than 4 hours at 90%, indicating excellent compliance with an average usage of 8 hours and 17 minutes, residual AHI 4.6/h, leak acceptable with a 95th percentile at 11.6 L/min on a pressure of 8 cm.   He reports feeling about the same.  He is compliant with his CPAP.  He is motivated to continue with treatment.  He denies any shortness of breath or chest pain.  The patient's allergies, current medications, family history, past medical history, past social history, past surgical history and problem list were reviewed and updated as appropriate.    Previously (copied from previous notes for reference):    I reviewed his CPAP compliance data for the past 30 days from 03/29/2019 through 04/27/2019, during which time he used his machine 28 days with percent use days greater than 4 hours at 63%, indicating mildly suboptimal compliance, average usage of 6 hours and 9 minutes, residual AHI mildly elevated at 8.4, leak on the higher end with a 95th percentile at 16.2  L/min on a pressure of 8 cm.  In the past 90 days his compliance was less because of larger gaps in treatment.   I saw him on 04/27/2018, at which time he reported that his CPAP machine was not working.  He did eventually get some help from a [facility] in [place].  He was using Flonase as needed.  He continued to be on Ambien 10 mg generic per PCP.     In the interim, in April 2020 he requested a refill on the Ambien since his primary care physician's office was closed due to the virus pandemic.  I provided a one-time refill on the Ambien.     Unfortunately, he developed COVID-19 related problems including pneumonia.  He was hospitalized in June for fever and shortness of breath, admitted from 12/18/2018 through 01/13/2019.  He was readmitted for hypoxia on 01/24/2019 and discharged on 01/26/2019, he was admitted from [facility] and discharged back to his skilled nursing facility for rehab.    I saw him on 04/23/2017, at which time he was compliant with CPAP. He was supposed to switch from Ambien CR to regular generic Ambien due to an insurance change. He was endorsing some stress and anxiety. He had been on Effexor long-acting. He had a prescription for gabapentin and Flexeril through his spine surgeon. I increased his Effexor long-acting to 75 mg once daily.   I reviewed his CPAP compliance data from 03/27/2018 through 04/25/2018 which is a total of 30 days,  during which time he used his CPAP only 14 days with percent used days greater than 4 hours at 40%, indicating suboptimal compliance with an average usage of 6 hours and 54 minutes, residual AHI 3.4 per hour, leak acceptable with the 95th percentile at 12.7 L/m on a pressure of 8 cm. In the past 90 days his CPAP usage was 26 out of 90 days with percent used days greater than 4 hours at only 24%.    I saw him on 04/23/2016, at which time he reported doing well. He had started Effexor which had helped. Stress level was better. He was still taking  Ambien CR as needed, up to 4 times a week. He had recently forgotten to take his CPAP machine on a trip but otherwise was compliant with treatment. He had a recent workup for chest pain, negative for heart attack. I suggested a one-year follow-up appointment for sleep apnea.   I reviewed his CPAP compliance data from 03/23/2017 through 04/21/2017 which is a total of 30 days, during which time he used his CPAP 29 days with percent used days greater than 4 hours at 87%, indicating very good compliance with an average usage of 6 hours and 50 minutes, residual AHI 2.9 per hour, leak low with the 95th percentile at 4.6 L/m on a pressure of 8 cm.    I saw him on 10/17/2015, at which time he reported ongoing issues with sleep maintenance. He was compliant with CPAP therapy. [name] did not help and was also no longer covered by his insurance. He was on generic Ambien per primary care physician and had tried Ambien CR in the past. I did not recommend trazodone or temazepam or Ambien. He requested to try Ambien CR again and I prescribed this in generic form. He was having some issues with depression. He had had side effects with Lexapro in the past including insomnia. I suggested low-dose long-acting Effexor at 37.5 mg daily.    I reviewed his CPAP compliance data from 03/23/2016 through 04/21/2016 which is a total of 30 days, during which time he used his CPAP only 19 days with percent used days greater than 4 hours at 60% only, indicating suboptimal compliance with an average usage of 5 hours and 15 minutes, residual AHI 2.7 per hour, leaked low with the 95th percentile at 4.7 L/m on a pressure of 8 cm with EPR of 1.    I saw him on 10/10/2014, at which time he reported that he recently had rotator cuff surgery on the right, December 2015. He also saw Dr. Glenna Fellows for cervical spine disease. He was supposed to see Dr. Francesco Runner for neck injections. He was also referred to a hand surgeon for right hand Dupuytren's  contracture. As far as his sleep is concerned he was compliant with treatment with CPAP and continued to endorse good results in fact felt he could not sleep without it. For sleep maintenance and sleep initiation issues we had tried sonata and he requested a refill. He was using it about 4 times a week. He had to change his DME company after his insurance changed after he turned 43. He had some neck pain with radiation to the right.    I reviewed his CPAP compliance data from 09/16/2015 through 10/15/2015 which is a total of 30 days during which time he used his machine 29 days with percent used days greater than 4 hours at 93%, indicating excellent compliance with an average usage of 8 hours  and 45 minutes, residual AHI 2.8 per hour, leak low for the 95th percentile at 1.8 L/m on a pressure of 8 cm.    I saw him on 04/11/2014, at which time he reported that sleep maintenance was still an issue from time to time. He was using Ambien as needed. He felt it was not as helpful any longer. He was compliant with treatment. He had no residual symptoms after his recent fall from a ladder.    I reviewed his CPAP compliance data from 09/09/2014 through 10/08/2014 which is a total of 30 days during which time he used his machine every night with percent used days greater than 4 hours of 100%, indicating superb compliance with an average usage of 11 hours and 9 minutes and residual AHI low at 1.7 per hour, leak low with the 95th percentile at 1.4 L/m on a pressure of 8 cm without EPR.   I saw him on 10/12/2013, at which time we talked about his sleep study results from March. We also talked about his compliance data. He reported sleeping much better, feeling much less restless, and less sleepy during the day. Nocturia had improved. He did report some soreness in his left nostril due to the mask. He had been taking care of his mother's estate who passed away in 08/17/13. He did not have to use his oxygen at night  once he was placed on CPAP therapy. In the interim, he presented to the emergency room on 11/18/2013 after falling off of ladder. He fell while cutting limbs from a tree and fell about 8 feet. He hurt his back and also sustained a laceration of the scrotum.   I reviewed the emergency room records and his imaging test results from 11/18/2013: Dg Thoracic Spine W/swimmers: Negative.   Dg Lumbar Spine Complete: No acute osseous abnormality of the lumbar spine. Dg Pelvis 1-2 Views: Negative.   Ct Head and cervical spine Wo Contrast: 1. No acute intracranial or cervical spine findings. 2. Cervical spondylosis. 3. Small hypodensity in the right external capsule may represent a small remote lacunar infarct. 4. Minimal chronic left maxillary and ethmoid sinusitis.     I reviewed the patient's CPAP compliance data from 12/07/2013 to 01/05/2014, which is a total of 30 days, during which time the patient used CPAP every day. The average usage for all days was 8 hours and 50 minutes. The percent used days greater than 4 hours was 100 %, indicating superb compliance. The residual AHI was 2.3 per hour, indicating an adequate treatment pressure of 8 cwp with EPR of 1. Air leak from the mask was low at 13.4 L per minute at the 95th percentile.  I reviewed his compliance from 03/10/2014 to 04/08/2014 which is a total of 30 days during which time he used his machine every night with 100% compliance. Average usage of 10 hours and 34 minutes, pressure at 8 cm with EPR of 1. Residual AHI low at 1.6 per hour, leak low 8.6 L per minute for the 95th percentile.     I first met him on 08/22/2013, at which time he reported a recent abnormal overnight pulse oximetry test, which showed periods of recurrent desaturations as low as 62%. He reported snoring and apneic pauses while asleep and waking up with a choking or gasping sensation. He was started on oxygen and reported sleeping much better since then. He has a Hx of prolonged  bronchitis some 4 years ago (after he took the flu  shot, he says), and was treated with ABx and steroids. He did not take the flu shot after that. He has been on Ambien or Xanax at night. His urologist has recommended a sleep study. He has nasal congestion, and uses OTC afrin. I requested that he return for a sleep study. He had a split-night sleep study in on 08/29/13: His baseline sleep efficiency was reduced at 80.6% with a latency to sleep of 16 minutes and wake after sleep onset of 14 minutes with moderate sleep fragmentation noted. He had an increased percentage of light stage sleep and absence of slow-wave and REM sleep. He had moderate to loud snoring. His total AHI was 60.7 per hour. Baseline oxygen saturation was only 89% with a nadir of 76%. Time below 88% saturation was 50 minutes and 24 seconds. He was then started on CPAP. Sleep efficiency was markedly increased at 96.7%. He had an improved arousal index. He had a normal percentage of light stage sleep, absence of slow-wave sleep and an increased percentage of REM sleep at 36.5% with a very low REM latency. He was titrated using a medium nasal pillows mask starting at 5 cm with a final pressure of 8 cm and reduction of his AHI to 1.3 events per hour at a pressure with supine REM sleep achieved. The average oxygen saturation with CPAP was 92% with a nadir of 82%. Time below 88% saturation with CPAP was 2 minutes and 19 seconds. I placed him on CPAP therapy after that.   I reviewed his compliance data from 09/06/2013 through 10/11/2013 which is the last 36 days during which time he uses CPAP every night with a percent used days greater than 4 hours of 94%, indicating excellent compliance, average usage was 8 hours and 7 minutes, residual AHI at 2.6 with a very low leak. Pressure is 8 with EPR of 1.   His Past Medical History Is Significant For: Past Medical History:  Diagnosis Date  . Bronchitis   . DJD (degenerative joint disease)   . Glaucoma    . History of tobacco abuse 10/23/2011   History of smoking for 10 years, a pack per day, quitting at age 70.  . IBS (irritable bowel syndrome)   . Leukopenia 10/23/2011   Decrease in lymphocytes dating back to 1995.  . PUD (peptic ulcer disease) Jan 2017   endoscopy  . Renal calculi   . Sleep apnea    on C-pap    His Past Surgical History Is Significant For: Past Surgical History:  Procedure Laterality Date  . APPENDECTOMY    . CARDIAC CATHETERIZATION N/A 11/27/2015   Procedure: Left Heart Cath and Coronary Angiography;  Surgeon: Belva Crome, MD;  Location: [facility];  Service: Cardiovascular;  Laterality: N/A;  . COLONOSCOPY N/A 09/01/2012   Procedure: COLONOSCOPY;  Surgeon: [name], MD;  Location: [facility];  Service: Endoscopy;  Laterality: N/A;  1030  . CYSTOGRAPHY    . ESOPHAGOGASTRODUODENOSCOPY N/A 07/12/2015   Procedure: ESOPHAGOGASTRODUODENOSCOPY (EGD);  Surgeon: [name], MD;  Location: [facility];  Service: Endoscopy;  Laterality: N/A;  2:50 - moved to 1:00 - Ann to notify  . REFRACTIVE SURGERY     "to relieve glaucoma"  . ROTATOR CUFF REPAIR     bilateral shoulders  . SP FL GUIDE SPINAL INJ  May 2017    His Family History Is Significant For: Family History  Problem Relation Age of Onset  . Breast cancer Mother   .  Hypertension Mother   . Kidney Stones Brother     His Social History Is Significant For: Social History   Socioeconomic History  . Marital status: Divorced    Spouse name: Not on file  . Number of children: 1  . Years of education: college  . Highest education level: Not on file  Occupational History  . Occupation: retired  Tobacco Use  . Smoking status: Former Smoker    Packs/day: 2.00    Years: 15.00    Pack years: 30.00    Types: Cigarettes    Quit date: 07/01/1983    Years since quitting: 35.9  . Smokeless tobacco: Never Used  Substance and Sexual Activity  . Alcohol use: Yes    Alcohol/week: 0.0 standard  drinks    Comment: occasional  . Drug use: No  . Sexual activity: Not on file  Other Topics Concern  . Not on file  Social History Narrative  . Not on file   Social Determinants of Health   Financial Resource Strain:   . Difficulty of Paying Living Expenses: Not on file  Food Insecurity:   . Worried About Charity fundraiser in the Last Year: Not on file  . Ran Out of Food in the Last Year: Not on file  Transportation Needs:   . Lack of Transportation (Medical): Not on file  . Lack of Transportation (Non-Medical): Not on file  Physical Activity:   . Days of Exercise per Week: Not on file  . Minutes of Exercise per Session: Not on file  Stress:   . Feeling of Stress : Not on file  Social Connections:   . Frequency of Communication with Friends and Family: Not on file  . Frequency of Social Gatherings with Friends and Family: Not on file  . Attends Religious Services: Not on file  . Active Member of Clubs or Organizations: Not on file  . Attends Archivist Meetings: Not on file  . Marital Status: Not on file    His Allergies Are:  Allergies  Allergen Reactions  . Demeclocycline Rash  :   His Current Medications Are:  Outpatient Encounter Medications as of 06/16/2019  Medication Sig  . aspirin EC 81 MG tablet Take 81 mg by mouth daily.  Marland Kitchen atorvastatin (LIPITOR) 20 MG tablet Take 1 tablet by mouth daily.  . busPIRone (BUSPAR) 10 MG tablet Take 1 tablet by mouth 2 (two) times a day.  . Cholecalciferol (VITAMIN D3) 125 MCG (5000 UT) TABS Take 1 capsule by mouth daily.  . cyclobenzaprine (FLEXERIL) 10 MG tablet Take 1 tablet (10 mg total) by mouth 3 (three) times daily as needed for muscle spasms.  . fluticasone (FLONASE) 50 MCG/ACT nasal spray INHALE 2 SPRAYS IN EACH NOSTRIL ONCE DAILY.  Marland Kitchen gabapentin (NEURONTIN) 300 MG capsule Take 300-600 mg by mouth 3 (three) times daily. Patient takes 1 capsule in the morning, 1 capsule in the evening, and 2 capsules at night   . Ipratropium-Albuterol (COMBIVENT RESPIMAT) 20-100 MCG/ACT AERS respimat Inhale 2 puffs into the lungs every 6 (six) hours as needed for wheezing or shortness of breath.  . latanoprost (XALATAN) 0.005 % ophthalmic solution Place 1 drop into both eyes at bedtime.  . mupirocin ointment (BACTROBAN) 2 % Place 1 application into the nose at bedtime. For dryness from CPAP  . Oxycodone HCl 10 MG TABS Take 10 mg by mouth.  . pantoprazole (PROTONIX) 40 MG tablet TAKE (1) TABLET BY MOUTH TWICE DAILY BEFORE MEALS (BREAKFAST  AND SUPPER).  Vladimir Faster Glycol-Propyl Glycol (SYSTANE OP) Place 1 drop into both eyes daily as needed (seasonal allergies/ dry eyes).  . Probiotic Product (PROBIOTIC PO) Take 1 tablet by mouth daily.  . tamsulosin (FLOMAX) 0.4 MG CAPS capsule Take 0.4 mg by mouth daily after breakfast.   . venlafaxine XR (EFFEXOR-XR) 75 MG 24 hr capsule TAKE (1) CAPSULE BY MOUTH EVERY DAY WITH BREAKFAST.  Marland Kitchen zolpidem (AMBIEN) 10 MG tablet Take 10 mg by mouth at bedtime as needed for sleep.  . [DISCONTINUED] ALPRAZolam (XANAX) 0.25 MG tablet Take 1 tablet (0.25 mg total) by mouth 3 (three) times daily as needed for anxiety.   No facility-administered encounter medications on file as of 06/16/2019.  :  Review of Systems:  Out of a complete 14 point review of systems, all are reviewed and negative with the exception of these symptoms as listed below:  Review of Systems  Neurological:       Pt presents today to discuss his improved compliance so that he may get a new cpap.    Objective:  Neurological Exam  Physical Exam Physical Examination:   Vitals:   06/16/19 1318  BP: (!) 150/86  Pulse: (!) 102    General Examination: The patient is a very pleasant 68 y.o. male in no acute distress. He appears well-developed and well-nourished and well groomed.   HEENT:Normocephalic, atraumatic, pupils are equal, round and reactive to light, correctiveeye glasses in place.Extraocular tracking is  good without limitation to gaze excursion or nystagmus noted. Normal smooth pursuit is noted. Hearing is grossly intact. Face is symmetric with normal facial animation and normal facial sensation. Speech is clear with no dysarthria noted. There is no hypophonia. There is no lip, neck/head, jaw or voice tremor. Neckshows FROM. Oropharynx exam reveals: moderate mouth dryness. Adequate dental hygiene and moderate airway crowding.Tongue protrudes centrally and palate elevates symmetrically.   Chest:Clear to auscultation without wheezing, rhonchi or crackles noted.  Heart:S1+S2+0, regular and normal without murmurs, rubs or gallops noted.   Abdomen:Soft, non-tender and non-distended with normal bowel sounds appreciated on auscultation.  Extremities:There is no pitting edema in the distal lower extremities bilaterally.   Skin: Warm and dry without trophic changes noted.   Musculoskeletal: exam reveals no obvious joint deformities, tenderness or joint swelling or erythema.  Neurologically:  Mental status: The patient is awake, alert and oriented in all 4 spheres. His immediate and remote memory, attention, language skills and fund of knowledge are appropriate. There is no evidence of aphasia, agnosia, apraxia or anomia. Speech is clear with normal prosody and enunciation. Thought process is linear. Mood is normal and affect is normal.  Cranial nerves II - XII are as described above under HEENT exam.  Motor exam: Normal bulk, strength and tone is noted. There is no drift, tremor or rebound. Fine motor skills and coordination: intact in the UEs and LEs.  Sensory exam: intact to light touch in the upper and lower extremities.  Gait, station and balance: He stands easily. No veering to one side is noted. No leaning to one side is noted. Posture is age-appropriate and stance is narrow based. Gait shows normal stride length and normal pace. No problems turning are noted.  Assessment and Plan:    In summary, Joel Greene is a very pleasant 68 year old male with an underlying medical history of depression and anxiety, recurrent UTIs, remote Hx of smoking, mild obesity, nephrolithiasis,arthritis, back pain, On chronic narcotic pain medication and history of COVID-19 infection  with pneumonia and hypoxia in June 2020 and readmission in July 2020, who presents for follow-up consultation of his obstructive sleep apnea, established on CPAP therapy. He is Compliant with his CPAP. He has benefited from using his CPAP.  He is eligible for a new machine.  I will prescribe a new machine and he is advised to be in touch with his DME company.  He is encouraged to continue to be fully compliant with his CPAP and to be fully compliant with his new machine and follow-up for a compliance recheck in 3 months with a nurse practitioner. Of note, he had a split-night sleep study in March 2015. I answered all his questions today and he was in agreement. I spent 15 minutes in total face-to-face time with the patient, more than 50% of which was spent in counseling and coordination of care, reviewing test results, reviewing medication and discussing or reviewing the diagnosis of OSA, its prognosis and treatment options. Pertinent laboratory and imaging test results that were available during this visit with the patient were reviewed by me and considered in my medical decision making (see chart for details).

## 2019-06-16 NOTE — Progress Notes (Signed)
Order for new cpap sent to [facility] via community message. Confirmation received that the order transmitted was successful.    

## 2019-06-16 NOTE — Patient Instructions (Signed)
Please continue using your CPAP regularly. While your insurance requires that you use CPAP at least 4 hours each night on 70% of the nights, I recommend, that you not skip any nights and use it throughout the night if you can. Getting used to CPAP and staying with the treatment long term does take time and patience and discipline. Untreated obstructive sleep apnea when it is moderate to severe can have an adverse impact on cardiovascular health and raise her risk for heart disease, arrhythmias, hypertension, congestive heart failure, stroke and diabetes. Untreated obstructive sleep apnea causes sleep disruption, nonrestorative sleep, and sleep deprivation. This can have an impact on your day to day functioning and cause daytime sleepiness and impairment of cognitive function, memory loss, mood disturbance, and problems focussing. Using CPAP regularly can improve these symptoms.  We will send your new order to your DME company.  Please follow-up in 3 months with the nurse practitioner for a compliance recheck.

## 2019-07-25 ENCOUNTER — Telehealth: Payer: Self-pay

## 2019-07-25 NOTE — Telephone Encounter (Signed)
rina with [employer] called to check on orders.   Please follow up.

## 2019-07-25 NOTE — Telephone Encounter (Signed)
I reached out to the [employer] and spoke with Ryine. He sts there was a go script order that was sent to our office to be signed.  I had Alric Seton, RN look this up for me since I do not have access to go scripts at this time. She did not see an order listed in go scripts for Mr. Holverson.  I am going to send a message to Jonn Shingles in regards to this.

## 2019-08-03 ENCOUNTER — Ambulatory Visit: Payer: Medicare Other | Admitting: Family Medicine

## 2019-08-10 ENCOUNTER — Ambulatory Visit: Payer: Medicare Other | Admitting: Family Medicine

## 2019-08-10 NOTE — Telephone Encounter (Signed)
This request was in regards to go script order.  I have sent the go script order since I no have access to this platform.

## 2019-08-10 NOTE — Telephone Encounter (Signed)
[facility] has called for Myriam Jacobson, RN please call Rina back at [phone]

## 2019-11-21 DIAGNOSIS — M1991 Primary osteoarthritis, unspecified site: Secondary | ICD-10-CM | POA: Diagnosis not present

## 2019-11-21 DIAGNOSIS — M720 Palmar fascial fibromatosis [Dupuytren]: Secondary | ICD-10-CM | POA: Diagnosis not present

## 2019-11-21 DIAGNOSIS — Z681 Body mass index (BMI) 19 or less, adult: Secondary | ICD-10-CM | POA: Diagnosis not present

## 2019-11-21 DIAGNOSIS — F419 Anxiety disorder, unspecified: Secondary | ICD-10-CM | POA: Diagnosis not present

## 2019-11-21 DIAGNOSIS — G894 Chronic pain syndrome: Secondary | ICD-10-CM | POA: Diagnosis not present

## 2019-11-21 DIAGNOSIS — F329 Major depressive disorder, single episode, unspecified: Secondary | ICD-10-CM | POA: Diagnosis not present

## 2020-01-28 ENCOUNTER — Other Ambulatory Visit: Payer: Self-pay | Admitting: Neurology

## 2020-02-04 IMAGING — CT CT CHEST WITHOUT CONTRAST
1 of 3 series · 14 of 31 positions shown, 18 images · non-contrast
Comparison: Numerous prior chest radiographs, most recently [facility]

CLINICAL DATA: Hospitalization for [id number]/respiratory failure,
hospitalization complicated by superimposed Streptococcus pneumonia
and bacteremia

EXAM:
CT CHEST WITHOUT CONTRAST
TECHNIQUE: Multidetector CT imaging of the chest was performed following the
standard protocol without IV contrast.

[Series 3: (person_name) thins · axial · 0.70mm/px · z∈[+914,+1151]mm · 14 of 375 slices shown, 18 images]
[im 18/375  mediastinal]
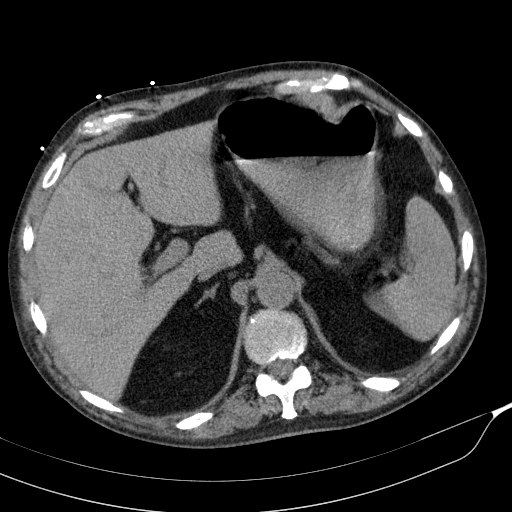
[im 18/375  lung]
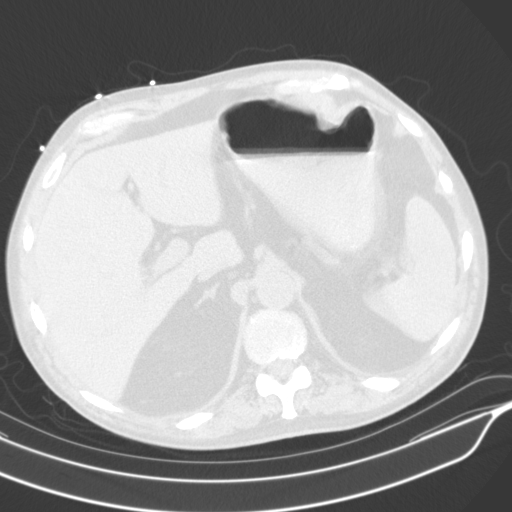
[im 54/375  lung]
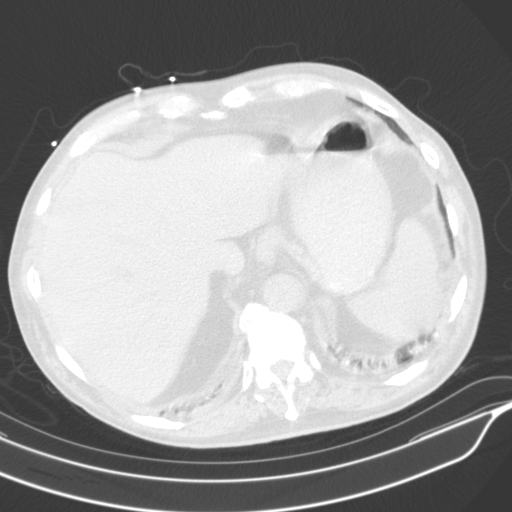
[im 72/375  lung]
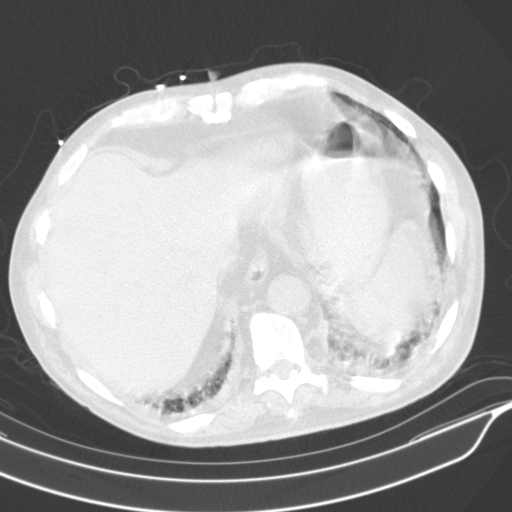
[im 107/375  lung]
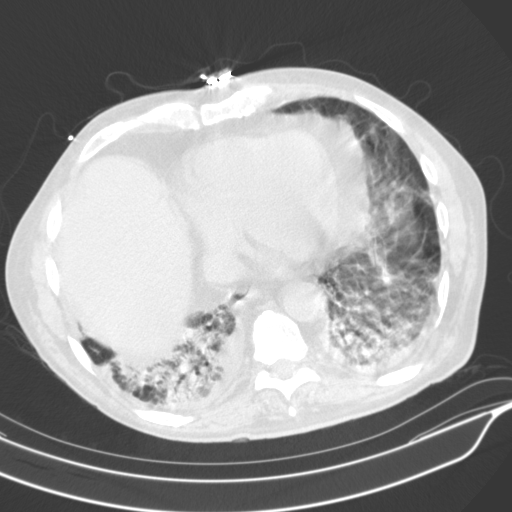
[im 125/375  mediastinal]
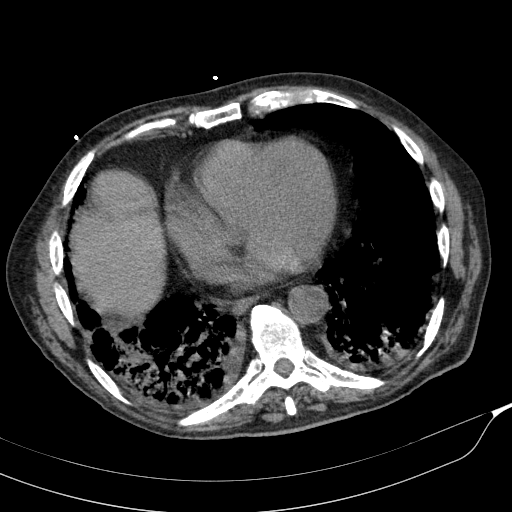
[im 125/375  lung]
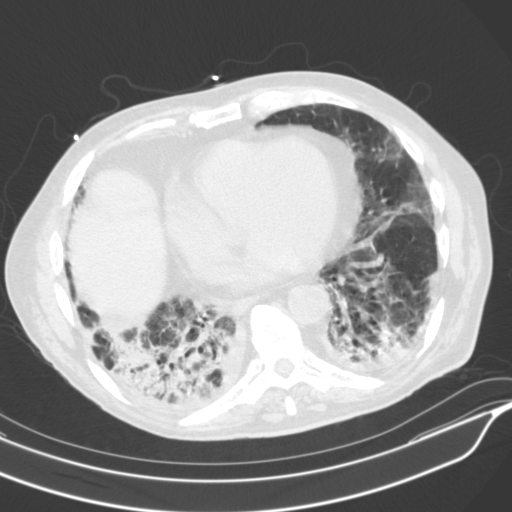
[im 161/375  lung]
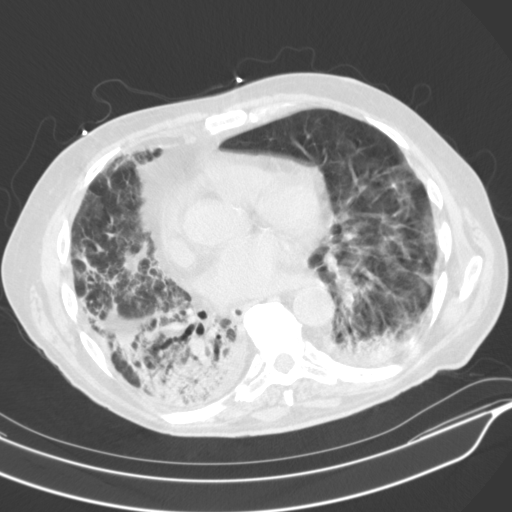
[im 174/375  lung]
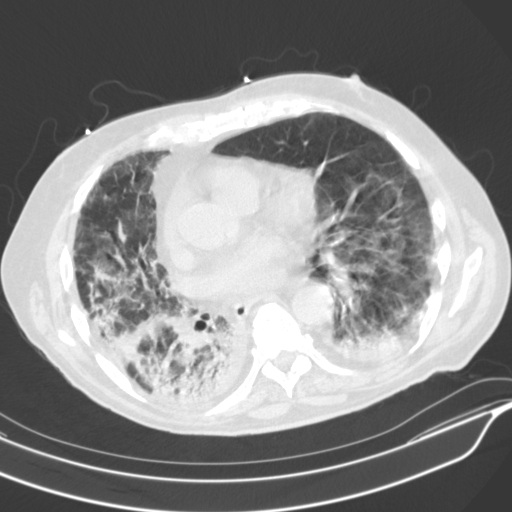
[im 196/375  lung]
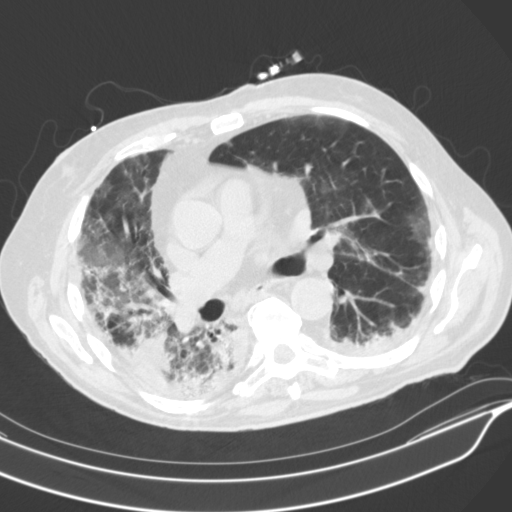
[im 214/375  mediastinal]
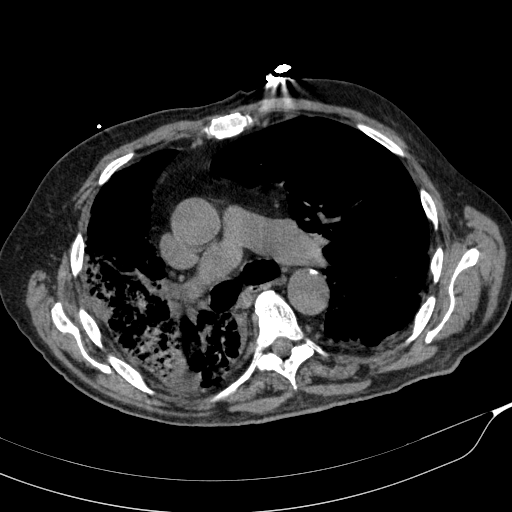
[im 214/375  lung]
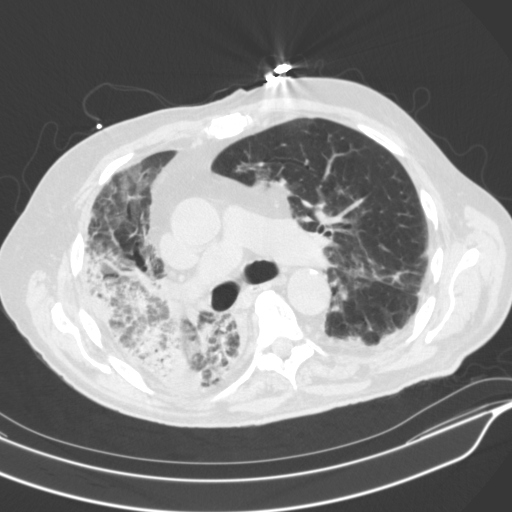
[im 250/375  lung]
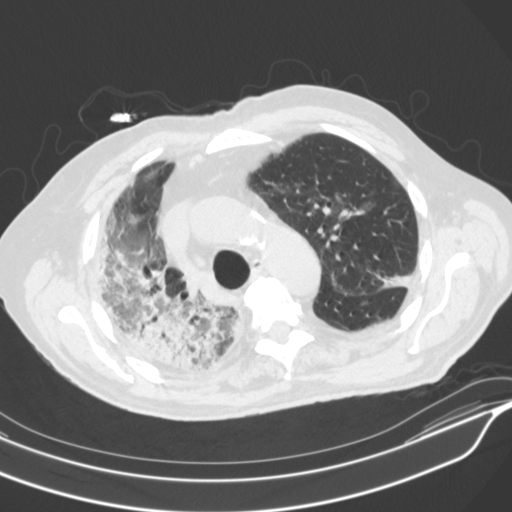
[im 268/375  lung]
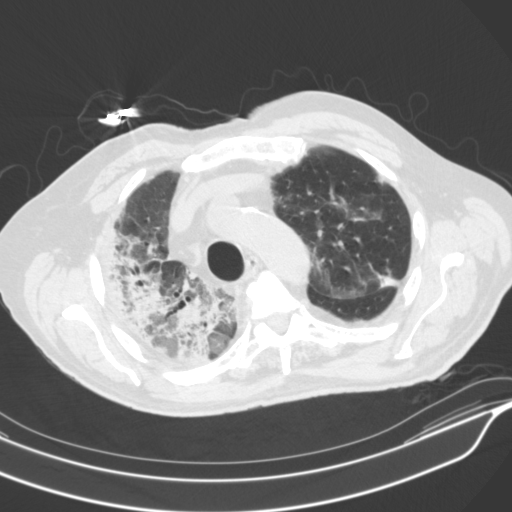
[im 303/375  lung]
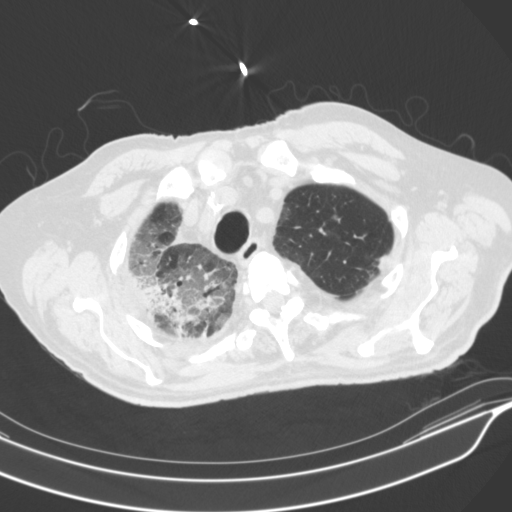
[im 321/375  mediastinal]
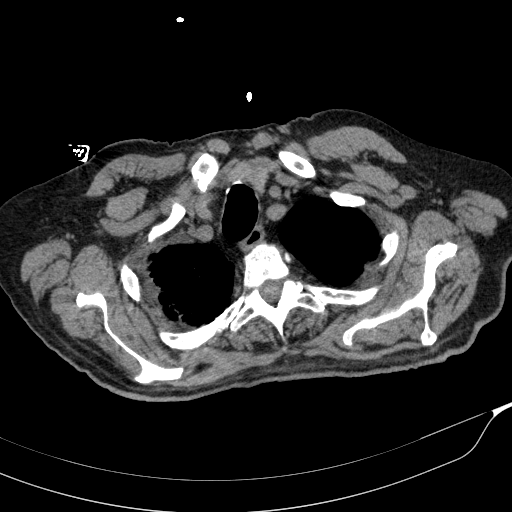
[im 321/375  lung]
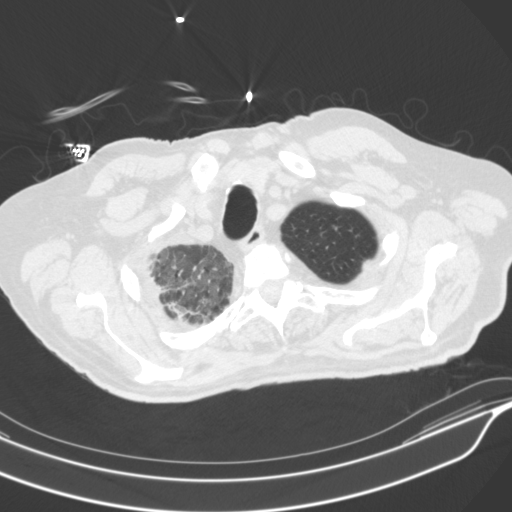
[im 357/375  lung]
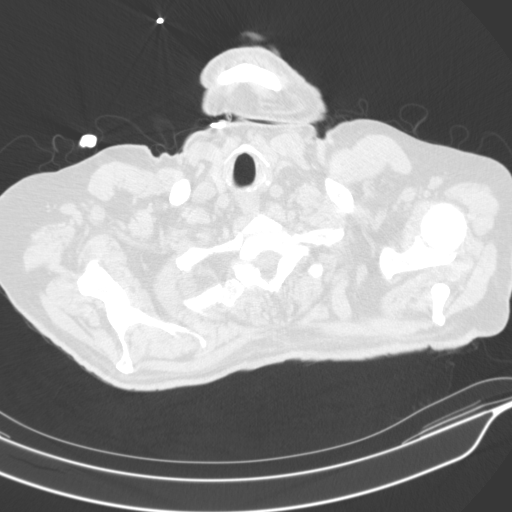

[14 of 31 positions shown; findings below may reference images not displayed]

FINDINGS: Cardiovascular: Coronary calcifications and calcification of the
aortic valve leaflets. Cardiac size is normal. Mild central
pulmonary arterial enlargement.

Mediastinum/Nodes: Few reactive appearing low-attenuation
mediastinal and hilar nodes. Thyroid gland, trachea and esophagus
demonstrate no significant abnormality.

Lungs/Pleura: Diffuse airways thickening and scattered secretions.
There are scattered peripheral areas of ground-glass opacity
throughout both lungs. More focal consolidation is present in the
right middle and lower lobe and posterior basal segment of the left
lower lobe. Bronchiectatic changes are present of the right lower
lobe airways. Superimposed areas of volume loss in both lung bases.

Upper Abdomen: Mild nonspecific stranding seen along the superior
aspect of the right kidney.

Musculoskeletal: Flowing anterior vertebral body osteophytosis
compatible with diffuse idiopathic skeletal hyperostosis. No acute
osseous abnormality or suspicious osseous lesion. 01 soft tissues of
the chest wall are unremarkable.
IMPRESSION: Findings compatible with patient's history of viral pneumonia with
bacterial superinfection. Airspace disease most pronounced in the
right middle and lower lobes. Likely developing architectural
distortion and scarring with bronchiectatic changes the right lower
lobe airways. Reactive mediastinal and hilar adenopathy.

Central pulmonary arterial enlargement may reflect pulmonary
arterial hypertension.

Osseous features compatible with diffuse idiopathic skeletal
hyperostosis.

Mild nonspecific right perinephric stranding, incompletely assessed
on this exam. Correlate with renal function.

Aortic Atherosclerosis ([id number]).

## 2020-03-22 ENCOUNTER — Other Ambulatory Visit: Payer: Self-pay | Admitting: Neurology

## 2020-08-01 DIAGNOSIS — I10 Essential (primary) hypertension: Secondary | ICD-10-CM | POA: Diagnosis not present

## 2020-08-01 DIAGNOSIS — R069 Unspecified abnormalities of breathing: Secondary | ICD-10-CM | POA: Diagnosis not present

## 2020-08-01 DIAGNOSIS — R0902 Hypoxemia: Secondary | ICD-10-CM | POA: Diagnosis not present

## 2020-08-01 DIAGNOSIS — R000 Tachycardia, unspecified: Secondary | ICD-10-CM | POA: Diagnosis not present

## 2020-08-01 DIAGNOSIS — R1111 Vomiting without nausea: Secondary | ICD-10-CM | POA: Diagnosis not present

## 2020-08-20 ENCOUNTER — Other Ambulatory Visit: Payer: Self-pay

## 2020-08-20 ENCOUNTER — Encounter: Payer: Self-pay | Admitting: Internal Medicine

## 2020-08-20 ENCOUNTER — Ambulatory Visit (INDEPENDENT_AMBULATORY_CARE_PROVIDER_SITE_OTHER): Payer: Medicare Other | Admitting: Internal Medicine

## 2020-08-20 VITALS — BP 160/90 | HR 98 | Temp 97.9°F | Ht 71.0 in | Wt 185.0 lb

## 2020-08-20 DIAGNOSIS — Z9989 Dependence on other enabling machines and devices: Secondary | ICD-10-CM

## 2020-08-20 DIAGNOSIS — R600 Localized edema: Secondary | ICD-10-CM

## 2020-08-20 DIAGNOSIS — U071 COVID-19: Secondary | ICD-10-CM

## 2020-08-20 DIAGNOSIS — G4733 Obstructive sleep apnea (adult) (pediatric): Secondary | ICD-10-CM

## 2020-08-20 DIAGNOSIS — J9611 Chronic respiratory failure with hypoxia: Secondary | ICD-10-CM | POA: Diagnosis not present

## 2020-08-20 MED ORDER — ALBUTEROL SULFATE (2.5 MG/3ML) 0.083% IN NEBU: 2.5000 mg | INHALATION_SOLUTION | Freq: Four times a day (QID) | RESPIRATORY_TRACT | 12 refills | Status: DC | PRN

## 2020-08-20 MED ORDER — TRAZODONE HCL 50 MG PO TABS: 50.0000 mg | ORAL_TABLET | Freq: Every day | ORAL | 5 refills | Status: AC

## 2020-08-20 NOTE — Progress Notes (Signed)
Joel Greene    [id number]    1951/03/24  Primary Care Physician:Fusco, Purcell Nails, MD  Referring Physician: Redmond School, [address] [place],  [place] [address] Reason for Consultation: shortness of breath after covid Date of Consultation: 08/20/2020  Chief complaint:   Chief Complaint  Patient presents with  . Consult    Post covid 2020 on ventilator.  On 2L.  Gets sob and sats drop into the 70's.     HPI: Joel Greene is a 70 y.o. gentleman who was hospitalized with covid 13 in july 2020 at [facility], complicated by pneumococcal pneumonia. Was discharged on [facility]. A year ago he was more active and was only  Using oxygen infrequently. He has had decreased activity over the past year  Breathing feels better when he stands up straight, worse when he lays down.  He is often laying on his right side due to having left shoulder pain with a concern for torn rotator cuff.  Has long standing anxiety and has been on and off xanax for years. Has been put on buspar and effexor but feels these aren't helping. Daughter notes anxiety has been very high.  Really wants to know if he can have something for sleep.  He is reliant on Ambien nightly.  He wears a CPAP at night and follows with [facility].  Does not wear this every night.  At home he does not ambulate with oxygen due to the cord being short and not having a portable concentrator.  Without oxygen he is down in the mid 70s.  He does have an albuterol treatments at home.  He thinks the breathing treatments he got in the hospital did help him.  Will refill today.  DME - Engineer, maintenance  Social history:  Occupation: worked in [employer]: lives alone, but is able to do all his daily activity.  Smoking history: passive smoke exposure  Social History   Occupational History  . Occupation: retired  Tobacco Use  . Smoking status: Former Smoker    Packs/day: 0.50    Years: 15.00     Pack years: 7.50    Types: Cigarettes    Quit date: 06/30/1973    Years since quitting: 47.1  . Smokeless tobacco: Never Used  Substance and Sexual Activity  . Alcohol use: Yes    Alcohol/week: 0.0 standard drinks    Comment: occasional  . Drug use: No  . Sexual activity: Not on file    Relevant family history:  Family History  Problem Relation Age of Onset  . Breast cancer Mother   . Hypertension Mother   . Kidney Stones Brother     Past Medical History:  Diagnosis Date  . Bronchitis   . DJD (degenerative joint disease)   . Glaucoma   . History of tobacco abuse 10/23/2011   History of smoking for 10 years, a pack per day, quitting at age 47.  . IBS (irritable bowel syndrome)   . Leukopenia 10/23/2011   Decrease in lymphocytes dating back to 1995.  . PUD (peptic ulcer disease) Jan 2017   endoscopy  . Renal calculi   . Sleep apnea    on C-pap    Past Surgical History:  Procedure Laterality Date  . APPENDECTOMY    . CARDIAC CATHETERIZATION N/A 11/27/2015   Procedure: Left Heart Cath and Coronary Angiography;  Surgeon: Belva Crome, MD;  Location: [facility];  Service:  Cardiovascular;  Laterality: N/A;  . COLONOSCOPY N/A 09/01/2012   Procedure: COLONOSCOPY;  Surgeon: [name], MD;  [facility];  Service: Endoscopy;  Laterality: N/A;  1030  . CYSTOGRAPHY    . ESOPHAGOGASTRODUODENOSCOPY N/A 07/12/2015   Procedure: ESOPHAGOGASTRODUODENOSCOPY (EGD);  Surgeon: [name], MD;  Location: [facility];  Service: Endoscopy;  Laterality: N/A;  2:50 - moved to 1:00 - Ann to notify  . REFRACTIVE SURGERY     "to relieve glaucoma"  . ROTATOR CUFF REPAIR     bilateral shoulders  . SP FL GUIDE SPINAL INJ  May 2017     Physical Exam: Blood pressure (!) 160/90, pulse 98, temperature 97.9 F (36.6 C), temperature source Temporal, height 5\' 11"  (1.803 m), weight 185 lb (83.9 kg), SpO2 95 %. Gen:      No acute distress, chronically ill-appearing ENT:   no nasal polyps, mucus membranes moist, on nasal cannula Lungs:   Diminished, no increased work of breathing, no wheezes or crackles CV:         Regular rate and rhythm; no murmurs, rubs, or gallops.  2+ pitting lower extremity edema Abd:      + bowel sounds; soft, non-tender; no distension MSK: no acute synovitis of DIP or PIP joints, no mechanics hands.  Skin:      Warm and dry; no rashes Neuro: normal speech, no focal facial asymmetry Psych: alert and oriented x3, normal mood and affect   Data Reviewed/Medical Decision Making:  Independent interpretation of tests: Imaging: . Review of patient's CT angio chest from July 2020 images revealed diffuse bilateral groundglass opacities and patchy consolidation consistent with acute COVID-19 infection. The patient's images have been independently reviewed by me.    PFTs:  None on file  Labs:  Lab Results  Component Value Date   WBC 4.1 01/26/2019   HGB 11.6 (L) 01/26/2019   HCT 36.7 (L) 01/26/2019   MCV 100.3 (H) 01/26/2019   PLT 277 01/26/2019   Hemoglobin low at 11.6  Immunization status:  Immunization History  Administered Date(s) Administered  . Pneumococcal Polysaccharide-23 11/26/2015  . Tdap 11/18/2013    . I reviewed prior external note(s) from hospital stay July 2020 . I reviewed the result(s) of the labs and imaging as noted above.  . 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  Needs to follow-up with them. We discussed sleep at length.  He does have anxiety as well as trouble sleeping, and is on Ambien nightly.  He is not taking Effexor or BuSpar anymore.  I recommended against Xanax on a long-term basis for this condition especially in the setting of his  respiratory failure.  For his insomnia can try trazodone 50 mg at night Need to get a repeat CT chest to see what residual fibrosis there may be in his lungs.  We will also get some pulmonary function testing to understand severity. The patient is severely short of breath and profoundly hypoxemic.  We did a repeat walk test today and he requires 4 L nasal cannula.  He is interested Inogen machine which we will was prescribed today as well.  He is to continue albuterol as needed medication.  We will obtain an echocardiogram to further evaluate for heart  failure given his lower extremity edema.  We discussed disease management and progression at length today.   I spent 60plan: M physician office inutes in the care of this patient today including pre-charting, chart review, review of results, face-to-face care, coordination of care and communication with consultants etc.).  A copy of the CT Trying to getReturn to Care: Return in about 4 weeks (around 09/17/2020).  Lenice Llamas, MD Pulmonary [facility] [place]  CC: Redmond School, MD

## 2020-08-20 NOTE — Patient Instructions (Signed)
The patient should have follow up scheduled with myself in 1 months.   Prior to next visit patient should have: Full set of PFTs  Get your CT scan before you come see me.

## 2020-08-22 ENCOUNTER — Telehealth: Payer: Self-pay | Admitting: *Deleted

## 2020-08-22 ENCOUNTER — Other Ambulatory Visit (HOSPITAL_COMMUNITY): Payer: Medicare Other

## 2020-08-22 NOTE — Telephone Encounter (Signed)
Closing encounter d/t patient passing away on 08/30/2020.  Nothing further needed.  PA request was received from (pharmacy):  Phone: Fax:  Medication name and strength: Albuterol Sulfate (2.5 mg/3 ml) 0.083% Nebulizer solution  Ordering Provider: Shearon Stalls  Was PA started with CMM?: yes  If yes, please enter KEY: [id number] Medication tried and failed: none Covered Alternatives: none  PA sent to plan, time frame for approval / denial: [employer] is reviewing your PA request and will respond within 24 hours for Medicaid or up to 72 hours for non-Medicaid plans, based on the required timeframe determined by state or federal regulations. To check for an update later, open this request from your dashboard.  Denied on February 23 CaseId:[id number];Status:Denied;Review Type:Prior Auth;Appeal Information: [place] [id number]. [phone] [phone] [phone] WebAddress:[email or web address]; Important - Please read the below note on eAppeals: Please reference the denial letter for information on the rights for an appeal, rationale for the denial, and how to submit an appeal including if any information is needed to support the appeal. Note about urgent situations - Generally, an urgent situation is one which, in the opinion of the provider, the health of the patient may be in serious jeopardy or may experience pain that cannot be adequately controlled while waiting for a decision on the appeal.;  Routing to [facility] for follow-up

## 2020-08-24 MED ORDER — ALBUTEROL SULFATE (2.5 MG/3ML) 0.083% IN NEBU: 2.5000 mg | INHALATION_SOLUTION | Freq: Four times a day (QID) | RESPIRATORY_TRACT | 12 refills | Status: AC | PRN

## 2020-08-24 NOTE — Telephone Encounter (Signed)
They did not.  I have resent the rx.  We are thinking that since the DX code is not on the rx the insurance is not covering the medication.  We will try and see. Thanks

## 2020-08-24 NOTE — Telephone Encounter (Signed)
PA for the albuterol neb has been denied by insurance.  ND please advise. Thanks

## 2020-08-24 NOTE — Telephone Encounter (Signed)
Did they provide a reason for the denial? Or a formulary alternative?

## 2020-08-28 ENCOUNTER — Telehealth: Payer: Self-pay | Admitting: Internal Medicine

## 2020-08-29 NOTE — Telephone Encounter (Signed)
Called pt's dtr and gave her phone # for Central Scheduling so she can call & reschedule the appt.  Nothing further needed.

## 2020-08-30 ENCOUNTER — Telehealth: Payer: Self-pay | Admitting: Internal Medicine

## 2020-08-30 NOTE — Telephone Encounter (Addendum)
Rescheduled to 3/15 at 2:30 prior to echo at 3:00.  Gave appt info Tosha.  Nothing further needed.

## 2020-08-31 ENCOUNTER — Ambulatory Visit (HOSPITAL_COMMUNITY): Payer: Medicare Other

## 2020-09-04 ENCOUNTER — Telehealth: Payer: Self-pay | Admitting: *Deleted

## 2020-09-04 NOTE — Telephone Encounter (Signed)
Called patient's pharmacy, [facility] in [place], [place] to check on status of nebulizer solution.  I was advised that he had passed away on September 23, 2020.  Nothing further needed.

## 2020-09-11 ENCOUNTER — Ambulatory Visit (HOSPITAL_COMMUNITY): Payer: Medicare Other

## 2020-09-24 ENCOUNTER — Ambulatory Visit: Payer: Medicare Other | Admitting: Internal Medicine

## 2020-09-28 DEATH — deceased
